# Patient Record
Sex: Male | Born: 1955 | Race: Black or African American | Hispanic: No | Marital: Single | Smoking: Current every day smoker
Health system: Southern US, Community
[De-identification: ages and names within clinical notes are randomized; demographics above are authoritative.]

## PROBLEM LIST (undated history)

## (undated) DIAGNOSIS — I739 Peripheral vascular disease, unspecified: Secondary | ICD-10-CM

## (undated) DIAGNOSIS — G40909 Epilepsy, unspecified, not intractable, without status epilepticus: Secondary | ICD-10-CM

## (undated) DIAGNOSIS — E119 Type 2 diabetes mellitus without complications: Secondary | ICD-10-CM

## (undated) DIAGNOSIS — I639 Cerebral infarction, unspecified: Secondary | ICD-10-CM

## (undated) DIAGNOSIS — I10 Essential (primary) hypertension: Secondary | ICD-10-CM

## (undated) DIAGNOSIS — F0390 Unspecified dementia without behavioral disturbance: Secondary | ICD-10-CM

## (undated) HISTORY — DX: Essential (primary) hypertension: I10

## (undated) HISTORY — PX: TONSILLECTOMY: SUR1361

---

## 2021-08-27 ENCOUNTER — Emergency Department (HOSPITAL_COMMUNITY): Payer: Medicare Other

## 2021-08-27 ENCOUNTER — Other Ambulatory Visit: Payer: Self-pay

## 2021-08-27 ENCOUNTER — Emergency Department (HOSPITAL_COMMUNITY)
Admission: EM | Admit: 2021-08-27 | Discharge: 2021-08-27 | Disposition: A | Discharge status: Home / Self Care | Payer: Medicare Other | Attending: Emergency Medicine | Admitting: Emergency Medicine

## 2021-08-27 ENCOUNTER — Emergency Department (HOSPITAL_COMMUNITY)
Admission: EM | Admit: 2021-08-27 | Discharge: 2021-08-28 | Disposition: A | Discharge status: Nursing Home (Includes ICF) | Payer: Medicare Other | Source: Home / Self Care | Attending: Emergency Medicine | Admitting: Emergency Medicine

## 2021-08-27 ENCOUNTER — Encounter (HOSPITAL_COMMUNITY): Payer: Self-pay | Admitting: Emergency Medicine

## 2021-08-27 DIAGNOSIS — M25552 Pain in left hip: Secondary | ICD-10-CM | POA: Insufficient documentation

## 2021-08-27 DIAGNOSIS — Z7982 Long term (current) use of aspirin: Secondary | ICD-10-CM | POA: Diagnosis not present

## 2021-08-27 DIAGNOSIS — I10 Essential (primary) hypertension: Secondary | ICD-10-CM | POA: Insufficient documentation

## 2021-08-27 DIAGNOSIS — Z79899 Other long term (current) drug therapy: Secondary | ICD-10-CM | POA: Insufficient documentation

## 2021-08-27 DIAGNOSIS — S42292A Other displaced fracture of upper end of left humerus, initial encounter for closed fracture: Secondary | ICD-10-CM | POA: Insufficient documentation

## 2021-08-27 DIAGNOSIS — W19XXXA Unspecified fall, initial encounter: Secondary | ICD-10-CM | POA: Diagnosis not present

## 2021-08-27 DIAGNOSIS — S7002XA Contusion of left hip, initial encounter: Secondary | ICD-10-CM

## 2021-08-27 DIAGNOSIS — S42032A Displaced fracture of lateral end of left clavicle, initial encounter for closed fracture: Secondary | ICD-10-CM | POA: Diagnosis not present

## 2021-08-27 DIAGNOSIS — M25512 Pain in left shoulder: Secondary | ICD-10-CM | POA: Insufficient documentation

## 2021-08-27 DIAGNOSIS — S4992XA Unspecified injury of left shoulder and upper arm, initial encounter: Secondary | ICD-10-CM | POA: Diagnosis present

## 2021-08-27 LAB — CBG MONITORING, ED: Glucose-Capillary: 130 mg/dL — ABNORMAL HIGH (ref 70–99)

## 2021-08-27 MED ORDER — TRAMADOL HCL 50 MG PO TABS
50.0000 mg | ORAL_TABLET | Freq: Once | ORAL | Status: AC
  Administered 2021-08-27: 50 mg via ORAL
  Filled 2021-08-27: qty 1

## 2021-08-27 MED ORDER — METOPROLOL SUCCINATE ER 25 MG PO TB24
25.0000 mg | ORAL_TABLET | Freq: Once | ORAL | Status: AC
  Administered 2021-08-27: 25 mg via ORAL
  Filled 2021-08-27: qty 1

## 2021-08-27 MED ORDER — HYDROCODONE-ACETAMINOPHEN 5-325 MG PO TABS
1.0000 | ORAL_TABLET | Freq: Four times a day (QID) | ORAL | 0 refills | Status: DC | PRN
Start: 2021-08-27 — End: 2021-10-16

## 2021-08-27 NOTE — ED Notes (Signed)
Tried calling [facility] [phone]) to get information about baseline cognitive fxn of pt. No answer or voicemail set up

## 2021-08-27 NOTE — ED Provider Notes (Signed)
[facility] EMERGENCY DEPARTMENT Provider Note   CSN: [id number] Arrival date & time: 08/27/21  2226     History  Chief Complaint  Patient presents with   Leg Pain    Left    Jacob Mcgrath. is a 66 y.o. male.  Patient is a 66 year old male sent from [facility] for evaluation of left groin pain.  Patient seen earlier today for a fall.  He was found to have both a humerus and clavicle fracture.  He was placed in an arm sling and returned.  He apparently began experiencing pain in his left hip and groin this evening and was sent for evaluation of this.  Patient is cognitively impaired and offers little additional history.  The history is provided by the patient.      Home Medications Prior to Admission medications   Medication Sig Start Date End Date Taking? Authorizing Provider  ASPIRIN ADULT LOW STRENGTH 81 MG EC tablet Take 81 mg by mouth daily. 08/14/21   [provider]  atorvastatin (LIPITOR) 80 MG tablet Take 80 mg by mouth daily. 08/14/21   [provider]  HYDROcodone-acetaminophen (NORCO/VICODIN) 5-325 MG tablet Take 1 tablet by mouth every 6 (six) hours as needed for moderate pain. 08/27/21   Vanetta Mulders, MD  metoprolol succinate (TOPROL-XL) 25 MG 24 hr tablet Take 25 mg by mouth daily. 08/14/21   [provider]  MUCINEX MAXIMUM STRENGTH 1200 MG TB12 Take 1 tablet by mouth 2 (two) times daily. 08/14/21   [provider]  Multiple Vitamins-Minerals (THERA-M) TABS Take 1 tablet by mouth daily. 08/14/21   [provider]  omeprazole (PRILOSEC) 20 MG capsule Take 20 mg by mouth daily. 08/14/21   [provider]  PARoxetine (PAXIL) 10 MG tablet Take 10 mg by mouth daily. 08/14/21   [provider]  polyethylene glycol powder (GLYCOLAX/MIRALAX) 17 GM/SCOOP powder Take 17 g by mouth daily. 08/14/21   [provider]  QUEtiapine (SEROQUEL) 25 MG tablet Take 25 mg by mouth at bedtime. 08/14/21    [provider]      Allergies    Patient has no known allergies.    Review of Systems   Review of Systems  All other systems reviewed and are negative.  Physical Exam Updated Vital Signs BP 102/66 (BP Location: Right Arm)    Pulse (!) 127    Temp 98.3 F (36.8 C) (Oral)    Resp 17    Ht 5\' 9"  (1.753 m)    Wt 54.4 kg    SpO2 97%    BMI 17.72 kg/m  Physical Exam Vitals and nursing note reviewed.  Constitutional:      General: He is not in acute distress.    Appearance: He is well-developed. He is not diaphoretic.  HENT:     Head: Normocephalic and atraumatic.  Cardiovascular:     Rate and Rhythm: Normal rate and regular rhythm.     Heart sounds: No murmur heard.   No friction rub.  Pulmonary:     Effort: Pulmonary effort is normal. No respiratory distress.     Breath sounds: Normal breath sounds. No wheezing or rales.  Abdominal:     General: Bowel sounds are normal. There is no distension.     Palpations: Abdomen is soft.     Tenderness: There is no abdominal tenderness.  Musculoskeletal:        General: Normal range of motion.     Cervical back: Normal range  of motion and neck supple.     Comments: The left hip appears grossly normal.  It is stable with good range of motion.  Distal PMS is intact.  Patient able to move all extremities.  Skin:    General: Skin is warm and dry.  Neurological:     Mental Status: He is alert and oriented to person, place, and time.     Coordination: Coordination normal.    ED Results / Procedures / Treatments   Labs (all labs ordered are listed, but only abnormal results are displayed) Labs Reviewed  CBG MONITORING, ED - Abnormal; Notable for the following components:      Result Value   Glucose-Capillary 130 (*)    All other components within normal limits    EKG None  Radiology DG Shoulder Left  Result Date: 08/27/2021 CLINICAL DATA:  66 year old male status post fall. EXAM: LEFT SHOULDER - 2+ VIEW COMPARISON:  None.  FINDINGS: Oblique distal left clavicle fracture, involving the coracoclavicular ligament attachment area but sparing the left AC joint which appears intact. Superimposed comminuted minimally displaced fracture of the left humeral neck. And furthermore, bone mineralization in the proximal left humerus shaft is heterogeneous. No glenohumeral joint dislocation. Left scapula appears intact. Visible left ribs and lung appear negative. Calcified aortic atherosclerosis. IMPRESSION: 1. Left humeral neck fracture, comminuted and minimally displaced, superimposed on heterogeneous bone mineralization of the visible left humerus shaft. A Pathologic Fracture is not excluded. 2. Superimposed oblique fracture distal left clavicle, minimally displaced and involving the coracoclavicular ligament area. Electronically Signed   By: Odessa Fleming M.D.   On: 08/27/2021 09:27    Procedures Procedures    Medications Ordered in ED Medications - No data to display  ED Course/ Medical Decision Making/ A&P  Patient sent from his group home for evaluation of a fall.  He was seen earlier today and was found to have a shoulder fracture.  He is now having pain in the left hip.  X-rays are negative and he has good range of motion.  Patient seems appropriate for discharge.  It sounds as though he has a contusion or sprain of the hip.  Final Clinical Impression(s) / ED Diagnoses Final diagnoses:  None    Rx / DC Orders ED Discharge Orders     None         Geoffery Lyons, MD 08/28/21 [phone]

## 2021-08-27 NOTE — Discharge Instructions (Addendum)
Prepack of hydrocodone given here today.  Take that as needed for pain.  Wear shoulder immobilizer.  Call orthopedics for follow-up of your proximal humerus fracture and your collarbone fracture.

## 2021-08-27 NOTE — ED Notes (Signed)
Pt.'s blood pressure 162/136. Pt states he does take blood pressure medicine, but has not taken it today. MD made aware

## 2021-08-27 NOTE — ED Provider Notes (Signed)
[facility] EMERGENCY DEPARTMENT Provider Note   CSN: [id number] Arrival date & time: 08/27/21  [id number]     History  Chief Complaint  Patient presents with   [name] is a 66 y.o. male.  Patient brought in by EMS from [facility] group home with reports of falling yesterday and today.  Patient with complaint of left shoulder pain.  Denies hitting his head.  No evidence of any trauma to the head no neck pain no back pain no lower extremity pain or concerns for injury.  Patient's past medical history not documented.  Not able to get a good history from him.  But based on his medication probably has hypertension gastroesophageal reflux disease and hyperlipidemia.  Since patient is on a statin he is on Toprol-XL and he is on Prilosec.      Home Medications Prior to Admission medications   Medication Sig Start Date End Date Taking? Authorizing Provider  HYDROcodone-acetaminophen (NORCO/VICODIN) 5-325 MG tablet Take 1 tablet by mouth every 6 (six) hours as needed for moderate pain. 08/27/21  Yes Fredia Sorrow, MD  ASPIRIN ADULT LOW STRENGTH 81 MG EC tablet Take 81 mg by mouth daily. 08/14/21   [provider]  atorvastatin (LIPITOR) 80 MG tablet Take 80 mg by mouth daily. 08/14/21   [provider]  metoprolol succinate (TOPROL-XL) 25 MG 24 hr tablet Take 25 mg by mouth daily. 08/14/21   [provider]  Spicer 1200 MG TB12 Take 1 tablet by mouth 2 (two) times daily. 08/14/21   [provider]  Multiple Vitamins-Minerals (THERA-M) TABS Take 1 tablet by mouth daily. 08/14/21   [provider]  omeprazole (PRILOSEC) 20 MG capsule Take 20 mg by mouth daily. 08/14/21   [provider]  PARoxetine (PAXIL) 10 MG tablet Take 10 mg by mouth daily. 08/14/21   [provider]  polyethylene glycol powder (GLYCOLAX/MIRALAX) 17 GM/SCOOP powder Take 17 g by mouth daily. 08/14/21   [provider]  QUEtiapine  (SEROQUEL) 25 MG tablet Take 25 mg by mouth at bedtime. 08/14/21   [provider]      Allergies    Patient has no known allergies.    Review of Systems   Review of Systems  Constitutional:  Negative for chills and fever.  HENT:  Negative for ear pain and sore throat.   Eyes:  Negative for pain and visual disturbance.  Respiratory:  Negative for cough and shortness of breath.   Cardiovascular:  Negative for chest pain and palpitations.  Gastrointestinal:  Negative for abdominal pain and vomiting.  Genitourinary:  Negative for dysuria and hematuria.  Musculoskeletal:  Negative for arthralgias, back pain and neck pain.  Skin:  Negative for color change and rash.  Neurological:  Negative for seizures, syncope and headaches.  All other systems reviewed and are negative.  Physical Exam Updated Vital Signs BP (!) 157/93    Pulse (!) 101    Temp 98.2 F (36.8 C) (Oral)    Resp 14    SpO2 94%  Physical Exam Vitals and nursing note reviewed.  Constitutional:      General: He is not in acute distress.    Appearance: Normal appearance. He is well-developed.  HENT:     Head: Normocephalic and atraumatic.  Eyes:     Extraocular Movements: Extraocular movements intact.     Conjunctiva/sclera: Conjunctivae normal.     Pupils: Pupils are equal, round, and reactive to light.  Cardiovascular:     Rate and Rhythm: Normal rate and regular rhythm.     Heart sounds: No murmur heard. Pulmonary:     Effort: Pulmonary effort is normal. No respiratory distress.     Breath sounds: Normal breath sounds.  Abdominal:     Palpations: Abdomen is soft.     Tenderness: There is no abdominal tenderness.  Musculoskeletal:        General: Tenderness and signs of injury present. No swelling.     Cervical back: Normal range of motion and neck supple. No rigidity or tenderness.     Comments: Tenderness to palpation to the left clavicle and proximal humerus.  No obvious shoulder deformity.  Distally  radial pulses 1+.  Good cap refill.  Sensation in the fingers intact and able to move his fingers.  Right upper extremity without any pain with range of motion.  And lower extremities without any pain with range of motion.  Low back without any tenderness.  Skin:    General: Skin is warm and dry.     Capillary Refill: Capillary refill takes less than 2 seconds.  Neurological:     Mental Status: He is alert.     Cranial Nerves: No cranial nerve deficit.     Sensory: No sensory deficit.     Motor: No weakness.     Comments: Oriented to where he is from.  Oriented to year.  Psychiatric:        Mood and Affect: Mood normal.    ED Results / Procedures / Treatments   Labs (all labs ordered are listed, but only abnormal results are displayed) Labs Reviewed - No data to display  EKG None  Radiology DG Shoulder Left  Result Date: 08/27/2021 CLINICAL DATA:  66 year old male status post fall. EXAM: LEFT SHOULDER - 2+ VIEW COMPARISON:  None. FINDINGS: Oblique distal left clavicle fracture, involving the coracoclavicular ligament attachment area but sparing the left AC joint which appears intact. Superimposed comminuted minimally displaced fracture of the left humeral neck. And furthermore, bone mineralization in the proximal left humerus shaft is heterogeneous. No glenohumeral joint dislocation. Left scapula appears intact. Visible left ribs and lung appear negative. Calcified aortic atherosclerosis. IMPRESSION: 1. Left humeral neck fracture, comminuted and minimally displaced, superimposed on heterogeneous bone mineralization of the visible left humerus shaft. A Pathologic Fracture is not excluded. 2. Superimposed oblique fracture distal left clavicle, minimally displaced and involving the coracoclavicular ligament area. Electronically Signed   By: Genevie Ann M.D.   On: 08/27/2021 09:27    Procedures Procedures    Medications Ordered in ED Medications  traMADol (ULTRAM) tablet 50 mg (has no  administration in time range)    ED Course/ Medical Decision Making/ A&P                           Medical Decision Making Amount and/or Complexity of Data Reviewed Radiology: ordered.  Risk Prescription drug management.   Suspect isolated injury to the proximal left humerus and left clavicle.  We will treat with shoulder immobilizer.  Hydrocodone for pain.  Tramadol given here so that he does not get too sleepy to be transported back to group home.  And follow-up with Dr. Aline Brochure, orthopedics on-call.  No evidence of any other significant injury.  No evidence of head injury.  Feel patient does not need CT head CT neck.  No evidence of any back tenderness or pain.  No other extremity injury.  Final Clinical Impression(s) / ED Diagnoses Final diagnoses:  Fall, initial encounter  Closed displaced fracture of acromial end of left clavicle, initial encounter  Other closed displaced fracture of proximal end of left humerus, initial encounter    Rx / DC Orders ED Discharge Orders          Ordered    HYDROcodone-acetaminophen (NORCO/VICODIN) 5-325 MG tablet  Every 6 hours PRN        08/27/21 1016              Fredia Sorrow, MD 08/27/21 1022

## 2021-08-27 NOTE — ED Triage Notes (Addendum)
Pt arrives [facility] from [facility] with c/o left leg pain.  Pt was seen in this ED on 08/27/2021 for a fall with fx of left clavicle. Pt denies any falls since seen in our ED.  Pt has AMS. Pt does not remember being in our ED earlier today. Pt is A&O to self but not situation, place or time

## 2021-08-27 NOTE — ED Notes (Signed)
Called [facility] and pt daughter without answer.

## 2021-08-27 NOTE — ED Triage Notes (Signed)
Pt bib ems from [facility] group home with reports of falling yesterday and today and injuring L shouder. CNS intact. VSS with EMS.

## 2021-08-27 NOTE — ED Provider Triage Note (Signed)
Emergency Medicine Provider Triage Evaluation Note  Jacob Mcgrath. , a 65 y.o. male  was evaluated in triage.  Pt complains of left groin pain. Patient was seen this morning for a fall and was diagnosed with a left clavicle fracture. No new falls reported. Patient is oriented to person and year which is similar in presentation to the ED visit this AM.  Review of Systems  Positive: Groin pain, altered mental status Negative: Skin injury, loss of consciousness  Physical Exam  BP 102/66 (BP Location: Right Arm)    Pulse (!) 127    Temp 98.3 F (36.8 C) (Oral)    Resp 17    Ht 5\' 9"  (1.753 m)    Wt 54.4 kg    SpO2 97%    BMI 17.72 kg/m  Gen:   Awake, no distress   Resp:  Normal effort  MSK:   Groin pain on left side with passive ROM Other:    Medical Decision Making  Medically screening exam initiated at 10:56 PM.  Appropriate orders placed.  . was informed that the remainder of the evaluation will be completed by another provider, this initial triage assessment does not replace that evaluation, and the importance of remaining in the ED until their evaluation is complete.     Yvette Rack, Darrick Grinder 08/27/21 2300

## 2021-08-28 NOTE — Discharge Instructions (Signed)
Take ibuprofen 600 mg every 6 hours as needed for pain.  Follow-up with primary doctor if not improving in the next week. 

## 2021-08-28 NOTE — ED Notes (Signed)
Report given to [facility] and pt placed on transport list to be taken back to facility

## 2021-08-31 MED FILL — Hydrocodone-Acetaminophen Tab 5-325 MG: ORAL | Qty: 6 | Status: AC

## 2021-09-03 ENCOUNTER — Other Ambulatory Visit (HOSPITAL_COMMUNITY): Payer: Self-pay | Admitting: Neurology

## 2021-09-03 ENCOUNTER — Other Ambulatory Visit: Payer: Self-pay | Admitting: Neurology

## 2021-09-03 DIAGNOSIS — M4712 Other spondylosis with myelopathy, cervical region: Secondary | ICD-10-CM

## 2021-09-03 DIAGNOSIS — F17210 Nicotine dependence, cigarettes, uncomplicated: Secondary | ICD-10-CM

## 2021-09-07 ENCOUNTER — Ambulatory Visit: Payer: Medicare Other

## 2021-09-07 ENCOUNTER — Encounter: Payer: Self-pay | Admitting: Orthopedic Surgery

## 2021-09-07 ENCOUNTER — Ambulatory Visit (INDEPENDENT_AMBULATORY_CARE_PROVIDER_SITE_OTHER): Payer: Medicare Other | Admitting: Orthopedic Surgery

## 2021-09-07 ENCOUNTER — Other Ambulatory Visit: Payer: Self-pay

## 2021-09-07 VITALS — BP 129/76 | HR 108 | Ht 69.0 in | Wt 134.2 lb

## 2021-09-07 DIAGNOSIS — S42202A Unspecified fracture of upper end of left humerus, initial encounter for closed fracture: Secondary | ICD-10-CM

## 2021-09-07 DIAGNOSIS — S42022A Displaced fracture of shaft of left clavicle, initial encounter for closed fracture: Secondary | ICD-10-CM

## 2021-09-07 DIAGNOSIS — W19XXXA Unspecified fall, initial encounter: Secondary | ICD-10-CM

## 2021-09-07 MED ORDER — TRAMADOL HCL 50 MG PO TABS: 50.0000 mg | ORAL_TABLET | Freq: Two times a day (BID) | ORAL | 0 refills | Status: DC | PRN

## 2021-09-07 NOTE — Patient Instructions (Signed)

## 2021-09-07 NOTE — Progress Notes (Signed)
New Patient Visit  Assessment: Jacob Mcgrath. is a 66 y.o. male with the following: 1. Minimally displaced L clavicle fracture 2. Traumatic closed fx of proximal humerus with minimal displacement, left, initial encounter  Plan: Repeat radiographs stable.  Both fractures in good alignment.  Limited use of left arm prior to fall secondary to a stroke.  Plan for nonoperative management.  Sling adjusted.  NWB.  Tramadol as needed.   Follow up in weeks.    Follow-up: Return in about 2 weeks (around 09/21/2021).  Subjective:  Chief Complaint  Patient presents with   Shoulder Pain    L/shoulder DOI 08/27/21 got up during the night at the facility he lives at and fell.    History of Present Illness: Jacob Mcgrath. is a 66 y.o. male who presents for evaluation of left shoulder pain.  He lives in a facility.  Approximately 10 days ago, he got up in the middle the night and fell.  He landed on his left side.  He noted immediate pain in his left shoulder.  He presented for x-rays, which demonstrated a left clavicle, as well as a proximal humerus fracture.  He has been in a sling ever since.  He has been taking some medications as needed.  Of note, he has previously sustained a stroke, and has limited use of his left side.  Prior to this, he had limited function of his left arm.   Review of Systems: No fevers or chills No chest pain No shortness of breath No bowel or bladder dysfunction No GI distress No headaches   Medical History:  History of a stroke, left sided deficits.  Hyperlipidemia Hypertension GERD  History reviewed. No pertinent surgical history.  History reviewed. No pertinent family history. Social History   Tobacco Use   Smoking status: Some Days    Types: Cigarettes    No Known Allergies  Current Meds  Medication Sig   traMADol (ULTRAM) 50 MG tablet Take 1 tablet (50 mg total) by mouth every 12 (twelve) hours as needed.    Objective: BP 129/76     Pulse (!) 108    Ht 5\' 9"  (1.753 m)    Wt 134 lb 4 oz (60.9 kg)    BMI 19.83 kg/m   Physical Exam:  General: Alert and oriented. and No acute distress. Gait: Walks with the assistance of a cane.  Slow gait  Left upper extremity is in a sling.  Wrist is partially flexed.  Fingers in a slightly flexed position.  Correctable deformity.  Tenderness to palpation over the clavicle.  Tenderness to palpation in the upper arm.  Fingers are warm and well-perfused.    IMAGING: I personally ordered and reviewed the following images  X-ray of the left shoulder was obtained in clinic today.  A distal clavicle shaft fracture is identified.  Minimal displacement.  There has been some interval consolidation.  Injury x-rays.  There is a proximal humerus fracture, with minimal displacement.  Glenohumeral joint is subluxated.  Impression: Minimally displaced left clavicle shaft fracture, with a proximal humerus fracture, both in acceptable alignment.   New Medications:  Meds ordered this encounter  Medications   traMADol (ULTRAM) 50 MG tablet    Sig: Take 1 tablet (50 mg total) by mouth every 12 (twelve) hours as needed.    Dispense:  20 tablet    Refill:  0      , MD  09/07/2021 9:38 PM

## 2021-09-08 ENCOUNTER — Other Ambulatory Visit: Payer: Self-pay | Admitting: Neurology

## 2021-09-08 ENCOUNTER — Other Ambulatory Visit (HOSPITAL_COMMUNITY): Payer: Self-pay | Admitting: Neurology

## 2021-09-08 DIAGNOSIS — M4712 Other spondylosis with myelopathy, cervical region: Secondary | ICD-10-CM

## 2021-09-11 ENCOUNTER — Other Ambulatory Visit (HOSPITAL_COMMUNITY): Payer: Self-pay | Admitting: Neurology

## 2021-09-11 DIAGNOSIS — F17210 Nicotine dependence, cigarettes, uncomplicated: Secondary | ICD-10-CM

## 2021-09-23 ENCOUNTER — Other Ambulatory Visit: Payer: Self-pay

## 2021-09-23 ENCOUNTER — Ambulatory Visit (INDEPENDENT_AMBULATORY_CARE_PROVIDER_SITE_OTHER): Payer: Medicare Other | Admitting: Orthopedic Surgery

## 2021-09-23 ENCOUNTER — Ambulatory Visit: Payer: Medicare Other

## 2021-09-23 ENCOUNTER — Encounter: Payer: Self-pay | Admitting: Orthopedic Surgery

## 2021-09-23 DIAGNOSIS — S42022A Displaced fracture of shaft of left clavicle, initial encounter for closed fracture: Secondary | ICD-10-CM

## 2021-09-23 DIAGNOSIS — S43102D Unspecified dislocation of left acromioclavicular joint, subsequent encounter: Secondary | ICD-10-CM

## 2021-09-23 DIAGNOSIS — S42202D Unspecified fracture of upper end of left humerus, subsequent encounter for fracture with routine healing: Secondary | ICD-10-CM

## 2021-09-23 DIAGNOSIS — S42202A Unspecified fracture of upper end of left humerus, initial encounter for closed fracture: Secondary | ICD-10-CM

## 2021-09-23 MED ORDER — TRAMADOL HCL 50 MG PO TABS: 50.0000 mg | ORAL_TABLET | Freq: Two times a day (BID) | ORAL | 0 refills | Status: DC | PRN

## 2021-09-24 ENCOUNTER — Ambulatory Visit (HOSPITAL_COMMUNITY)
Admission: RE | Admit: 2021-09-24 | Discharge: 2021-09-24 | Disposition: A | Discharge status: Home / Self Care | Payer: Medicare Other | Source: Ambulatory Visit | Attending: Neurology | Admitting: Neurology

## 2021-09-24 DIAGNOSIS — M4712 Other spondylosis with myelopathy, cervical region: Secondary | ICD-10-CM | POA: Insufficient documentation

## 2021-09-24 NOTE — Progress Notes (Signed)
New Patient Visit ? ?Assessment: ?Jacob Mcgrath. is a 66 y.o. male with the following: ?1. Minimally displaced L clavicle fracture ?2.,  Left proximal humerus fracture ? ? ?Plan: ?Repeat radiographs are stable, without further displacement.  His pain is improving.  I recommended he continue to wear the sling at all times, with the exception of hygiene.  I provided him with an updated prescription for tramadol.  We will see him back in approximately 2 weeks.  At that time, I anticipate that we will be able to remove the sling, and allow him to return to his previous level of activity. ? ? ?Follow-up: ?Return in about 2 weeks (around 10/07/2021). ? ?Subjective: ? ?Chief Complaint  ?Patient presents with  ? Shoulder Pain  ?  DOI 08/27/21 ?1. Minimally displaced L clavicle fracture ?2. Traumatic closed fx of proximal humerus with minimal displacement, left, initial encounter ?  ?Staying in the sling.  [facility].   ? ? ?History of Present Illness: ?Jacob Mcgrath. is a 66 y.o. male who returns for evaluation of left shoulder pain.  He sustained a fracture to the distal clavicle, as well as the left proximal humerus.  He has been wearing the sling.  His pain has been controlled with tramadol.  He notes some worsening pain when they remove his sling to bathe him.  Otherwise, he has done well.  No further injuries. ? ?Review of Systems: ?No fevers or chills ?No chest pain ?No shortness of breath ?No bowel or bladder dysfunction ?No GI distress ?No headaches ? ? ? ?Objective: ?There were no vitals taken for this visit. ? ?Physical Exam: ? ?General: Alert and oriented. and No acute distress. ?Gait: Walks with the assistance of a cane.  Slow gait ? ?Left arm in a sling.  Mild tenderness to palpation of the distal clavicle, as well as the proximal humerus.  Fingers are warm and well-perfused.  Motion in his hand is at his baseline. ? ? ? ?IMAGING: ?I personally ordered and reviewed the following  images ? ?X-ray of the left shoulder was obtained in clinic today.  Minimally displaced distal clavicle fracture remains unchanged.  No interval displacement.  No acute injuries are noted.  The proximal humerus fracture is stable.  Glenohumeral joint is reduced.  There has been no interval displacement of the fracture. ? ?Impression: Healing the left clavicle and proximal humerus fractures. ? ?New Medications:  ?Meds ordered this encounter  ?Medications  ? traMADol (ULTRAM) 50 MG tablet  ?  Sig: Take 1 tablet (50 mg total) by mouth every 12 (twelve) hours as needed.  ?  Dispense:  20 tablet  ?  Refill:  0  ? ? ? ? ?Oliver Barre, MD ? ?09/24/2021 ?10:07 AM ? ? ?

## 2021-10-07 ENCOUNTER — Encounter: Payer: Self-pay | Admitting: Orthopedic Surgery

## 2021-10-07 ENCOUNTER — Other Ambulatory Visit: Payer: Self-pay

## 2021-10-07 ENCOUNTER — Ambulatory Visit: Payer: Medicare Other

## 2021-10-07 ENCOUNTER — Ambulatory Visit (INDEPENDENT_AMBULATORY_CARE_PROVIDER_SITE_OTHER): Payer: Medicare Other | Admitting: Orthopedic Surgery

## 2021-10-07 DIAGNOSIS — S42202D Unspecified fracture of upper end of left humerus, subsequent encounter for fracture with routine healing: Secondary | ICD-10-CM

## 2021-10-07 DIAGNOSIS — S43102D Unspecified dislocation of left acromioclavicular joint, subsequent encounter: Secondary | ICD-10-CM

## 2021-10-07 NOTE — Patient Instructions (Signed)
Ok to remove the sling ? ?Start working on range of motion ? ?In 2 weeks, can start lifting with the left arm.  Start slow. ? ?Follow up in 4 weeks for one more evaluation ?

## 2021-10-08 ENCOUNTER — Encounter: Payer: Self-pay | Admitting: Orthopedic Surgery

## 2021-10-08 NOTE — Progress Notes (Signed)
New Patient Visit ? ?Assessment: ?Jacob Cohron. is a 66 y.o. male with the following: ?1. Minimally displaced L clavicle fracture ?2.,  Left proximal humerus fracture ? ? ?Plan: ?Radiographs remain stable.  Pain is improving.  Remove sling and start working on range of motion.  In 2 weeks can start increasing weight bearing and strengthening.  Follow up in 1 month.  ? ? ?Follow-up: ?Return in about 4 weeks (around 11/04/2021). ? ?Subjective: ? ?Chief Complaint  ?Patient presents with  ? Shoulder Pain  ?  Followup left shld fx's.  1. Minimally displaced L clavicle fracture ?2.,  Left proximal humerus fracture  DOI 08/27/21  ? ? ?History of Present Illness: ?Jacob Mcgrath. is a 66 y.o. male who returns for evaluation of left shoulder pain.  He sustained a fracture to the distal clavicle, as well as the left proximal humerus.  Injury was 5-6 weeks ago.  He has remained in the sling.  His pain is improving.  ? ?Review of Systems: ?No fevers or chills ?No chest pain ?No shortness of breath ?No bowel or bladder dysfunction ?No GI distress ?No headaches ? ? ? ?Objective: ?There were no vitals taken for this visit. ? ?Physical Exam: ? ?General: Alert and oriented. and No acute distress. ?Gait: Walks with the assistance of a cane.  Slow gait ? ?Left arm in a sling.  No tenderness to palpation of the distal clavicle, or the proximal humerus.  Fingers are warm and well-perfused.  Motion in his hand is at his baseline. ? ? ? ?IMAGING: ?I personally ordered and reviewed the following images ? ?XR of the left shoulder was obtained in clinic today.  This was compared to prior imaging. There has been no change in the overall alignment of the clavicle and proximal humerus fractures.  There has been interval consolidation.  The glenohumeral joint is reduced.  No acute injury. ? ?Impression: Healing left clavicle and proximal humerus fracture ? ?New Medications:  ?No orders of the defined types were placed in this  encounter. ? ? ? ? ?Oliver Barre, MD ? ?10/08/2021 ?7:21 AM ? ? ?

## 2021-10-16 ENCOUNTER — Other Ambulatory Visit: Payer: Self-pay

## 2021-10-16 MED ORDER — TRAMADOL HCL 50 MG PO TABS: 50.0000 mg | ORAL_TABLET | Freq: Two times a day (BID) | ORAL | 0 refills | Status: DC | PRN

## 2021-10-16 NOTE — Telephone Encounter (Signed)
Jake from [facility] left message stating that the facility this patient is in has requested ?his Tramadol 50 MG be prescribed as 1 tablet by mouth every 12 hours as needed for pain. ?The pharmacy is closed on the weekend and he stated he would like to get this expedited as soon as possible. ?

## 2021-10-21 ENCOUNTER — Ambulatory Visit (HOSPITAL_COMMUNITY)
Admission: RE | Admit: 2021-10-21 | Discharge: 2021-10-21 | Disposition: A | Discharge status: Home / Self Care | Payer: Medicare Other | Source: Ambulatory Visit | Attending: Neurology | Admitting: Neurology

## 2021-10-21 ENCOUNTER — Other Ambulatory Visit: Payer: Self-pay

## 2021-10-21 DIAGNOSIS — F17210 Nicotine dependence, cigarettes, uncomplicated: Secondary | ICD-10-CM | POA: Insufficient documentation

## 2021-11-06 ENCOUNTER — Other Ambulatory Visit: Payer: Self-pay

## 2021-11-06 NOTE — Telephone Encounter (Signed)
Jake w/ [facility] called stating that the facility the patient is at is requesting a refill ? ?Tramadol 50 MG  20 Tablets  ? ?FACILITY USES [facility] ?

## 2021-11-10 ENCOUNTER — Telehealth: Payer: Self-pay

## 2021-11-10 NOTE — Telephone Encounter (Signed)
Jake from [facility] left message that the facility that the patient is in has called for a refill. ?I spoke with Jake on Friday about this and Abigail Butts sent the message to Dr. Amedeo Kinsman and the doctor has ?declined the refill. Patient has an appointment on Wednesday and they can discuss this issue then. ?I called Jake at [facility] and relayed that Dr. Amedeo Kinsman has declined the refill request. ?

## 2021-11-11 ENCOUNTER — Ambulatory Visit (INDEPENDENT_AMBULATORY_CARE_PROVIDER_SITE_OTHER): Payer: Medicare Other | Admitting: Orthopedic Surgery

## 2021-11-11 ENCOUNTER — Ambulatory Visit: Payer: Medicare Other

## 2021-11-11 ENCOUNTER — Encounter: Payer: Self-pay | Admitting: Orthopedic Surgery

## 2021-11-11 VITALS — Ht 69.0 in | Wt 134.0 lb

## 2021-11-11 DIAGNOSIS — S42032D Displaced fracture of lateral end of left clavicle, subsequent encounter for fracture with routine healing: Secondary | ICD-10-CM | POA: Diagnosis not present

## 2021-11-11 DIAGNOSIS — S42202D Unspecified fracture of upper end of left humerus, subsequent encounter for fracture with routine healing: Secondary | ICD-10-CM

## 2021-11-11 DIAGNOSIS — S43102D Unspecified dislocation of left acromioclavicular joint, subsequent encounter: Secondary | ICD-10-CM

## 2021-11-11 NOTE — Progress Notes (Signed)
Return patient Visit ? ?Assessment: ?Jaziel Bennett. is a 66 y.o. male with the following: ?1. Minimally displaced L clavicle fracture ?2.,  Left proximal humerus fracture ? ? ?Plan: ?Radiographs are stable.  His pain has improved.  Sensation is at his baseline.  He no longer requires medications.  He can continue to increase the level of activity, although he has limited function related to other health issues.  No follow-up is needed at this time.  He has any issues, he will contact the clinic. ? ? ?Follow-up: ?Return if symptoms worsen or fail to improve. ? ?Subjective: ? ?Chief Complaint  ?Patient presents with  ? Fracture  ?  Lt clavicle DOI 08/27/21  ? ? ?History of Present Illness: ?Norwin Aleman. is a 66 y.o. male who returns for evaluation of left shoulder pain.  He sustained a fracture to the distal clavicle, as well as the left proximal humerus.  Injury was greater than 2 months ago.  He is no longer using a sling.  He is able to use his shoulder a little bit more, although this is limited secondary to previous stroke.  He notes some decrease sensation in the left upper extremity, which is also at his baseline. ? ?Review of Systems: ?No fevers or chills ?No chest pain ?No shortness of breath ?No bowel or bladder dysfunction ?No GI distress ?No headaches ? ? ? ?Objective: ?Ht 5\' 9"  (1.753 m)   Wt 134 lb (60.8 kg)   BMI 19.79 kg/m?  ? ?Physical Exam: ? ?General: Alert and oriented. and No acute distress. ?Gait: Walks with the assistance of a cane.  Slow gait ? ?Left shoulder without deformity.  No tenderness to palpation along the clavicle.  No tenderness palpation over the proximal humerus.  Limited passive and active range of motion of the left shoulder.  Fingers are warm and well-perfused. ? ? ?IMAGING: ?I personally ordered and reviewed the following images ? ?X-ray left shoulder was obtained in clinic today.  No acute injuries are noted.  Fracture of the distal clavicle remains in good position.   No interval displacement.  Proximal humerus fracture remains well aligned.  No interval displacement.  There has been some interval callus consolidation. ? ?Impression: Healing left clavicle and proximal humerus fractures. ? ?New Medications:  ?No orders of the defined types were placed in this encounter. ? ? ? ? ? , MD ? ?11/11/2021 ?3:18 PM ? ? ?

## 2021-11-27 ENCOUNTER — Telehealth: Payer: Self-pay

## 2021-11-27 NOTE — Telephone Encounter (Signed)
Jake from [facility] called stating that the facility is asking for a D/C on this patient's Tramadol.  ? ? ?Please advise   ?

## 2021-11-27 NOTE — Telephone Encounter (Signed)
D/c'd in chart and verbal order given to [place] at [facility].  ?

## 2022-06-02 ENCOUNTER — Encounter: Payer: Self-pay | Admitting: Neurology

## 2022-06-02 ENCOUNTER — Ambulatory Visit (INDEPENDENT_AMBULATORY_CARE_PROVIDER_SITE_OTHER): Payer: Medicare Other | Admitting: Neurology

## 2022-06-02 VITALS — BP 146/72 | HR 92 | Ht 69.0 in | Wt 132.0 lb

## 2022-06-02 DIAGNOSIS — Z8673 Personal history of transient ischemic attack (TIA), and cerebral infarction without residual deficits: Secondary | ICD-10-CM

## 2022-06-02 DIAGNOSIS — Z87898 Personal history of other specified conditions: Secondary | ICD-10-CM | POA: Diagnosis not present

## 2022-06-02 NOTE — Patient Instructions (Signed)
Good to meet you! Continue all your medications. Continue aspirin, control of blood pressure, cholesterol, sugar levels. Follow-up as needed. Continue PCP follow-up. Call for any changes

## 2022-06-02 NOTE — Progress Notes (Unsigned)
NEUROLOGY CONSULTATION NOTE  Jacob Mcgrath. MRN: [id number] DOB: 1955/08/28  Referring provider: Dr. Benetta Mcgrath Primary care provider: Dr. Benetta Mcgrath  Reason for consult:  Jacob Mcgrath office closing, transfer of care  Dear Jacob Mcgrath:  Thank you for your kind referral of Jacob Mcgrath. for consultation of the above symptoms. Although his history is well known to you, please allow me to reiterate it for the purpose of our medical record. The patient was accompanied to the clinic by his group home manager at [facility], [facility], who also provides collateral information. Records and images were personally reviewed where available.   HISTORY OF PRESENT ILLNESS: This is a pleasant 66 year old right-handed man with a history of hypertension, prior stroke, and report of seizures, presenting for evaluation. He was under neurologist Jacob Mcgrath care from February 2023 to September 2023. He has been living at his current group home for almost a year. Jacob Mcgrath does not know much about his prior history, he is a poor historian. 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 Prior to this, he was living alone and functional. There was also note of a history of seizures but there is no information about this, he and staff are not aware of seizures. 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 MRI cervical spine was ordered and scan in 09/2021 showed degenerative changes with mild spinal canal stenosis at C5-6, C6-7, multilevel neural foraminal narrowing, severe bilaterally at C5-6. Per notes from April 2023, he is on Keppra for seizures. It is listed on his medication list from last visit to Jacob Mcgrath in 03/2022. Today, Jacob Mcgrath brings his medication list and Duloxetine and Keppra are not on his list. He is on a daily aspirin. BP today  146/72.  Jacob Mcgrath has not witnessed any seizure or seizure-like activity since moving to the home. No staring/unresponsive episodes, myoclonic jerks. He reports the left arm and leg were affected by the stroke, he denies any jerking, focal numbness. He denies any headaches, dizziness, diplopia, dysarthria/dysphagia. He denies any pain. They state it has been a while since he had any falls. He is sleeping well. Jacob Mcgrath denies any behavioral concerns. Staff administers his medications. He is slow to respond to questions but answers them appropriately.   PAST MEDICAL HISTORY: Past Medical History:  Diagnosis Date   Hypertension     PAST SURGICAL HISTORY: Past Surgical History:  Procedure Laterality Date   TONSILLECTOMY      MEDICATIONS: Current Outpatient Medications on File Prior to Visit  Medication Sig Dispense Refill   ASPIRIN ADULT LOW STRENGTH 81 MG EC tablet Take 81 mg by mouth daily.     atorvastatin (LIPITOR) 80 MG tablet Take 80 mg by mouth daily.     metoprolol succinate (TOPROL-XL) 25 MG 24 hr tablet Take 25 mg by mouth daily.     MUCINEX MAXIMUM STRENGTH 1200 MG TB12 Take 1 tablet by mouth 2 (two) times daily.     Multiple Vitamins-Minerals (THERA-M) TABS Take 1 tablet by mouth daily.     omeprazole (PRILOSEC) 20 MG capsule Take 20 mg by mouth daily.     PARoxetine (PAXIL) 10 MG tablet Take 10 mg by mouth daily.     polyethylene glycol powder (GLYCOLAX/MIRALAX) 17 GM/SCOOP powder Take 17 g by mouth daily.     QUEtiapine (  SEROQUEL) 25 MG tablet Take 25 mg by mouth at bedtime.     traMADol (ULTRAM) 50 MG tablet Take 50 mg by mouth every 12 (twelve) hours as needed.     No current facility-administered medications on file prior to visit.    ALLERGIES: No Known Allergies  FAMILY HISTORY: History reviewed. No pertinent family history.  SOCIAL HISTORY: Social History   Socioeconomic History   Marital status: Single    Spouse name: Not on file   Number of children: Not on file    Years of education: Not on file   Highest education level: Not on file  Occupational History   Not on file  Tobacco Use   Smoking status: Some Days    Types: Cigarettes   Smokeless tobacco: Not on file  Vaping Use   Vaping Use: Never used  Substance and Sexual Activity   Alcohol use: Not Currently   Drug use: Not Currently   Sexual activity: Not on file  Other Topics Concern   Not on file  Social History Narrative   Are you right handed or left handed? Right handed    Do you live at home alone? Lives in family care home           Social Determinants of Health   Financial Resource Strain: Not on file  Food Insecurity: Not on file  Transportation Needs: Not on file  Physical Activity: Not on file  Stress: Not on file  Social Connections: Not on file  Intimate Partner Violence: Not on file     PHYSICAL EXAM: Vitals:   06/02/22 1006  BP: (!) 146/72  Pulse: 92  SpO2: 98%   General: No acute distress Head:  Normocephalic/atraumatic Skin/Extremities: No rash, no edema Neurological Exam: Mental status: alert and oriented to person, place, no dysarthria or aphasia, Fund of knowledge is reduced.  Recent and remote memory are impaired, 0/3 delayed recall.  Attention and concentration are reduced, difficulty spelling [facility]").  Cranial nerves: CN I: not tested CN II: pupils equal, round, visual fields intact CN III, IV, VI:  full range of motion, no nystagmus, no ptosis CN V: facial sensation intact CN VII: upper and lower face symmetric CN VIII: hearing intact to conversation Bulk & Tone: tone increased on left UE with contracture of fingers on left hand Motor: 5/5 on right UE and LE, 4-/5 proximal left UE, contracture on left hand. 5/5 left LE. Sensation: intact to light touch, cold.  No extinction to double simultaneous stimulation.  Romberg test negative Deep Tendon Reflexes: +2 throughout Cerebellar: no incoordination on finger to nose, heel to shin. No  dysdiadochokinesia Gait: slow and cautious, no ataxia Tremor: none   IMPRESSION: This is a pleasant 67 year old right-handed man with a history of hypertension, prior stroke, and report of seizures, presenting for evaluation. He was seen by neurologist Jacob Mcgrath for central pain syndrome and history of seizures, records reviewed, patient is no longer taking medications prescribed for these (Duloxetine and Levetiracetam). Group home has not witnessed any seizures or seizure-like symptoms in almost a year, he is not reporting any pain, no clear indication to restart medication at this time. For secondary stroke prevention, continue daily aspirin, control of vascular risk factors with PCP. Follow-up as needed, call for any changes.    Thank you for allowing me to participate in the care of this patient. Please do not hesitate to call for any questions or concerns.   Patrcia Dolly, M.D.  CC:  Jacob. Felecia Mcgrath

## 2022-07-27 ENCOUNTER — Encounter (HOSPITAL_COMMUNITY): Payer: Self-pay | Admitting: Emergency Medicine

## 2022-07-27 ENCOUNTER — Emergency Department (HOSPITAL_COMMUNITY)
Admission: EM | Admit: 2022-07-27 | Discharge: 2022-07-27 | Disposition: A | Discharge status: Home / Self Care | Payer: Medicare Other | Attending: Emergency Medicine | Admitting: Emergency Medicine

## 2022-07-27 ENCOUNTER — Other Ambulatory Visit: Payer: Self-pay

## 2022-07-27 DIAGNOSIS — N3091 Cystitis, unspecified with hematuria: Secondary | ICD-10-CM | POA: Insufficient documentation

## 2022-07-27 DIAGNOSIS — Z79899 Other long term (current) drug therapy: Secondary | ICD-10-CM | POA: Diagnosis not present

## 2022-07-27 DIAGNOSIS — E119 Type 2 diabetes mellitus without complications: Secondary | ICD-10-CM | POA: Insufficient documentation

## 2022-07-27 DIAGNOSIS — Z7982 Long term (current) use of aspirin: Secondary | ICD-10-CM | POA: Diagnosis not present

## 2022-07-27 DIAGNOSIS — I10 Essential (primary) hypertension: Secondary | ICD-10-CM | POA: Insufficient documentation

## 2022-07-27 DIAGNOSIS — Z8673 Personal history of transient ischemic attack (TIA), and cerebral infarction without residual deficits: Secondary | ICD-10-CM | POA: Diagnosis not present

## 2022-07-27 DIAGNOSIS — R319 Hematuria, unspecified: Secondary | ICD-10-CM | POA: Diagnosis present

## 2022-07-27 LAB — COMPREHENSIVE METABOLIC PANEL
ALT: 19 U/L (ref 0–44)
AST: 21 U/L (ref 15–41)
Albumin: 4.1 g/dL (ref 3.5–5.0)
Alkaline Phosphatase: 76 U/L (ref 38–126)
Anion gap: 11 (ref 5–15)
BUN: 25 mg/dL — ABNORMAL HIGH (ref 8–23)
CO2: 26 mmol/L (ref 22–32)
Calcium: 9.8 mg/dL (ref 8.9–10.3)
Chloride: 107 mmol/L (ref 98–111)
Creatinine, Ser: 0.87 mg/dL (ref 0.61–1.24)
GFR, Estimated: >60 mL/min (ref >60)
Glucose, Bld: 72 mg/dL (ref 70–99)
Potassium: 3.9 mmol/L (ref 3.5–5.1)
Sodium: 144 mmol/L (ref 135–145)
Total Bilirubin: 0.5 mg/dL (ref 0.3–1.2)
Total Protein: 8.1 g/dL (ref 6.5–8.1)

## 2022-07-27 LAB — CBC WITH DIFFERENTIAL/PLATELET
Abs Immature Granulocytes: 0.02 10*3/uL (ref 0.00–0.07)
Basophils Absolute: 0 10*3/uL (ref 0.0–0.1)
Basophils Relative: 0 %
Eosinophils Absolute: 0.4 10*3/uL (ref 0.0–0.5)
Eosinophils Relative: 4 %
HCT: 40.9 % (ref 39.0–52.0)
Hemoglobin: 12.6 g/dL — ABNORMAL LOW (ref 13.0–17.0)
Immature Granulocytes: 0 %
Lymphocytes Relative: 35 %
Lymphs Abs: 3.4 10*3/uL (ref 0.7–4.0)
MCH: 29.3 pg (ref 26.0–34.0)
MCHC: 30.8 g/dL (ref 30.0–36.0)
MCV: 95.1 fL (ref 80.0–100.0)
Monocytes Absolute: 1 10*3/uL (ref 0.1–1.0)
Monocytes Relative: 10 %
Neutro Abs: 4.8 10*3/uL (ref 1.7–7.7)
Neutrophils Relative %: 51 %
Platelets: 368 10*3/uL (ref 150–400)
RBC: 4.3 MIL/uL (ref 4.22–5.81)
RDW: 13.1 % (ref 11.5–15.5)
WBC: 9.6 10*3/uL (ref 4.0–10.5)
nRBC: 0 % (ref 0.0–0.2)

## 2022-07-27 LAB — URINALYSIS, ROUTINE W REFLEX MICROSCOPIC
Bilirubin Urine: NEGATIVE
Glucose, UA: NEGATIVE mg/dL
Ketones, ur: NEGATIVE mg/dL
Nitrite: NEGATIVE
Protein, ur: 100 mg/dL — AB
RBC / HPF: >50 RBC/hpf — ABNORMAL HIGH (ref 0–5)
Specific Gravity, Urine: 1.014 (ref 1.005–1.030)
WBC, UA: >50 WBC/hpf — ABNORMAL HIGH (ref 0–5)
pH: 6 (ref 5.0–8.0)

## 2022-07-27 MED ORDER — CIPROFLOXACIN HCL 500 MG PO TABS: 500.0000 mg | ORAL_TABLET | Freq: Two times a day (BID) | ORAL | 0 refills | Status: DC

## 2022-07-27 MED ORDER — SODIUM CHLORIDE 0.9 % IV SOLN
1.0000 g | Freq: Once | INTRAVENOUS | Status: AC
  Administered 2022-07-27: 1 g via INTRAVENOUS
  Filled 2022-07-27: qty 10

## 2022-07-27 MED ORDER — LIDOCAINE HCL (PF) 2 % IJ SOLN
INTRAMUSCULAR | Status: AC
  Filled 2022-07-27: qty 10

## 2022-07-27 MED ORDER — CEFTRIAXONE SODIUM 1 G IJ SOLR
1.0000 g | Freq: Once | INTRAMUSCULAR | Status: AC
  Administered 2022-07-27: 1 g via INTRAMUSCULAR
  Filled 2022-07-27: qty 10

## 2022-07-27 MED ORDER — LIDOCAINE HCL (PF) 1 % IJ SOLN
INTRAMUSCULAR | Status: AC
  Administered 2022-07-27: 2.1 mL
  Filled 2022-07-27: qty 5

## 2022-07-27 MED ORDER — CEPHALEXIN 500 MG PO CAPS: 500.0000 mg | ORAL_CAPSULE | Freq: Four times a day (QID) | ORAL | 0 refills | Status: DC

## 2022-07-27 NOTE — ED Notes (Signed)
Another RN to attempt IV access

## 2022-07-27 NOTE — ED Provider Notes (Signed)
[facility] EMERGENCY DEPARTMENT Provider Note   CSN: [id number] Arrival date & time: 07/27/22  5465     History {Add pertinent medical, surgical, social history, OB history to HPI:1} No chief complaint on file.   Jacob Prewitt. is a 67 y.o. male.  HPI     Jacob Mcgrath. is a 67 y.o. male with diabetes, hypertension, prior stroke with some residual left-sided weakness and report of seizures.  He is currently resides at [facility] family group home who presents to the Emergency Department for evaluation of bright red blood in his urine this morning.  Patient notes some urinary hesitancy, but denies any flank abdominal pain, or pain with urination.  He states his urinary hesitancy has been mild.  He is also requesting evaluation for a "sore" to the heel of his left foot.  Has been there for some time.  Denies known injury.  No fever, chills, nausea, or vomiting.    Home Medications Prior to Admission medications   Medication Sig Start Date End Date Taking? Authorizing Provider  ASPIRIN ADULT LOW STRENGTH 81 MG EC tablet Take 81 mg by mouth daily. 08/14/21   [provider]  atorvastatin (LIPITOR) 80 MG tablet Take 80 mg by mouth daily. 08/14/21   [provider]  metoprolol succinate (TOPROL-XL) 25 MG 24 hr tablet Take 25 mg by mouth daily. 08/14/21   [provider]  [place] 1200 MG TB12 Take 1 tablet by mouth 2 (two) times daily. 08/14/21   [provider]  Multiple Vitamins-Minerals (THERA-M) TABS Take 1 tablet by mouth daily. 08/14/21   [provider]  omeprazole (PRILOSEC) 20 MG capsule Take 20 mg by mouth daily. 08/14/21   [provider]  PARoxetine (PAXIL) 10 MG tablet Take 10 mg by mouth daily. 08/14/21   [provider]  polyethylene glycol powder (GLYCOLAX/MIRALAX) 17 GM/SCOOP powder Take 17 g by mouth daily. 08/14/21   [provider]  QUEtiapine (SEROQUEL) 25 MG tablet Take 25 mg by  mouth at bedtime. 08/14/21   [provider]  traMADol (ULTRAM) 50 MG tablet Take 50 mg by mouth every 12 (twelve) hours as needed.    [provider]      Allergies    Patient has no known allergies.    Review of Systems   Review of Systems  Constitutional:  Negative for appetite change, chills and fever.  Respiratory:  Negative for chest tightness and shortness of breath.   Cardiovascular:  Negative for chest pain.  Gastrointestinal:  Negative for abdominal pain, nausea and vomiting.  Genitourinary:  Positive for difficulty urinating and hematuria. Negative for decreased urine volume, dysuria, flank pain, penile pain, penile swelling, scrotal swelling and testicular pain.  Musculoskeletal:  Negative for back pain.  Skin:        "Sore" to heel of left foot.  Neurological:  Negative for dizziness.  Psychiatric/Behavioral:  Negative for confusion.     Physical Exam Updated Vital Signs BP (!) 143/67 (BP Location: Right Arm)   Pulse 88   Temp 97.7 F (36.5 C) (Oral)   Resp 16   Ht 5\' 9"  (1.753 m)   Wt 59.9 kg   SpO2 100%   BMI 19.50 kg/m  Physical Exam Vitals and nursing note reviewed.  Constitutional:      General: He is not in acute distress.    Appearance: Normal appearance. He is not ill-appearing or toxic-appearing.  Cardiovascular:     Rate and Rhythm: Normal  rate and regular rhythm.     Pulses: Normal pulses.  Pulmonary:     Effort: Pulmonary effort is normal.     Breath sounds: Normal breath sounds.  Abdominal:     Palpations: Abdomen is soft.     Tenderness: There is no abdominal tenderness. There is no right CVA tenderness or left CVA tenderness.  Musculoskeletal:        General: Tenderness present.     Right lower leg: No edema.     Left lower leg: No edema.     Comments: Xerosis of bilateral feet.  There is a small skin tear to the heel of the left foot.  There is no surrounding erythema, induration or drainage.  Skin:    General: Skin is  warm.     Capillary Refill: Capillary refill takes less than 2 seconds.     Findings: No erythema.  Neurological:     General: No focal deficit present.     Mental Status: He is alert.     Sensory: No sensory deficit.     Motor: No weakness.    ED Results / Procedures / Treatments   Labs (all labs ordered are listed, but only abnormal results are displayed) Labs Reviewed  CBC WITH DIFFERENTIAL/PLATELET  COMPREHENSIVE METABOLIC PANEL  URINALYSIS, ROUTINE W REFLEX MICROSCOPIC    EKG None  Radiology No results found.  Procedures Procedures  {Document cardiac monitor, telemetry assessment procedure when appropriate:1}  Medications Ordered in ED Medications - No data to display  ED Course/ Medical Decision Making/ A&P                           Medical Decision Making Patient here from local group home sent for evaluation of hematuria this morning.  Patient denies any flank or abdominal pain.  No reported history of kidney stones.  On my exam, patient well-appearing nontoxic.  Vital signs reviewed.  No CVA tenderness on my exam.  Pharyngeal would include but not limited to kidney stone, pyelonephritis, UTI, enlarged prostate, neoplasm.  He will need further evaluation with CT imaging and labs including urinalysis.  If CT equivocal, patient may benefit from renal ultrasound.  Amount and/or Complexity of Data Reviewed Labs: ordered. Discussion of management or test interpretation with external provider(s): Patient here with likely hemorrhagic cystitis, urine culture pending.  Given Rocephin here along with prescription for Keflex.  I feel he is appropriate for discharge back to group home he is agreeable to this plan.  After electronic prescription for the Keflex, I was contacted by the pharmacy listed stating that the patient had an ampicillin allergy.  I reviewed his medical records from today and as far back as February 2023 with out any known drug allergies listed.  Patient  had medication list brought him with him that did not have any drug allergies listed.  Patient's prescription for Keflex was discontinued verbally with the pharmacy on file and prescription written for Cipro.  Risk Prescription drug management.     {Document critical care time when appropriate:1} {Document review of labs and clinical decision tools ie heart score, Chads2Vasc2 etc:1}  {Document your independent review of radiology images, and any outside records:1} {Document your discussion with family members, caretakers, and with consultants:1} {Document social determinants of health affecting pt's care:1} {Document your decision making why or why not admission, treatments were needed:1} Final Clinical Impression(s) / ED Diagnoses Final diagnoses:  None    Rx / DC  Orders ED Discharge Orders     None

## 2022-07-27 NOTE — ED Triage Notes (Signed)
Pt arrived via [facility] from [place] with c/o bright red blood in urine and bladder pressure. Also c/o sore on L foot. Hx of T2D

## 2022-07-27 NOTE — ED Notes (Signed)
Bladder scanner revealed 349mL of urine in bladder--PA-C made aware

## 2022-07-27 NOTE — Discharge Instructions (Signed)
Your workup today shows that you have a urinary tract infection.  This may be causing you to see blood in your urine.  It is important that you take the antibiotic as directed until finished.  Drink plenty of water.  Also, I recommend that you follow-up with urology for recheck.  I have listed a local urologist on your paperwork.  Please return to the emergency department for any new or worsening symptoms.

## 2022-07-27 NOTE — ED Notes (Signed)
Changed and cleaned pt . Pt had wet his pants when pt used the urinal. Changed and cleaned sheets as well.

## 2022-07-29 LAB — URINE CULTURE: Culture: >=100000 — AB

## 2022-07-30 ENCOUNTER — Telehealth (HOSPITAL_BASED_OUTPATIENT_CLINIC_OR_DEPARTMENT_OTHER): Payer: Self-pay | Admitting: *Deleted

## 2022-07-30 NOTE — Telephone Encounter (Signed)
Post ED Visit - Positive Culture Follow-up  Culture report reviewed by antimicrobial stewardship pharmacist: [place] Team []  Elenor Quinones, Pharm.D. []  Heide Guile, Pharm.D., BCPS AQ-ID []  Parks Neptune, Pharm.D., BCPS []  Alycia Rossetti, Pharm.D., BCPS []  [place], Pharm.D., BCPS, AAHIVP []  Legrand Como, Pharm.D., BCPS, AAHIVP []  Salome Arnt, PharmD, BCPS []  Johnnette Gourd, PharmD, BCPS []  Hughes Better, PharmD, BCPS []  Leeroy Cha, PharmD []  Laqueta Linden, PharmD, BCPS []  Albertina Parr, PharmD  [place] Team []  Leodis Sias, PharmD []  Lindell Spar, PharmD []  Royetta Asal, PharmD []  Graylin Shiver, Rph []  Rema Fendt) Glennon Mac, PharmD []  Arlyn Dunning, PharmD []  Netta Cedars, PharmD []  Dia Sitter, PharmD []  Leone Haven, PharmD []  Gretta Arab, PharmD []  Theodis Shove, PharmD []  Peggyann Juba, PharmD []  Reuel Boom, PharmD   Positive urine culture Treated with Cephalexin, organism sensitive to the same and no further patient follow-up is required at this time. Reviewed by Pharmacy  Ardeen Fillers 07/30/2022, 4:55 PM

## 2022-08-10 ENCOUNTER — Other Ambulatory Visit (HOSPITAL_COMMUNITY): Payer: Self-pay | Admitting: Gerontology

## 2022-08-10 ENCOUNTER — Ambulatory Visit (HOSPITAL_COMMUNITY)
Admission: RE | Admit: 2022-08-10 | Discharge: 2022-08-10 | Disposition: A | Discharge status: Home / Self Care | Payer: Medicare Other | Source: Ambulatory Visit | Attending: Gerontology | Admitting: Gerontology

## 2022-08-10 DIAGNOSIS — M79672 Pain in left foot: Secondary | ICD-10-CM

## 2022-09-20 ENCOUNTER — Encounter (HOSPITAL_COMMUNITY): Payer: Self-pay | Admitting: Emergency Medicine

## 2022-09-20 ENCOUNTER — Other Ambulatory Visit: Payer: Self-pay

## 2022-09-20 ENCOUNTER — Emergency Department (HOSPITAL_COMMUNITY): Payer: Medicare Other

## 2022-09-20 ENCOUNTER — Emergency Department (HOSPITAL_COMMUNITY)
Admission: EM | Admit: 2022-09-20 | Discharge: 2022-09-20 | Disposition: A | Discharge status: Skilled Nursing Facility | Payer: Medicare Other | Attending: Emergency Medicine | Admitting: Emergency Medicine

## 2022-09-20 DIAGNOSIS — S99921A Unspecified injury of right foot, initial encounter: Secondary | ICD-10-CM | POA: Diagnosis present

## 2022-09-20 DIAGNOSIS — Y92410 Unspecified street and highway as the place of occurrence of the external cause: Secondary | ICD-10-CM | POA: Diagnosis not present

## 2022-09-20 DIAGNOSIS — Z7982 Long term (current) use of aspirin: Secondary | ICD-10-CM | POA: Diagnosis not present

## 2022-09-20 NOTE — ED Notes (Signed)
Patient transported to X-ray 
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
## 2022-09-20 NOTE — Discharge Instructions (Signed)
You are seen today in the emergency department for foot pain.  The x-ray did not show any breaks, take Tylenol as needed for pain.  Ice, elevate.  Change the dressing on the foot every other day, please call your primary to be seen in the next few days for reevaluation.  Return to the emergency department for fever, shortness of breath, new or concerning symptoms.
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
## 2022-09-20 NOTE — ED Notes (Signed)
Pt. returned from XR. 

## 2022-10-01 ENCOUNTER — Inpatient Hospital Stay (HOSPITAL_COMMUNITY)
Admission: EM | Admit: 2022-10-01 | Discharge: 2022-10-11 | DRG: 252 | Disposition: A | Discharge status: Skilled Nursing Facility | Payer: Medicare Other | Attending: Internal Medicine | Admitting: Internal Medicine

## 2022-10-01 ENCOUNTER — Emergency Department (HOSPITAL_COMMUNITY): Payer: Medicare Other

## 2022-10-01 ENCOUNTER — Encounter (HOSPITAL_COMMUNITY): Payer: Self-pay | Admitting: Family Medicine

## 2022-10-01 DIAGNOSIS — Z7984 Long term (current) use of oral hypoglycemic drugs: Secondary | ICD-10-CM

## 2022-10-01 DIAGNOSIS — I70222 Atherosclerosis of native arteries of extremities with rest pain, left leg: Secondary | ICD-10-CM | POA: Diagnosis not present

## 2022-10-01 DIAGNOSIS — L97929 Non-pressure chronic ulcer of unspecified part of left lower leg with unspecified severity: Secondary | ICD-10-CM | POA: Diagnosis not present

## 2022-10-01 DIAGNOSIS — E119 Type 2 diabetes mellitus without complications: Secondary | ICD-10-CM

## 2022-10-01 DIAGNOSIS — F39 Unspecified mood [affective] disorder: Secondary | ICD-10-CM | POA: Diagnosis present

## 2022-10-01 DIAGNOSIS — I998 Other disorder of circulatory system: Secondary | ICD-10-CM | POA: Diagnosis present

## 2022-10-01 DIAGNOSIS — E1151 Type 2 diabetes mellitus with diabetic peripheral angiopathy without gangrene: Principal | ICD-10-CM | POA: Diagnosis present

## 2022-10-01 DIAGNOSIS — E11622 Type 2 diabetes mellitus with other skin ulcer: Secondary | ICD-10-CM | POA: Insufficient documentation

## 2022-10-01 DIAGNOSIS — E1140 Type 2 diabetes mellitus with diabetic neuropathy, unspecified: Secondary | ICD-10-CM | POA: Diagnosis not present

## 2022-10-01 DIAGNOSIS — E785 Hyperlipidemia, unspecified: Secondary | ICD-10-CM | POA: Diagnosis present

## 2022-10-01 DIAGNOSIS — I959 Hypotension, unspecified: Secondary | ICD-10-CM | POA: Diagnosis not present

## 2022-10-01 DIAGNOSIS — R339 Retention of urine, unspecified: Secondary | ICD-10-CM | POA: Diagnosis not present

## 2022-10-01 DIAGNOSIS — I70245 Atherosclerosis of native arteries of left leg with ulceration of other part of foot: Secondary | ICD-10-CM | POA: Diagnosis not present

## 2022-10-01 DIAGNOSIS — L03116 Cellulitis of left lower limb: Secondary | ICD-10-CM | POA: Diagnosis not present

## 2022-10-01 DIAGNOSIS — G40909 Epilepsy, unspecified, not intractable, without status epilepticus: Secondary | ICD-10-CM | POA: Diagnosis present

## 2022-10-01 DIAGNOSIS — I10 Essential (primary) hypertension: Secondary | ICD-10-CM

## 2022-10-01 DIAGNOSIS — E11649 Type 2 diabetes mellitus with hypoglycemia without coma: Secondary | ICD-10-CM | POA: Diagnosis not present

## 2022-10-01 DIAGNOSIS — L97909 Non-pressure chronic ulcer of unspecified part of unspecified lower leg with unspecified severity: Secondary | ICD-10-CM | POA: Diagnosis not present

## 2022-10-01 DIAGNOSIS — E43 Unspecified severe protein-calorie malnutrition: Secondary | ICD-10-CM | POA: Insufficient documentation

## 2022-10-01 DIAGNOSIS — W230XXA Caught, crushed, jammed, or pinched between moving objects, initial encounter: Secondary | ICD-10-CM | POA: Diagnosis present

## 2022-10-01 DIAGNOSIS — F17210 Nicotine dependence, cigarettes, uncomplicated: Secondary | ICD-10-CM | POA: Diagnosis present

## 2022-10-01 DIAGNOSIS — Z681 Body mass index (BMI) 19 or less, adult: Secondary | ICD-10-CM | POA: Diagnosis not present

## 2022-10-01 DIAGNOSIS — Z7982 Long term (current) use of aspirin: Secondary | ICD-10-CM | POA: Diagnosis not present

## 2022-10-01 DIAGNOSIS — Z880 Allergy status to penicillin: Secondary | ICD-10-CM | POA: Diagnosis not present

## 2022-10-01 DIAGNOSIS — Z79899 Other long term (current) drug therapy: Secondary | ICD-10-CM | POA: Diagnosis not present

## 2022-10-01 HISTORY — DX: Type 2 diabetes mellitus without complications: E11.9

## 2022-10-01 LAB — CBC WITH DIFFERENTIAL/PLATELET
Abs Immature Granulocytes: 0.05 10*3/uL (ref 0.00–0.07)
Basophils Absolute: 0 10*3/uL (ref 0.0–0.1)
Basophils Relative: 0 %
Eosinophils Absolute: 0.3 10*3/uL (ref 0.0–0.5)
Eosinophils Relative: 2 %
HCT: 31.8 % — ABNORMAL LOW (ref 39.0–52.0)
Hemoglobin: 9.9 g/dL — ABNORMAL LOW (ref 13.0–17.0)
Immature Granulocytes: 0 %
Lymphocytes Relative: 24 %
Lymphs Abs: 3.2 10*3/uL (ref 0.7–4.0)
MCH: 29.4 pg (ref 26.0–34.0)
MCHC: 31.1 g/dL (ref 30.0–36.0)
MCV: 94.4 fL (ref 80.0–100.0)
Monocytes Absolute: 1.6 10*3/uL — ABNORMAL HIGH (ref 0.1–1.0)
Monocytes Relative: 12 %
Neutro Abs: 8.6 10*3/uL — ABNORMAL HIGH (ref 1.7–7.7)
Neutrophils Relative %: 62 %
Platelets: 430 10*3/uL — ABNORMAL HIGH (ref 150–400)
RBC: 3.37 MIL/uL — ABNORMAL LOW (ref 4.22–5.81)
RDW: 12.5 % (ref 11.5–15.5)
WBC: 13.8 10*3/uL — ABNORMAL HIGH (ref 4.0–10.5)
nRBC: 0 % (ref 0.0–0.2)

## 2022-10-01 LAB — BASIC METABOLIC PANEL
Anion gap: 6 (ref 5–15)
BUN: 20 mg/dL (ref 8–23)
CO2: 27 mmol/L (ref 22–32)
Calcium: 9.2 mg/dL (ref 8.9–10.3)
Chloride: 105 mmol/L (ref 98–111)
Creatinine, Ser: 0.78 mg/dL (ref 0.61–1.24)
GFR, Estimated: >60 mL/min (ref >60)
Glucose, Bld: 116 mg/dL — ABNORMAL HIGH (ref 70–99)
Potassium: 4.4 mmol/L (ref 3.5–5.1)
Sodium: 138 mmol/L (ref 135–145)

## 2022-10-01 LAB — LACTIC ACID, PLASMA
Lactic Acid, Venous: 2.6 mmol/L (ref 0.5–1.9)
Lactic Acid, Venous: 1 mmol/L (ref 0.5–1.9)

## 2022-10-01 MED ORDER — QUETIAPINE FUMARATE 25 MG PO TABS
25.0000 mg | ORAL_TABLET | Freq: Every day | ORAL | Status: DC
  Administered 2022-10-02 – 2022-10-03 (×3): 25 mg via ORAL
  Filled 2022-10-01 (×3): qty 1

## 2022-10-01 MED ORDER — ATORVASTATIN CALCIUM 80 MG PO TABS
80.0000 mg | ORAL_TABLET | Freq: Every day | ORAL | Status: DC
  Administered 2022-10-02 – 2022-10-11 (×9): 80 mg via ORAL
  Filled 2022-10-01 (×9): qty 1

## 2022-10-01 MED ORDER — ASPIRIN 81 MG PO TBEC
81.0000 mg | DELAYED_RELEASE_TABLET | Freq: Every day | ORAL | Status: DC
  Administered 2022-10-02 – 2022-10-11 (×10): 81 mg via ORAL
  Filled 2022-10-01 (×10): qty 1

## 2022-10-01 MED ORDER — ONDANSETRON HCL 4 MG PO TABS: 4.0000 mg | ORAL_TABLET | Freq: Four times a day (QID) | ORAL | Status: DC | PRN

## 2022-10-01 MED ORDER — PANTOPRAZOLE SODIUM 40 MG PO TBEC
40.0000 mg | DELAYED_RELEASE_TABLET | Freq: Every day | ORAL | Status: DC
  Administered 2022-10-02 – 2022-10-07 (×5): 40 mg via ORAL
  Filled 2022-10-01 (×5): qty 1

## 2022-10-01 MED ORDER — ONDANSETRON HCL 4 MG/2ML IJ SOLN: 4.0000 mg | Freq: Four times a day (QID) | INTRAMUSCULAR | Status: DC | PRN

## 2022-10-01 MED ORDER — METRONIDAZOLE 500 MG/100ML IV SOLN
500.0000 mg | Freq: Two times a day (BID) | INTRAVENOUS | Status: AC
  Administered 2022-10-01 – 2022-10-08 (×13): 500 mg via INTRAVENOUS
  Filled 2022-10-01 (×13): qty 100

## 2022-10-01 MED ORDER — SODIUM CHLORIDE 0.9 % IV BOLUS
1000.0000 mL | Freq: Once | INTRAVENOUS | Status: AC
  Administered 2022-10-01: 1000 mL via INTRAVENOUS

## 2022-10-01 MED ORDER — LEVETIRACETAM 500 MG PO TABS
500.0000 mg | ORAL_TABLET | Freq: Two times a day (BID) | ORAL | Status: DC
  Administered 2022-10-02 – 2022-10-11 (×19): 500 mg via ORAL
  Filled 2022-10-01 (×20): qty 1

## 2022-10-01 MED ORDER — DULOXETINE HCL 30 MG PO CPEP
30.0000 mg | ORAL_CAPSULE | Freq: Every day | ORAL | Status: DC
  Administered 2022-10-02 – 2022-10-11 (×9): 30 mg via ORAL
  Filled 2022-10-01 (×9): qty 1

## 2022-10-01 MED ORDER — PAROXETINE HCL 10 MG PO TABS
10.0000 mg | ORAL_TABLET | Freq: Every day | ORAL | Status: DC
  Administered 2022-10-02 – 2022-10-11 (×9): 10 mg via ORAL
  Filled 2022-10-01 (×11): qty 1

## 2022-10-01 MED ORDER — GABAPENTIN 300 MG PO CAPS
300.0000 mg | ORAL_CAPSULE | Freq: Three times a day (TID) | ORAL | Status: DC
  Administered 2022-10-02 – 2022-10-11 (×28): 300 mg via ORAL
  Filled 2022-10-01 (×29): qty 1

## 2022-10-01 MED ORDER — INSULIN ASPART 100 UNIT/ML IJ SOLN: 0.0000 [IU] | Freq: Three times a day (TID) | INTRAMUSCULAR | Status: DC

## 2022-10-01 MED ORDER — INSULIN GLARGINE-YFGN 100 UNIT/ML ~~LOC~~ SOLN
12.0000 [IU] | Freq: Every day | SUBCUTANEOUS | Status: DC
  Filled 2022-10-01 (×2): qty 0.12

## 2022-10-01 MED ORDER — METOPROLOL SUCCINATE ER 25 MG PO TB24
25.0000 mg | ORAL_TABLET | Freq: Every morning | ORAL | Status: DC
  Administered 2022-10-02 – 2022-10-04 (×3): 25 mg via ORAL
  Filled 2022-10-01 (×3): qty 1

## 2022-10-01 MED ORDER — INSULIN ASPART 100 UNIT/ML IJ SOLN: 0.0000 [IU] | Freq: Every day | INTRAMUSCULAR | Status: DC

## 2022-10-01 MED ORDER — ENOXAPARIN SODIUM 40 MG/0.4ML IJ SOSY
40.0000 mg | PREFILLED_SYRINGE | INTRAMUSCULAR | Status: DC
  Administered 2022-10-02 – 2022-10-03 (×2): 40 mg via SUBCUTANEOUS
  Filled 2022-10-01 (×2): qty 0.4

## 2022-10-01 MED ORDER — SODIUM CHLORIDE 0.9 % IV SOLN
2.0000 g | INTRAVENOUS | Status: AC
  Administered 2022-10-01 – 2022-10-07 (×7): 2 g via INTRAVENOUS
  Filled 2022-10-01 (×7): qty 20

## 2022-10-01 MED ORDER — MELOXICAM 7.5 MG PO TABS
7.5000 mg | ORAL_TABLET | Freq: Every day | ORAL | Status: DC | PRN
  Administered 2022-10-02 – 2022-10-04 (×4): 7.5 mg via ORAL
  Filled 2022-10-01 (×6): qty 1
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
## 2022-10-01 NOTE — ED Notes (Signed)
MD notified of LA level. Will prioritize and place pt in next available room.

## 2022-10-02 DIAGNOSIS — E119 Type 2 diabetes mellitus without complications: Secondary | ICD-10-CM

## 2022-10-02 DIAGNOSIS — I998 Other disorder of circulatory system: Secondary | ICD-10-CM

## 2022-10-02 DIAGNOSIS — I10 Essential (primary) hypertension: Secondary | ICD-10-CM

## 2022-10-02 DIAGNOSIS — E11622 Type 2 diabetes mellitus with other skin ulcer: Secondary | ICD-10-CM

## 2022-10-02 DIAGNOSIS — L97909 Non-pressure chronic ulcer of unspecified part of unspecified lower leg with unspecified severity: Secondary | ICD-10-CM

## 2022-10-02 LAB — GLUCOSE, CAPILLARY
Glucose-Capillary: 103 mg/dL — ABNORMAL HIGH (ref 70–99)
Glucose-Capillary: 48 mg/dL — ABNORMAL LOW (ref 70–99)
Glucose-Capillary: 77 mg/dL (ref 70–99)
Glucose-Capillary: 77 mg/dL (ref 70–99)
Glucose-Capillary: 78 mg/dL (ref 70–99)
Glucose-Capillary: 91 mg/dL (ref 70–99)

## 2022-10-02 LAB — BASIC METABOLIC PANEL
Anion gap: 5 (ref 5–15)
BUN: 11 mg/dL (ref 8–23)
CO2: 29 mmol/L (ref 22–32)
Calcium: 8.6 mg/dL — ABNORMAL LOW (ref 8.9–10.3)
Chloride: 105 mmol/L (ref 98–111)
Creatinine, Ser: 0.73 mg/dL (ref 0.61–1.24)
GFR, Estimated: >60 mL/min (ref >60)
Glucose, Bld: 93 mg/dL (ref 70–99)
Potassium: 4 mmol/L (ref 3.5–5.1)
Sodium: 139 mmol/L (ref 135–145)

## 2022-10-02 LAB — CBC
HCT: 29.1 % — ABNORMAL LOW (ref 39.0–52.0)
Hemoglobin: 9.5 g/dL — ABNORMAL LOW (ref 13.0–17.0)
MCH: 29.3 pg (ref 26.0–34.0)
MCHC: 32.6 g/dL (ref 30.0–36.0)
MCV: 89.8 fL (ref 80.0–100.0)
Platelets: 413 10*3/uL — ABNORMAL HIGH (ref 150–400)
RBC: 3.24 MIL/uL — ABNORMAL LOW (ref 4.22–5.81)
RDW: 12.6 % (ref 11.5–15.5)
WBC: 13.3 10*3/uL — ABNORMAL HIGH (ref 4.0–10.5)
nRBC: 0 % (ref 0.0–0.2)

## 2022-10-02 MED ORDER — DEXTROSE 50 % IV SOLN
25.0000 g | INTRAVENOUS | Status: AC
  Administered 2022-10-02: 25 g via INTRAVENOUS
  Filled 2022-10-02: qty 50
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
## 2022-10-02 NOTE — Care Plan (Addendum)
0816 Patient AxOx2, disoriented to time and situation. Glucose 48, pt asymptomatic, Dr Sloan Leiter notified, patient medicated with [id number] per hypoglicemia protocol. Will reassess.   Patient stood up with assistance near the bed. Was incontinent so external urinary catheter was started. In late afternoon he was agitated and was refusing the IV antibiotic. I called his daughter and she convinced the patient to accept treatment.
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
## 2022-10-03 DIAGNOSIS — I998 Other disorder of circulatory system: Secondary | ICD-10-CM | POA: Diagnosis not present

## 2022-10-03 LAB — BASIC METABOLIC PANEL
Anion gap: 13 (ref 5–15)
BUN: 12 mg/dL (ref 8–23)
CO2: 24 mmol/L (ref 22–32)
Calcium: 9 mg/dL (ref 8.9–10.3)
Chloride: 104 mmol/L (ref 98–111)
Creatinine, Ser: 0.82 mg/dL (ref 0.61–1.24)
GFR, Estimated: >60 mL/min (ref >60)
Glucose, Bld: 99 mg/dL (ref 70–99)
Potassium: 4 mmol/L (ref 3.5–5.1)
Sodium: 141 mmol/L (ref 135–145)

## 2022-10-03 LAB — CBC WITH DIFFERENTIAL/PLATELET
Abs Immature Granulocytes: 0.05 10*3/uL (ref 0.00–0.07)
Basophils Absolute: 0.1 10*3/uL (ref 0.0–0.1)
Basophils Relative: 0 %
Eosinophils Absolute: 0.4 10*3/uL (ref 0.0–0.5)
Eosinophils Relative: 3 %
HCT: 35.1 % — ABNORMAL LOW (ref 39.0–52.0)
Hemoglobin: 11.4 g/dL — ABNORMAL LOW (ref 13.0–17.0)
Immature Granulocytes: 0 %
Lymphocytes Relative: 28 %
Lymphs Abs: 4.3 10*3/uL — ABNORMAL HIGH (ref 0.7–4.0)
MCH: 29.2 pg (ref 26.0–34.0)
MCHC: 32.5 g/dL (ref 30.0–36.0)
MCV: 89.8 fL (ref 80.0–100.0)
Monocytes Absolute: 1.8 10*3/uL — ABNORMAL HIGH (ref 0.1–1.0)
Monocytes Relative: 12 %
Neutro Abs: 8.9 10*3/uL — ABNORMAL HIGH (ref 1.7–7.7)
Neutrophils Relative %: 57 %
Platelets: 408 10*3/uL — ABNORMAL HIGH (ref 150–400)
RBC: 3.91 MIL/uL — ABNORMAL LOW (ref 4.22–5.81)
RDW: 12.7 % (ref 11.5–15.5)
WBC: 15.6 10*3/uL — ABNORMAL HIGH (ref 4.0–10.5)
nRBC: 0 % (ref 0.0–0.2)

## 2022-10-03 LAB — MAGNESIUM: Magnesium: 1.5 mg/dL — ABNORMAL LOW (ref 1.7–2.4)

## 2022-10-03 LAB — GLUCOSE, CAPILLARY
Glucose-Capillary: 50 mg/dL — ABNORMAL LOW (ref 70–99)
Glucose-Capillary: 129 mg/dL — ABNORMAL HIGH (ref 70–99)
Glucose-Capillary: 73 mg/dL (ref 70–99)
Glucose-Capillary: 73 mg/dL (ref 70–99)
Glucose-Capillary: 71 mg/dL (ref 70–99)

## 2022-10-03 LAB — PROCALCITONIN: Procalcitonin: <0.1 ng/mL

## 2022-10-03 LAB — BRAIN NATRIURETIC PEPTIDE: B Natriuretic Peptide: 243.2 pg/mL — ABNORMAL HIGH (ref 0.0–100.0)

## 2022-10-03 MED ORDER — JUVEN PO PACK
1.0000 | PACK | Freq: Two times a day (BID) | ORAL | Status: DC
  Administered 2022-10-03 – 2022-10-05 (×3): 1 via ORAL
  Filled 2022-10-03 (×3): qty 1

## 2022-10-03 MED ORDER — NICOTINE 21 MG/24HR TD PT24
21.0000 mg | MEDICATED_PATCH | Freq: Every day | TRANSDERMAL | Status: DC
  Administered 2022-10-03 – 2022-10-11 (×8): 21 mg via TRANSDERMAL
  Filled 2022-10-03 (×8): qty 1

## 2022-10-03 MED ORDER — HALOPERIDOL LACTATE 5 MG/ML IJ SOLN
5.0000 mg | Freq: Once | INTRAMUSCULAR | Status: AC | PRN
  Administered 2022-10-03: 5 mg via INTRAVENOUS
  Filled 2022-10-03: qty 1

## 2022-10-03 MED ORDER — ADULT MULTIVITAMIN W/MINERALS CH
1.0000 | ORAL_TABLET | Freq: Every day | ORAL | Status: DC
  Administered 2022-10-03 – 2022-10-11 (×8): 1 via ORAL
  Filled 2022-10-03 (×8): qty 1

## 2022-10-03 MED ORDER — ENSURE MAX PROTEIN PO LIQD
11.0000 [oz_av] | Freq: Every day | ORAL | Status: DC
  Administered 2022-10-03: 11 [oz_av] via ORAL
  Filled 2022-10-03 (×3): qty 330

## 2022-10-03 MED ORDER — INSULIN ASPART 100 UNIT/ML IJ SOLN
0.0000 [IU] | Freq: Three times a day (TID) | INTRAMUSCULAR | Status: DC
  Administered 2022-10-05: 2 [IU] via SUBCUTANEOUS

## 2022-10-03 MED ORDER — NICOTINE POLACRILEX 2 MG MT GUM
2.0000 mg | CHEWING_GUM | OROMUCOSAL | Status: DC | PRN
  Administered 2022-10-10: 2 mg via ORAL
  Filled 2022-10-03 (×2): qty 1

## 2022-10-03 MED ORDER — MAGNESIUM SULFATE 4 GM/100ML IV SOLN
4.0000 g | Freq: Once | INTRAVENOUS | Status: AC
  Administered 2022-10-03: 4 g via INTRAVENOUS
  Filled 2022-10-03: qty 100

## 2022-10-03 MED ORDER — ACETAMINOPHEN 325 MG PO TABS
650.0000 mg | ORAL_TABLET | Freq: Three times a day (TID) | ORAL | Status: DC | PRN
  Administered 2022-10-03 – 2022-10-06 (×3): 650 mg via ORAL
  Filled 2022-10-03 (×3): qty 2
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
## 2022-10-03 NOTE — Consult Note (Signed)
WOC Nurse Consult Note: Reason for Consult:Ischemic wounds to left LE and heel. Photodocumentation of wounds provided to EMR by Provider. Vascular Surgery is simultaneously consulted and a revascularization procedure is planned for tomorrow with Dr. Stanford Breed. Wound type: PAD Pressure Injury POA: DTPI vs ischemic wound to left heel Measurement:To be obtained by Bedside RN and documented on Nursing Flow Sheet today with next dressing application. Wound bed:See photodocumentation  Drainage (amount, consistency, odor) Small serous Periwound: intact Dressing procedure/placement/frequency:  I have provided conservative care orders for the two left LE wounds and the heel using a NS cleanse and dry followed by painting the wounds with a povidone-iodine swabstick and allowing to air dry. When dry, the lesions are to be covered with dry gauze 4x4s and secures with a few turns of Kerlix roll guaze/paper tape. Feet are to be placed into Prevalon pressure redistribution heel boots. A sacral silicone foam is placed for pI prevention.   I will defer any other wound guidance to Vascular Surgery (Dr. Stanford Breed).  [facility] nursing team will not follow, but will remain available to this patient, the nursing and medical teams.  Please re-consult if needed.  Thank you for inviting [place] to participate in this patient's Plan of Care.  Maudie Flakes, MSN, RN, CNS, [place], Serita Grammes, [employer], [employer] phone:  [phone]

## 2022-10-03 NOTE — Evaluation (Signed)
Physical Therapy Evaluation  Patient Details Name: Jacob Mcgrath. MRN: [id number] DOB: 03/01/1956 Today's Date: 10/03/2022  History of Present Illness  Pt is a 67 y/o male who presents 3/8 with nonhealing wounds on the LLE. ED note from 08/2022 states pt reports he got foot/leg caught up in a bike chain, however the wounds appeared chronic at that time. PMH significant for DM, HTN.   Clinical Impression  Pt admitted with above diagnosis. Pt currently with functional limitations due to the deficits listed below (see PT Problem List). At the time of PT eval pt was able to perform transfers with up to mod assist and max assist to maintain balance/prevent fall with attempts at taking steps at EOB. Recommend the [facility] with nursing staff for OOB mobility. Anticipate pt will require SNF level rehab at d/c. Acutely, pt will benefit from skilled PT to increase their independence and safety with mobility to allow discharge to the venue listed below.          Recommendations for follow up therapy are one component of a multi-disciplinary discharge planning process, led by the attending physician.  Recommendations may be updated based on patient status, additional functional criteria and insurance authorization.  Follow Up Recommendations Skilled nursing-short term rehab (<3 hours/day) Can patient physically be transported by private vehicle: No    Assistance Recommended at Discharge Frequent or constant Supervision/Assistance  Patient can return home with the following  Two people to help with walking and/or transfers;A lot of help with bathing/dressing/bathroom;Assistance with cooking/housework;Assist for transportation;Help with stairs or ramp for entrance    Equipment Recommendations Other (comment) (TBD by next venue of care)  Recommendations for Other Services       Functional Status Assessment Patient has had a recent decline in their functional status and demonstrates the ability to make  significant improvements in function in a reasonable and predictable amount of time.     Precautions / Restrictions Precautions Precautions: Fall Restrictions Weight Bearing Restrictions: No      Mobility  Bed Mobility Overal bed mobility: Needs Assistance Bed Mobility: Supine to Sit, Sit to Supine     Supine to sit: Supervision Sit to supine: Supervision   General bed mobility comments: Increased time and effort but pt able to transition to/from EOB without assistance. Several cues to scoot out to get feet on the floor.    Transfers Overall transfer level: Needs assistance Equipment used: Straight cane Transfers: Sit to/from Stand Sit to Stand: Mod assist           General transfer comment: Assist to power up to full stand and to gain/maintain standing balance.    Ambulation/Gait             Pre-gait activities: Pt with significant LOB with attempts at stepping away from bed, requiring max assist to recover and prevent fall. Pt blamed therapist for "pulling" with the gait belt, however gait belt utilized to keep pt nearer to midline in attempts to prevent a fall. On second attempt, pt attempted to laterally step and immediately had to sit down due to LOB. Poor awareness of deficits overall during this time.    Stairs            Wheelchair Mobility    Modified Rankin (Stroke Patients Only)       Balance Overall balance assessment: Needs assistance Sitting-balance support: No upper extremity supported, Feet supported Sitting balance-Leahy Scale: Fair     Standing balance support: Single extremity supported, During functional activity,  Reliant on assistive device for balance Standing balance-Leahy Scale: Zero Standing balance comment: Up to max assist                             Pertinent Vitals/Pain Pain Assessment Pain Assessment: 0-10 Pain Score: 7  Pain Location: L leg, headache Pain Descriptors / Indicators: Headache,  Tightness, Throbbing Pain Intervention(s): Monitored during session, Limited activity within patient's tolerance, Repositioned    Home Living Family/patient expects to be discharged to:: Private residence Living Arrangements: Non-relatives/Friends Jacob Mcgrath, Doctor, general practice) Available Help at Discharge: Family;Friend(s);Available PRN/intermittently Type of Home: House Home Access: Stairs to enter Entrance Stairs-Rails: Right;Left;Can reach both Entrance Stairs-Number of Steps: 6   Home Layout: One level Home Equipment: Conservation officer, nature (2 wheels);Cane - single point      Prior Function Prior Level of Function : Independent/Modified Independent             Mobility Comments: Uses the cane all the time. Pt endorses falls at home - 1 in the last couple weeks. ADLs Comments: Pt does not drive, family/friends pick him up for grocery shopping.     Hand Dominance   Dominant Hand: Right    Extremity/Trunk Assessment   Upper Extremity Assessment Upper Extremity Assessment: LUE deficits/detail LUE Deficits / Details: Atrophied, claw-hand. Elbow flexion contracture and weakness up to shoulder. OT eval requested.    Lower Extremity Assessment Lower Extremity Assessment: LLE deficits/detail LLE Deficits / Details: Gross weakness. Wounds on foot and lower leg from prior injury. Decreased ankle AROM.    Cervical / Trunk Assessment Cervical / Trunk Assessment: Other exceptions Cervical / Trunk Exceptions: Forward head posture with rounded shoulders  Communication   Communication: No difficulties  Cognition Arousal/Alertness: Awake/alert Behavior During Therapy: WFL for tasks assessed/performed Overall Cognitive Status: No family/caregiver present to determine baseline cognitive functioning                                          General Comments      Exercises     Assessment/Plan    PT Assessment Patient needs continued PT services  PT Problem List Decreased  strength;Decreased range of motion;Decreased activity tolerance;Decreased balance;Decreased mobility;Decreased knowledge of use of DME;Decreased safety awareness;Decreased knowledge of precautions;Pain       PT Treatment Interventions DME instruction;Gait training;Stair training;Functional mobility training;Therapeutic activities;Therapeutic exercise;Balance training;Cognitive remediation;Patient/family education    PT Goals (Current goals can be found in the Care Plan section)  Acute Rehab PT Goals Patient Stated Goal: None stated PT Goal Formulation: With patient Time For Goal Achievement: 10/17/22 Potential to Achieve Goals: Fair    Frequency Min 2X/week     Co-evaluation               AM-PAC PT "6 Clicks" Mobility  Outcome Measure Help needed turning from your back to your side while in a flat bed without using bedrails?: A Little Help needed moving from lying on your back to sitting on the side of a flat bed without using bedrails?: A Little Help needed moving to and from a bed to a chair (including a wheelchair)?: Total Help needed standing up from a chair using your arms (e.g., wheelchair or bedside chair)?: A Lot Help needed to walk in hospital room?: Total Help needed climbing 3-5 steps with a railing? : Total 6 Click Score: 11  End of Session Equipment Utilized During Treatment: Gait belt Activity Tolerance: Patient tolerated treatment well Patient left: in bed;with call bell/phone within reach;with bed alarm set Nurse Communication: Mobility status PT Visit Diagnosis: Unsteadiness on feet (R26.81);Pain Pain - Right/Left: Left Pain - part of body: Leg    Time: 1136-1210 PT Time Calculation (min) (ACUTE ONLY): 34 min   Charges:   PT Evaluation $PT Eval Moderate Complexity: 1 Mod PT Treatments $Gait Training: 8-22 mins        Rolinda Roan, PT, DPT Acute Rehabilitation Services Secure Chat Preferred Office: [phone]   Thelma Comp 10/03/2022, 2:53 PM

## 2022-10-03 NOTE — Inpatient Diabetes Management (Signed)
Inpatient Diabetes Program Recommendations  AACE/ADA: New Consensus Statement on Inpatient Glycemic Control (2015)  Target Ranges:  Prepandial:   less than 140 mg/dL      Peak postprandial:   less than 180 mg/dL (1-2 hours)      Critically ill patients:  140 - 180 mg/dL   Lab Results  Component Value Date   GLUCAP 73 10/03/2022    Review of Glycemic Control  Diabetes history: DM2 Outpatient Diabetes medications: metformin 500 BID Current orders for Inpatient glycemic control: Novolog 0-9 units TID  Hypos for past 2 days in am.  Inpatient Diabetes Program Recommendations:    Need updated HgbA1C. Please order.  Will continue to follow.  Thank you. Lorenda Peck, RD, LDN, [place] Inpatient Diabetes Coordinator [phone]

## 2022-10-03 NOTE — Progress Notes (Signed)
[facility] [place] PROGRESS NOTE  ASSESSMENT / PLAN: Jacob Mcgrath. is a 67 y.o. 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  Plan angiogram tomorrow.  N.p.o. after midnight.  Orders for consent written.  SUBJECTIVE: Pain continues in the left leg.  OBJECTIVE: BP (!) 142/73 (BP Location: Right Arm)   Pulse 91   Temp 98.3 F (36.8 C) (Oral)   Resp 18   Ht '5\' 9"'$  (1.753 m)   Wt 58.8 kg   SpO2 96%   BMI 19.14 kg/m   Intake/Output Summary (Last 24 hours) at 10/03/2022 1516 Last data filed at 10/03/2022 1500 Gross per 24 hour  Intake 1605.78 ml  Output 1800 ml  Net -194.22 ml    No distress Unchanged appearance of left foot     Latest Ref Rng & Units 10/03/2022    3:10 AM 10/02/2022    2:14 AM 10/01/2022    2:25 PM  CBC  WBC 4.0 - 10.5 K/uL 15.6  13.3  13.8   Hemoglobin 13.0 - 17.0 g/dL 11.4  9.5  9.9   Hematocrit 39.0 - 52.0 % 35.1  29.1  31.8   Platelets 150 - 400 K/uL 408  413  430         Latest Ref Rng & Units 10/03/2022    3:10 AM 10/02/2022    2:14 AM 10/01/2022    2:25 PM  CMP  Glucose 70 - 99 mg/dL 99  93  116   BUN 8 - 23 mg/dL '12  11  20   '$ Creatinine 0.61 - 1.24 mg/dL 0.82  0.73  0.78   Sodium 135 - 145 mmol/L 141  139  138   Potassium 3.5 - 5.1 mmol/L 4.0  4.0  4.4   Chloride 98 - 111 mmol/L 104  105  105   CO2 22 - 32 mmol/L '24  29  27   '$ Calcium 8.9 - 10.3 mg/dL 9.0  8.6  9.2     Estimated Creatinine Clearance: 73.7 mL/min (by C-G formula based on SCr of 0.82 mg/dL).  Jacob Aline. Stanford Breed, MD [facility] Phone Number: [phone] 10/03/2022 3:16 PM

## 2022-10-03 NOTE — Discharge Instructions (Addendum)
Follow with Primary MD Carrolyn Meiers, MD in 7 days   Get CBC, CMP, 2 view Chest X ray -  checked next visit with your primary MD or SNF MD   Activity: As tolerated with Full fall precautions use walker/cane & assistance as needed  Disposition SNF  Diet: Heart Healthy   Special Instructions: If you have smoked or chewed Tobacco  in the last 2 yrs please stop smoking, stop any regular Alcohol  and or any Recreational drug use.  On your next visit with your primary care physician please Get Medicines reviewed and adjusted.  Please request your Prim.MD to go over all Hospital Tests and Procedure/Radiological results at the follow up, please get all Hospital records sent to your Prim MD by signing hospital release before you go home.  If you experience worsening of your admission symptoms, develop shortness of breath, life threatening emergency, suicidal or homicidal thoughts you must seek medical attention immediately by calling 911 or calling your MD immediately  if symptoms less severe.  You Must read complete instructions/literature along with all the possible adverse reactions/side effects for all the Medicines you take and that have been prescribed to you. Take any new Medicines after you have completely understood and accpet all the possible adverse reactions/side effects.       Carbohydrate Counting For People With Diabetes  Foods with carbohydrates make your blood glucose level go up. Learning how to count carbohydrates can help you control your blood glucose levels. First, identify the foods you eat that contain carbohydrates. Then, using the Foods with Carbohydrates chart, determine about how much carbohydrates are in your meals and snacks. Make sure you are eating foods with fiber, protein, and healthy fat along with your carbohydrate foods. Foods with Carbohydrates The following table shows carbohydrate foods that have about 15 grams of carbohydrate each. Using measuring  cups, spoons, or a food scale when you first begin learning about carbohydrate counting can help you learn about the portion sizes you typically eat. The following foods have 15 grams carbohydrate each:  Grains 1 slice bread (1 ounce)  1 small tortilla (6-inch size)   large bagel (1 ounce)  1/3 cup pasta or rice (cooked)   hamburger or hot dog bun ( ounce)   cup cooked cereal   to  cup ready-to-eat cereal  2 taco shells (5-inch size) Fruit 1 small fresh fruit ( to 1 cup)   medium banana  17 small grapes (3 ounces)  1 cup melon or berries   cup canned or frozen fruit  2 tablespoons dried fruit (blueberries, cherries, cranberries, raisins)   cup unsweetened fruit juice  Starchy Vegetables  cup cooked beans, peas, corn, potatoes/sweet potatoes   large baked potato (3 ounces)  1 cup acorn or butternut squash  Snack Foods 3 to 6 crackers  8 potato chips or 13 tortilla chips ( ounce to 1 ounce)  3 cups popped popcorn  Dairy 3/4 cup (6 ounces) nonfat plain yogurt, or yogurt with sugar-free sweetener  1 cup milk  1 cup plain rice, soy, coconut or flavored almond milk Sweets and Desserts  cup ice cream or frozen yogurt  1 tablespoon jam, jelly, pancake syrup, table sugar, or honey  2 tablespoons light pancake syrup  1 inch square of frosted cake or 2 inch square of unfrosted cake  2 small cookies (2/3 ounce each) or  large cookie  Sometimes you'll have to estimate carbohydrate amounts if you don't know the exact recipe.  One cup of mixed foods like soups can have 1 to 2 carbohydrate servings, while some casseroles might have 2 or more servings of carbohydrate. Foods that have less than 20 calories in each serving can be counted as "free" foods. Count 1 cup raw vegetables, or  cup cooked non-starchy vegetables as "free" foods. If you eat 3 or more servings at one meal, then count them as 1 carbohydrate serving.  Foods without Carbohydrates  Not all foods contain  carbohydrates. Meat, some dairy, fats, non-starchy vegetables, and many beverages don't contain carbohydrate. So when you count carbohydrates, you can generally exclude chicken, pork, beef, fish, seafood, eggs, tofu, cheese, butter, sour cream, avocado, nuts, seeds, olives, mayonnaise, water, black coffee, unsweetened tea, and zero-calorie drinks. Vegetables with no or low carbohydrate include green beans, cauliflower, tomatoes, and onions. How much carbohydrate should I eat at each meal?  Carbohydrate counting can help you plan your meals and manage your weight. Following are some starting points for carbohydrate intake at each meal. Work with your registered dietitian nutritionist to find the best range that works for your blood glucose and weight.   To Lose Weight To Maintain Weight  Women 2 - 3 carb servings 3 - 4 carb servings  Men 3 - 4 carb servings 4 - 5 carb servings  Checking your blood glucose after meals will help you know if you need to adjust the timing, type, or number of carbohydrate servings in your meal plan. Achieve and keep a healthy body weight by balancing your food intake and physical activity.  Tips How should I plan my meals?  Plan for half the food on your plate to include non-starchy vegetables, like salad greens, broccoli, or carrots. Try to eat 3 to 5 servings of non-starchy vegetables every day. Have a protein food at each meal. Protein foods include chicken, fish, meat, eggs, or beans (note that beans contain carbohydrate). These two food groups (non-starchy vegetables and proteins) are low in carbohydrate. If you fill up your plate with these foods, you will eat less carbohydrate but still fill up your stomach. Try to limit your carbohydrate portion to  of the plate.  What fats are healthiest to eat?  Diabetes increases risk for heart disease. To help protect your heart, eat more healthy fats, such as olive oil, nuts, and avocado. Eat less saturated fats like butter,  cream, and high-fat meats, like bacon and sausage. Avoid trans fats, which are in all foods that list "partially hydrogenated oil" as an ingredient. What should I drink?  Choose drinks that are not sweetened with sugar. The healthiest choices are water, carbonated or seltzer waters, and tea and coffee without added sugars.  Sweet drinks will make your blood glucose go up very quickly. One serving of soda or energy drink is  cup. It is best to drink these beverages only if your blood glucose is low.  Artificially sweetened, or diet drinks, typically do not increase your blood glucose if they have zero calories in them. Read labels of beverages, as some diet drinks do have carbohydrate and will raise your blood glucose. Label Reading Tips Read Nutrition Facts labels to find out how many grams of carbohydrate are in a food you want to eat. Don't forget: sometimes serving sizes on the label aren't the same as how much food you are going to eat, so you may need to calculate how much carbohydrate is in the food you are serving yourself.   Carbohydrate Counting for People  with Diabetes Sample 1-Day Menu  Breakfast  cup yogurt, low fat, low sugar (1 carbohydrate serving)   cup cereal, ready-to-eat, unsweetened (1 carbohydrate serving)  1 cup strawberries (1 carbohydrate serving)   cup almonds ( carbohydrate serving)  Lunch 1, 5 ounce can chunk light tuna  2 ounces cheese, low fat cheddar  6 whole wheat crackers (1 carbohydrate serving)  1 small apple (1 carbohydrate servings)   cup carrots ( carbohydrate serving)   cup snap peas  1 cup 1% milk (1 carbohydrate serving)   Evening Meal Stir fry made with: 3 ounces chicken  1 cup brown rice (3 carbohydrate servings)   cup broccoli ( carbohydrate serving)   cup green beans   cup onions  1 tablespoon olive oil  2 tablespoons teriyaki sauce ( carbohydrate serving)  Evening Snack 1 extra small banana (1 carbohydrate serving)  1 tablespoon  peanut butter   Carbohydrate Counting for People with Diabetes Vegan Sample 1-Day Menu  Breakfast 1 cup cooked oatmeal (2 carbohydrate servings)   cup blueberries (1 carbohydrate serving)  2 tablespoons flaxseeds  1 cup soymilk fortified with calcium and vitamin D  1 cup coffee  Lunch 2 slices whole wheat bread (2 carbohydrate servings)   cup baked tofu   cup lettuce  2 slices tomato  2 slices avocado   cup baby carrots ( carbohydrate serving)  1 orange (1 carbohydrate serving)  1 cup soymilk fortified with calcium and vitamin D   Evening Meal Burrito made with: 1 6-inch corn tortilla (1 carbohydrate serving)  1 cup refried vegetarian beans (2 carbohydrate servings)   cup chopped tomatoes   cup lettuce   cup salsa  1/3 cup brown rice (1 carbohydrate serving)  1 tablespoon olive oil for rice   cup zucchini   Evening Snack 6 small whole grain crackers (1 carbohydrate serving)  2 apricots ( carbohydrate serving)   cup unsalted peanuts ( carbohydrate serving)    Carbohydrate Counting for People with Diabetes Vegetarian (Lacto-Ovo) Sample 1-Day Menu  Breakfast 1 cup cooked oatmeal (2 carbohydrate servings)   cup blueberries (1 carbohydrate serving)  2 tablespoons flaxseeds  1 egg  1 cup 1% milk (1 carbohydrate serving)  1 cup coffee  Lunch 2 slices whole wheat bread (2 carbohydrate servings)  2 ounces low-fat cheese   cup lettuce  2 slices tomato  2 slices avocado   cup baby carrots ( carbohydrate serving)  1 orange (1 carbohydrate serving)  1 cup unsweetened tea  Evening Meal Burrito made with: 1 6-inch corn tortilla (1 carbohydrate serving)   cup refried vegetarian beans (1 carbohydrate serving)   cup tomatoes   cup lettuce   cup salsa  1/3 cup brown rice (1 carbohydrate serving)  1 tablespoon olive oil for rice   cup zucchini  1 cup 1% milk (1 carbohydrate serving)  Evening Snack 6 small whole grain crackers (1 carbohydrate serving)  2 apricots  ( carbohydrate serving)   cup unsalted peanuts ( carbohydrate serving)    Copyright 2020  Academy of [employer] and Dietetics. All rights reserved.  Using Nutrition Labels: Carbohydrate  Serving Size  Look at the serving size. All the information on the label is based on this portion. Servings Per Container  The number of servings contained in the package. Guidelines for Carbohydrate  Look at the total grams of carbohydrate in the serving size.  1 carbohydrate choice = 15 grams of carbohydrate. Range of Carbohydrate Grams Per Choice  Carbohydrate Grams/Choice Carbohydrate  Choices  6-10   11-20 1  21-25 1  26-35 2  36-40 2  41-50 3  51-55 3  56-65 4  66-70 4  71-80 5    Copyright 2020  Academy of Nutrition and Dietetics. All rights reserved.

## 2022-10-03 NOTE — Plan of Care (Addendum)
Patient was hypoglicemic in AM, asymptomatic, glucose level corrected with PO orange juice. Dr Candiss Norse aware and adjusted insulin dose.  In afternoon patient reported intermitent shooting pain in L ankle and muscle spasms in LLE. Dr Ronnie Derby notified and vascular team informed during bedside visit.   Foam dressing for prevention was changed on coccyx.   1900 I called and talked with Ms Heloise Beecham, the legal guardian for the patient. I updated her about how Mr Sullo was today and I informed her that the patient might have tomorrow an angiogram done and we need consent for the procedure. She said that the Dr didn't call her yet to explain the procedure. We'll request Dr to call her.   [id number] Patient become very agitated at change of shift, trying to go to the bathroom to pee. Tried with no success to reorient him and explained that he has an external catheter. Patient very weak, unable to walk. Agreed to sit on [facility]
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
## 2022-10-03 NOTE — Progress Notes (Signed)
Petru, RN called and spoke with legal guardian Heloise Beecham re: consent for angiogram, who requested she speak with MD about procedure before consent. Paged Dr. Stanford Breed who states he will call legal guardian on Monday 10/04/22. Her number is [phone].

## 2022-10-04 ENCOUNTER — Other Ambulatory Visit: Payer: Self-pay

## 2022-10-04 ENCOUNTER — Encounter (HOSPITAL_COMMUNITY): Payer: Self-pay | Admitting: Family Medicine

## 2022-10-04 ENCOUNTER — Inpatient Hospital Stay (HOSPITAL_COMMUNITY)
Admission: EM | Disposition: A | Discharge status: Skilled Nursing Facility | Payer: Self-pay | Source: Home / Self Care | Attending: Internal Medicine

## 2022-10-04 DIAGNOSIS — I998 Other disorder of circulatory system: Secondary | ICD-10-CM | POA: Diagnosis not present

## 2022-10-04 DIAGNOSIS — I70245 Atherosclerosis of native arteries of left leg with ulceration of other part of foot: Secondary | ICD-10-CM

## 2022-10-04 HISTORY — PX: ABDOMINAL AORTOGRAM W/LOWER EXTREMITY: CATH118223

## 2022-10-04 HISTORY — PX: PERIPHERAL VASCULAR INTERVENTION: CATH118257

## 2022-10-04 LAB — COMPREHENSIVE METABOLIC PANEL
ALT: 11 U/L (ref 0–44)
AST: 15 U/L (ref 15–41)
Albumin: <1.5 g/dL — ABNORMAL LOW (ref 3.5–5.0)
Alkaline Phosphatase: 31 U/L — ABNORMAL LOW (ref 38–126)
Anion gap: 10 (ref 5–15)
BUN: 17 mg/dL (ref 8–23)
CO2: 21 mmol/L — ABNORMAL LOW (ref 22–32)
Calcium: 7.2 mg/dL — ABNORMAL LOW (ref 8.9–10.3)
Chloride: 108 mmol/L (ref 98–111)
Creatinine, Ser: 0.81 mg/dL (ref 0.61–1.24)
GFR, Estimated: >60 mL/min (ref >60)
Glucose, Bld: 115 mg/dL — ABNORMAL HIGH (ref 70–99)
Potassium: 3.8 mmol/L (ref 3.5–5.1)
Sodium: 139 mmol/L (ref 135–145)
Total Bilirubin: 0.5 mg/dL (ref 0.3–1.2)
Total Protein: 3.1 g/dL — ABNORMAL LOW (ref 6.5–8.1)

## 2022-10-04 LAB — CBC WITH DIFFERENTIAL/PLATELET
Abs Immature Granulocytes: 0.05 10*3/uL (ref 0.00–0.07)
Basophils Absolute: 0.1 10*3/uL (ref 0.0–0.1)
Basophils Relative: 0 %
Eosinophils Absolute: 0.6 10*3/uL — ABNORMAL HIGH (ref 0.0–0.5)
Eosinophils Relative: 4 %
HCT: 34.8 % — ABNORMAL LOW (ref 39.0–52.0)
Hemoglobin: 11.3 g/dL — ABNORMAL LOW (ref 13.0–17.0)
Immature Granulocytes: 0 %
Lymphocytes Relative: 26 %
Lymphs Abs: 4.1 10*3/uL — ABNORMAL HIGH (ref 0.7–4.0)
MCH: 29.4 pg (ref 26.0–34.0)
MCHC: 32.5 g/dL (ref 30.0–36.0)
MCV: 90.6 fL (ref 80.0–100.0)
Monocytes Absolute: 2.2 10*3/uL — ABNORMAL HIGH (ref 0.1–1.0)
Monocytes Relative: 14 %
Neutro Abs: 8.7 10*3/uL — ABNORMAL HIGH (ref 1.7–7.7)
Neutrophils Relative %: 56 %
Platelets: 404 10*3/uL — ABNORMAL HIGH (ref 150–400)
RBC: 3.84 MIL/uL — ABNORMAL LOW (ref 4.22–5.81)
RDW: 12.7 % (ref 11.5–15.5)
WBC: 15.8 10*3/uL — ABNORMAL HIGH (ref 4.0–10.5)
nRBC: 0 % (ref 0.0–0.2)

## 2022-10-04 LAB — HEMOGLOBIN A1C
Hgb A1c MFr Bld: 5.6 % (ref 4.8–5.6)
Mean Plasma Glucose: 114 mg/dL

## 2022-10-04 LAB — MAGNESIUM: Magnesium: 2 mg/dL (ref 1.7–2.4)

## 2022-10-04 LAB — CBC
HCT: 36 % — ABNORMAL LOW (ref 39.0–52.0)
Hemoglobin: 11.4 g/dL — ABNORMAL LOW (ref 13.0–17.0)
MCH: 29 pg (ref 26.0–34.0)
MCHC: 31.7 g/dL (ref 30.0–36.0)
MCV: 91.6 fL (ref 80.0–100.0)
Platelets: 475 10*3/uL — ABNORMAL HIGH (ref 150–400)
RBC: 3.93 MIL/uL — ABNORMAL LOW (ref 4.22–5.81)
RDW: 12.7 % (ref 11.5–15.5)
WBC: 17 10*3/uL — ABNORMAL HIGH (ref 4.0–10.5)
nRBC: 0 % (ref 0.0–0.2)

## 2022-10-04 LAB — BASIC METABOLIC PANEL
Anion gap: 11 (ref 5–15)
BUN: 26 mg/dL — ABNORMAL HIGH (ref 8–23)
CO2: 23 mmol/L (ref 22–32)
Calcium: 8.9 mg/dL (ref 8.9–10.3)
Chloride: 105 mmol/L (ref 98–111)
Creatinine, Ser: 0.96 mg/dL (ref 0.61–1.24)
GFR, Estimated: >60 mL/min (ref >60)
Glucose, Bld: 119 mg/dL — ABNORMAL HIGH (ref 70–99)
Potassium: 5.1 mmol/L (ref 3.5–5.1)
Sodium: 139 mmol/L (ref 135–145)

## 2022-10-04 LAB — POCT I-STAT, CHEM 8
BUN: 22 mg/dL (ref 8–23)
Calcium, Ion: 1.17 mmol/L (ref 1.15–1.40)
Chloride: 103 mmol/L (ref 98–111)
Creatinine, Ser: 0.7 mg/dL (ref 0.61–1.24)
Glucose, Bld: 120 mg/dL — ABNORMAL HIGH (ref 70–99)
HCT: 31 % — ABNORMAL LOW (ref 39.0–52.0)
Hemoglobin: 10.5 g/dL — ABNORMAL LOW (ref 13.0–17.0)
Potassium: 4.3 mmol/L (ref 3.5–5.1)
Sodium: 140 mmol/L (ref 135–145)
TCO2: 26 mmol/L (ref 22–32)

## 2022-10-04 LAB — POCT ACTIVATED CLOTTING TIME
Activated Clotting Time: 228 seconds
Activated Clotting Time: 239 seconds
Activated Clotting Time: 196 seconds
Activated Clotting Time: 179 seconds
Activated Clotting Time: 163 seconds

## 2022-10-04 LAB — GLUCOSE, CAPILLARY
Glucose-Capillary: 78 mg/dL (ref 70–99)
Glucose-Capillary: 56 mg/dL — ABNORMAL LOW (ref 70–99)
Glucose-Capillary: 141 mg/dL — ABNORMAL HIGH (ref 70–99)
Glucose-Capillary: 121 mg/dL — ABNORMAL HIGH (ref 70–99)
Glucose-Capillary: 51 mg/dL — ABNORMAL LOW (ref 70–99)
Glucose-Capillary: 225 mg/dL — ABNORMAL HIGH (ref 70–99)
Glucose-Capillary: 112 mg/dL — ABNORMAL HIGH (ref 70–99)

## 2022-10-04 LAB — LACTIC ACID, PLASMA: Lactic Acid, Venous: 7.2 mmol/L (ref 0.5–1.9)

## 2022-10-04 LAB — PROCALCITONIN: Procalcitonin: <0.1 ng/mL

## 2022-10-04 LAB — CREATININE, SERUM
Creatinine, Ser: 0.76 mg/dL (ref 0.61–1.24)
GFR, Estimated: >60 mL/min (ref >60)

## 2022-10-04 LAB — BRAIN NATRIURETIC PEPTIDE: B Natriuretic Peptide: 141.5 pg/mL — ABNORMAL HIGH (ref 0.0–100.0)

## 2022-10-04 SURGERY — ABDOMINAL AORTOGRAM W/LOWER EXTREMITY
Anesthesia: LOCAL

## 2022-10-04 MED ORDER — SODIUM CHLORIDE 0.9% FLUSH
3.0000 mL | Freq: Two times a day (BID) | INTRAVENOUS | Status: DC
  Administered 2022-10-05: 3 mL via INTRAVENOUS

## 2022-10-04 MED ORDER — LABETALOL HCL 5 MG/ML IV SOLN
INTRAVENOUS | Status: AC
  Filled 2022-10-04: qty 4

## 2022-10-04 MED ORDER — SODIUM CHLORIDE 0.9 % WEIGHT BASED INFUSION: 1.0000 mL/kg/h | INTRAVENOUS | Status: AC

## 2022-10-04 MED ORDER — LIDOCAINE HCL (PF) 1 % IJ SOLN
INTRAMUSCULAR | Status: AC
  Filled 2022-10-04: qty 30

## 2022-10-04 MED ORDER — SODIUM CHLORIDE 0.9 % IV SOLN: INTRAVENOUS | Status: DC

## 2022-10-04 MED ORDER — HEPARIN (PORCINE) IN NACL 1000-0.9 UT/500ML-% IV SOLN
INTRAVENOUS | Status: DC | PRN
  Administered 2022-10-04 (×2): 500 mL

## 2022-10-04 MED ORDER — DEXTROSE 50 % IV SOLN
1.0000 | Freq: Once | INTRAVENOUS | Status: AC
  Administered 2022-10-04: 50 mL via INTRAVENOUS
  Filled 2022-10-04: qty 50

## 2022-10-04 MED ORDER — SODIUM CHLORIDE 0.9 % IV SOLN: 250.0000 mL | INTRAVENOUS | Status: DC | PRN

## 2022-10-04 MED ORDER — HYDROMORPHONE HCL 1 MG/ML IJ SOLN
0.5000 mg | Freq: Once | INTRAMUSCULAR | Status: AC | PRN
  Administered 2022-10-04: 0.5 mg via INTRAVENOUS
  Filled 2022-10-04: qty 0.5

## 2022-10-04 MED ORDER — HEPARIN SODIUM (PORCINE) 1000 UNIT/ML IJ SOLN
INTRAMUSCULAR | Status: DC | PRN
  Administered 2022-10-04: 6000 [IU] via INTRAVENOUS

## 2022-10-04 MED ORDER — LABETALOL HCL 5 MG/ML IV SOLN
10.0000 mg | INTRAVENOUS | Status: DC | PRN
  Administered 2022-10-04: 10 mg via INTRAVENOUS

## 2022-10-04 MED ORDER — HYDRALAZINE HCL 20 MG/ML IJ SOLN: 5.0000 mg | INTRAMUSCULAR | Status: DC | PRN

## 2022-10-04 MED ORDER — CLOPIDOGREL BISULFATE 75 MG PO TABS
75.0000 mg | ORAL_TABLET | Freq: Every day | ORAL | Status: DC
  Administered 2022-10-05 – 2022-10-11 (×7): 75 mg via ORAL
  Filled 2022-10-04 (×7): qty 1

## 2022-10-04 MED ORDER — DEXTROSE 50 % IV SOLN
INTRAVENOUS | Status: AC
  Filled 2022-10-04: qty 50

## 2022-10-04 MED ORDER — HEPARIN SODIUM (PORCINE) 5000 UNIT/ML IJ SOLN
5000.0000 [IU] | Freq: Three times a day (TID) | INTRAMUSCULAR | Status: DC
  Administered 2022-10-05 – 2022-10-08 (×11): 5000 [IU] via SUBCUTANEOUS
  Filled 2022-10-04 (×12): qty 1

## 2022-10-04 MED ORDER — ONDANSETRON HCL 4 MG/2ML IJ SOLN: 4.0000 mg | Freq: Four times a day (QID) | INTRAMUSCULAR | Status: DC | PRN

## 2022-10-04 MED ORDER — FENTANYL CITRATE (PF) 100 MCG/2ML IJ SOLN
INTRAMUSCULAR | Status: AC
  Filled 2022-10-04: qty 2

## 2022-10-04 MED ORDER — CLOPIDOGREL BISULFATE 300 MG PO TABS
ORAL_TABLET | ORAL | Status: DC | PRN
  Administered 2022-10-04: 300 mg via ORAL

## 2022-10-04 MED ORDER — HYDRALAZINE HCL 20 MG/ML IJ SOLN
INTRAMUSCULAR | Status: DC | PRN
  Administered 2022-10-04: 10 mg via INTRAVENOUS

## 2022-10-04 MED ORDER — CLOPIDOGREL BISULFATE 300 MG PO TABS
ORAL_TABLET | ORAL | Status: AC
  Filled 2022-10-04: qty 1

## 2022-10-04 MED ORDER — HEPARIN SODIUM (PORCINE) 1000 UNIT/ML IJ SOLN
INTRAMUSCULAR | Status: AC
  Filled 2022-10-04: qty 10

## 2022-10-04 MED ORDER — ACETAMINOPHEN 325 MG PO TABS: 650.0000 mg | ORAL_TABLET | ORAL | Status: DC | PRN

## 2022-10-04 MED ORDER — LACTATED RINGERS IV BOLUS
1000.0000 mL | Freq: Once | INTRAVENOUS | Status: AC
  Administered 2022-10-04: 1000 mL via INTRAVENOUS

## 2022-10-04 MED ORDER — HYDRALAZINE HCL 20 MG/ML IJ SOLN
INTRAMUSCULAR | Status: AC
  Filled 2022-10-04: qty 1

## 2022-10-04 MED ORDER — HALOPERIDOL LACTATE 5 MG/ML IJ SOLN: 2.0000 mg | Freq: Four times a day (QID) | INTRAMUSCULAR | Status: DC | PRN

## 2022-10-04 MED ORDER — LABETALOL HCL 5 MG/ML IV SOLN
INTRAVENOUS | Status: DC | PRN
  Administered 2022-10-04 (×2): 10 mg via INTRAVENOUS

## 2022-10-04 MED ORDER — QUETIAPINE FUMARATE 50 MG PO TABS
50.0000 mg | ORAL_TABLET | Freq: Every day | ORAL | Status: DC
  Administered 2022-10-04 – 2022-10-10 (×7): 50 mg via ORAL
  Filled 2022-10-04 (×8): qty 1

## 2022-10-04 MED ORDER — SODIUM CHLORIDE 0.9% FLUSH: 3.0000 mL | INTRAVENOUS | Status: DC | PRN

## 2022-10-04 MED ORDER — FENTANYL CITRATE (PF) 100 MCG/2ML IJ SOLN
INTRAMUSCULAR | Status: DC | PRN
  Administered 2022-10-04: 25 ug via INTRAVENOUS

## 2022-10-04 MED ORDER — LIDOCAINE HCL (PF) 1 % IJ SOLN
INTRAMUSCULAR | Status: DC | PRN
  Administered 2022-10-04: 5 mL

## 2022-10-04 MED ORDER — DEXTROSE 50 % IV SOLN
INTRAVENOUS | Status: DC | PRN
  Administered 2022-10-04: 25 mL via INTRAVENOUS

## 2022-10-04 MED ORDER — NALOXONE HCL 0.4 MG/ML IJ SOLN
INTRAMUSCULAR | Status: AC
  Administered 2022-10-04: 0.4 mg
  Filled 2022-10-04: qty 2

## 2022-10-04 MED ORDER — NALOXONE HCL 0.4 MG/ML IJ SOLN
0.4000 mg | INTRAMUSCULAR | Status: DC | PRN
  Administered 2022-10-04: 0.4 mg via INTRAVENOUS

## 2022-10-04 SURGICAL SUPPLY — 20 items
BALLN MUSTANG 6X80X135 (BALLOONS) ×2
BALLOON MUSTANG 6X80X135 (BALLOONS) IMPLANT
CATH ANGIO 5F PIGTAIL 65CM (CATHETERS) IMPLANT
CATH CROSS OVER TEMPO 5F (CATHETERS) IMPLANT
CATH STRAIGHT 5FR 65CM (CATHETERS) IMPLANT
DEVICE TORQUE .025-.038 (MISCELLANEOUS) IMPLANT
GUIDEWIRE ANGLED .035X260CM (WIRE) IMPLANT
KIT ENCORE 26 ADVANTAGE (KITS) IMPLANT
KIT MICROPUNCTURE NIT STIFF (SHEATH) IMPLANT
KIT PV (KITS) ×2 IMPLANT
SHEATH CATAPULT 6FR 45 (SHEATH) IMPLANT
SHEATH DESTIN LIMA 6FR 45 (SHEATH) IMPLANT
SHEATH PINNACLE 5F 10CM (SHEATH) IMPLANT
SHEATH PINNACLE 6F 10CM (SHEATH) IMPLANT
STENT ELUVIA 7X80X130 (Permanent Stent) IMPLANT
SYR MEDRAD MARK 7 150ML (SYRINGE) ×2 IMPLANT
TRANSDUCER W/STOPCOCK (MISCELLANEOUS) ×2 IMPLANT
TRAY PV CATH (CUSTOM PROCEDURE TRAY) ×2 IMPLANT
WIRE BENTSON .035X145CM (WIRE) IMPLANT
WIRE ROSEN-J .035X260CM (WIRE) IMPLANT

## 2022-10-04 NOTE — Progress Notes (Signed)
Patient CBG recheck at approx 1225. Difficulty obtaining blood from finger stick. Multiple test strip errors. When result was obtained, reading was "LO". Patient was alert with warm dry skin, and oriented per his baseline hx of dementia. Retest code selected on glucometer and Immediately retested and CBG on repeat was 141.

## 2022-10-04 NOTE — H&P (View-Only) (Signed)
  Progress Note    10/04/2022 8:09 AM * No surgery date entered *  Subjective:  no complaints   Vitals:   10/04/22 0449 10/04/22 0455  BP: 104/77   Pulse: (!) 133 97  Resp: 20   Temp: 97.8 F (36.6 C)   SpO2: (!) 84% 100%    Physical Exam: General: resting comfortably Extremities:  L foot wrapped   CBC    Component Value Date/Time   WBC 15.8 (H) 10/04/2022 0417   RBC 3.84 (L) 10/04/2022 0417   HGB 11.3 (L) 10/04/2022 0417   HCT 34.8 (L) 10/04/2022 0417   PLT 404 (H) 10/04/2022 0417   MCV 90.6 10/04/2022 0417   MCH 29.4 10/04/2022 0417   MCHC 32.5 10/04/2022 0417   RDW 12.7 10/04/2022 0417   LYMPHSABS 4.1 (H) 10/04/2022 0417   MONOABS 2.2 (H) 10/04/2022 0417   EOSABS 0.6 (H) 10/04/2022 0417   BASOSABS 0.1 10/04/2022 0417    BMET    Component Value Date/Time   NA 139 10/04/2022 0417   K 5.1 10/04/2022 0417   CL 105 10/04/2022 0417   CO2 23 10/04/2022 0417   GLUCOSE 119 (H) 10/04/2022 0417   BUN 26 (H) 10/04/2022 0417   CREATININE 0.96 10/04/2022 0417   CALCIUM 8.9 10/04/2022 0417   GFRNONAA >60 10/04/2022 0417    INR No results found for: "INR"   Intake/Output Summary (Last 24 hours) at 10/04/2022 0809 Last data filed at 10/04/2022 0549 Gross per 24 hour  Intake 1623.37 ml  Output 1550 ml  Net 73.37 ml      Assessment/Plan:  66 y.o. male with LLE critical limb ischemia with non healing wounds  -Plan is for LLE angiogram today, pending patient guardian consent -Denies any pain in L foot currently -Has been NPO since midnight  Jakell Trusty, PA-C [facility] [phone] 10/04/2022 8:09 AM    
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
## 2022-10-04 NOTE — Progress Notes (Signed)
OT Cancellation Note  Patient Details Name: Jacob Mcgrath. MRN: [id number] DOB: 1956/06/15   Cancelled Treatment:    Reason Eval/Treat Not Completed: Patient at procedure or test/ unavailable. Will continue to follow.   Malka So 10/04/2022, 9:32 AM Cleta Alberts, OTR/L Acute Rehabilitation Services Office: [phone]

## 2022-10-04 NOTE — Progress Notes (Signed)
Site area: rt groin Site Prior to Removal:  Level 0 Pressure Applied For: 20 minutes Manual:   yes Patient Status During Pull:  stable Post Pull Site:  Level 0 Post Pull Instructions Given:  yes Post Pull Pulses Present: rt dp dopplered Dressing Applied:  gauze and tegaderm Bedrest begins @ 1540 Comments:

## 2022-10-04 NOTE — Care Plan (Signed)
Patient personal belongings and cane given to Ocie Bob, RN cath lab.

## 2022-10-04 NOTE — Significant Event (Signed)
Rapid Response Event Note   Reason for Call : Hypotension and decreased LOC Initial Focused Assessment:  Nursing staff notified me of pt with BPs in the 60s with decreased LOC. Upon arrival, pt was only responsive to deep noxious stimuli. CBG 112. Some incomprehensible speech. Dr. Myna Hidalgo notified and orders received for labs and LR bolus 1L. While bolus infusing, pt continued to be very somulent. No evidence of bleeding and right groin is level 0. Pt had angioplasty to his left leg earlier in the day and received fentanyl and Hydromorphone within last 24 hours. Also pt was increased to '50mg'$  Seroquel started this evening at 2000.  Discussed Narcan with Dr. Myna Hidalgo and administered 2 doses (0.'4mg'$ , 0.'4mg'$ ) to Mr. Scaff. He began to answer some questions to verbal stimuli after first dose, then opens eyes and was conversant after 2nd dose. BP slowly improving with bolus continuing.  Following bolus, BP improved.   2338-98.0, HR 101, 136/60 (82), RR 18 with sats 100% on RA   Interventions:  -Narcan 0.'4mg'$  x2 -1L LR bolus      MD Notified: Dr. Myna Hidalgo Call Time: 2221 Arrival Time: 2225 End Time: 2345  Madelynn Done, RN

## 2022-10-04 NOTE — Op Note (Signed)
PATIENT: Jacob Mcgrath.      MRN: [id number] DOB: 1956/06/01    DATE OF PROCEDURE: 10/04/2022  INDICATIONS:    Jacob Mcgrath. is a 67 y.o. male who presented with extensive wounds of the left foot and multilevel arterial occlusive disease.  It was felt that his only chance for limb salvage was arteriography and possible revascularization.  PROCEDURE:    Ultrasound-guided access to the right common femoral artery Aortogram with bilateral iliac arteriogram Selective catheterization of the left external iliac artery (second-order catheterization) with left lower extremity runoff Angioplasty and stenting of the left external iliac artery  SURGEON: Judeth Cornfield. Scot Dock, MD, FACS  ANESTHESIA: Local  EBL: Minimal  TECHNIQUE:  The patient's heart rate, blood pressure, and oxygen saturation were monitored by the nurse continuously during the procedure.  Both groins were prepped and draped in the usual sterile fashion.  Under ultrasound guidance, after the skin was anesthetized, I cannulated the right common femoral artery with a micropuncture needle and a micropuncture sheath was introduced over a wire.  This was exchanged for a 5 [place] sheath over a Bentson wire.  By ultrasound the femoral artery was patent. A real-time image was obtained and sent to the server.  A pigtail catheter was positioned at the L1 vertebral body and flush aortogram obtained.  The catheter was in position above the aortic bifurcation and an oblique iliac projection was obtained.  Next I exchanged the pigtail catheter for a crossover catheter which was positioned into the left common iliac artery.  Left external iliac arteriogram was obtained with left lower extremity runoff.  The patient had tandem stenoses in the external iliac artery.  Each was about 80%.  The common iliac artery had minimal plaque.  The hypogastric artery was patent.  I elected to address the external iliac artery disease with angioplasty  and stenting.  I exchanged the 5 French sheath on the right for a 6 [place] catapulted sheath over a Rosen wire.  This was positioned in the left common iliac artery.  Arteriogram was obtained of the iliac system and I selected a Eluvia 7 mm x 80 mm drug-eluting stent.  This was positioned across the stenoses preserving the hypogastric artery.  This was deployed without difficulty.  Postdilatation was done with a Mustang 6 mm x 80 mm balloon.  Follow-up films showed no residual stenosis.  The sheath was retracted.  The long 6 French sheath was exchanged for a short 6 [place] sheath.  The patient was transferred to the holding area for removal of the sheath.  No immediate complications were noted.  FINDINGS:   Single renal arteries bilaterally with no significant renal artery stenosis identified. The infrarenal aorta and bilateral common iliac arteries are patent.  There is mild stenosis in the left common iliac artery.  The sheath was occlusive on the right and the external iliac artery and the right was not identified.  The hypogastric artery was patent. On the left side, which is the symptomatic side, the external iliac artery had diffuse disease with 2 tight focal stenoses up to 80%.  These were addressed with angioplasty and stenting as described above. Below that, the common femoral artery is patent.  There is a focal stenosis of the origin of the deep femoral artery.  There is mild disease in the proximal superficial femoral artery.  The superficial femoral artery then occludes in the mid thigh with reconstitution of the above-knee popliteal artery.  The popliteal artery is patent.  Dominant  runoff is via the anterior tibial artery which is fairly large.  There is a diseased peroneal artery.  The posterior tibial artery is occluded.      [name], MD, FACS [facility]  DATE OF DICTATION:   10/04/2022

## 2022-10-04 NOTE — Progress Notes (Signed)
Pt CBG 51. Given juice and waiting to recheck at the 15 minute mark. MD made aware and said to hold insulin the rest of the night and go ahead and give amp D50.

## 2022-10-04 NOTE — Progress Notes (Signed)
Pt has become hypotensive. He had been hypertensive earlier and received Toprol at 13:11 and IV labetalol at 16:44. He had angioplasty of left external iliac artery this morning with minimal blood loss.   Plan to give fluid fluid bolus and check labs.

## 2022-10-04 NOTE — Progress Notes (Signed)
PT Cancellation Note  Patient Details Name: Jacob Mcgrath. MRN: [id number] DOB: 26-May-1956   Cancelled Treatment:    Reason Eval/Treat Not Completed: Patient at procedure or test/unavailable. Pt leaving for angiogram on therapist's arrival. PT to re-attempt as time allows.   Lorriane Shire 10/04/2022, 8:49 AM

## 2022-10-04 NOTE — Progress Notes (Signed)
  Progress Note    10/04/2022 8:09 AM * No surgery date entered *  Subjective:  no complaints   Vitals:   10/04/22 0449 10/04/22 0455  BP: 104/77   Pulse: (!) 133 97  Resp: 20   Temp: 97.8 F (36.6 C)   SpO2: (!) 84% 100%    Physical Exam: General: resting comfortably Extremities:  L foot wrapped   CBC    Component Value Date/Time   WBC 15.8 (H) 10/04/2022 0417   RBC 3.84 (L) 10/04/2022 0417   HGB 11.3 (L) 10/04/2022 0417   HCT 34.8 (L) 10/04/2022 0417   PLT 404 (H) 10/04/2022 0417   MCV 90.6 10/04/2022 0417   MCH 29.4 10/04/2022 0417   MCHC 32.5 10/04/2022 0417   RDW 12.7 10/04/2022 0417   LYMPHSABS 4.1 (H) 10/04/2022 0417   MONOABS 2.2 (H) 10/04/2022 0417   EOSABS 0.6 (H) 10/04/2022 0417   BASOSABS 0.1 10/04/2022 0417    BMET    Component Value Date/Time   NA 139 10/04/2022 0417   K 5.1 10/04/2022 0417   CL 105 10/04/2022 0417   CO2 23 10/04/2022 0417   GLUCOSE 119 (H) 10/04/2022 0417   BUN 26 (H) 10/04/2022 0417   CREATININE 0.96 10/04/2022 0417   CALCIUM 8.9 10/04/2022 0417   GFRNONAA >60 10/04/2022 0417    INR No results found for: "INR"   Intake/Output Summary (Last 24 hours) at 10/04/2022 0809 Last data filed at 10/04/2022 0549 Gross per 24 hour  Intake 1623.37 ml  Output 1550 ml  Net 73.37 ml      Assessment/Plan:  67 y.o. male with LLE critical limb ischemia with non healing wounds  -Plan is for LLE angiogram today, pending patient guardian consent -Denies any pain in L foot currently -Has been NPO since midnight  Vicente Serene, PA-C [facility] [phone] 10/04/2022 8:09 AM

## 2022-10-04 NOTE — Progress Notes (Signed)
   10/04/22 1943  Assess: MEWS Score  Temp 98 F (36.7 C)  BP 121/75  MAP (mmHg) 90  Pulse Rate 99  ECG Heart Rate (!) 112  Resp 20  Level of Consciousness Alert  SpO2 96 %  O2 Device Room Air  Assess: MEWS Score  MEWS Temp 0  MEWS Systolic 0  MEWS Pulse 2  MEWS RR 0  MEWS LOC 0  MEWS Score 2  MEWS Score Color Yellow  Assess: if the MEWS score is Yellow or Red  Were vital signs taken at a resting state? Yes  Focused Assessment Change from prior assessment (see assessment flowsheet)  Does the patient meet 2 or more of the SIRS criteria? No  MEWS guidelines implemented  Yes, yellow  Treat  MEWS Interventions Considered administering scheduled or prn medications/treatments as ordered  Take Vital Signs  Increase Vital Sign Frequency  Yellow: Q2hr x1, continue Q4hrs until patient remains green for 12hrs  Escalate  MEWS: Escalate Yellow: Discuss with charge nurse and consider notifying provider and/or RRT  Notify: Charge Nurse/RN  Name of Charge Nurse/RN Notified Christina, RN  Assess: SIRS CRITERIA  SIRS Temperature  0  SIRS Pulse 1  SIRS Respirations  0  SIRS WBC 1  SIRS Score Sum  2

## 2022-10-04 NOTE — Interval H&P Note (Signed)
History and Physical Interval Note:  10/04/2022 9:12 AM  Jacob Mcgrath.  has presented today for surgery, with the diagnosis of claudication.  The various methods of treatment have been discussed with the patient and family. After consideration of risks, benefits and other options for treatment, the patient has consented to  Procedure(s): ABDOMINAL AORTOGRAM W/LOWER EXTREMITY (N/A) as a surgical intervention.  The patient's history has been reviewed, patient examined, no change in status, stable for surgery.  I have reviewed the patient's chart and labs.  Questions were answered to the patient's satisfaction.     [name]

## 2022-10-05 ENCOUNTER — Encounter (HOSPITAL_COMMUNITY): Payer: Self-pay | Admitting: Vascular Surgery

## 2022-10-05 DIAGNOSIS — I998 Other disorder of circulatory system: Secondary | ICD-10-CM | POA: Diagnosis not present

## 2022-10-05 LAB — LIPID PANEL
Cholesterol: 77 mg/dL (ref 0–200)
HDL: 29 mg/dL — ABNORMAL LOW (ref >40)
LDL Cholesterol: 40 mg/dL (ref 0–99)
Total CHOL/HDL Ratio: 2.7 RATIO
Triglycerides: 41 mg/dL (ref <150)
VLDL: 8 mg/dL (ref 0–40)

## 2022-10-05 LAB — DIFFERENTIAL
Abs Immature Granulocytes: 0.05 10*3/uL (ref 0.00–0.07)
Basophils Absolute: 0 10*3/uL (ref 0.0–0.1)
Basophils Relative: 0 %
Eosinophils Absolute: 0.1 10*3/uL (ref 0.0–0.5)
Eosinophils Relative: 1 %
Immature Granulocytes: 0 %
Lymphocytes Relative: 13 %
Lymphs Abs: 1.7 10*3/uL (ref 0.7–4.0)
Monocytes Absolute: 1.7 10*3/uL — ABNORMAL HIGH (ref 0.1–1.0)
Monocytes Relative: 13 %
Neutro Abs: 9.4 10*3/uL — ABNORMAL HIGH (ref 1.7–7.7)
Neutrophils Relative %: 73 %

## 2022-10-05 LAB — LACTIC ACID, PLASMA: Lactic Acid, Venous: 2.3 mmol/L (ref 0.5–1.9)

## 2022-10-05 LAB — CBC
HCT: 14.7 % — ABNORMAL LOW (ref 39.0–52.0)
HCT: 28.8 % — ABNORMAL LOW (ref 39.0–52.0)
Hemoglobin: 4.8 g/dL — CL (ref 13.0–17.0)
Hemoglobin: 9.2 g/dL — ABNORMAL LOW (ref 13.0–17.0)
MCH: 29.8 pg (ref 26.0–34.0)
MCH: 28.7 pg (ref 26.0–34.0)
MCHC: 32.7 g/dL (ref 30.0–36.0)
MCHC: 31.9 g/dL (ref 30.0–36.0)
MCV: 91.3 fL (ref 80.0–100.0)
MCV: 89.7 fL (ref 80.0–100.0)
Platelets: 248 10*3/uL (ref 150–400)
Platelets: 393 10*3/uL (ref 150–400)
RBC: 1.61 MIL/uL — ABNORMAL LOW (ref 4.22–5.81)
RBC: 3.21 MIL/uL — ABNORMAL LOW (ref 4.22–5.81)
RDW: 12.8 % (ref 11.5–15.5)
RDW: 12.6 % (ref 11.5–15.5)
WBC: 8.5 10*3/uL (ref 4.0–10.5)
WBC: 12.9 10*3/uL — ABNORMAL HIGH (ref 4.0–10.5)
nRBC: 0 % (ref 0.0–0.2)
nRBC: 0 % (ref 0.0–0.2)

## 2022-10-05 LAB — BASIC METABOLIC PANEL
Anion gap: 11 (ref 5–15)
BUN: 25 mg/dL — ABNORMAL HIGH (ref 8–23)
CO2: 22 mmol/L (ref 22–32)
Calcium: 8.6 mg/dL — ABNORMAL LOW (ref 8.9–10.3)
Chloride: 105 mmol/L (ref 98–111)
Creatinine, Ser: 1.06 mg/dL (ref 0.61–1.24)
GFR, Estimated: >60 mL/min (ref >60)
Glucose, Bld: 155 mg/dL — ABNORMAL HIGH (ref 70–99)
Potassium: 4.3 mmol/L (ref 3.5–5.1)
Sodium: 138 mmol/L (ref 135–145)

## 2022-10-05 LAB — PROCALCITONIN: Procalcitonin: <0.1 ng/mL

## 2022-10-05 LAB — GLUCOSE, CAPILLARY
Glucose-Capillary: 79 mg/dL (ref 70–99)
Glucose-Capillary: 156 mg/dL — ABNORMAL HIGH (ref 70–99)
Glucose-Capillary: 46 mg/dL — ABNORMAL LOW (ref 70–99)
Glucose-Capillary: 50 mg/dL — ABNORMAL LOW (ref 70–99)
Glucose-Capillary: 185 mg/dL — ABNORMAL HIGH (ref 70–99)
Glucose-Capillary: 99 mg/dL (ref 70–99)

## 2022-10-05 LAB — MAGNESIUM: Magnesium: 1.6 mg/dL — ABNORMAL LOW (ref 1.7–2.4)

## 2022-10-05 LAB — BRAIN NATRIURETIC PEPTIDE: B Natriuretic Peptide: 85.3 pg/mL (ref 0.0–100.0)

## 2022-10-05 LAB — C-REACTIVE PROTEIN: CRP: 7.5 mg/dL — ABNORMAL HIGH (ref <1.0)

## 2022-10-05 LAB — ABO/RH: ABO/RH(D): O POS

## 2022-10-05 MED ORDER — DEXTROSE 50 % IV SOLN: 1.0000 | Freq: Once | INTRAVENOUS | Status: AC

## 2022-10-05 MED ORDER — METOPROLOL SUCCINATE ER 25 MG PO TB24
12.5000 mg | ORAL_TABLET | Freq: Every morning | ORAL | Status: DC
  Administered 2022-10-05 – 2022-10-06 (×2): 12.5 mg via ORAL
  Filled 2022-10-05 (×2): qty 1

## 2022-10-05 MED ORDER — TRAMADOL HCL 50 MG PO TABS
50.0000 mg | ORAL_TABLET | Freq: Two times a day (BID) | ORAL | Status: DC | PRN
  Administered 2022-10-06 – 2022-10-11 (×4): 50 mg via ORAL
  Filled 2022-10-05 (×4): qty 1

## 2022-10-05 MED ORDER — ENSURE ENLIVE PO LIQD
237.0000 mL | Freq: Two times a day (BID) | ORAL | Status: DC
  Administered 2022-10-05 – 2022-10-11 (×11): 237 mL via ORAL

## 2022-10-05 MED ORDER — DEXTROSE 50 % IV SOLN
INTRAVENOUS | Status: AC
  Administered 2022-10-05: 50 mL via INTRAVENOUS
  Filled 2022-10-05: qty 50

## 2022-10-05 MED ORDER — MAGNESIUM SULFATE 4 GM/100ML IV SOLN
4.0000 g | Freq: Once | INTRAVENOUS | Status: AC
  Administered 2022-10-05: 4 g via INTRAVENOUS
  Filled 2022-10-05: qty 100

## 2022-10-05 MED FILL — Labetalol HCl IV Soln Prefilled Syringe 20 MG/4ML (5 MG/ML): INTRAVENOUS | Qty: 4 | Status: AC

## 2022-10-05 NOTE — Evaluation (Signed)
Occupational Therapy Evaluation Patient Details Name: Jacob Mcgrath. MRN: [id number] DOB: August 21, 1955 Today's Date: 10/05/2022   History of Present Illness Pt is a 67 y/o male who presents 3/8 with nonhealing wounds on the LLE. PMH significant for DM, HTN.  Patient s/p Angioplasty and stenting of the left external iliac artery 3/11.   Clinical Impression   Patient admitted for the diagnosis above.  PTA he resides at a local ALF.  Per the daughter, patient was walking with a cane, and could participate with his ADL until he started having issues with his L foot.  Patient is unreliable historian given dementia, but OT will assume wheelchair level at the facility, and assist as needed for ADL completion and toileting.  Currently he is needing Max A for stand pivot transfer, and Max A for lower body ADL at bedlevel.  Patient is very lethargic this date, and discharge disposition as of today would be SNF for post acute rehab.  OT is indicated in the acute setting to address deficits listed, and assist with transition to next level of care.        Recommendations for follow up therapy are one component of a multi-disciplinary discharge planning process, led by the attending physician.  Recommendations may be updated based on patient status, additional functional criteria and insurance authorization.   Follow Up Recommendations  Skilled nursing-short term rehab (<3 hours/day)     Assistance Recommended at Discharge Frequent or constant Supervision/Assistance  Patient can return home with the following Assist for transportation;Two people to help with walking and/or transfers;A lot of help with bathing/dressing/bathroom    Functional Status Assessment  Patient has had a recent decline in their functional status and demonstrates the ability to make significant improvements in function in a reasonable and predictable amount of time.  Equipment Recommendations  None recommended by OT     Recommendations for Other Services       Precautions / Restrictions Precautions Precautions: Fall Restrictions Weight Bearing Restrictions: Yes RLE Weight Bearing: Weight bearing as tolerated LLE Weight Bearing: Weight bearing as tolerated Other Position/Activity Restrictions: L leg and arm contractures      Mobility Bed Mobility Overal bed mobility: Needs Assistance Bed Mobility: Supine to Sit     Supine to sit: Mod assist          Transfers Overall transfer level: Needs assistance   Transfers: Sit to/from Stand, Bed to chair/wheelchair/BSC Sit to Stand: Max assist   Squat pivot transfers: Max assist              Balance Overall balance assessment: Needs assistance Sitting-balance support: No upper extremity supported, Feet supported Sitting balance-Leahy Scale: Poor   Postural control: Posterior lean, Right lateral lean Standing balance support: Bilateral upper extremity supported Standing balance-Leahy Scale: Zero                             ADL either performed or assessed with clinical judgement   ADL Overall ADL's : Needs assistance/impaired     Grooming: Wash/dry hands;Wash/dry face;Minimal assistance;Sitting       Lower Body Bathing: Maximal assistance;Bed level   Upper Body Dressing : Moderate assistance;Sitting   Lower Body Dressing: Maximal assistance;Bed level   Toilet Transfer: Maximal assistance;Squat-pivot                   Vision   Vision Assessment?: No apparent visual deficits     Perception  Praxis      Pertinent Vitals/Pain Pain Assessment Pain Assessment: Faces Faces Pain Scale: Hurts little more Pain Location: L leg Pain Descriptors / Indicators: Tender Pain Intervention(s): Monitored during session     Hand Dominance Right   Extremity/Trunk Assessment Upper Extremity Assessment Upper Extremity Assessment: LUE deficits/detail LUE Deficits / Details: Atrophied, hand, elbow flexion  contracture and weakness up to shoulder. LUE Coordination: decreased fine motor;decreased gross motor   Lower Extremity Assessment Lower Extremity Assessment: Defer to PT evaluation   Cervical / Trunk Assessment Cervical / Trunk Assessment: Kyphotic   Communication Communication Communication: No difficulties   Cognition Arousal/Alertness: Awake/alert Behavior During Therapy: WFL for tasks assessed/performed Overall Cognitive Status: History of cognitive impairments - at baseline                                 General Comments: Dementia at baseline.  Following commands with increased time and cues.     General Comments   HR to 103 with mobility.  Startles very easily.      Exercises     Shoulder Instructions      Home Living Family/patient expects to be discharged to:: Assisted living   Available Help at Discharge: Available 24 hours/day Type of Home: Assisted living Home Access: Level entry           Bathroom Shower/Tub: Walk-in shower   Bathroom Toilet: Handicapped height     Home Equipment: Conservation officer, nature (2 wheels);Cane - single point;Shower seat;Wheelchair - manual;Grab bars - tub/shower;Grab bars - toilet          Prior Functioning/Environment               Mobility Comments: Daughter stating patint was walking with a SPC last time she saw him.  Patient unreliable historian. ADLs Comments: Assume assist as needed for toileting and ADL.  Unalb to get accurate status.        OT Problem List: Decreased strength;Decreased range of motion;Decreased activity tolerance;Impaired balance (sitting and/or standing);Decreased knowledge of use of DME or AE;Decreased safety awareness;Decreased cognition;Pain;Impaired UE functional use      OT Treatment/Interventions: Self-care/ADL training;Therapeutic activities;Patient/family education;DME and/or AE instruction;Balance training    OT Goals(Current goals can be found in the care plan section)  Acute Rehab OT Goals OT Goal Formulation: Patient unable to participate in goal setting Time For Goal Achievement: 10/19/22 Potential to Achieve Goals: Good ADL Goals Pt Will Perform Grooming: with set-up;sitting Pt Will Perform Upper Body Dressing: with set-up;sitting Pt Will Perform Lower Body Dressing: with min assist;sitting/lateral leans Pt Will Transfer to Toilet: with min assist;stand pivot transfer;bedside commode  OT Frequency: Min 2X/week    Co-evaluation              AM-PAC OT "6 Clicks" Daily Activity     Outcome Measure Help from another person eating meals?: A Little Help from another person taking care of personal grooming?: A Little Help from another person toileting, which includes using toliet, bedpan, or urinal?: A Lot Help from another person bathing (including washing, rinsing, drying)?: A Lot Help from another person to put on and taking off regular upper body clothing?: A Lot Help from another person to put on and taking off regular lower body clothing?: A Lot 6 Click Score: 14   End of Session Equipment Utilized During Treatment: Gait belt Nurse Communication: Mobility status  Activity Tolerance: Patient limited by lethargy Patient left: in chair;with  call bell/phone within reach;with chair alarm set;with nursing/sitter in room  OT Visit Diagnosis: Unsteadiness on feet (R26.81);Muscle weakness (generalized) (M62.81);History of falling (Z91.81);Pain Pain - Right/Left: Left Pain - part of body: Leg                Time: [id number] OT Time Calculation (min): 22 min Charges:  OT General Charges $OT Visit: 1 Visit OT Evaluation $OT Eval Moderate Complexity: 1 Mod  10/05/2022  RP, OTR/L  Acute Rehabilitation Services  Office:  [phone]   Metta Clines 10/05/2022, 10:00 AM

## 2022-10-05 NOTE — Progress Notes (Signed)
   10/04/22 2212  Assess: MEWS Score  Temp 98.3 F (36.8 C)  BP (!) 72/51  MAP (mmHg) (!) 58  ECG Heart Rate (!) 112  Resp 14  Level of Consciousness Responds to Pain  O2 Device Room Air  Assess: MEWS Score  MEWS Temp 0  MEWS Systolic 2  MEWS Pulse 2  MEWS RR 0  MEWS LOC 2  MEWS Score 6  MEWS Score Color Red  Assess: if the MEWS score is Yellow or Red  Were vital signs taken at a resting state? Yes  Focused Assessment Change from prior assessment (see assessment flowsheet)  Does the patient meet 2 or more of the SIRS criteria? No  MEWS guidelines implemented  Yes, red  Treat  MEWS Interventions Considered administering scheduled or prn medications/treatments as ordered  Take Vital Signs  Increase Vital Sign Frequency  Red: Q1hr x2, continue Q4hrs until patient remains green for 12hrs  Escalate  MEWS: Escalate Red: Discuss with charge nurse and notify provider. Consider notifying RRT. If remains red for 2 hours consider need for higher level of care  Notify: Charge Nurse/RN  Name of Charge Nurse/RN Notified [place], RN  Provider Notification  Provider Name/Title Opyd, MD  Date Provider Notified 10/04/22  Time Provider Notified 2220  Method of Notification Page  Notification Reason Change in status  Provider response See new orders  Date of Provider Response 10/04/22  Time of Provider Response 2226  Notify: Rapid Response  Name of Rapid Response RN Notified David, RN  Date Rapid Response Notified 10/04/22  Time Rapid Response Notified 2220  Assess: SIRS CRITERIA  SIRS Temperature  0  SIRS Pulse 1  SIRS Respirations  0  SIRS WBC 1  SIRS Score Sum  2
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
## 2022-10-05 NOTE — Progress Notes (Addendum)
  Progress Note    10/05/2022 8:10 AM 1 Day Post-Op  Subjective:  still having some pain in the left foot   Vitals:   10/05/22 0238 10/05/22 0351  BP: 118/60 119/68  Pulse: (!) 109 100  Resp:  16  Temp:  98.7 F (37.1 C)  SpO2: 98% 100%    Physical Exam: General:  no acute distress Lungs:  nonlabored Incisions:  R groin cath site soft without hematoma Extremities:  L foot warm and well perfused. Wounds are bandaged   CBC    Component Value Date/Time   WBC 12.9 (H) 10/05/2022 0103   RBC 3.21 (L) 10/05/2022 0103   HGB 9.2 (L) 10/05/2022 0103   HCT 28.8 (L) 10/05/2022 0103   PLT 393 10/05/2022 0103   MCV 89.7 10/05/2022 0103   MCH 28.7 10/05/2022 0103   MCHC 31.9 10/05/2022 0103   RDW 12.6 10/05/2022 0103   LYMPHSABS 1.7 10/05/2022 0103   MONOABS 1.7 (H) 10/05/2022 0103   EOSABS 0.1 10/05/2022 0103   BASOSABS 0.0 10/05/2022 0103    BMET    Component Value Date/Time   NA 138 10/05/2022 0103   K 4.3 10/05/2022 0103   CL 105 10/05/2022 0103   CO2 22 10/05/2022 0103   GLUCOSE 155 (H) 10/05/2022 0103   BUN 25 (H) 10/05/2022 0103   CREATININE 1.06 10/05/2022 0103   CALCIUM 8.6 (L) 10/05/2022 0103   GFRNONAA >60 10/05/2022 0103    INR No results found for: "INR"   Intake/Output Summary (Last 24 hours) at 10/05/2022 0810 Last data filed at 10/05/2022 0404 Gross per 24 hour  Intake 560 ml  Output 750 ml  Net -190 ml      Assessment/Plan:  67 y.o. male is 1 day post op, s/p: left external iliac artery angioplasty and stenting   -Successfully underwent left external iliac artery stenting yesterday, hopefully to improve blood flow to heal LLE wounds -L foot warm and well perfused. Improved left femoral pulse -Continue ASA, plavix, and statin for stent patency   Vicente Serene, PA-C [facility] [phone] 10/05/2022 8:10 AM  VASCULAR STAFF ADDENDUM: I have independently interviewed and examined the patient. I agree with the  above.  Angiogram findings reviewed in detail with Dr. Scot Dock. Good technical result from iliac intervention. He has a SFA occlusion that appears treatable endovascularly. He is not a great open surgical candidate. Will plan for repeat angiography later this week with me Friday.  [name]. Stanford Breed, MD [facility] Phone Number: [phone] 10/05/2022 11:32 AM

## 2022-10-05 NOTE — Care Management Important Message (Signed)
Important Message  Patient Details  Name: Jacob Mcgrath. MRN: [id number] Date of Birth: 1956/03/31   Medicare Important Message Given:  Yes     [name] 10/05/2022, 8:23 AM

## 2022-10-05 NOTE — Progress Notes (Signed)
CBG 46 at 1559, 8oz OJ given. Recheck CBG 50 at 1628, another 8oz OJ given. MD Candiss Norse notified, verbal orders to given 1amp D50 - given at 1645.

## 2022-10-05 NOTE — Progress Notes (Signed)
Physical Therapy Treatment Patient Details Name: Jacob Mcgrath. MRN: [id number] DOB: 1956/04/20 Today's Date: 10/05/2022   History of Present Illness Pt is a 67 y/o male who presents 3/8 with nonhealing wounds on the LLE. PMH significant for DM, HTN.  Patient s/p Angioplasty and stenting of the left external iliac artery 3/11.    PT Comments    Pt with very poor mobility due to lt sided weakness and painful lt foot that his is unable to bear significant weight on. Takes to people to get him into standing and to chair. Recommend SNF at DC.    Recommendations for follow up therapy are one component of a multi-disciplinary discharge planning process, led by the attending physician.  Recommendations may be updated based on patient status, additional functional criteria and insurance authorization.  Follow Up Recommendations  Skilled nursing-short term rehab (<3 hours/day) Can patient physically be transported by private vehicle: No   Assistance Recommended at Discharge Frequent or constant Supervision/Assistance  Patient can return home with the following Two people to help with walking and/or transfers;Assist for transportation;A lot of help with bathing/dressing/bathroom   Equipment Recommendations  Wheelchair (measurements PT);Wheelchair cushion (measurements PT) (if he doesn't have one)    Recommendations for Other Services       Precautions / Restrictions Precautions Precautions: Fall Restrictions Weight Bearing Restrictions: Yes RLE Weight Bearing: Weight bearing as tolerated LLE Weight Bearing: Weight bearing as tolerated Other Position/Activity Restrictions: L leg and arm contractures     Mobility  Bed Mobility               General bed mobility comments: Pt up in chair    Transfers Overall transfer level: Needs assistance Equipment used: Ambulation equipment used (bed rail)   Sit to Stand: +2 physical assistance, Mod assist           General  transfer comment: Initially attempted standind with Stedy but pt unable to stand up fully due to painful lt foot and unable to place weight on his heel due to pain. Then stood with pt holding back of chair with assist to bring hips up and for balance. Pt placing only lt toes/forefoot on ground. Stood 2 more times using bed rail in front of him to pull up with RUE into stand with assist to bring hips up and for balance. Stands with weight primarily on rt foot and only lt toes/forefoot on ground.    Ambulation/Gait               General Gait Details: Unable   Marine scientist Rankin (Stroke Patients Only)       Balance Overall balance assessment: Needs assistance Sitting-balance support: Feet supported, Single extremity supported Sitting balance-Leahy Scale: Poor Sitting balance - Comments: RUE support   Standing balance support: Single extremity supported Standing balance-Leahy Scale: Poor Standing balance comment: +2 mod assist for standing holding on to bed rail with rt hand.                            Cognition Arousal/Alertness: Awake/alert Behavior During Therapy: WFL for tasks assessed/performed Overall Cognitive Status: History of cognitive impairments - at baseline                                 General Comments:  Dementia at baseline.  Following commands with increased time and cues.        Exercises      General Comments        Pertinent Vitals/Pain Pain Assessment Pain Assessment: Faces Faces Pain Scale: Hurts even more Pain Location: lt foot Pain Descriptors / Indicators: Grimacing, Guarding Pain Intervention(s): Limited activity within patient's tolerance, Repositioned, Monitored during session    [place] expects to be discharged to:: Assisted living   Available Help at Discharge: Available 24 hours/day Type of Home: Assisted living Home Access: Level entry          Home Equipment: Conservation officer, nature (2 wheels);Cane - single point;Shower seat;Wheelchair - manual;Grab bars - tub/shower;Grab bars - toilet      Prior Function            PT Goals (current goals can now be found in the care plan section) Acute Rehab PT Goals Patient Stated Goal: None stated Progress towards PT goals: Progressing toward goals    Frequency    Min 2X/week      PT Plan Current plan remains appropriate    Co-evaluation              AM-PAC PT "6 Clicks" Mobility   Outcome Measure  Help needed turning from your back to your side while in a flat bed without using bedrails?: A Little Help needed moving from lying on your back to sitting on the side of a flat bed without using bedrails?: A Lot Help needed moving to and from a bed to a chair (including a wheelchair)?: Total Help needed standing up from a chair using your arms (e.g., wheelchair or bedside chair)?: Total Help needed to walk in hospital room?: Total Help needed climbing 3-5 steps with a railing? : Total 6 Click Score: 9    End of Session Equipment Utilized During Treatment: Gait belt Activity Tolerance: Patient tolerated treatment well Patient left: in chair;with call bell/phone within reach;with chair alarm set   PT Visit Diagnosis: Unsteadiness on feet (R26.81);Pain Pain - Right/Left: Left Pain - part of body: Ankle and joints of foot     Time: [id number] PT Time Calculation (min) (ACUTE ONLY): 21 min  Charges:  $Therapeutic Activity: 8-22 mins                     [place] 10/05/2022, 1:00 PM
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
## 2022-10-05 NOTE — TOC Initial Note (Addendum)
Transition of Care (TOC) - Initial/Assessment Note    Patient Details  Name: Jacob Mcgrath. MRN: [id number] Date of Birth: Aug 20, 1955  Transition of Care [facility]) CM/SW Contact:    Milas Gain, [place] Phone Number: 10/05/2022, 2:38 PM  Clinical Narrative:                  CSW received consult for possible SNF placement at time of discharge. Due to patients current orientation CSW spoke with patients legal guardian Nevin Bloodgood regarding PT recommendation of SNF placement at time of discharge. Nevin Bloodgood reports patient lives at [place].Patients legal guardian expressed understanding of PT recommendation and is agreeable to SNF placement for patient at time of discharge. Patients legal guardian gave CSW permission to fax out initial referral near the [facility] area for possible SNF placement. CSW discussed insurance authorization process No further questions reported at this time. CSW to continue to follow and assist with discharge planning needs.   Expected Discharge Plan: [place]     Patient Goals and CMS Choice            Expected Discharge Plan and Services                                              Prior Living Arrangements/Services                       Activities of Daily Living Home Assistive Devices/Equipment: Cane (specify quad or straight), Wheelchair (straight) ADL Screening (condition at time of admission) Patient's cognitive ability adequate to safely complete daily activities?: No Is the patient deaf or have difficulty hearing?: No Does the patient have difficulty seeing, even when wearing glasses/contacts?: No Does the patient have difficulty concentrating, remembering, or making decisions?: Yes Patient able to express need for assistance with ADLs?: Yes Does the patient have difficulty dressing or bathing?: Yes Independently performs ADLs?: No Does the patient have difficulty walking or climbing stairs?:  Yes Weakness of Legs: Left Weakness of Arms/Hands: Left  Permission Sought/Granted                  Emotional Assessment              Admission diagnosis:  Ischemic leg [I99.8] Patient Active Problem List   Diagnosis Date Noted   Ischemic leg 10/01/2022   Hypertension 10/01/2022   Diabetes mellitus without complication ([place]) [id number]   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

## 2022-10-06 ENCOUNTER — Inpatient Hospital Stay: Payer: Self-pay

## 2022-10-06 DIAGNOSIS — I998 Other disorder of circulatory system: Secondary | ICD-10-CM | POA: Diagnosis not present

## 2022-10-06 LAB — CBC WITH DIFFERENTIAL/PLATELET
Abs Immature Granulocytes: 0.06 10*3/uL (ref 0.00–0.07)
Basophils Absolute: 0.1 10*3/uL (ref 0.0–0.1)
Basophils Relative: 0 %
Eosinophils Absolute: 0.5 10*3/uL (ref 0.0–0.5)
Eosinophils Relative: 3 %
HCT: 30.5 % — ABNORMAL LOW (ref 39.0–52.0)
Hemoglobin: 9.7 g/dL — ABNORMAL LOW (ref 13.0–17.0)
Immature Granulocytes: 0 %
Lymphocytes Relative: 24 %
Lymphs Abs: 4 10*3/uL (ref 0.7–4.0)
MCH: 28.8 pg (ref 26.0–34.0)
MCHC: 31.8 g/dL (ref 30.0–36.0)
MCV: 90.5 fL (ref 80.0–100.0)
Monocytes Absolute: 2.6 10*3/uL — ABNORMAL HIGH (ref 0.1–1.0)
Monocytes Relative: 15 %
Neutro Abs: 9.8 10*3/uL — ABNORMAL HIGH (ref 1.7–7.7)
Neutrophils Relative %: 58 %
Platelets: 384 10*3/uL (ref 150–400)
RBC: 3.37 MIL/uL — ABNORMAL LOW (ref 4.22–5.81)
RDW: 13.1 % (ref 11.5–15.5)
WBC: 17.1 10*3/uL — ABNORMAL HIGH (ref 4.0–10.5)
nRBC: 0 % (ref 0.0–0.2)

## 2022-10-06 LAB — BASIC METABOLIC PANEL
Anion gap: 14 (ref 5–15)
BUN: 31 mg/dL — ABNORMAL HIGH (ref 8–23)
CO2: 21 mmol/L — ABNORMAL LOW (ref 22–32)
Calcium: 9.2 mg/dL (ref 8.9–10.3)
Chloride: 105 mmol/L (ref 98–111)
Creatinine, Ser: 1.02 mg/dL (ref 0.61–1.24)
GFR, Estimated: >60 mL/min (ref >60)
Glucose, Bld: 138 mg/dL — ABNORMAL HIGH (ref 70–99)
Potassium: 4.5 mmol/L (ref 3.5–5.1)
Sodium: 140 mmol/L (ref 135–145)

## 2022-10-06 LAB — CULTURE, BLOOD (ROUTINE X 2)
Culture: NO GROWTH
Culture: NO GROWTH
Special Requests: ADEQUATE
Special Requests: ADEQUATE

## 2022-10-06 LAB — C-REACTIVE PROTEIN: CRP: 6.1 mg/dL — ABNORMAL HIGH (ref <1.0)

## 2022-10-06 LAB — BRAIN NATRIURETIC PEPTIDE: B Natriuretic Peptide: 53.8 pg/mL (ref 0.0–100.0)

## 2022-10-06 LAB — PROCALCITONIN: Procalcitonin: <0.1 ng/mL

## 2022-10-06 LAB — GLUCOSE, CAPILLARY
Glucose-Capillary: 67 mg/dL — ABNORMAL LOW (ref 70–99)
Glucose-Capillary: 100 mg/dL — ABNORMAL HIGH (ref 70–99)
Glucose-Capillary: 156 mg/dL — ABNORMAL HIGH (ref 70–99)
Glucose-Capillary: 113 mg/dL — ABNORMAL HIGH (ref 70–99)
Glucose-Capillary: 189 mg/dL — ABNORMAL HIGH (ref 70–99)

## 2022-10-06 LAB — TSH: TSH: 2.543 u[IU]/mL (ref 0.350–4.500)

## 2022-10-06 LAB — MAGNESIUM: Magnesium: 2.1 mg/dL (ref 1.7–2.4)

## 2022-10-06 LAB — CORTISOL: Cortisol, Plasma: 11.6 ug/dL

## 2022-10-06 MED ORDER — MIDODRINE HCL 5 MG PO TABS
10.0000 mg | ORAL_TABLET | ORAL | Status: AC
  Administered 2022-10-06: 10 mg via ORAL
  Filled 2022-10-06: qty 2

## 2022-10-06 MED ORDER — LACTATED RINGERS IV BOLUS
1000.0000 mL | Freq: Once | INTRAVENOUS | Status: AC
  Administered 2022-10-06: 1000 mL via INTRAVENOUS

## 2022-10-06 MED ORDER — CHLORHEXIDINE GLUCONATE CLOTH 2 % EX PADS
6.0000 | MEDICATED_PAD | Freq: Every day | CUTANEOUS | Status: DC
  Administered 2022-10-06 – 2022-10-10 (×5): 6 via TOPICAL

## 2022-10-06 MED ORDER — METOPROLOL TARTRATE 50 MG PO TABS
50.0000 mg | ORAL_TABLET | Freq: Two times a day (BID) | ORAL | Status: DC
  Administered 2022-10-06: 50 mg via ORAL
  Filled 2022-10-06 (×2): qty 1

## 2022-10-06 MED ORDER — SODIUM CHLORIDE 0.9% FLUSH: 10.0000 mL | INTRAVENOUS | Status: DC | PRN

## 2022-10-06 MED ORDER — SODIUM CHLORIDE 0.9 % IV BOLUS
500.0000 mL | INTRAVENOUS | Status: AC
  Administered 2022-10-06: 500 mL via INTRAVENOUS

## 2022-10-06 MED ORDER — SODIUM CHLORIDE 0.9% FLUSH
10.0000 mL | Freq: Two times a day (BID) | INTRAVENOUS | Status: DC
  Administered 2022-10-06: 10 mL

## 2022-10-06 MED ORDER — SODIUM CHLORIDE 0.9 % IV BOLUS: 500.0000 mL | Freq: Once | INTRAVENOUS | Status: DC

## 2022-10-06 NOTE — Progress Notes (Signed)
Consent for PICC obtained via telephone from legal guardian Ova Freshwater with [facility] [employer] [place].

## 2022-10-06 NOTE — TOC Progression Note (Addendum)
Transition of Care (TOC) - Progression Note    Patient Details  Name: Jacob Mcgrath. MRN: [id number] Date of Birth: 06-29-1956  Transition of Care [facility]) CM/SW [place], [place] Phone Number: 10/06/2022, 1:47 PM  Clinical Narrative:     CSW LVM with patients legal guardian Nevin Bloodgood. CSW awaiting call back to discuss SNF bed offers for patient. CSW will continue to follow and assist with patients dc planning needs.  Update- CSW received call from patients legal guardian Nevin Bloodgood. Nevin Bloodgood confirmed she is patients legal guardian. CSW requested if she can bring in guardianship paperwork so that we can have on file.Nevin Bloodgood confirmed she will send in paperwork. CSW provided [place] with patients short term rehab bed offers. Nevin Bloodgood accepted SNF bed offer with [facility] for patient. All questions answered. No further questions reported at this time.   CSW received guardianship paperwork. Copy placed in patients hard chart.  Expected Discharge Plan: [place]    Expected Discharge Plan and Services                                               Social Determinants of Health (SDOH) Interventions SDOH Screenings   Tobacco Use: High Risk (10/05/2022)    Readmission Risk Interventions     No data to display

## 2022-10-06 NOTE — NC FL2 (Addendum)
[place] LEVEL OF CARE FORM     IDENTIFICATION  Patient Name: Jacob Mcgrath. Birthdate: [birth year] Sex: male Admission Date (Current Location): 10/01/2022  [facility] and [place] Number:  Herbalist and Address:  [facility] [place] [address] [place], [place] [address]      Provider Number: [id number]  Attending Physician Name and Address:  Thurnell Lose, MD  Relative Name and Phone Number:  Nevin Bloodgood [phone]    Current Level of Care: Hospital Recommended Level of Care: Nursing Facility Prior Approval Number:    Date Approved/Denied:   PASRR Number:   [id number]  Discharge Plan: SNF    Current Diagnoses: Patient Active Problem List   Diagnosis Date Noted   Ischemic leg 10/01/2022   Hypertension 10/01/2022   Diabetes mellitus without complication ([place]) Q000111Q   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 ([place]) 10/01/2022    Orientation RESPIRATION BLADDER Height & Weight     Self, Time, Place  Normal Incontinent, External catheter Weight: 127 lb 12.8 oz (58 kg) Height:  5\' 9"  (175.3 cm)  BEHAVIORAL SYMPTOMS/MOOD NEUROLOGICAL BOWEL NUTRITION STATUS      Incontinent Diet (see dc summary)  AMBULATORY STATUS COMMUNICATION OF NEEDS Skin   Extensive Assist Verbally Other (Comment) (PI 10/04/22 Sacrum medial stage 1   Open wound 10/01/22 anterior foot, left open wounds LLE  Open wound 10/03/22 diabetic ulcer heel left  Open wound 10/03/22 non pressure wound pretiabial distal left)                       Personal Care Assistance Level of Assistance  Bathing, Feeding, Dressing Bathing Assistance: Maximum assistance Feeding assistance: Limited assistance Dressing Assistance: Limited assistance     Functional Limitations Info  Sight, Hearing, Speech Sight Info: Adequate Hearing Info: Adequate Speech Info: Adequate    SPECIAL CARE FACTORS FREQUENCY  PT (By licensed PT), OT (By licensed OT)     PT  Frequency: 5x week OT Frequency: 5x week            Contractures Contractures Info: Not present    Additional Factors Info  Code Status, Allergies, Psychotropic, Insulin Sliding Scale Code Status Info: Full Allergies Info: Amoxicillin  Ampicillin  Tetracyclines & Related Psychotropic Info: DULoxetine (CYMBALTA) DR capsule 30 mg  QUEtiapine (SEROQUEL) tablet 50 mg Insulin Sliding Scale Info: see dc summary       Current Medications (10/06/2022):  This is the current hospital active medication list Current Facility-Administered Medications  Medication Dose Route Frequency Provider Last Rate Last Admin   acetaminophen (TYLENOL) tablet 650 mg  650 mg Oral Q8H PRN Thurnell Lose, MD   650 mg at 10/06/22 0250   aspirin EC tablet 81 mg  81 mg Oral Daily Truett Mainland, DO   81 mg at 10/06/22 0825   atorvastatin (LIPITOR) tablet 80 mg  80 mg Oral Daily Truett Mainland, DO   80 mg at 10/06/22 0825   cefTRIAXone (ROCEPHIN) 2 g in sodium chloride 0.9 % 100 mL IVPB  2 g Intravenous Q24H Truett Mainland, DO   Stopped at 10/05/22 2022   And   metroNIDAZOLE (FLAGYL) IVPB 500 mg  500 mg Intravenous Q12H Truett Mainland, DO   Stopped at 10/05/22 2310   Chlorhexidine Gluconate Cloth 2 % PADS 6 each  6 each Topical Daily Thurnell Lose, MD       clopidogrel (PLAVIX) tablet 75 mg  75 mg Oral Q breakfast Angelia Mould, MD   75 mg at 10/06/22 [id number]   DULoxetine (CYMBALTA) DR capsule 30 mg  30 mg Oral Daily Truett Mainland, DO   30 mg at 10/06/22 [id number]   feeding supplement (ENSURE ENLIVE / ENSURE PLUS) liquid 237 mL  237 mL Oral BID BM Thurnell Lose, MD   237 mL at 10/06/22 1146   gabapentin (NEURONTIN) capsule 300 mg  300 mg Oral TID Truett Mainland, DO   300 mg at 10/06/22 [id number]   haloperidol lactate (HALDOL) injection 2 mg  2 mg Intramuscular Q6H PRN Thurnell Lose, MD       heparin injection 5,000 Units  5,000 Units Subcutaneous Q8H Angelia Mould, MD   5,000 Units at  10/06/22 0602   hydrALAZINE (APRESOLINE) injection 5 mg  5 mg Intravenous Q20 Min PRN Angelia Mould, MD       labetalol (NORMODYNE) injection 10 mg  10 mg Intravenous Q10 min PRN Angelia Mould, MD   10 mg at 10/04/22 1644   levETIRAcetam (KEPPRA) tablet 500 mg  500 mg Oral BID Truett Mainland, DO   500 mg at 10/06/22 [id number]   metoprolol tartrate (LOPRESSOR) tablet 50 mg  50 mg Oral BID Thurnell Lose, MD   50 mg at 10/06/22 1142   multivitamin with minerals tablet 1 tablet  1 tablet Oral Daily Thurnell Lose, MD   1 tablet at 10/06/22 0825   naloxone [facility]) injection 0.4 mg  0.4 mg Intravenous PRN Thurnell Lose, MD   0.4 mg at 10/04/22 2303   nicotine (NICODERM CQ - dosed in mg/24 hours) patch 21 mg  21 mg Transdermal Daily Opyd, Ilene Qua, MD   21 mg at 10/06/22 0827   nicotine polacrilex (NICORETTE) gum 2 mg  2 mg Oral PRN Opyd, Ilene Qua, MD       ondansetron (ZOFRAN) injection 4 mg  4 mg Intravenous Q6H PRN Stinson, Jacob J, DO       pantoprazole (PROTONIX) EC tablet 40 mg  40 mg Oral Daily Truett Mainland, DO   40 mg at 10/06/22 [id number]   PARoxetine (PAXIL) tablet 10 mg  10 mg Oral Daily Truett Mainland, DO   10 mg at 10/06/22 0827   QUEtiapine (SEROQUEL) tablet 50 mg  50 mg Oral QHS Thurnell Lose, MD   50 mg at 10/05/22 2155   sodium chloride 0.9 % bolus 500 mL  500 mL Intravenous Once Opyd, Ilene Qua, MD       sodium chloride flush (NS) 0.9 % injection 10-40 mL  10-40 mL Intracatheter Q12H Thurnell Lose, MD       sodium chloride flush (NS) 0.9 % injection 10-40 mL  10-40 mL Intracatheter PRN Thurnell Lose, MD       traMADol Veatrice Bourbon) tablet 50 mg  50 mg Oral Q12H PRN Thurnell Lose, MD   50 mg at 10/06/22 0023     Discharge Medications: Please see discharge summary for a list of discharge medications.  Relevant Imaging Results:  Relevant Lab Results:   Additional Information SSN [id number]  Jacob Mcgrath, LCSWA
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
## 2022-10-06 NOTE — Progress Notes (Signed)
Peripherally Inserted Central Catheter Placement  The IV Nurse has discussed with the patient and/or persons authorized to consent for the patient, the purpose of this procedure and the potential benefits and risks involved with this procedure.  The benefits include less needle sticks, lab draws from the catheter, and the patient may be discharged home with the catheter. Risks include, but not limited to, infection, bleeding, blood clot (thrombus formation), and puncture of an artery; nerve damage and irregular heartbeat and possibility to perform a PICC exchange if needed/ordered by physician.  Alternatives to this procedure were also discussed.  Bard Power PICC patient education guide, fact sheet on infection prevention and patient information card has been provided to patient /or left at bedside. Consent obtained with Heloise Beecham legal guardian     PICC Placement Documentation  PICC Double Lumen 10/06/22 Right Brachial 38 cm 0 cm (Active)  Indication for Insertion or Continuance of Line Poor Vasculature-patient has had multiple peripheral attempts or PIVs lasting less than 24 hours 10/06/22 1600  Exposed Catheter (cm) 0 cm 10/06/22 1600  Site Assessment Clean, Dry, Intact 10/06/22 1600  Lumen #1 Status Flushed;Saline locked;Blood return noted 10/06/22 1600  Lumen #2 Status Flushed;Saline locked;Blood return noted 10/06/22 1600  Dressing Type Transparent;Securing device 10/06/22 1600  Dressing Status Antimicrobial disc in place;Clean, Dry, Intact 10/06/22 1600  Safety Lock Not Applicable [id number]  Line Care Connections checked and tightened 10/06/22 1600  Line Adjustment (NICU/IV Team Only) No 10/06/22 1600  Dressing Intervention New dressing 10/06/22 1600  Dressing Change Due 10/13/22 10/06/22 1600       Holley Bouche Renee 10/06/2022, 4:01 PM

## 2022-10-06 NOTE — Progress Notes (Signed)
BP found to be 70/56 with MAP 62. Confirmed with manual BP cuff. Patient awake and alert with no complaints. Notified Dr. Candiss Norse; orders to give LR 528m/hr for 2 hours, total of 1 liter of fluids.

## 2022-10-06 NOTE — Progress Notes (Signed)
  Progress Note    10/06/2022 10:22 AM 2 Days Post-Op  Subjective:  pain in left foot still present   Vitals:   10/06/22 0600 10/06/22 0759  BP:  (!) 154/79  Pulse:  (!) 102  Resp:  19  Temp: 97.8 F (36.6 C) 97.7 F (36.5 C)  SpO2: 100% 100%   Physical Exam: Cardiac:  regular Lungs:  non labored Extremities:  right groin without swelling or hematoma. 2+ left femoral pulse. Left foot well perfused and warm. Wounds Dressed Neurologic: alert and oriented  CBC    Component Value Date/Time   WBC 17.1 (H) 10/06/2022 0214   RBC 3.37 (L) 10/06/2022 0214   HGB 9.7 (L) 10/06/2022 0214   HCT 30.5 (L) 10/06/2022 0214   PLT 384 10/06/2022 0214   MCV 90.5 10/06/2022 0214   MCH 28.8 10/06/2022 0214   MCHC 31.8 10/06/2022 0214   RDW 13.1 10/06/2022 0214   LYMPHSABS 4.0 10/06/2022 0214   MONOABS 2.6 (H) 10/06/2022 0214   EOSABS 0.5 10/06/2022 0214   BASOSABS 0.1 10/06/2022 0214    BMET    Component Value Date/Time   NA 140 10/06/2022 0214   K 4.5 10/06/2022 0214   CL 105 10/06/2022 0214   CO2 21 (L) 10/06/2022 0214   GLUCOSE 138 (H) 10/06/2022 0214   BUN 31 (H) 10/06/2022 0214   CREATININE 1.02 10/06/2022 0214   CALCIUM 9.2 10/06/2022 0214   GFRNONAA >60 10/06/2022 0214    INR No results found for: "INR"   Intake/Output Summary (Last 24 hours) at 10/06/2022 1022 Last data filed at 10/06/2022 0600 Gross per 24 hour  Intake 200.35 ml  Output 1100 ml  Net -899.65 ml     Assessment/Plan:  67 y.o. male is s/p Angiogram with left external iliac artery angioplasty and stenting  Successful left external iliac artery revascularization. Left leg is warm and well perfused Continue wound care to left foot Has left SFA occlusion with single vessel runoff. Per note this may be treatable endovascularly as he is not a candidate for open bypass procedure. Plan is to take him back for repeat Angiogram on Friday 3/15 with Dr. Susanne Borders, PA-C [facility] [phone] 10/06/2022 10:22 AM

## 2022-10-06 NOTE — Progress Notes (Signed)
Patient's heart rate remain ST in 120s. Patient stated he was in pain two hours prior. Patient was treated. HR continue to sustain in 120s.

## 2022-10-06 NOTE — Progress Notes (Signed)
Patient's heart rate is sustaining in 120s. EKG given Opyd MD notified  Patient no IV access. Per IV team, PIV can not be established. Ultrasound used. Not successful. Patient need PICC or orders should be changed to PO

## 2022-10-06 NOTE — Progress Notes (Addendum)
RE: Jacob Mcgrath.  Date of Birth: 1956-06-02  Date: 10/06/2022  To Whom It May Concern:  Please be advised that the above-named patient will require a short-term nursing home stay - anticipated 30 days or less for rehabilitation and strengthening. The plan is for return home.

## 2022-10-07 DIAGNOSIS — I998 Other disorder of circulatory system: Secondary | ICD-10-CM | POA: Diagnosis not present

## 2022-10-07 DIAGNOSIS — E43 Unspecified severe protein-calorie malnutrition: Secondary | ICD-10-CM | POA: Insufficient documentation

## 2022-10-07 LAB — CBC WITH DIFFERENTIAL/PLATELET
Abs Immature Granulocytes: 0.08 10*3/uL — ABNORMAL HIGH (ref 0.00–0.07)
Basophils Absolute: 0 10*3/uL (ref 0.0–0.1)
Basophils Relative: 0 %
Eosinophils Absolute: 0.8 10*3/uL — ABNORMAL HIGH (ref 0.0–0.5)
Eosinophils Relative: 5 %
HCT: 24.4 % — ABNORMAL LOW (ref 39.0–52.0)
Hemoglobin: 7.8 g/dL — ABNORMAL LOW (ref 13.0–17.0)
Immature Granulocytes: 1 %
Lymphocytes Relative: 27 %
Lymphs Abs: 4.5 10*3/uL — ABNORMAL HIGH (ref 0.7–4.0)
MCH: 29.5 pg (ref 26.0–34.0)
MCHC: 32 g/dL (ref 30.0–36.0)
MCV: 92.4 fL (ref 80.0–100.0)
Monocytes Absolute: 2.3 10*3/uL — ABNORMAL HIGH (ref 0.1–1.0)
Monocytes Relative: 14 %
Neutro Abs: 8.8 10*3/uL — ABNORMAL HIGH (ref 1.7–7.7)
Neutrophils Relative %: 53 %
Platelets: 388 10*3/uL (ref 150–400)
RBC: 2.64 MIL/uL — ABNORMAL LOW (ref 4.22–5.81)
RDW: 13.1 % (ref 11.5–15.5)
WBC: 16.5 10*3/uL — ABNORMAL HIGH (ref 4.0–10.5)
nRBC: 0 % (ref 0.0–0.2)

## 2022-10-07 LAB — BASIC METABOLIC PANEL
Anion gap: 6 (ref 5–15)
BUN: 24 mg/dL — ABNORMAL HIGH (ref 8–23)
CO2: 23 mmol/L (ref 22–32)
Calcium: 8.3 mg/dL — ABNORMAL LOW (ref 8.9–10.3)
Chloride: 109 mmol/L (ref 98–111)
Creatinine, Ser: 0.88 mg/dL (ref 0.61–1.24)
GFR, Estimated: >60 mL/min (ref >60)
Glucose, Bld: 110 mg/dL — ABNORMAL HIGH (ref 70–99)
Potassium: 4.3 mmol/L (ref 3.5–5.1)
Sodium: 138 mmol/L (ref 135–145)

## 2022-10-07 LAB — IRON AND TIBC
Iron: 58 ug/dL (ref 45–182)
Saturation Ratios: 28 % (ref 17.9–39.5)
TIBC: 209 ug/dL — ABNORMAL LOW (ref 250–450)
UIBC: 151 ug/dL

## 2022-10-07 LAB — RETICULOCYTES
Immature Retic Fract: 15.1 % (ref 2.3–15.9)
RBC.: 2.62 MIL/uL — ABNORMAL LOW (ref 4.22–5.81)
Retic Count, Absolute: 39.3 10*3/uL (ref 19.0–186.0)
Retic Ct Pct: 1.5 % (ref 0.4–3.1)

## 2022-10-07 LAB — CBC
HCT: 29.5 % — ABNORMAL LOW (ref 39.0–52.0)
Hemoglobin: 9.9 g/dL — ABNORMAL LOW (ref 13.0–17.0)
MCH: 29.1 pg (ref 26.0–34.0)
MCHC: 33.6 g/dL (ref 30.0–36.0)
MCV: 86.8 fL (ref 80.0–100.0)
Platelets: 409 10*3/uL — ABNORMAL HIGH (ref 150–400)
RBC: 3.4 MIL/uL — ABNORMAL LOW (ref 4.22–5.81)
RDW: 13.9 % (ref 11.5–15.5)
WBC: 16.2 10*3/uL — ABNORMAL HIGH (ref 4.0–10.5)
nRBC: 0 % (ref 0.0–0.2)

## 2022-10-07 LAB — VITAMIN B12: Vitamin B-12: 412 pg/mL (ref 180–914)

## 2022-10-07 LAB — FERRITIN: Ferritin: 293 ng/mL (ref 24–336)

## 2022-10-07 LAB — GLUCOSE, CAPILLARY
Glucose-Capillary: 100 mg/dL — ABNORMAL HIGH (ref 70–99)
Glucose-Capillary: 118 mg/dL — ABNORMAL HIGH (ref 70–99)
Glucose-Capillary: 126 mg/dL — ABNORMAL HIGH (ref 70–99)
Glucose-Capillary: 111 mg/dL — ABNORMAL HIGH (ref 70–99)

## 2022-10-07 LAB — PREPARE RBC (CROSSMATCH)

## 2022-10-07 LAB — PROCALCITONIN: Procalcitonin: <0.1 ng/mL

## 2022-10-07 LAB — FOLATE: Folate: 9.8 ng/mL (ref >5.9)

## 2022-10-07 LAB — C-REACTIVE PROTEIN: CRP: 5.6 mg/dL — ABNORMAL HIGH (ref <1.0)

## 2022-10-07 MED ORDER — PANTOPRAZOLE SODIUM 40 MG PO TBEC
40.0000 mg | DELAYED_RELEASE_TABLET | Freq: Two times a day (BID) | ORAL | Status: DC
  Administered 2022-10-07 – 2022-10-11 (×9): 40 mg via ORAL
  Filled 2022-10-07 (×9): qty 1

## 2022-10-07 MED ORDER — SODIUM CHLORIDE 0.9 % IV BOLUS
500.0000 mL | INTRAVENOUS | Status: AC
  Administered 2022-10-07: 500 mL via INTRAVENOUS

## 2022-10-07 MED ORDER — SODIUM CHLORIDE 0.9% IV SOLUTION: Freq: Once | INTRAVENOUS | Status: DC

## 2022-10-07 MED ORDER — MIDODRINE HCL 5 MG PO TABS
10.0000 mg | ORAL_TABLET | ORAL | Status: AC
  Administered 2022-10-07: 10 mg via ORAL
  Filled 2022-10-07: qty 2

## 2022-10-07 MED ORDER — METOPROLOL TARTRATE 12.5 MG HALF TABLET: 12.5000 mg | ORAL_TABLET | Freq: Two times a day (BID) | ORAL | Status: DC

## 2022-10-07 MED ORDER — LACTATED RINGERS IV BOLUS
1000.0000 mL | Freq: Once | INTRAVENOUS | Status: AC
  Administered 2022-10-07: 1000 mL via INTRAVENOUS

## 2022-10-07 MED ORDER — MELATONIN 5 MG PO TABS
5.0000 mg | ORAL_TABLET | Freq: Once | ORAL | Status: AC
  Administered 2022-10-07: 5 mg via ORAL
  Filled 2022-10-07: qty 1

## 2022-10-07 MED ORDER — MIDODRINE HCL 5 MG PO TABS
10.0000 mg | ORAL_TABLET | Freq: Three times a day (TID) | ORAL | Status: DC
  Administered 2022-10-07 – 2022-10-08 (×6): 10 mg via ORAL
  Filled 2022-10-07 (×6): qty 2
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
## 2022-10-07 NOTE — Progress Notes (Signed)
Cross coverage  Hypotensive overnight with systolic blood pressure less than 80.  2 IV fluid boluses NS 500 cc each and 2 doses of p.o. vasopressor, midodrine, 10 mg each ordered to be administered, to maintain MAP greater than 65 and to increase SBP to equal or greater than 90.  Physical exam is notable for mild confusion.  Lungs are clear to auscultation.  Regular rate and rhythm.  No peripheral edema.  Will continue to closely monitor and treat as indicated.  Time: 15 minutes.

## 2022-10-07 NOTE — Progress Notes (Signed)
Received a call from bedside RN regarding the patient's hemoglobin dropping to 7.8K from 9.7K.  Hx of severe PAD.  Hypotensive with SBP < 90.  1 U PRBC ordered to be transfused to maintain Hg >8.0K.    CBC will be repeated post blood transfusion.  Time: 15 minutes.

## 2022-10-07 NOTE — TOC Progression Note (Addendum)
Transition of Care (TOC) - Progression Note    Patient Details  Name: Jacob Mcgrath. MRN: [id number] Date of Birth: 02/28/1956  Transition of Care [facility]) CM/SW [place], [place] Phone Number: 10/07/2022, 1:45 PM  Clinical Narrative:     Patient has SNF bed at [facility]. Patients passr is pending. CSW submitted clinicals to [place] must for review. CSW will continue to follow and assist with patients dc planning needs.  CSW received call from Carlton with [place] who informed CSW they are no longer able to offer SNF bed for patient. CSW LVM for patients legal guardian. CSW awaiting call back to discuss SNF bed offers/SNF choice for patient.  Expected Discharge Plan: [place]    Expected Discharge Plan and Services                                               Social Determinants of Health (SDOH) Interventions SDOH Screenings   Tobacco Use: High Risk (10/05/2022)    Readmission Risk Interventions     No data to display

## 2022-10-07 NOTE — Progress Notes (Signed)
Patient is more confused tonight and hallucinating.

## 2022-10-07 NOTE — Progress Notes (Signed)
Patient is c/o abdominal pressure near the umbilical area. Bladder scan resulted 468 mL. Aileen Fass MD notified

## 2022-10-07 NOTE — Progress Notes (Addendum)
  Progress Note    10/07/2022 8:26 AM 3 Days Post-Op  Scheduled for Aortogram, Arteriogram LLE with Dr. Stanford Breed tomorrow 3/15 Discussed risks/ benefits of procedure with patient and answered his questions Please keep NPO after midnight Consent order placed  [name] [phone] 10/07/2022 8:26 AM  VASCULAR STAFF ADDENDUM: I have independently interviewed and examined the patient. I agree with the above.  Discussed with patient's power of attorney, Heloise Beecham. Plan to proceed with angiogram tomorrow to cross SFA occlusion. NPO after midnight.  [name]. Stanford Breed, MD [facility] Phone Number: [phone] 10/07/2022 3:20 PM

## 2022-10-08 ENCOUNTER — Encounter (HOSPITAL_COMMUNITY)
Admission: EM | Disposition: A | Discharge status: Skilled Nursing Facility | Payer: Self-pay | Source: Home / Self Care | Attending: Internal Medicine

## 2022-10-08 ENCOUNTER — Inpatient Hospital Stay (HOSPITAL_COMMUNITY): Payer: Medicare Other

## 2022-10-08 DIAGNOSIS — I998 Other disorder of circulatory system: Secondary | ICD-10-CM | POA: Diagnosis not present

## 2022-10-08 HISTORY — PX: ABDOMINAL AORTOGRAM W/LOWER EXTREMITY: CATH118223

## 2022-10-08 HISTORY — PX: PERIPHERAL VASCULAR INTERVENTION: CATH118257

## 2022-10-08 LAB — MRSA NEXT GEN BY PCR, NASAL: MRSA by PCR Next Gen: NOT DETECTED

## 2022-10-08 LAB — CBC WITH DIFFERENTIAL/PLATELET
Abs Immature Granulocytes: 0.06 10*3/uL (ref 0.00–0.07)
Basophils Absolute: 0 10*3/uL (ref 0.0–0.1)
Basophils Relative: 0 %
Eosinophils Absolute: 0.5 10*3/uL (ref 0.0–0.5)
Eosinophils Relative: 3 %
HCT: 29.8 % — ABNORMAL LOW (ref 39.0–52.0)
Hemoglobin: 9.8 g/dL — ABNORMAL LOW (ref 13.0–17.0)
Immature Granulocytes: 0 %
Lymphocytes Relative: 25 %
Lymphs Abs: 4 10*3/uL (ref 0.7–4.0)
MCH: 28.8 pg (ref 26.0–34.0)
MCHC: 32.9 g/dL (ref 30.0–36.0)
MCV: 87.6 fL (ref 80.0–100.0)
Monocytes Absolute: 2.3 10*3/uL — ABNORMAL HIGH (ref 0.1–1.0)
Monocytes Relative: 15 %
Neutro Abs: 9 10*3/uL — ABNORMAL HIGH (ref 1.7–7.7)
Neutrophils Relative %: 57 %
Platelets: 411 10*3/uL — ABNORMAL HIGH (ref 150–400)
RBC: 3.4 MIL/uL — ABNORMAL LOW (ref 4.22–5.81)
RDW: 13.9 % (ref 11.5–15.5)
WBC: 16 10*3/uL — ABNORMAL HIGH (ref 4.0–10.5)
nRBC: 0 % (ref 0.0–0.2)

## 2022-10-08 LAB — GLUCOSE, CAPILLARY
Glucose-Capillary: 95 mg/dL (ref 70–99)
Glucose-Capillary: 120 mg/dL — ABNORMAL HIGH (ref 70–99)
Glucose-Capillary: 121 mg/dL — ABNORMAL HIGH (ref 70–99)
Glucose-Capillary: 127 mg/dL — ABNORMAL HIGH (ref 70–99)

## 2022-10-08 LAB — COMPREHENSIVE METABOLIC PANEL
ALT: 20 U/L (ref 0–44)
AST: 25 U/L (ref 15–41)
Albumin: 2.6 g/dL — ABNORMAL LOW (ref 3.5–5.0)
Alkaline Phosphatase: 56 U/L (ref 38–126)
Anion gap: 10 (ref 5–15)
BUN: 19 mg/dL (ref 8–23)
CO2: 24 mmol/L (ref 22–32)
Calcium: 8.9 mg/dL (ref 8.9–10.3)
Chloride: 108 mmol/L (ref 98–111)
Creatinine, Ser: 1.01 mg/dL (ref 0.61–1.24)
GFR, Estimated: >60 mL/min (ref >60)
Glucose, Bld: 132 mg/dL — ABNORMAL HIGH (ref 70–99)
Potassium: 3.9 mmol/L (ref 3.5–5.1)
Sodium: 142 mmol/L (ref 135–145)
Total Bilirubin: 1 mg/dL (ref 0.3–1.2)
Total Protein: 6.4 g/dL — ABNORMAL LOW (ref 6.5–8.1)

## 2022-10-08 LAB — MAGNESIUM: Magnesium: 1.7 mg/dL (ref 1.7–2.4)

## 2022-10-08 LAB — TYPE AND SCREEN
ABO/RH(D): O POS
Antibody Screen: NEGATIVE
Unit division: 0

## 2022-10-08 LAB — BPAM RBC
Blood Product Expiration Date: 202404072359
ISSUE DATE / TIME: 202403140754
Unit Type and Rh: 5100

## 2022-10-08 LAB — BRAIN NATRIURETIC PEPTIDE: B Natriuretic Peptide: 446 pg/mL — ABNORMAL HIGH (ref 0.0–100.0)

## 2022-10-08 LAB — PROCALCITONIN: Procalcitonin: <0.1 ng/mL

## 2022-10-08 LAB — C-REACTIVE PROTEIN: CRP: 6.9 mg/dL — ABNORMAL HIGH (ref <1.0)

## 2022-10-08 SURGERY — ABDOMINAL AORTOGRAM W/LOWER EXTREMITY
Anesthesia: LOCAL

## 2022-10-08 MED ORDER — HYDRALAZINE HCL 20 MG/ML IJ SOLN: 5.0000 mg | INTRAMUSCULAR | Status: DC | PRN

## 2022-10-08 MED ORDER — ACETAMINOPHEN 325 MG PO TABS
650.0000 mg | ORAL_TABLET | ORAL | Status: DC | PRN
  Administered 2022-10-09 – 2022-10-11 (×3): 650 mg via ORAL
  Filled 2022-10-08 (×3): qty 2

## 2022-10-08 MED ORDER — LIDOCAINE HCL (PF) 1 % IJ SOLN
INTRAMUSCULAR | Status: DC | PRN
  Administered 2022-10-08: 10 mL

## 2022-10-08 MED ORDER — SODIUM CHLORIDE 0.9 % WEIGHT BASED INFUSION: 1.0000 mL/kg/h | INTRAVENOUS | Status: DC

## 2022-10-08 MED ORDER — SODIUM CHLORIDE 0.9% FLUSH
3.0000 mL | Freq: Two times a day (BID) | INTRAVENOUS | Status: DC
  Administered 2022-10-08 – 2022-10-10 (×4): 3 mL via INTRAVENOUS

## 2022-10-08 MED ORDER — HEPARIN (PORCINE) IN NACL 1000-0.9 UT/500ML-% IV SOLN
INTRAVENOUS | Status: DC | PRN
  Administered 2022-10-08 (×2): 500 mL

## 2022-10-08 MED ORDER — SODIUM CHLORIDE 0.9 % IV SOLN: INTRAVENOUS | Status: DC

## 2022-10-08 MED ORDER — METOPROLOL TARTRATE 25 MG PO TABS
25.0000 mg | ORAL_TABLET | Freq: Two times a day (BID) | ORAL | Status: DC
  Administered 2022-10-08 – 2022-10-11 (×7): 25 mg via ORAL
  Filled 2022-10-08 (×8): qty 1

## 2022-10-08 MED ORDER — LIDOCAINE HCL (PF) 1 % IJ SOLN
INTRAMUSCULAR | Status: AC
  Filled 2022-10-08: qty 30

## 2022-10-08 MED ORDER — HEPARIN SODIUM (PORCINE) 5000 UNIT/ML IJ SOLN
5000.0000 [IU] | Freq: Three times a day (TID) | INTRAMUSCULAR | Status: DC
  Administered 2022-10-09 – 2022-10-11 (×8): 5000 [IU] via SUBCUTANEOUS
  Filled 2022-10-08 (×8): qty 1

## 2022-10-08 MED ORDER — ONDANSETRON HCL 4 MG/2ML IJ SOLN: 4.0000 mg | Freq: Four times a day (QID) | INTRAMUSCULAR | Status: DC | PRN

## 2022-10-08 MED ORDER — SODIUM CHLORIDE 0.9 % IV SOLN: 250.0000 mL | INTRAVENOUS | Status: DC | PRN

## 2022-10-08 MED ORDER — HEPARIN SODIUM (PORCINE) 1000 UNIT/ML IJ SOLN
INTRAMUSCULAR | Status: DC | PRN
  Administered 2022-10-08: 6000 [IU] via INTRAVENOUS

## 2022-10-08 MED ORDER — IODIXANOL 320 MG/ML IV SOLN
INTRAVENOUS | Status: DC | PRN
  Administered 2022-10-08: 20 mL

## 2022-10-08 MED ORDER — LABETALOL HCL 5 MG/ML IV SOLN: 10.0000 mg | INTRAVENOUS | Status: DC | PRN

## 2022-10-08 MED ORDER — HEPARIN SODIUM (PORCINE) 1000 UNIT/ML IJ SOLN
INTRAMUSCULAR | Status: AC
  Filled 2022-10-08: qty 10

## 2022-10-08 MED ORDER — SODIUM CHLORIDE 0.9% FLUSH: 3.0000 mL | INTRAVENOUS | Status: DC | PRN

## 2022-10-08 SURGICAL SUPPLY — 18 items
BALLN MUSTANG 5.0X40 135 (BALLOONS) ×2
BALLN MUSTANG 5X120X135 (BALLOONS) ×2
BALLOON MUSTANG 5.0X40 135 (BALLOONS) IMPLANT
BALLOON MUSTANG 5X120X135 (BALLOONS) IMPLANT
CATH OMNI FLUSH 5F 65CM (CATHETERS) IMPLANT
CATH QUICKCROSS SUPP .035X90CM (MICROCATHETER) IMPLANT
CLOSURE PERCLOSE PROSTYLE (VASCULAR PRODUCTS) IMPLANT
GLIDEWIRE ADV .035X260CM (WIRE) IMPLANT
KIT ENCORE 26 ADVANTAGE (KITS) IMPLANT
KIT MICROPUNCTURE NIT STIFF (SHEATH) IMPLANT
KIT PV (KITS) ×2 IMPLANT
SHEATH CATAPULT 6FR 45 (SHEATH) IMPLANT
SHEATH PINNACLE 5F 10CM (SHEATH) IMPLANT
SHEATH PROBE COVER 6X72 (BAG) IMPLANT
STENT ELUVIA 6X120X130 (Permanent Stent) IMPLANT
STENT ELUVIA 6X40X130 (Permanent Stent) IMPLANT
TRANSDUCER W/STOPCOCK (MISCELLANEOUS) ×2 IMPLANT
TRAY PV CATH (CUSTOM PROCEDURE TRAY) ×2 IMPLANT

## 2022-10-08 NOTE — Op Note (Signed)
DATE OF SERVICE: 10/08/2022  PATIENT:  Jacob Mcgrath.  67 y.o. male  PRE-OPERATIVE DIAGNOSIS:  Atherosclerosis of native arteries of left lower extremity causing ulceration  POST-OPERATIVE DIAGNOSIS:  Same  PROCEDURE:   1) Ultrasound guided right common femoral artery access 2) Left lower extremity angiogram with third order cannulation (67mL total contrast) 3) Left femoropopliteal angioplasty and stenting (6x55mm Eluvia x 2; 6x173mm Eluvia)  SURGEON:  [name]. Stanford Breed, MD  ASSISTANT: none  ANESTHESIA:   local  ESTIMATED BLOOD LOSS: minimal  LOCAL MEDICATIONS USED:  LIDOCAINE   COUNTS: confirmed correct.  PATIENT DISPOSITION:  PACU - hemodynamically stable.   Delay start of Pharmacological VTE agent (>24hrs) due to surgical blood loss or risk of bleeding: no  INDICATION FOR PROCEDURE: Jacob Mcgrath. is a 67 y.o. male with left leg ulceration and known SFA occlusion. After careful discussion of risks, benefits, and alternatives the patient was offered angiography.  The patient's decision maker understood and wished to proceed.  OPERATIVE FINDINGS:   Left lower extremity: Common femoral artery: patent  Profunda femoris artery: stenosis at origin  Superficial femoral artery: stenosis at origin. Occlusion in distal thigh Popliteal artery: reconstitution above the knee Anterior tibial artery: seen filling normally from below knee popliteal Tibioperoneal trunk: not studied Peroneal artery: not studied Posterior tibial artery: not studied Pedal circulation: not studied  DESCRIPTION OF PROCEDURE: After identification of the patient in the pre-operative holding area, the patient was transferred to the operating room. The patient was positioned supine on the operating room table.  Anesthesia was induced. The groins was prepped and draped in standard fashion. A surgical pause was performed confirming correct patient, procedure, and operative location.  The right groin was  anesthetized with subcutaneous injection of 1% lidocaine. Using ultrasound guidance, the right common femoral artery was accessed with micropuncture technique. Fluoroscopy was used to confirm cannulation over the femoral head. The 37F sheath was upsized to 30F.   The left common iliac artery was selected with an omniflush catheter and glidewire advantage guidewire. The wire was advanced into the common femoral artery. Over the wire the omni flush catheter was advanced into the external iliac artery. Selective angiography was performed - see above for details.   The decision was made to intervene. The patient was heparinized with 7000 units of heparin. The 30F sheath was exchanged for a 25F x 45cm sheath. Selective angiography of the left lower extremity was performed prior to intervention.   The lesions were treated with: Left femoropopliteal angioplasty and stenting (6x58mm Eluvia x 2; 6x137mm Eluvia)  Completion angiography revealed:  Resolution of SFA occlusion and proximal stenosis  A perclose device was used to close the arteriotomy. Hemostasis was excellent upon completion.  Upon completion of the case instrument and sharps counts were confirmed correct. The patient was transferred to the PACU in good condition. I was present for all portions of the procedure.  PLAN: ASA 81mg  PO QD. Plavix 75mg  PO QD. High intensity statin therapy. Optimized from a vascular standpoint.  [name]. Stanford Breed, MD [facility] Phone Number: [phone] 10/08/2022 8:41 AM

## 2022-10-08 NOTE — TOC Progression Note (Addendum)
Transition of Care (TOC) - Progression Note    Patient Details  Name: Saheim Geary. MRN: [id number] Date of Birth: 04-18-1956  Transition of Care [facility]) CM/SW [place], [place] Phone Number: 10/08/2022, 10:45 AM  Clinical Narrative:     CSW spoke with Nevin Bloodgood patients legal guardian and provided SNF bed offers. Nevin Bloodgood chose SNF bed for patient at [facility]. CSW spoke with Whitney with central intake who confirmed SNF bed for patient. CSW will continue to follow and assist with patients dc planning needs.  CSW received passr for patient. CSW added to patients FL2.   Expected Discharge Plan: [place]    Expected Discharge Plan and Services                                               Social Determinants of Health (SDOH) Interventions SDOH Screenings   Tobacco Use: High Risk (10/05/2022)    Readmission Risk Interventions     No data to display

## 2022-10-08 NOTE — Progress Notes (Signed)
Notified Dr. Candiss Norse of continual hypotension and tachycardia, SBP ranging from 80-100s and heart rates from 110-130s. Automatic and manual blood pressures checked. Patient asymptomatic. Orders to give the metoprolol PO now and change hold parameters.
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
## 2022-10-08 NOTE — Progress Notes (Signed)
Notified Dr. Candiss Norse of bladder scan 484cc, orders for indwelling foley d/t multiple retention in and out caths.

## 2022-10-08 NOTE — Progress Notes (Signed)
[facility] [place] PROGRESS NOTE  ASSESSMENT / PLAN: Jacob Mcgrath. is a 67 y.o. male with left lower extremity chronic limb threatening ischemia with ulceration. He has known L SFA occlusion. Plan endovascular intervention today in cath lab.  I discussed this with his legal guardian who is amenable to proceeding.   SUBJECTIVE: No interval change. Confused, but cooperative.  OBJECTIVE: BP 96/73 (BP Location: Left Arm)   Pulse (!) 120   Temp 98.4 F (36.9 C) (Axillary)   Resp 18   Ht 5\' 9"  (1.753 m)   Wt 58 kg   SpO2 96%   BMI 18.87 kg/m   Intake/Output Summary (Last 24 hours) at 10/08/2022 0754 Last data filed at 10/08/2022 0700 Gross per 24 hour  Intake 684 ml  Output 4052 ml  Net -3368 ml    No acute distress Regular rate and rhythm Unlabored breathing No pulses in L foot Ischemic ulceration unchanged     Latest Ref Rng & Units 10/08/2022    4:33 AM 10/07/2022   12:40 PM 10/07/2022    3:25 AM  CBC  WBC 4.0 - 10.5 K/uL 16.0  16.2  16.5   Hemoglobin 13.0 - 17.0 g/dL 9.8  9.9  7.8   Hematocrit 39.0 - 52.0 % 29.8  29.5  24.4   Platelets 150 - 400 K/uL 411  409  388         Latest Ref Rng & Units 10/08/2022    4:33 AM 10/07/2022    3:25 AM 10/06/2022    2:14 AM  CMP  Glucose 70 - 99 mg/dL 132  110  138   BUN 8 - 23 mg/dL 19  24  31    Creatinine 0.61 - 1.24 mg/dL 1.01  0.88  1.02   Sodium 135 - 145 mmol/L 142  138  140   Potassium 3.5 - 5.1 mmol/L 3.9  4.3  4.5   Chloride 98 - 111 mmol/L 108  109  105   CO2 22 - 32 mmol/L 24  23  21    Calcium 8.9 - 10.3 mg/dL 8.9  8.3  9.2   Total Protein 6.5 - 8.1 g/dL 6.4     Total Bilirubin 0.3 - 1.2 mg/dL 1.0     Alkaline Phos 38 - 126 U/L 56     AST 15 - 41 U/L 25     ALT 0 - 44 U/L 20       Estimated Creatinine Clearance: 59 mL/min (by C-G formula based on SCr of 1.01 mg/dL).  [name]. Stanford Breed, MD [facility] Phone Number: [phone] 10/08/2022 7:54 AM

## 2022-10-08 NOTE — Progress Notes (Signed)
PT Cancellation Note  Patient Details Name: Jacob Mcgrath. MRN: [id number] DOB: 11-14-1955   Cancelled Treatment:    Reason Eval/Treat Not Completed: Patient at procedure or test/unavailable   Shary Decamp [facility] 10/08/2022, 8:38 AM [place] Office [phone]

## 2022-10-09 LAB — CBC WITH DIFFERENTIAL/PLATELET
Abs Immature Granulocytes: 0.07 10*3/uL (ref 0.00–0.07)
Basophils Absolute: 0.1 10*3/uL (ref 0.0–0.1)
Basophils Relative: 0 %
Eosinophils Absolute: 0.7 10*3/uL — ABNORMAL HIGH (ref 0.0–0.5)
Eosinophils Relative: 4 %
HCT: 27.4 % — ABNORMAL LOW (ref 39.0–52.0)
Hemoglobin: 9.1 g/dL — ABNORMAL LOW (ref 13.0–17.0)
Immature Granulocytes: 0 %
Lymphocytes Relative: 31 %
Lymphs Abs: 5 10*3/uL — ABNORMAL HIGH (ref 0.7–4.0)
MCH: 29.3 pg (ref 26.0–34.0)
MCHC: 33.2 g/dL (ref 30.0–36.0)
MCV: 88.1 fL (ref 80.0–100.0)
Monocytes Absolute: 2.6 10*3/uL — ABNORMAL HIGH (ref 0.1–1.0)
Monocytes Relative: 16 %
Neutro Abs: 7.9 10*3/uL — ABNORMAL HIGH (ref 1.7–7.7)
Neutrophils Relative %: 49 %
Platelets: 379 10*3/uL (ref 150–400)
RBC: 3.11 MIL/uL — ABNORMAL LOW (ref 4.22–5.81)
RDW: 14.1 % (ref 11.5–15.5)
WBC: 16.3 10*3/uL — ABNORMAL HIGH (ref 4.0–10.5)
nRBC: 0 % (ref 0.0–0.2)

## 2022-10-09 LAB — COMPREHENSIVE METABOLIC PANEL
ALT: 18 U/L (ref 0–44)
AST: 21 U/L (ref 15–41)
Albumin: 2.5 g/dL — ABNORMAL LOW (ref 3.5–5.0)
Alkaline Phosphatase: 53 U/L (ref 38–126)
Anion gap: 9 (ref 5–15)
BUN: 14 mg/dL (ref 8–23)
CO2: 26 mmol/L (ref 22–32)
Calcium: 8.7 mg/dL — ABNORMAL LOW (ref 8.9–10.3)
Chloride: 104 mmol/L (ref 98–111)
Creatinine, Ser: 0.88 mg/dL (ref 0.61–1.24)
GFR, Estimated: >60 mL/min (ref >60)
Glucose, Bld: 99 mg/dL (ref 70–99)
Potassium: 3.8 mmol/L (ref 3.5–5.1)
Sodium: 139 mmol/L (ref 135–145)
Total Bilirubin: 0.8 mg/dL (ref 0.3–1.2)
Total Protein: 6.2 g/dL — ABNORMAL LOW (ref 6.5–8.1)

## 2022-10-09 LAB — GLUCOSE, CAPILLARY
Glucose-Capillary: 83 mg/dL (ref 70–99)
Glucose-Capillary: 131 mg/dL — ABNORMAL HIGH (ref 70–99)
Glucose-Capillary: 141 mg/dL — ABNORMAL HIGH (ref 70–99)
Glucose-Capillary: 141 mg/dL — ABNORMAL HIGH (ref 70–99)

## 2022-10-09 LAB — PROCALCITONIN: Procalcitonin: <0.1 ng/mL

## 2022-10-09 LAB — BRAIN NATRIURETIC PEPTIDE: B Natriuretic Peptide: 214.5 pg/mL — ABNORMAL HIGH (ref 0.0–100.0)

## 2022-10-09 LAB — MAGNESIUM: Magnesium: 1.5 mg/dL — ABNORMAL LOW (ref 1.7–2.4)

## 2022-10-09 LAB — C-REACTIVE PROTEIN: CRP: 8.2 mg/dL — ABNORMAL HIGH (ref <1.0)

## 2022-10-09 MED ORDER — MIDODRINE HCL 5 MG PO TABS
5.0000 mg | ORAL_TABLET | Freq: Two times a day (BID) | ORAL | Status: DC
  Administered 2022-10-09 – 2022-10-11 (×6): 5 mg via ORAL
  Filled 2022-10-09 (×6): qty 1

## 2022-10-09 MED ORDER — METOPROLOL TARTRATE 5 MG/5ML IV SOLN: 5.0000 mg | Freq: Three times a day (TID) | INTRAVENOUS | Status: DC | PRN

## 2022-10-09 MED ORDER — CEPHALEXIN 500 MG PO CAPS
500.0000 mg | ORAL_CAPSULE | Freq: Three times a day (TID) | ORAL | Status: DC
  Administered 2022-10-09 – 2022-10-11 (×6): 500 mg via ORAL
  Filled 2022-10-09 (×6): qty 1

## 2022-10-09 MED ORDER — MAGNESIUM SULFATE 2 GM/50ML IV SOLN
2.0000 g | Freq: Once | INTRAVENOUS | Status: AC
  Administered 2022-10-09: 2 g via INTRAVENOUS
  Filled 2022-10-09: qty 50

## 2022-10-09 MED ORDER — MAGNESIUM SULFATE 4 GM/100ML IV SOLN: 4.0000 g | Freq: Once | INTRAVENOUS | Status: DC

## 2022-10-09 MED ORDER — MIDODRINE HCL 5 MG PO TABS: 5.0000 mg | ORAL_TABLET | Freq: Three times a day (TID) | ORAL | Status: DC

## 2022-10-09 MED ORDER — METRONIDAZOLE 500 MG/100ML IV SOLN
500.0000 mg | Freq: Two times a day (BID) | INTRAVENOUS | Status: DC
  Administered 2022-10-09 – 2022-10-10 (×2): 500 mg via INTRAVENOUS
  Filled 2022-10-09 (×2): qty 100

## 2022-10-09 MED ORDER — HYDRALAZINE HCL 20 MG/ML IJ SOLN: 10.0000 mg | Freq: Four times a day (QID) | INTRAMUSCULAR | Status: DC | PRN

## 2022-10-09 MED ORDER — HYDROCODONE-ACETAMINOPHEN 5-325 MG PO TABS
1.0000 | ORAL_TABLET | Freq: Three times a day (TID) | ORAL | Status: DC | PRN
  Administered 2022-10-09 – 2022-10-11 (×4): 1 via ORAL
  Filled 2022-10-09 (×4): qty 1

## 2022-10-09 MED ORDER — TAMSULOSIN HCL 0.4 MG PO CAPS
0.4000 mg | ORAL_CAPSULE | Freq: Every day | ORAL | Status: DC
  Administered 2022-10-09 – 2022-10-11 (×3): 0.4 mg via ORAL
  Filled 2022-10-09 (×3): qty 1
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
## 2022-10-09 NOTE — Progress Notes (Signed)
  Progress Note    10/09/2022 8:01 AM 1 Day Post-Op  Subjective: No overnight issues  Vitals:   10/08/22 2334 10/09/22 0307  BP: 108/61 (!) 143/67  Pulse: 84 69  Resp: 20 13  Temp: 98.2 F (36.8 C) 98 F (36.7 C)  SpO2: 97% 99%    Physical Exam: Awake alert and oriented Right groin cannulation site is soft without hematoma Dressing in place on left foot left lower extremity is warm and appears well-perfused  CBC    Component Value Date/Time   WBC 16.3 (H) 10/09/2022 0444   RBC 3.11 (L) 10/09/2022 0444   HGB 9.1 (L) 10/09/2022 0444   HCT 27.4 (L) 10/09/2022 0444   PLT 379 10/09/2022 0444   MCV 88.1 10/09/2022 0444   MCH 29.3 10/09/2022 0444   MCHC 33.2 10/09/2022 0444   RDW 14.1 10/09/2022 0444   LYMPHSABS 5.0 (H) 10/09/2022 0444   MONOABS 2.6 (H) 10/09/2022 0444   EOSABS 0.7 (H) 10/09/2022 0444   BASOSABS 0.1 10/09/2022 0444    BMET    Component Value Date/Time   NA 139 10/09/2022 0444   K 3.8 10/09/2022 0444   CL 104 10/09/2022 0444   CO2 26 10/09/2022 0444   GLUCOSE 99 10/09/2022 0444   BUN 14 10/09/2022 0444   CREATININE 0.88 10/09/2022 0444   CALCIUM 8.7 (L) 10/09/2022 0444   GFRNONAA >60 10/09/2022 0444    INR No results found for: "INR"   Intake/Output Summary (Last 24 hours) at 10/09/2022 0801 Last data filed at 10/09/2022 0531 Gross per 24 hour  Intake 1177 ml  Output 2050 ml  Net -873 ml     Assessment:  67 y.o. male is s/p left lower extremity endovascular revascularization with initial stenting of the left external iliac artery last Monday and then yesterday stenting of the left SFA with anterior tibial artery runoff 1 Day Post-Op  Plan: Optimized from vascular standpoint and will need to be on aspirin, Plavix and high intensity statin.  Plan is for outpatient follow-up with Dr. Stanford Breed with left lower extremity arterial duplex and ABIs for wound check in approximately 2 to 4 weeks   Kindred Reidinger C. Donzetta Matters, MD [facility] [place] Office: [phone] Pager: [phone]  10/09/2022 8:01 AM

## 2022-10-10 LAB — CBC WITH DIFFERENTIAL/PLATELET
Abs Immature Granulocytes: 0.04 10*3/uL (ref 0.00–0.07)
Basophils Absolute: 0 10*3/uL (ref 0.0–0.1)
Basophils Relative: 0 %
Eosinophils Absolute: 0.8 10*3/uL — ABNORMAL HIGH (ref 0.0–0.5)
Eosinophils Relative: 6 %
HCT: 25.8 % — ABNORMAL LOW (ref 39.0–52.0)
Hemoglobin: 8.6 g/dL — ABNORMAL LOW (ref 13.0–17.0)
Immature Granulocytes: 0 %
Lymphocytes Relative: 27 %
Lymphs Abs: 3.4 10*3/uL (ref 0.7–4.0)
MCH: 29.7 pg (ref 26.0–34.0)
MCHC: 33.3 g/dL (ref 30.0–36.0)
MCV: 89 fL (ref 80.0–100.0)
Monocytes Absolute: 2 10*3/uL — ABNORMAL HIGH (ref 0.1–1.0)
Monocytes Relative: 16 %
Neutro Abs: 6.5 10*3/uL (ref 1.7–7.7)
Neutrophils Relative %: 51 %
Platelets: 344 10*3/uL (ref 150–400)
RBC: 2.9 MIL/uL — ABNORMAL LOW (ref 4.22–5.81)
RDW: 14.1 % (ref 11.5–15.5)
WBC: 12.7 10*3/uL — ABNORMAL HIGH (ref 4.0–10.5)
nRBC: 0 % (ref 0.0–0.2)

## 2022-10-10 LAB — COMPREHENSIVE METABOLIC PANEL
ALT: 18 U/L (ref 0–44)
AST: 20 U/L (ref 15–41)
Albumin: 2.3 g/dL — ABNORMAL LOW (ref 3.5–5.0)
Alkaline Phosphatase: 48 U/L (ref 38–126)
Anion gap: 7 (ref 5–15)
BUN: 12 mg/dL (ref 8–23)
CO2: 26 mmol/L (ref 22–32)
Calcium: 8.7 mg/dL — ABNORMAL LOW (ref 8.9–10.3)
Chloride: 105 mmol/L (ref 98–111)
Creatinine, Ser: 0.91 mg/dL (ref 0.61–1.24)
GFR, Estimated: >60 mL/min (ref >60)
Glucose, Bld: 137 mg/dL — ABNORMAL HIGH (ref 70–99)
Potassium: 3.8 mmol/L (ref 3.5–5.1)
Sodium: 138 mmol/L (ref 135–145)
Total Bilirubin: 0.4 mg/dL (ref 0.3–1.2)
Total Protein: 5.9 g/dL — ABNORMAL LOW (ref 6.5–8.1)

## 2022-10-10 LAB — C-REACTIVE PROTEIN: CRP: 8.1 mg/dL — ABNORMAL HIGH (ref <1.0)

## 2022-10-10 LAB — GLUCOSE, CAPILLARY
Glucose-Capillary: 115 mg/dL — ABNORMAL HIGH (ref 70–99)
Glucose-Capillary: 135 mg/dL — ABNORMAL HIGH (ref 70–99)
Glucose-Capillary: 134 mg/dL — ABNORMAL HIGH (ref 70–99)
Glucose-Capillary: 166 mg/dL — ABNORMAL HIGH (ref 70–99)

## 2022-10-10 LAB — BRAIN NATRIURETIC PEPTIDE: B Natriuretic Peptide: 183.6 pg/mL — ABNORMAL HIGH (ref 0.0–100.0)

## 2022-10-10 LAB — MAGNESIUM: Magnesium: 1.8 mg/dL (ref 1.7–2.4)

## 2022-10-10 LAB — PROCALCITONIN: Procalcitonin: <0.1 ng/mL

## 2022-10-10 MED ORDER — METRONIDAZOLE 500 MG PO TABS: 500.0000 mg | ORAL_TABLET | Freq: Three times a day (TID) | ORAL | Status: DC

## 2022-10-10 MED ORDER — METRONIDAZOLE 500 MG PO TABS
500.0000 mg | ORAL_TABLET | Freq: Two times a day (BID) | ORAL | Status: DC
  Administered 2022-10-10 – 2022-10-11 (×2): 500 mg via ORAL
  Filled 2022-10-10 (×2): qty 1
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
## 2022-10-11 ENCOUNTER — Encounter (HOSPITAL_COMMUNITY): Payer: Self-pay | Admitting: Vascular Surgery

## 2022-10-11 LAB — GLUCOSE, CAPILLARY
Glucose-Capillary: 110 mg/dL — ABNORMAL HIGH (ref 70–99)
Glucose-Capillary: 104 mg/dL — ABNORMAL HIGH (ref 70–99)
Glucose-Capillary: 151 mg/dL — ABNORMAL HIGH (ref 70–99)
Glucose-Capillary: 138 mg/dL — ABNORMAL HIGH (ref 70–99)

## 2022-10-11 MED ORDER — CLOPIDOGREL BISULFATE 75 MG PO TABS: 75.0000 mg | ORAL_TABLET | Freq: Every day | ORAL | Status: DC

## 2022-10-11 MED ORDER — CEPHALEXIN 500 MG PO CAPS: 500.0000 mg | ORAL_CAPSULE | Freq: Three times a day (TID) | ORAL | Status: DC

## 2022-10-11 MED ORDER — PANTOPRAZOLE SODIUM 40 MG PO TBEC: 40.0000 mg | DELAYED_RELEASE_TABLET | Freq: Every day | ORAL | Status: DC

## 2022-10-11 MED ORDER — METRONIDAZOLE 500 MG PO TABS: 500.0000 mg | ORAL_TABLET | Freq: Two times a day (BID) | ORAL | Status: DC

## 2022-10-11 MED ORDER — METOPROLOL TARTRATE 25 MG PO TABS: 25.0000 mg | ORAL_TABLET | Freq: Two times a day (BID) | ORAL | Status: DC

## 2022-10-11 MED ORDER — TAMSULOSIN HCL 0.4 MG PO CAPS: 0.4000 mg | ORAL_CAPSULE | Freq: Every day | ORAL | Status: DC

## 2022-10-11 MED ORDER — MIDODRINE HCL 5 MG PO TABS: 5.0000 mg | ORAL_TABLET | Freq: Two times a day (BID) | ORAL | Status: DC

## 2022-10-11 MED ORDER — HYDROCODONE-ACETAMINOPHEN 5-325 MG PO TABS: 1.0000 | ORAL_TABLET | Freq: Three times a day (TID) | ORAL | 0 refills | Status: DC | PRN

## 2022-10-11 NOTE — Progress Notes (Signed)
Physical Therapy Treatment Patient Details Name: Jacob Mcgrath. MRN: [id number] DOB: May 25, 1956 Today's Date: 10/11/2022   History of Present Illness Pt is a 67 y/o male who presents 3/8 with nonhealing wounds on the LLE. PMH significant for DM, HTN.  Patient s/p Angioplasty and stenting of the left external iliac artery 3/11.    PT Comments    Pt was seen for mobility to attempt to stand and to move LE's with stretches and initial strengthening.  Pt is initially motivated to move but once attempting to stand is fearful and limits exertion to power up legs.  Pt is anticipating a discharge to SNF today, and his family is in attendance to make this progression.  Follow along with him to get the greatest level of mobilization including standing and making steps as tolerated toward progressing home.  May be appropriate for ALF setting after SNF is done.     Recommendations for follow up therapy are one component of a multi-disciplinary discharge planning process, led by the attending physician.  Recommendations may be updated based on patient status, additional functional criteria and insurance authorization.  Follow Up Recommendations  Skilled nursing-short term rehab (<3 hours/day) Can patient physically be transported by private vehicle: No   Assistance Recommended at Discharge Frequent or constant Supervision/Assistance  Patient can return home with the following Two people to help with walking and/or transfers;Assist for transportation;A lot of help with bathing/dressing/bathroom;Assistance with cooking/housework;Help with stairs or ramp for entrance   Equipment Recommendations  Wheelchair (measurements PT);Wheelchair cushion (measurements PT)    Recommendations for Other Services       Precautions / Restrictions Precautions Precautions: Fall Precaution Comments: Pain LLE prohibiting standing Restrictions Weight Bearing Restrictions: No RLE Weight Bearing: Weight bearing as  tolerated LLE Weight Bearing: Weight bearing as tolerated Other Position/Activity Restrictions: L leg and arm contractures     Mobility  Bed Mobility Overal bed mobility: Needs Assistance             General bed mobility comments: up in chair when PT arrives    Transfers Overall transfer level: Needs assistance Equipment used: Rolling walker (2 wheels), 1 person hand held assist Transfers: Sit to/from Stand Sit to Stand: Max assist           General transfer comment: tried both direct assist of PT and walker, which pt actually did not assist with and instead refused to stand, refused to power up with legs    Ambulation/Gait               General Gait Details: declines to stand up with PT   Stairs             Wheelchair Mobility    Modified Rankin (Stroke Patients Only)       Balance Overall balance assessment: Needs assistance Sitting-balance support: Feet supported Sitting balance-Leahy Scale: Fair       Standing balance-Leahy Scale: Zero                              Cognition Arousal/Alertness: Awake/alert Behavior During Therapy: WFL for tasks assessed/performed Overall Cognitive Status: History of cognitive impairments - at baseline                                 General Comments: dementia baseline        Exercises General Exercises - Lower Extremity  Ankle Circles/Pumps: AAROM, 5 reps Long Arc Quad: AAROM, 10 reps Heel Slides: AAROM, 10 reps Hip ABduction/ADduction: AAROM, 10 reps    General Comments General comments (skin integrity, edema, etc.): Pt is up in chair but refusing to let PT support him on L side or RUE and try to stand nor will he let PT support around trunk to stand      Pertinent Vitals/Pain Pain Assessment Pain Assessment: Faces Faces Pain Scale: Hurts even more Pain Location: lt foot Pain Descriptors / Indicators: Grimacing, Guarding Pain Intervention(s): Limited activity  within patient's tolerance, Monitored during session, Premedicated before session, Repositioned    Home Living                          Prior Function            PT Goals (current goals can now be found in the care plan section) Acute Rehab PT Goals Patient Stated Goal: None stated PT Goal Formulation: Patient unable to participate in goal setting Progress towards PT goals: Not progressing toward goals - comment    Frequency    Min 1X/week      PT Plan Frequency needs to be updated    Co-evaluation              AM-PAC PT "6 Clicks" Mobility   Outcome Measure  Help needed turning from your back to your side while in a flat bed without using bedrails?: A Little Help needed moving from lying on your back to sitting on the side of a flat bed without using bedrails?: A Lot Help needed moving to and from a bed to a chair (including a wheelchair)?: Total Help needed standing up from a chair using your arms (e.g., wheelchair or bedside chair)?: Total Help needed to walk in hospital room?: Total Help needed climbing 3-5 steps with a railing? : Total 6 Click Score: 9    End of Session Equipment Utilized During Treatment: Gait belt Activity Tolerance: Patient limited by fatigue;Patient limited by pain Patient left: in chair;with call bell/phone within reach;with chair alarm set Nurse Communication: Mobility status PT Visit Diagnosis: Unsteadiness on feet (R26.81);Pain Pain - Right/Left: Left Pain - part of body: Ankle and joints of foot     Time: 1321-1344 PT Time Calculation (min) (ACUTE ONLY): 23 min  Charges:  $Therapeutic Exercise: 8-22 mins $Therapeutic Activity: 8-22 mins               Ramond Dial 10/11/2022, 3:42 PM  Mee Hives, PT PhD Acute Rehab Dept. Number: [place] [facility] [place]

## 2022-10-11 NOTE — Discharge Summary (Signed)
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 10/08/22 0434 10/09/22 0444 10/09/22 0445 10/10/22 0423  NA 139  --  138  --   --  140  --   --  138 142  --  139  --  138  K 3.8  --  4.3  --   --  4.5  --   --  4.3 3.9  --  3.8  --  3.8  CL 108  --  105  --   --  105  --   --  109 108  --  104  --  105  CO2 21*  --  22  --   --  21*  --   --  23 24  --  26  --  26  ANIONGAP 10  --  11  --   --  14  --   --  6 10  --  9  --  7  GLUCOSE 115*  --  155*  --   --  138*  --   --  110* 132*  --  99  --  137*  BUN 17  --  25*  --   --  31*  --   --  24* 19  --  14  --  12  CREATININE 0.81  --   1.06  --   --  1.02  --   --  0.88 1.01  --  0.88  --  0.91  AST 15  --   --   --   --   --   --   --   --  25  --  21  --  20  ALT 11  --   --   --   --   --   --   --   --  20  --  18  --  18  ALKPHOS 31*  --   --   --   --   --   --   --   --  56  --  53  --  48  BILITOT 0.5  --   --   --   --   --   --   --   --  1.0  --  0.8  --  0.4  ALBUMIN <1.5*  --   --   --   --   --   --   --   --  2.6*  --  2.5*  --  2.3*  CRP  --   --   --   --    < >  --   --  6.1* 5.6* 6.9*  --  8.2*  --  8.1*  PROCALCITON  --    < > <0.10  --   --   --  <0.10  --  <0.10 <0.10  --  <0.10  --  <0.10  LATICACIDVEN 7.2*  --   --  2.3*  --   --   --   --   --   --   --   --   --   --   TSH  --   --   --   --   --   --   --  2.543  --   --   --   --   --   --   BNP  --   --  85.3  --   --  53.8  --   --   --   --  446.0*  --  214.5* 183.6*  MG  --   --  1.6*  --   --  2.1  --   --   --  1.7  --  1.5*  --  1.8  CALCIUM 7.2*  --  8.6*  --   --  9.2  --   --  8.3* 8.9  --  8.7*  --  8.7*   < > = values in this interval not displayed.     Total Time in preparing paper work, data evaluation and todays exam - 35 minutes  Signature  -    Lala Lund M.D on 10/11/2022 at 7:53 AM   -  To page go to [email or web address]

## 2022-10-11 NOTE — TOC Transition Note (Addendum)
Transition of Care [facility]) - CM/SW Discharge Note   Patient Details  Name: Jacob Mcgrath. MRN: [id number] Date of Birth: 1955/08/25  [facility] CM/SW Contact:  Milas Gain, [place] Phone Number: 10/11/2022, 1:38 PM   Clinical Narrative:     Whitney with [facility] confirmed patient can dc over today if medically ready.  Patient will DC to: [facility]  Anticipated DC date: 10/11/2022  Family notified: Nevin Bloodgood (Legal Guardian)  Transport by: Corey Harold  ?  Per MD patient ready for DC to [facility] . RN, patient, patient's family, and facility notified of DC. Discharge Summary sent to facility. RN given number for report tele# [phone] RM# [id number]. DC packet on chart. Ambulance transport requested for patient.  CSW signing off.    Final next level of care: Skilled Nursing Facility Barriers to Discharge: No Barriers Identified   Patient Goals and CMS Choice CMS Medicare.gov Compare Post Acute Care list provided to:: Legal Guardian    Discharge Placement                Patient chooses bed at:  [facility]) Patient to be transferred to facility by: [facility] Name of family member notified: Nevin Bloodgood legal guardian Patient and family notified of of transfer: 10/11/22  Discharge Plan and Services Additional resources added to the After Visit Summary for                                       Social Determinants of Health (SDOH) Interventions SDOH Screenings   Tobacco Use: High Risk (10/11/2022)     Readmission Risk Interventions     No data to display

## 2022-10-11 NOTE — Plan of Care (Signed)
  Problem: Education: Goal: Knowledge of General Education information will improve Description: Including pain rating scale, medication(s)/side effects and non-pharmacologic comfort measures Outcome: Adequate for Discharge   Problem: Health Behavior/Discharge Planning: Goal: Ability to manage health-related needs will improve Outcome: Adequate for Discharge   Problem: Clinical Measurements: Goal: Ability to maintain clinical measurements within normal limits will improve Outcome: Adequate for Discharge Goal: Will remain free from infection Outcome: Adequate for Discharge Goal: Diagnostic test results will improve Outcome: Adequate for Discharge Goal: Respiratory complications will improve Outcome: Adequate for Discharge Goal: Cardiovascular complication will be avoided Outcome: Adequate for Discharge   Problem: Activity: Goal: Risk for activity intolerance will decrease Outcome: Adequate for Discharge   Problem: Coping: Goal: Level of anxiety will decrease Outcome: Adequate for Discharge   Problem: Elimination: Goal: Will not experience complications related to bowel motility Outcome: Adequate for Discharge Goal: Will not experience complications related to urinary retention Outcome: Adequate for Discharge   Problem: Pain Managment: Goal: General experience of comfort will improve Outcome: Adequate for Discharge   Problem: Safety: Goal: Ability to remain free from injury will improve Outcome: Adequate for Discharge   Problem: Skin Integrity: Goal: Risk for impaired skin integrity will decrease Outcome: Adequate for Discharge   Problem: Education: Goal: Ability to describe self-care measures that may prevent or decrease complications (Diabetes Survival Skills Education) will improve Outcome: Adequate for Discharge Goal: Individualized Educational Video(s) Outcome: Adequate for Discharge   Problem: Coping: Goal: Ability to adjust to condition or change in health  will improve Outcome: Adequate for Discharge   Problem: Metabolic: Goal: Ability to maintain appropriate glucose levels will improve Outcome: Adequate for Discharge   Problem: Nutritional: Goal: Maintenance of adequate nutrition will improve Outcome: Adequate for Discharge Goal: Progress toward achieving an optimal weight will improve Outcome: Adequate for Discharge   Problem: Skin Integrity: Goal: Risk for impaired skin integrity will decrease Outcome: Adequate for Discharge   Problem: Tissue Perfusion: Goal: Adequacy of tissue perfusion will improve Outcome: Adequate for Discharge   Problem: Activity: Goal: Ability to return to baseline activity level will improve Outcome: Adequate for Discharge   Problem: Cardiovascular: Goal: Ability to achieve and maintain adequate cardiovascular perfusion will improve Outcome: Adequate for Discharge Goal: Vascular access site(s) Level 0-1 will be maintained Outcome: Adequate for Discharge   Problem: Health Behavior/Discharge Planning: Goal: Ability to safely manage health-related needs after discharge will improve Outcome: Adequate for Discharge

## 2022-10-11 NOTE — Progress Notes (Signed)
PICC removed pre protocol. Vaseline and 4x4 Gauze applied with manual pressure held for 5 minutes. No bleeding post removal. Patient instructed to leave dressing on for 24-48hours, do not get wet, keep clean dry and intact. If bleeding occurs including return to ED in unable to control with pressure. Patient instructed to remain in bed for 30 minutes. Bed sided nurse aware and out of bed time is 8:58 am.

## 2022-10-11 NOTE — Care Management Important Message (Signed)
Important Message  Patient Details  Name: Jacob Mcgrath. MRN: [id number] Date of Birth: 03/12/1956   Medicare Important Message Given:  Yes     [name] 10/11/2022, 9:26 AM

## 2022-10-13 ENCOUNTER — Telehealth: Payer: Self-pay | Admitting: Physician Assistant

## 2022-10-13 NOTE — Telephone Encounter (Signed)
-----   Message from Dagoberto Ligas, PA-C sent at 10/11/2022  7:08 AM EDT -----  LLE arterial duplex and ABI in 3-4 weeks to see Dr. Stanford Breed.  PO L iliac and SFA stenting Thanks

## 2022-11-02 ENCOUNTER — Other Ambulatory Visit: Payer: Self-pay | Admitting: *Deleted

## 2022-11-02 DIAGNOSIS — I998 Other disorder of circulatory system: Secondary | ICD-10-CM

## 2022-11-09 ENCOUNTER — Ambulatory Visit (HOSPITAL_COMMUNITY)
Admission: RE | Admit: 2022-11-09 | Discharge: 2022-11-09 | Disposition: A | Discharge status: Home / Self Care | Payer: No Typology Code available for payment source | Source: Ambulatory Visit | Attending: Vascular Surgery | Admitting: Vascular Surgery

## 2022-11-09 ENCOUNTER — Encounter: Payer: Self-pay | Admitting: Vascular Surgery

## 2022-11-09 ENCOUNTER — Ambulatory Visit (INDEPENDENT_AMBULATORY_CARE_PROVIDER_SITE_OTHER): Payer: Medicare Other | Admitting: Vascular Surgery

## 2022-11-09 ENCOUNTER — Ambulatory Visit (INDEPENDENT_AMBULATORY_CARE_PROVIDER_SITE_OTHER)
Admission: RE | Admit: 2022-11-09 | Discharge: 2022-11-09 | Disposition: A | Discharge status: Home / Self Care | Payer: No Typology Code available for payment source | Source: Ambulatory Visit | Attending: Vascular Surgery | Admitting: Vascular Surgery

## 2022-11-09 VITALS — BP 118/70 | HR 60 | Temp 97.8°F | Resp 20 | Ht 69.0 in | Wt 127.0 lb

## 2022-11-09 DIAGNOSIS — I739 Peripheral vascular disease, unspecified: Secondary | ICD-10-CM

## 2022-11-09 DIAGNOSIS — I70201 Unspecified atherosclerosis of native arteries of extremities, right leg: Secondary | ICD-10-CM

## 2022-11-09 DIAGNOSIS — I998 Other disorder of circulatory system: Secondary | ICD-10-CM

## 2022-11-09 NOTE — Progress Notes (Signed)
[facility] [place] PROGRESS NOTE  ASSESSMENT / PLAN: Jacob Mcgrath. is a 67 y.o. male has post left iliac and femoral-popliteal angioplasty and stenting for limb threatening ischemia.  His left foot appears to be healing.  I did partially debride his heel ulcer today and a healing ridge was noted at the medial bed.  He could not tolerate bedside debridement with me. will refer him to podiatry to help manage his foot wounds.  He should follow-up with me in 6 months with a repeat ankle-brachial index.  SUBJECTIVE: Well overall.  He is in a nursing facility.  He is getting local wound care to his feet.  His foot wounds are much better.  OBJECTIVE: BP 118/70 (BP Location: Right Arm, Patient Position: Sitting, Cuff Size: Normal)   Pulse 60   Temp 97.8 F (36.6 C)   Resp 20   Ht  (1.753 m)   Wt 127 lb (57.6 kg)   SpO2 95%   BMI 18.75 kg/m   Chronically ill gentleman in no acute distress Regular rate and rhythm Unlabored breathing Warm well-perfused feet Healing ulcers of the dorsal left foot.  Unstageable ulcer of the left heel.     Latest Ref Rng & Units 10/10/2022    4:23 AM 10/09/2022    4:44 AM 10/08/2022    4:33 AM  CBC  WBC 4.0 - 10.5 K/uL 12.7  16.3  16.0   Hemoglobin 13.0 - 17.0 g/dL 8.6  9.1  9.8   Hematocrit 39.0 - 52.0 % 25.8  27.4  29.8   Platelets 150 - 400 K/uL 344  379  411         Latest Ref Rng & Units 10/10/2022    4:23 AM 10/09/2022    4:44 AM 10/08/2022    4:33 AM  CMP  Glucose 70 - 99 mg/dL 161  99  096   BUN 8 - 23 mg/dL Creatinine 0.61 - 1.24 mg/dL 0.45  4.09  8.11   Sodium 135 - 145 mmol/L 138  139  142   Potassium 3.5 - 5.1 mmol/L 3.8  3.8  3.9   Chloride 98 - 111 mmol/L 105  104  108   CO2 22 - 32 mmol/L Calcium 8.9 - 10.3 mg/dL 8.7  8.7  8.9   Total Protein 6.5 - 8.1 g/dL 5.9  6.2  6.4   Total Bilirubin 0.3 - 1.2 mg/dL 0.4  0.8  1.0   Alkaline Phos 38 - 126 U/L 48  53  56   AST 15 - 41  U/L ALT 0 - 44 U/L +-------+-----------+-----------+------------+------------+  ABI/TBIToday's ABIToday's TBIPrevious ABIPrevious TBI  +-------+-----------+-----------+------------+------------+  Right 0.74       0.53                                 +-------+-----------+-----------+------------+------------+  Left  1.11       0.81                                 +-------+-----------+-----------+------------+------------+  Arterial duplex shows patent left SFA stents without stenosis  Rande Brunt. Lenell Antu, MD FACS [facility] Phone Number: [phone])  [phone] 11/09/2022 5:31 PM

## 2022-11-10 LAB — VAS US ABI WITH/WO TBI
Left ABI: 1.11
Right ABI: 0.74

## 2022-11-15 ENCOUNTER — Telehealth: Payer: Self-pay | Admitting: *Deleted

## 2022-11-15 ENCOUNTER — Telehealth: Payer: Self-pay

## 2022-11-15 NOTE — Telephone Encounter (Signed)
Marisue Ivan with [facility] is calling for updated wound  care orders for patient, understand that he has PVD,explained to Marisue Ivan  that the patient has not been seen as of yet(scheduled 04/29)to call back if those orders are still needed , verbalized understanding , she will reach out to his referring physician in the meantime.

## 2022-11-15 NOTE — Telephone Encounter (Signed)
Marisue Ivan with [facility] called requesting clarification on wound care orders.  Reviewed pt's chart, returned call for clarification, no answer, lf vm.

## 2022-11-19 ENCOUNTER — Other Ambulatory Visit: Payer: Self-pay

## 2022-11-19 DIAGNOSIS — I998 Other disorder of circulatory system: Secondary | ICD-10-CM

## 2022-11-19 DIAGNOSIS — I739 Peripheral vascular disease, unspecified: Secondary | ICD-10-CM

## 2022-11-22 ENCOUNTER — Ambulatory Visit: Payer: Medicare Other | Admitting: Podiatry

## 2022-11-30 ENCOUNTER — Encounter: Payer: Self-pay | Admitting: Podiatry

## 2022-11-30 ENCOUNTER — Ambulatory Visit (INDEPENDENT_AMBULATORY_CARE_PROVIDER_SITE_OTHER): Payer: Medicare Other | Admitting: Podiatry

## 2022-11-30 DIAGNOSIS — I739 Peripheral vascular disease, unspecified: Secondary | ICD-10-CM

## 2022-11-30 DIAGNOSIS — L97522 Non-pressure chronic ulcer of other part of left foot with fat layer exposed: Secondary | ICD-10-CM | POA: Diagnosis not present

## 2022-11-30 MED ORDER — COLLAGENASE 250 UNIT/GM EX OINT: 1.0000 | TOPICAL_OINTMENT | Freq: Every day | CUTANEOUS | 0 refills | Status: AC

## 2022-12-05 NOTE — Progress Notes (Signed)
  Subjective:  Patient ID: Jacob Rack., male    DOB: 07-25-1956,  MRN: [id number]  Chief Complaint  Patient presents with   Foot Ulcer    new pt-heel ulcer, unspecified-non req-Dr. Heath Lark refer   Diabetes    67 y.o. male presents with the above complaint. History confirmed with patient.  Recently underwent revascularization 10/04/2022 and 10/08/2022.  He and the family state is healing well.  Objective:  Physical Exam: Thin shiny atrophic skin, multiple ulcerations left dorsal first MPJ and ankle plantar lateral left heel, left medial heel no signs of infection granular wound bed, eschar on the medial heel        Radiographs taken 10/01/2022 show no signs of infection   Assessment:   1. Ulcer of left foot with fat layer exposed (HCC)   2. PAD (peripheral artery disease) (HCC)      Plan:  Patient was evaluated and treated and all questions answered.  Discussed etiology and treatment options of pressure ulceration, currently no signs of infection.  No sharp debridement performed today.  I recommended enzymatic debridement with Santyl collagenase ointment.  Rx sent to pharmacy.  Use and administration reviewed.  Currently wound care is coming to the home to change dressings.  Wound care orders will be faxed to [facility]  Return in about 4 weeks (around 12/28/2022) for wound care.

## 2022-12-28 ENCOUNTER — Ambulatory Visit (INDEPENDENT_AMBULATORY_CARE_PROVIDER_SITE_OTHER): Payer: Medicare Other | Admitting: Podiatry

## 2022-12-28 DIAGNOSIS — I739 Peripheral vascular disease, unspecified: Secondary | ICD-10-CM

## 2022-12-28 DIAGNOSIS — L97522 Non-pressure chronic ulcer of other part of left foot with fat layer exposed: Secondary | ICD-10-CM

## 2022-12-28 NOTE — Progress Notes (Signed)
  Subjective:  Patient ID: Jacob Mcgrath., male    DOB: 1956-04-17,  MRN: [id number]  Chief Complaint  Patient presents with   Foot Ulcer    4 week follow up left foot    67 y.o. male presents with the above complaint. History confirmed with patient.  Doing well, he is now going to the [facility] wound care center  Objective:  Physical Exam: Thin shiny atrophic skin, multiple ulcerations left dorsal first MPJ and ankle plantar lateral left heel has resolved, plantar heel ulceration much smaller dimensions with granular wound bed     Radiographs taken 10/01/2022 show no signs of infection   Assessment:   1. Ulcer of left foot with fat layer exposed (HCC)   2. PAD (peripheral artery disease) (HCC)       Plan:  Patient was evaluated and treated and all questions answered.  Doing much better and appears to be healing uneventfully at this point.  He will continue to follow-up with the wound care center for ongoing soft tissue healing.  I discussed with him that if there is any worsening signs and symptoms of nonhealing or development of infection that he can return to see me for surgical consideration if operative debridement is necessary.  Return if symptoms worsen or fail to improve.

## 2023-02-27 ENCOUNTER — Emergency Department (HOSPITAL_COMMUNITY): Payer: Medicare Other

## 2023-02-27 ENCOUNTER — Other Ambulatory Visit: Payer: Self-pay

## 2023-02-27 ENCOUNTER — Inpatient Hospital Stay (HOSPITAL_COMMUNITY)
Admission: EM | Admit: 2023-02-27 | Discharge: 2023-03-03 | DRG: 871 | Disposition: A | Discharge status: Skilled Nursing Facility | Payer: Medicare Other | Attending: Family Medicine | Admitting: Family Medicine

## 2023-02-27 ENCOUNTER — Encounter (HOSPITAL_COMMUNITY): Payer: Self-pay | Admitting: Emergency Medicine

## 2023-02-27 DIAGNOSIS — Z7984 Long term (current) use of oral hypoglycemic drugs: Secondary | ICD-10-CM

## 2023-02-27 DIAGNOSIS — R6510 Systemic inflammatory response syndrome (SIRS) of non-infectious origin without acute organ dysfunction: Secondary | ICD-10-CM | POA: Insufficient documentation

## 2023-02-27 DIAGNOSIS — W19XXXA Unspecified fall, initial encounter: Principal | ICD-10-CM | POA: Diagnosis present

## 2023-02-27 DIAGNOSIS — Z7982 Long term (current) use of aspirin: Secondary | ICD-10-CM

## 2023-02-27 DIAGNOSIS — Z7902 Long term (current) use of antithrombotics/antiplatelets: Secondary | ICD-10-CM

## 2023-02-27 DIAGNOSIS — N390 Urinary tract infection, site not specified: Secondary | ICD-10-CM | POA: Insufficient documentation

## 2023-02-27 DIAGNOSIS — E872 Acidosis, unspecified: Secondary | ICD-10-CM | POA: Diagnosis present

## 2023-02-27 DIAGNOSIS — R7989 Other specified abnormal findings of blood chemistry: Secondary | ICD-10-CM

## 2023-02-27 DIAGNOSIS — Z681 Body mass index (BMI) 19 or less, adult: Secondary | ICD-10-CM

## 2023-02-27 DIAGNOSIS — A4151 Sepsis due to Escherichia coli [E. coli]: Principal | ICD-10-CM | POA: Diagnosis present

## 2023-02-27 DIAGNOSIS — R251 Tremor, unspecified: Secondary | ICD-10-CM | POA: Insufficient documentation

## 2023-02-27 DIAGNOSIS — Z79899 Other long term (current) drug therapy: Secondary | ICD-10-CM

## 2023-02-27 DIAGNOSIS — F32A Depression, unspecified: Secondary | ICD-10-CM | POA: Insufficient documentation

## 2023-02-27 DIAGNOSIS — R531 Weakness: Secondary | ICD-10-CM | POA: Diagnosis not present

## 2023-02-27 DIAGNOSIS — Z880 Allergy status to penicillin: Secondary | ICD-10-CM

## 2023-02-27 DIAGNOSIS — Z8673 Personal history of transient ischemic attack (TIA), and cerebral infarction without residual deficits: Secondary | ICD-10-CM

## 2023-02-27 DIAGNOSIS — K219 Gastro-esophageal reflux disease without esophagitis: Secondary | ICD-10-CM | POA: Insufficient documentation

## 2023-02-27 DIAGNOSIS — I951 Orthostatic hypotension: Secondary | ICD-10-CM | POA: Diagnosis present

## 2023-02-27 DIAGNOSIS — E43 Unspecified severe protein-calorie malnutrition: Secondary | ICD-10-CM | POA: Diagnosis present

## 2023-02-27 DIAGNOSIS — E1140 Type 2 diabetes mellitus with diabetic neuropathy, unspecified: Secondary | ICD-10-CM | POA: Diagnosis present

## 2023-02-27 DIAGNOSIS — F17210 Nicotine dependence, cigarettes, uncomplicated: Secondary | ICD-10-CM | POA: Diagnosis present

## 2023-02-27 DIAGNOSIS — E11649 Type 2 diabetes mellitus with hypoglycemia without coma: Secondary | ICD-10-CM | POA: Diagnosis present

## 2023-02-27 DIAGNOSIS — Z881 Allergy status to other antibiotic agents status: Secondary | ICD-10-CM

## 2023-02-27 DIAGNOSIS — E119 Type 2 diabetes mellitus without complications: Secondary | ICD-10-CM

## 2023-02-27 DIAGNOSIS — I10 Essential (primary) hypertension: Secondary | ICD-10-CM | POA: Diagnosis present

## 2023-02-27 DIAGNOSIS — F419 Anxiety disorder, unspecified: Secondary | ICD-10-CM | POA: Diagnosis present

## 2023-02-27 LAB — CULTURE, BLOOD (ROUTINE X 2)
Special Requests: ADEQUATE
Special Requests: ADEQUATE

## 2023-02-27 LAB — CBC WITH DIFFERENTIAL/PLATELET
Abs Immature Granulocytes: 0.05 10*3/uL (ref 0.00–0.07)
Basophils Absolute: 0 10*3/uL (ref 0.0–0.1)
Basophils Relative: 0 %
Eosinophils Absolute: 0.2 10*3/uL (ref 0.0–0.5)
Eosinophils Relative: 1 %
HCT: 36.2 % — ABNORMAL LOW (ref 39.0–52.0)
Hemoglobin: 11.5 g/dL — ABNORMAL LOW (ref 13.0–17.0)
Immature Granulocytes: 0 %
Lymphocytes Relative: 27 %
Lymphs Abs: 4.5 10*3/uL — ABNORMAL HIGH (ref 0.7–4.0)
MCH: 29.4 pg (ref 26.0–34.0)
MCHC: 31.8 g/dL (ref 30.0–36.0)
MCV: 92.6 fL (ref 80.0–100.0)
Monocytes Absolute: 1.9 10*3/uL — ABNORMAL HIGH (ref 0.1–1.0)
Monocytes Relative: 12 %
Neutro Abs: 9.7 10*3/uL — ABNORMAL HIGH (ref 1.7–7.7)
Neutrophils Relative %: 60 %
Platelets: 365 10*3/uL (ref 150–400)
RBC: 3.91 MIL/uL — ABNORMAL LOW (ref 4.22–5.81)
RDW: 13.2 % (ref 11.5–15.5)
WBC: 16.3 10*3/uL — ABNORMAL HIGH (ref 4.0–10.5)
nRBC: 0 % (ref 0.0–0.2)

## 2023-02-27 LAB — LACTIC ACID, PLASMA
Lactic Acid, Venous: 3.1 mmol/L (ref 0.5–1.9)
Lactic Acid, Venous: 2.6 mmol/L (ref 0.5–1.9)

## 2023-02-27 LAB — BASIC METABOLIC PANEL
Anion gap: 14 (ref 5–15)
BUN: 14 mg/dL (ref 8–23)
CO2: 19 mmol/L — ABNORMAL LOW (ref 22–32)
Calcium: 9 mg/dL (ref 8.9–10.3)
Chloride: 106 mmol/L (ref 98–111)
Creatinine, Ser: 0.83 mg/dL (ref 0.61–1.24)
GFR, Estimated: >60 mL/min (ref >60)
Glucose, Bld: 111 mg/dL — ABNORMAL HIGH (ref 70–99)
Potassium: 4.6 mmol/L (ref 3.5–5.1)
Sodium: 139 mmol/L (ref 135–145)

## 2023-02-27 LAB — URINALYSIS, ROUTINE W REFLEX MICROSCOPIC
Bilirubin Urine: NEGATIVE
Glucose, UA: NEGATIVE mg/dL
Hgb urine dipstick: NEGATIVE
Ketones, ur: NEGATIVE mg/dL
Nitrite: POSITIVE — AB
Protein, ur: NEGATIVE mg/dL
Specific Gravity, Urine: 1.005 (ref 1.005–1.030)
pH: 7 (ref 5.0–8.0)

## 2023-02-27 LAB — PROTIME-INR
INR: 1 (ref 0.8–1.2)
Prothrombin Time: 13.9 seconds (ref 11.4–15.2)

## 2023-02-27 LAB — GLUCOSE, CAPILLARY
Glucose-Capillary: 65 mg/dL — ABNORMAL LOW (ref 70–99)
Glucose-Capillary: 79 mg/dL (ref 70–99)
Glucose-Capillary: 95 mg/dL (ref 70–99)

## 2023-02-27 LAB — APTT: aPTT: 29 seconds (ref 24–36)

## 2023-02-27 MED ORDER — QUETIAPINE FUMARATE 25 MG PO TABS
25.0000 mg | ORAL_TABLET | Freq: Every day | ORAL | Status: DC
  Administered 2023-02-27: 25 mg via ORAL
  Filled 2023-02-27: qty 1

## 2023-02-27 MED ORDER — CLOPIDOGREL BISULFATE 75 MG PO TABS
75.0000 mg | ORAL_TABLET | Freq: Every day | ORAL | Status: DC
  Administered 2023-02-28 – 2023-03-03 (×4): 75 mg via ORAL
  Filled 2023-02-27 (×4): qty 1

## 2023-02-27 MED ORDER — LACTATED RINGERS IV BOLUS (SEPSIS)
1000.0000 mL | Freq: Once | INTRAVENOUS | Status: AC
  Administered 2023-02-27: 1000 mL via INTRAVENOUS

## 2023-02-27 MED ORDER — ATORVASTATIN CALCIUM 40 MG PO TABS
80.0000 mg | ORAL_TABLET | Freq: Every day | ORAL | Status: DC
  Administered 2023-02-28 – 2023-03-03 (×4): 80 mg via ORAL
  Filled 2023-02-27 (×4): qty 2

## 2023-02-27 MED ORDER — SODIUM CHLORIDE 0.9 % IV SOLN: INTRAVENOUS | Status: DC

## 2023-02-27 MED ORDER — ONDANSETRON HCL 4 MG PO TABS: 4.0000 mg | ORAL_TABLET | Freq: Four times a day (QID) | ORAL | Status: DC | PRN

## 2023-02-27 MED ORDER — LORAZEPAM 2 MG/ML IJ SOLN
1.0000 mg | Freq: Once | INTRAMUSCULAR | Status: AC
  Administered 2023-02-27: 1 mg via INTRAVENOUS
  Filled 2023-02-27: qty 1

## 2023-02-27 MED ORDER — METOPROLOL TARTRATE 5 MG/5ML IV SOLN
5.0000 mg | Freq: Once | INTRAVENOUS | Status: AC
  Administered 2023-02-27: 5 mg via INTRAVENOUS
  Filled 2023-02-27: qty 5

## 2023-02-27 MED ORDER — ROPINIROLE HCL 1 MG PO TABS
1.0000 mg | ORAL_TABLET | Freq: Once | ORAL | Status: AC
  Administered 2023-02-27: 1 mg via ORAL
  Filled 2023-02-27: qty 1

## 2023-02-27 MED ORDER — ACETAMINOPHEN 650 MG RE SUPP: 650.0000 mg | Freq: Four times a day (QID) | RECTAL | Status: DC | PRN

## 2023-02-27 MED ORDER — MORPHINE SULFATE (PF) 2 MG/ML IV SOLN: 2.0000 mg | INTRAVENOUS | Status: DC | PRN

## 2023-02-27 MED ORDER — ACETAMINOPHEN 325 MG PO TABS: 650.0000 mg | ORAL_TABLET | Freq: Four times a day (QID) | ORAL | Status: DC | PRN

## 2023-02-27 MED ORDER — PAROXETINE HCL 10 MG PO TABS
10.0000 mg | ORAL_TABLET | Freq: Every day | ORAL | Status: DC
  Administered 2023-02-28 – 2023-03-03 (×4): 10 mg via ORAL
  Filled 2023-02-27 (×4): qty 1

## 2023-02-27 MED ORDER — MELATONIN 3 MG PO TABS
6.0000 mg | ORAL_TABLET | Freq: Every day | ORAL | Status: DC
  Administered 2023-02-28 – 2023-03-02 (×3): 6 mg via ORAL
  Filled 2023-02-27 (×4): qty 2

## 2023-02-27 MED ORDER — PANTOPRAZOLE SODIUM 40 MG PO TBEC
40.0000 mg | DELAYED_RELEASE_TABLET | Freq: Every day | ORAL | Status: DC
  Administered 2023-02-28 – 2023-03-03 (×4): 40 mg via ORAL
  Filled 2023-02-27 (×5): qty 1

## 2023-02-27 MED ORDER — METOPROLOL TARTRATE 25 MG PO TABS
25.0000 mg | ORAL_TABLET | Freq: Two times a day (BID) | ORAL | Status: DC
  Administered 2023-02-28 – 2023-03-03 (×7): 25 mg via ORAL
  Filled 2023-02-27 (×8): qty 1

## 2023-02-27 MED ORDER — TAMSULOSIN HCL 0.4 MG PO CAPS
0.4000 mg | ORAL_CAPSULE | Freq: Every day | ORAL | Status: DC
  Administered 2023-02-28 – 2023-03-03 (×4): 0.4 mg via ORAL
  Filled 2023-02-27 (×4): qty 1

## 2023-02-27 MED ORDER — INSULIN ASPART 100 UNIT/ML IJ SOLN: 0.0000 [IU] | Freq: Every day | INTRAMUSCULAR | Status: DC

## 2023-02-27 MED ORDER — MIDODRINE HCL 5 MG PO TABS
5.0000 mg | ORAL_TABLET | Freq: Two times a day (BID) | ORAL | Status: DC
  Administered 2023-02-28 – 2023-03-01 (×4): 5 mg via ORAL
  Filled 2023-02-27 (×4): qty 1

## 2023-02-27 MED ORDER — ONDANSETRON HCL 4 MG/2ML IJ SOLN: 4.0000 mg | Freq: Four times a day (QID) | INTRAMUSCULAR | Status: DC | PRN

## 2023-02-27 MED ORDER — SODIUM CHLORIDE 0.9 % IV SOLN
2.0000 g | INTRAVENOUS | Status: DC
  Administered 2023-02-27 – 2023-03-02 (×4): 2 g via INTRAVENOUS
  Filled 2023-02-27 (×4): qty 20

## 2023-02-27 MED ORDER — DULOXETINE HCL 30 MG PO CPEP
30.0000 mg | ORAL_CAPSULE | Freq: Every day | ORAL | Status: DC
  Administered 2023-02-28 – 2023-03-03 (×4): 30 mg via ORAL
  Filled 2023-02-27 (×4): qty 1

## 2023-02-27 MED ORDER — GABAPENTIN 300 MG PO CAPS
300.0000 mg | ORAL_CAPSULE | Freq: Three times a day (TID) | ORAL | Status: DC
  Administered 2023-02-27 – 2023-03-03 (×11): 300 mg via ORAL
  Filled 2023-02-27 (×11): qty 1

## 2023-02-27 MED ORDER — INSULIN ASPART 100 UNIT/ML IJ SOLN
0.0000 [IU] | Freq: Three times a day (TID) | INTRAMUSCULAR | Status: DC
  Administered 2023-03-02: 2 [IU] via SUBCUTANEOUS
  Administered 2023-03-03: 3 [IU] via SUBCUTANEOUS

## 2023-02-27 MED ORDER — LEVETIRACETAM 500 MG PO TABS
500.0000 mg | ORAL_TABLET | Freq: Two times a day (BID) | ORAL | Status: DC
  Administered 2023-02-27 – 2023-03-03 (×8): 500 mg via ORAL
  Filled 2023-02-27 (×8): qty 1

## 2023-02-27 MED ORDER — OXYCODONE HCL 5 MG PO TABS
5.0000 mg | ORAL_TABLET | ORAL | Status: DC | PRN
  Administered 2023-02-27: 5 mg via ORAL
  Filled 2023-02-27 (×2): qty 1

## 2023-02-27 MED ORDER — LACTATED RINGERS IV SOLN: INTRAVENOUS | Status: AC

## 2023-02-27 MED ORDER — LORAZEPAM 1 MG PO TABS
1.0000 mg | ORAL_TABLET | Freq: Once | ORAL | Status: AC
  Administered 2023-02-27: 1 mg via ORAL
  Filled 2023-02-27: qty 1

## 2023-02-27 NOTE — Assessment & Plan Note (Addendum)
-  Chronically on Plavix. -CT head without acute intracranial abnormality -CT C-spine without acute findings -Physical therapy evaluation and recommendations appreciated; patient will benefit up discharge to skilled nursing facility for rehab.

## 2023-02-27 NOTE — ED Notes (Signed)
Pt has a L sided deficit, both arm and leg, states he has had a prior stroke in the past

## 2023-02-27 NOTE — Assessment & Plan Note (Addendum)
-  Left leg tremors - chronic -Continue as needed benzos -Continue to follow clinical response.

## 2023-02-27 NOTE — Assessment & Plan Note (Signed)
-  presumed UTI -malodorous urine -UA pending -Leukocytosis - 16.3 -Lactic acidosis - 3.1 -rocephin started

## 2023-02-27 NOTE — ED Notes (Signed)
Pt L leg shaking appears to have stopped post IV ativan administration

## 2023-02-27 NOTE — ED Notes (Signed)
Pt given a urinal.

## 2023-02-27 NOTE — Assessment & Plan Note (Signed)
Continue protonix  

## 2023-02-27 NOTE — ED Notes (Signed)
Pts L leg shaking uncontrollably, ativan given

## 2023-02-27 NOTE — ED Notes (Signed)
Attempted to call radiology for scan as pt received a dose of IV ativan, was unable to reach anyone at this time

## 2023-02-27 NOTE — ED Notes (Signed)
Pts HR pulse and HR 157, PA-C made aware

## 2023-02-27 NOTE — ED Notes (Signed)
Was able to reach radiology, states they will get pt next as they are currently scanning someone

## 2023-02-27 NOTE — Assessment & Plan Note (Signed)
-  Continue paxil, cymbalta  -No suicidal ideation or hallucination.

## 2023-02-27 NOTE — ED Notes (Addendum)
Pt has a strong, foul smell of urine--PA-C made aware. Pts brief changed, peri care performed

## 2023-02-27 NOTE — Assessment & Plan Note (Addendum)
-  Continue lopressor -Continue to follow vital signs and adjust antihypertensive agents as needed.

## 2023-02-27 NOTE — Assessment & Plan Note (Signed)
-  With intermittent episode of hypoglycemia. -Continue holding oral hypoglycemic agents -Continue following CBGs fluctuation -Sensitive sliding scale insulin in place; instructions provided to not cover CBGs unless the blood sugar above 200.

## 2023-02-27 NOTE — ED Triage Notes (Signed)
Pt fell (lost balance) with cane striking the back of his head. Pt is on plavix. Pt denies any loc.

## 2023-02-27 NOTE — Assessment & Plan Note (Addendum)
-  SIRS/early sepsis in the setting of UTI (abnormal UA, tachycardia and elevated WBCs). -Overall improving  -Continue to maintain adequate hydration -Follow culture results -Continue IV antibiotics.

## 2023-02-27 NOTE — ED Notes (Signed)
Both sets of blood cultures obtained before any antibiotic administration  

## 2023-02-27 NOTE — ED Provider Notes (Signed)
[place] EMERGENCY DEPARTMENT AT [facility] Provider Note   CSN: [id number] Arrival date & time: 02/27/23  1522     History  Chief Complaint  Patient presents with   [facility] Eugean Arnott. is a 67 y.o. male.   Fall Associated symptoms include headaches. Pertinent negatives include no chest pain, no abdominal pain and no shortness of breath.       Elijan Googe. is a 67 y.o. male with past medical history of hypertension, type 2 diabetes, prior stroke (takes aspirin and Plavix) who presents to the Emergency Department resides at local group home sent in for evaluation of fall.  Patient states that he fell in the bathroom.  Is unsure what caused him to fall.  Denies any loss of consciousness.  He complains of pain of his right hand, left hip, left knee and headache.  Patient cannot provide any additional significant history. I spoke with LaThasha at the group home who provided additional history.  She states the fall was unwitnessed.  When she heard him fall, she states that he complaining of pain to his right hand and left leg.  She states that he was briefly slurring his words slightly and appeared tremulous.  She states that he ambulates with a cane at baseline and often loses balance and falls. 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    Home Medications Prior to Admission medications   Medication Sig Start Date End Date Taking? Authorizing Provider  acetaminophen (TYLENOL) 650 MG CR tablet Take 650 mg by mouth every 8 (eight) hours as needed for pain or fever. 08/10/22   [provider]  ASPIRIN ADULT LOW STRENGTH 81 MG EC tablet Take 81 mg by mouth daily. 08/14/21   [provider]  atorvastatin (LIPITOR) 80 MG tablet Take 80 mg by mouth daily. 08/14/21   [provider]  CALCIUM-MAGNESIUM PO Take 1 capsule by mouth daily.    [provider]  cephALEXin (KEFLEX) 500 MG capsule Take 1 capsule (500 mg total) by  mouth every 8 (eight) hours. 10/11/22   Leroy Sea, MD  clopidogrel (PLAVIX) 75 MG tablet Take 1 tablet (75 mg total) by mouth daily with breakfast. 10/11/22   Leroy Sea, MD  diclofenac Sodium (VOLTAREN) 1 % GEL Apply 1 Application topically 2 (two) times daily. 09/21/22   [provider]  DULoxetine (CYMBALTA) 30 MG capsule Take 30 mg by mouth daily. 07/23/22   [provider]  gabapentin (NEURONTIN) 300 MG capsule Take 300 mg by mouth 3 (three) times daily.    [provider]  HYDROcodone-acetaminophen (NORCO/VICODIN) 5-325 MG tablet Take 1 tablet by mouth every 8 (eight) hours as needed for moderate pain. 10/11/22   Leroy Sea, MD  levETIRAcetam (KEPPRA) 500 MG tablet Take 500 mg by mouth 2 (two) times daily. 07/23/22   [provider]  Melatonin 3 MG SUBL Take 2 tablets by mouth at bedtime. 07/23/22   [provider]  metFORMIN (GLUCOPHAGE-XR) 500 MG 24 hr tablet Take 500 mg by mouth 2 (two) times daily. 07/23/22   [provider]  metoprolol tartrate (LOPRESSOR) 25 MG tablet Take 1 tablet (25 mg total) by mouth 2 (two) times daily. 10/11/22   Leroy Sea, MD  metroNIDAZOLE (FLAGYL) 500 MG tablet Take 1 tablet (500 mg total) by mouth 2 (two) times daily. 10/11/22   Leroy Sea, MD  midodrine (PROAMATINE) 5 MG tablet Take 1 tablet (  5 mg total) by mouth 2 (two) times daily with a meal. 10/11/22   Leroy Sea, MD  MUCINEX MAXIMUM STRENGTH 1200 MG TB12 Take 1 tablet by mouth 2 (two) times daily as needed (cold symptoms). 08/10/22   [provider]  pantoprazole (PROTONIX) 40 MG tablet Take 1 tablet (40 mg total) by mouth daily. 10/11/22   Leroy Sea, MD  PARoxetine (PAXIL) 10 MG tablet Take 10 mg by mouth daily. 08/14/21   [provider]  QUEtiapine (SEROQUEL) 25 MG tablet Take 25 mg by mouth at bedtime. 08/14/21   [provider]  tamsulosin (FLOMAX) 0.4 MG CAPS capsule Take 1  capsule (0.4 mg total) by mouth daily. 10/11/22   Leroy Sea, MD      Allergies    Amoxicillin, Ampicillin, and Tetracyclines & related    Review of Systems   Review of Systems  Constitutional:  Negative for appetite change, chills and fever.  Eyes:  Negative for visual disturbance.  Respiratory:  Negative for shortness of breath and wheezing.   Cardiovascular:  Negative for chest pain.  Gastrointestinal:  Negative for abdominal pain.  Musculoskeletal:  Positive for arthralgias (Pain of right hand, left hip, left knee).  Skin:  Positive for wound.       Abrasion scalp  Neurological:  Positive for tremors and headaches. Negative for dizziness, seizures, facial asymmetry and numbness.  Psychiatric/Behavioral:  Negative for confusion.     Physical Exam Updated Vital Signs BP (!) 182/65   Pulse (!) 123   Temp 98.4 F (36.9 C)   Resp 19   Ht 5\' 9"  (1.753 m)   Wt 58 kg   SpO2 94%   BMI 18.88 kg/m  Physical Exam Vitals and nursing note reviewed.  Constitutional:      General: He is not in acute distress.    Appearance: Normal appearance. He is not toxic-appearing.  HENT:     Head:     Comments: Hematoma of the occipital scalp.  Small abrasion noted.  No obvious laceration or active bleeding noted.    Mouth/Throat:     Mouth: Mucous membranes are moist.     Pharynx: Oropharynx is clear.  Eyes:     Extraocular Movements: Extraocular movements intact.     Conjunctiva/sclera: Conjunctivae normal.     Pupils: Pupils are equal, round, and reactive to light.  Cardiovascular:     Rate and Rhythm: Regular rhythm. Tachycardia present.     Pulses: Normal pulses.  Pulmonary:     Effort: Pulmonary effort is normal.     Breath sounds: Normal breath sounds.  Abdominal:     Palpations: Abdomen is soft.     Tenderness: There is no abdominal tenderness.  Musculoskeletal:        General: Tenderness and signs of injury present.     Cervical back: Normal range of motion. No  tenderness.     Comments: Tenderness to palpation dorsal aspect of the right hand.  No bony deformities or edema.  No open wounds.  Patient able to perform full range of motion of the fingers.  He also has some tenderness of the dorsal left hand as well.  He has contracture of the left hand secondary to old stroke.  Unable to perform range of motion of the hand or fingers.  Tenderness to palpation with anterior left knee.  I do not appreciate any bony deformities or effusion.  There is a mild abrasion along the patella.  Patient has  full range of motion of the left hip with some pain throughout range of motion  Skin:    General: Skin is warm.     Capillary Refill: Capillary refill takes less than 2 seconds.  Neurological:     General: No focal deficit present.     Mental Status: He is alert.     Sensory: Sensation is intact. No sensory deficit.     Motor: No weakness.     Comments: Shaking, jerking localized to the left leg.  Alert, oriented to person and place.  Mentating well, no facial droop, speech clear     ED Results / Procedures / Treatments   Labs (all labs ordered are listed, but only abnormal results are displayed) Labs Reviewed  CBC WITH DIFFERENTIAL/PLATELET - Abnormal; Notable for the following components:      Result Value   WBC 16.3 (*)    RBC 3.91 (*)    Hemoglobin 11.5 (*)    HCT 36.2 (*)    Neutro Abs 9.7 (*)    Lymphs Abs 4.5 (*)    Monocytes Absolute 1.9 (*)    All other components within normal limits  BASIC METABOLIC PANEL - Abnormal; Notable for the following components:   CO2 19 (*)    Glucose, Bld 111 (*)    All other components within normal limits  LACTIC ACID, PLASMA - Abnormal; Notable for the following components:   Lactic Acid, Venous 3.1 (*)    All other components within normal limits  CULTURE, BLOOD (ROUTINE X 2)  CULTURE, BLOOD (ROUTINE X 2) W REFLEX TO ID PANEL  PROTIME-INR  APTT  URINALYSIS, ROUTINE W REFLEX MICROSCOPIC  LACTIC ACID, PLASMA     EKG EKG Interpretation Date/Time:  Sunday February 27 2023 18:27:31 EDT Ventricular Rate:  113 PR Interval:  164 QRS Duration:  69 QT Interval:  331 QTC Calculation: 454 R Axis:   74  Text Interpretation: Sinus tachycardia Confirmed by Bethann Berkshire [phone]) on 02/27/2023 7:28:44 PM  Radiology DG Chest Port 1 View  Result Date: 02/27/2023 CLINICAL DATA:  Questionable sepsis - evaluate for abnormality EXAM: PORTABLE CHEST 1 VIEW COMPARISON:  CXR 10/08/22 FINDINGS: No pleural effusion. No pneumothorax. No focal airspace opacity. No radiographically apparent displaced rib fractures. Visualized upper abdomen is unremarkable. There is a hazy opacity in the right base could represent atelectasis infection. Visualized upper abdomen unremarkable. IMPRESSION: Hazy opacity in the right base could represent atelectasis or infection. Electronically Signed   By: Lorenza Cambridge M.D.   On: 02/27/2023 18:21   CT Head Wo Contrast  Result Date: 02/27/2023 CLINICAL DATA:  Fall, on blood thinners EXAM: CT HEAD WITHOUT CONTRAST CT CERVICAL SPINE WITHOUT CONTRAST TECHNIQUE: Multidetector CT imaging of the head and cervical spine was performed following the standard protocol without intravenous contrast. Multiplanar CT image reconstructions of the cervical spine were also generated. RADIATION DOSE REDUCTION: This exam was performed according to the departmental dose-optimization program which includes automated exposure control, adjustment of the mA and/or kV according to patient size and/or use of iterative reconstruction technique. COMPARISON:  MR brain, 01/03/2023 FINDINGS: CT HEAD FINDINGS Brain: No evidence of acute infarction, hemorrhage, hydrocephalus, extra-axial collection or mass lesion/mass effect. Unchanged, chronic right MCA territory encephalomalacia. Vascular: No hyperdense vessel or unexpected calcification. Skull: Normal. Negative for fracture or focal lesion. Sinuses/Orbits: No acute finding. Other: Soft  tissue contusion of the left occiput (series 8, image 22). CT CERVICAL SPINE FINDINGS Alignment: Degenerative straightening of the normal cervical lordosis. Skull  base and vertebrae: No acute fracture. No primary bone lesion or focal pathologic process. Soft tissues and spinal canal: No prevertebral fluid or swelling. No visible canal hematoma. Disc levels: Moderate disc space height loss and osteophytosis of the lower cervical levels. Upper chest: Negative. Other: None. IMPRESSION: 1. No acute intracranial pathology. Unchanged, chronic right MCA territory encephalomalacia. 2. Soft tissue contusion of the left occiput. 3. No fracture or static subluxation of the cervical spine. 4. 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 Electronically Signed   By: Jearld Lesch M.D.   On: 02/27/2023 18:12   CT Cervical Spine Wo Contrast  Result Date: 02/27/2023 CLINICAL DATA:  Fall, on blood thinners EXAM: CT HEAD WITHOUT CONTRAST CT CERVICAL SPINE WITHOUT CONTRAST TECHNIQUE: Multidetector CT imaging of the head and cervical spine was performed following the standard protocol without intravenous contrast. Multiplanar CT image reconstructions of the cervical spine were also generated. RADIATION DOSE REDUCTION: This exam was performed according to the departmental dose-optimization program which includes automated exposure control, adjustment of the mA and/or kV according to patient size and/or use of iterative reconstruction technique. COMPARISON:  MR brain, 01/03/2023 FINDINGS: CT HEAD FINDINGS Brain: No evidence of acute infarction, hemorrhage, hydrocephalus, extra-axial collection or mass lesion/mass effect. Unchanged, chronic right MCA territory encephalomalacia. Vascular: No hyperdense vessel or unexpected calcification. Skull: Normal. Negative for fracture or focal lesion. Sinuses/Orbits: No acute finding. Other: Soft tissue contusion of the left occiput (series 8, image 22). CT CERVICAL SPINE  FINDINGS Alignment: Degenerative straightening of the normal cervical lordosis. Skull base and vertebrae: No acute fracture. No primary bone lesion or focal pathologic process. Soft tissues and spinal canal: No prevertebral fluid or swelling. No visible canal hematoma. Disc levels: Moderate disc space height loss and osteophytosis of the lower cervical levels. Upper chest: Negative. Other: None. IMPRESSION: 1. No acute intracranial pathology. Unchanged, chronic right MCA territory encephalomalacia. 2. Soft tissue contusion of the left occiput. 3. No fracture or static subluxation of the cervical spine. 4. 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 Electronically Signed   By: Jearld Lesch M.D.   On: 02/27/2023 18:12   DG Hand Complete Left  Result Date: 02/27/2023 CLINICAL DATA:  Fall. EXAM: LEFT HAND - COMPLETE 3+ VIEW COMPARISON:  None Available. FINDINGS: There is diffuse decreased bone mineralization. The second and fifth DIP joints are markedly flexed on all views, causing bone overlap and limiting evaluation. The technologist reports the patient's hand was contracted. Oblique view is limited by patient motion artifact. 4 mm ulnar positive variance. Mild joint space narrowing of the intercarpal joints and interphalangeal joints. No definite acute fracture is seen. No dislocation. IMPRESSION: 1. Limited study due to patient contractures and motion artifact. 2. No definite acute fracture is seen. 3. Mild osteoarthritis. Electronically Signed   By: Neita Garnet M.D.   On: 02/27/2023 17:15   DG Hand Complete Right  Result Date: 02/27/2023 CLINICAL DATA:  Fall. EXAM: RIGHT HAND - COMPLETE 3+ VIEW COMPARISON:  None Available. FINDINGS: There is diffuse decreased bone mineralization. Minimal second through fifth DIP joint space narrowing. Moderate thumb carpometacarpal joint space narrowing, subchondral sclerosis, and peripheral osteophytosis. 8 mm ulnar positive variance. No acute  fracture is seen.  No dislocation. IMPRESSION: 1. No acute fracture. 2. Moderate thumb carpometacarpal osteoarthritis. Electronically Signed   By: Neita Garnet M.D.   On: 02/27/2023 17:14   DG Knee Complete 4 Views Left  Result Date: 02/27/2023 CLINICAL DATA:  Fall. EXAM: LEFT  KNEE - COMPLETE 4+ VIEW COMPARISON:  None Available. FINDINGS: There is diffuse decreased bone mineralization. Mild medial compartment joint space narrowing. Lateral view is obliqued and limited by patient motion artifact. No definite acute fracture is seen. No dislocation. Partial visualization of left femoral artery stent graft. Mild-to-moderate atherosclerotic calcifications. IMPRESSION: 1. No definite acute fracture is seen. 2. Mild medial compartment joint space narrowing. Electronically Signed   By: Neita Garnet M.D.   On: 02/27/2023 17:11   DG Hip Unilat W or Wo Pelvis 2-3 Views Left  Result Date: 02/27/2023 CLINICAL DATA:  Fall. EXAM: DG HIP (WITH OR WITHOUT PELVIS) 2-3V LEFT COMPARISON:  Pelvis and left hip radiographs 08/27/2021 FINDINGS: There is diffuse decreased bone mineralization. Status post total left hip arthroplasty. No perihardware lucency is seen to indicate hardware failure or loosening. Severe right femoroacetabular joint space narrowing. Redemonstration of superior right femoral head subchondral lucency suspicious for a cortical defect with surrounding sclerosis. Mild-to-moderate right femoral head-neck junction degenerative osteophytosis. The pubic symphysis joint space is maintained. No acute fracture is seen. There are new likely left common iliac, external iliac, and superficial femoral stent grafts compared to prior. IMPRESSION: 1. Status post total left hip arthroplasty. No evidence of hardware failure or loosening. 2. Severe right femoroacetabular osteoarthritis. Redemonstration of superior right femoral head subchondral lucency suspicious for a cortical defect with surrounding sclerosis. Findings are  again suggestive of a remote subchondral insufficiency fracture and possible avascular necrosis. Electronically Signed   By: Neita Garnet M.D.   On: 02/27/2023 17:10    Procedures Procedures    CRITICAL CARE Performed by: Loucile Posner Total critical care time: 35 minutes Critical care time was exclusive of separately billable procedures and treating other patients. Critical care was necessary to treat or prevent imminent or life-threatening deterioration. Critical care was time spent personally by me on the following activities: development of treatment plan with patient and/or surrogate as well as nursing, discussions with consultants, evaluation of patient's response to treatment, examination of patient, obtaining history from patient or surrogate, ordering and performing treatments and interventions, ordering and review of laboratory studies, ordering and review of radiographic studies, pulse oximetry and re-evaluation of patient's condition.  Patient here status post fall with tachycardia head injury while on blood thinner.  likely sepsis, IV antibiotics, fluids, he has required IV Ativan and Lopressor as well for his tachycardia and tremulous extremity   Medications Ordered in ED Medications  lactated ringers infusion (has no administration in time range)  lactated ringers bolus 1,000 mL (has no administration in time range)    And  lactated ringers bolus 1,000 mL (has no administration in time range)  cefTRIAXone (ROCEPHIN) 2 g in sodium chloride 0.9 % 100 mL IVPB (has no administration in time range)  LORazepam (ATIVAN) tablet 1 mg (1 mg Oral Given 02/27/23 1642)  LORazepam (ATIVAN) injection 1 mg (1 mg Intravenous Given 02/27/23 1719)  metoprolol tartrate (LOPRESSOR) injection 5 mg (5 mg Intravenous Given 02/27/23 1718)  lactated ringers bolus 1,000 mL (1,000 mLs Intravenous Bolus 02/27/23 1834)    ED Course/ Medical Decision Making/ A&P                                 Medical  Decision Making Patient here from local group home for evaluation of fall.  Fall was unwitnessed.  Patient unable to provide significant history.  Complains of pain to both hands left knee and  left hip.  Also has headache.  I spoke with caregiver at the facility who confirmed that fall was unwitnessed she states that he often loses his balance and falls related to a cane at baseline.  She states that he appeared shaky right after the fall and that he appeared to briefly slur his words  Differential includes but not limited to intracranial bleed, skull fracture, sepsis, stroke, TIA, cardiac process preceding the fall  Amount and/or Complexity of Data Reviewed Labs: ordered.    Details: Labs interpreted by me, leukocytosis with white count of 16,000, chemistries without significant derangement, initial lactic of 3.1. Radiology: ordered.    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    Details: EKG shows sinus tachycardia Discussion of management or test interpretation with external provider(s): Patient also seen by Dr. Estell Harpin and care plan discussed.  Patient has tachycardia and shaking jerking localized to the left leg only.  Low clinical suspicion for seizure.  Is on Lopressor, will trial Lopressor and Ativan  Remains tachycardic, afebrile tachycardia has improved after IV Ativan and Lopressor.  Left extremity no longer tremulous  Lactic acid elevated, IV fluids given presumptive urosepsis, Rocephin ordered.  Urine has been obtained foul-smelling on exam.  Discussed findings with [facility] hospitalist, Dr. Carren Rang agrees to  admit    Risk Prescription drug management.           Final Clinical Impression(s) / ED Diagnoses Final diagnoses:  Fall, initial encounter  Elevated lactic acid level    Rx / DC Orders ED Discharge Orders     None         Rosey Bath 02/27/23 Margarette Asal, MD 02/28/23 1159

## 2023-02-27 NOTE — ED Notes (Signed)
Patient transported to CT 

## 2023-02-27 NOTE — ED Notes (Signed)
Per radiology, pt unable to be scanned at this time due to shaking uncontrollable, po ativan ordered and  given.

## 2023-02-28 ENCOUNTER — Observation Stay (HOSPITAL_COMMUNITY): Payer: Medicare Other

## 2023-02-28 DIAGNOSIS — W19XXXA Unspecified fall, initial encounter: Secondary | ICD-10-CM

## 2023-02-28 DIAGNOSIS — Z7982 Long term (current) use of aspirin: Secondary | ICD-10-CM | POA: Diagnosis not present

## 2023-02-28 DIAGNOSIS — Z880 Allergy status to penicillin: Secondary | ICD-10-CM | POA: Diagnosis not present

## 2023-02-28 DIAGNOSIS — R251 Tremor, unspecified: Secondary | ICD-10-CM | POA: Diagnosis present

## 2023-02-28 DIAGNOSIS — E872 Acidosis, unspecified: Secondary | ICD-10-CM | POA: Diagnosis present

## 2023-02-28 DIAGNOSIS — W19XXXD Unspecified fall, subsequent encounter: Secondary | ICD-10-CM | POA: Diagnosis not present

## 2023-02-28 DIAGNOSIS — E43 Unspecified severe protein-calorie malnutrition: Secondary | ICD-10-CM | POA: Diagnosis present

## 2023-02-28 DIAGNOSIS — R6510 Systemic inflammatory response syndrome (SIRS) of non-infectious origin without acute organ dysfunction: Secondary | ICD-10-CM | POA: Diagnosis not present

## 2023-02-28 DIAGNOSIS — F32A Depression, unspecified: Secondary | ICD-10-CM

## 2023-02-28 DIAGNOSIS — Z8673 Personal history of transient ischemic attack (TIA), and cerebral infarction without residual deficits: Secondary | ICD-10-CM | POA: Diagnosis not present

## 2023-02-28 DIAGNOSIS — E119 Type 2 diabetes mellitus without complications: Secondary | ICD-10-CM

## 2023-02-28 DIAGNOSIS — F419 Anxiety disorder, unspecified: Secondary | ICD-10-CM | POA: Diagnosis present

## 2023-02-28 DIAGNOSIS — Z681 Body mass index (BMI) 19 or less, adult: Secondary | ICD-10-CM | POA: Diagnosis not present

## 2023-02-28 DIAGNOSIS — I10 Essential (primary) hypertension: Secondary | ICD-10-CM

## 2023-02-28 DIAGNOSIS — E11649 Type 2 diabetes mellitus with hypoglycemia without coma: Secondary | ICD-10-CM | POA: Diagnosis present

## 2023-02-28 DIAGNOSIS — N3000 Acute cystitis without hematuria: Secondary | ICD-10-CM

## 2023-02-28 DIAGNOSIS — E1140 Type 2 diabetes mellitus with diabetic neuropathy, unspecified: Secondary | ICD-10-CM | POA: Diagnosis present

## 2023-02-28 DIAGNOSIS — I951 Orthostatic hypotension: Secondary | ICD-10-CM | POA: Diagnosis present

## 2023-02-28 DIAGNOSIS — N390 Urinary tract infection, site not specified: Secondary | ICD-10-CM | POA: Diagnosis present

## 2023-02-28 DIAGNOSIS — Z79899 Other long term (current) drug therapy: Secondary | ICD-10-CM | POA: Diagnosis not present

## 2023-02-28 DIAGNOSIS — F17210 Nicotine dependence, cigarettes, uncomplicated: Secondary | ICD-10-CM | POA: Diagnosis present

## 2023-02-28 DIAGNOSIS — K219 Gastro-esophageal reflux disease without esophagitis: Secondary | ICD-10-CM

## 2023-02-28 DIAGNOSIS — Z7902 Long term (current) use of antithrombotics/antiplatelets: Secondary | ICD-10-CM | POA: Diagnosis not present

## 2023-02-28 DIAGNOSIS — R531 Weakness: Secondary | ICD-10-CM | POA: Diagnosis present

## 2023-02-28 DIAGNOSIS — R7989 Other specified abnormal findings of blood chemistry: Secondary | ICD-10-CM

## 2023-02-28 DIAGNOSIS — A4151 Sepsis due to Escherichia coli [E. coli]: Secondary | ICD-10-CM | POA: Diagnosis present

## 2023-02-28 DIAGNOSIS — Z881 Allergy status to other antibiotic agents status: Secondary | ICD-10-CM | POA: Diagnosis not present

## 2023-02-28 DIAGNOSIS — Z7984 Long term (current) use of oral hypoglycemic drugs: Secondary | ICD-10-CM | POA: Diagnosis not present

## 2023-02-28 LAB — GLUCOSE, CAPILLARY
Glucose-Capillary: 43 mg/dL — CL (ref 70–99)
Glucose-Capillary: 84 mg/dL (ref 70–99)
Glucose-Capillary: 56 mg/dL — ABNORMAL LOW (ref 70–99)
Glucose-Capillary: 199 mg/dL — ABNORMAL HIGH (ref 70–99)
Glucose-Capillary: 98 mg/dL (ref 70–99)
Glucose-Capillary: 87 mg/dL (ref 70–99)
Glucose-Capillary: 115 mg/dL — ABNORMAL HIGH (ref 70–99)

## 2023-02-28 LAB — LACTIC ACID, PLASMA: Lactic Acid, Venous: 1.9 mmol/L (ref 0.5–1.9)

## 2023-02-28 LAB — HIV ANTIBODY (ROUTINE TESTING W REFLEX): HIV Screen 4th Generation wRfx: NONREACTIVE

## 2023-02-28 MED ORDER — DEXTROSE 50 % IV SOLN
1.0000 | Freq: Once | INTRAVENOUS | Status: AC
  Administered 2023-02-28: 50 mL via INTRAVENOUS
  Filled 2023-02-28: qty 50

## 2023-02-28 MED ORDER — GLUCOSE 40 % PO GEL
ORAL | Status: AC
  Filled 2023-02-28: qty 1.21

## 2023-02-28 NOTE — H&P (Signed)
History and Physical    Patient: Jacob Mcgrath. ZOX:[id number] DOB: [birth year] DOA: 02/27/2023 DOS: the patient was seen and examined on 02/28/2023 PCP: Benetta Spar, MD  Patient coming from:  group home  Chief Complaint:  Chief Complaint  Patient presents with   Fall   HPI: Jacob Mcgrath. is a 67 y.o. 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  At first, when asked patient why he is here he says he does not remember.  When I reminded him that he fell faces he does remember that he fell backwards.  He is not able to provide details regarding the fall.  I asked him if he tripped, lost his balance, or became too weak to stand, but he does not remember.  Patient does ambulate with a cane at baseline.  He did strike the back of his head.  He is on aspirin and Plavix.  He is not on any anticoagulation.  ED provider spoke with an aide at the group home who stated that the fall was unwitnessed.  She heard him fall, and then he was complaining of pain in his right hand and left leg.  He also had isolated tremors of his left lower extremity, which apparently is not new for him.  He has intermittent tremors of that extremity.  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  Further history could be obtained at this time.  Patient has no ACP documents and there was no DNR bedside   Review of Systems: unable to review all systems due to the inability of the patient to answer questions. Past Medical History:  Diagnosis Date   Diabetes mellitus without complication (HCC)    Hypertension    Past Surgical History:  Procedure Laterality Date   ABDOMINAL AORTOGRAM W/LOWER EXTREMITY N/A 10/04/2022   Procedure: ABDOMINAL AORTOGRAM W/LOWER  EXTREMITY;  Surgeon: Chuck Hint, MD;  Location: [facility] INVASIVE CV LAB;  Service: Cardiovascular;  Laterality: N/A;   ABDOMINAL AORTOGRAM W/LOWER EXTREMITY N/A 10/08/2022   Procedure: ABDOMINAL AORTOGRAM W/LOWER EXTREMITY;  Surgeon: Leonie Douglas, MD;  Location: [facility] INVASIVE CV LAB;  Service: Cardiovascular;  Laterality: N/A;   PERIPHERAL VASCULAR INTERVENTION  10/04/2022   Procedure: PERIPHERAL VASCULAR INTERVENTION;  Surgeon: Chuck Hint, MD;  Location: [facility] INVASIVE CV LAB;  Service: Cardiovascular;;   PERIPHERAL VASCULAR INTERVENTION Left 10/08/2022   Procedure: PERIPHERAL VASCULAR INTERVENTION;  Surgeon: Leonie Douglas, MD;  Location: [facility] INVASIVE CV LAB;  Service: Cardiovascular;  Laterality: Left;   TONSILLECTOMY     Social History:  reports that he has been smoking cigarettes. He does not have any smokeless tobacco history on file. He reports that he does not currently use alcohol. He reports that he does not currently use drugs.  Allergies  Allergen Reactions   Amoxicillin Other (See Comments)    Unknown reaction per facility   Ampicillin Other (See Comments)    Unknown reaction per facility   Tetracyclines & Related Rash    History reviewed. No pertinent family history.  Prior to Admission medications   Medication Sig Start Date End Date Taking? Authorizing Provider  acetaminophen (TYLENOL) 650 MG CR tablet Take 650 mg by mouth every 8 (eight) hours as needed for pain or fever. 08/10/22   [provider]  ASPIRIN ADULT LOW STRENGTH 81 MG EC tablet Take 81 mg by mouth daily. 08/14/21   [provider]  atorvastatin (LIPITOR) 80 MG tablet Take 80 mg by mouth daily. 08/14/21   [provider]  CALCIUM-MAGNESIUM PO Take 1 capsule by mouth daily.    [provider]  cephALEXin (KEFLEX) 500 MG capsule Take 1 capsule (500 mg total) by mouth every 8 (eight) hours. 10/11/22   Leroy Sea, MD  clopidogrel (PLAVIX) 75 MG tablet Take 1  tablet (75 mg total) by mouth daily with breakfast. 10/11/22   Leroy Sea, MD  diclofenac Sodium (VOLTAREN) 1 % GEL Apply 1 Application topically 2 (two) times daily. 09/21/22   [provider]  DULoxetine (CYMBALTA) 30 MG capsule Take 30 mg by mouth daily. 07/23/22   [provider]  gabapentin (NEURONTIN) 300 MG capsule Take 300 mg by mouth 3 (three) times daily.    [provider]  HYDROcodone-acetaminophen (NORCO/VICODIN) 5-325 MG tablet Take 1 tablet by mouth every 8 (eight) hours as needed for moderate pain. 10/11/22   Leroy Sea, MD  levETIRAcetam (KEPPRA) 500 MG tablet Take 500 mg by mouth 2 (two) times daily. 07/23/22   [provider]  Melatonin 3 MG SUBL Take 2 tablets by mouth at bedtime. 07/23/22   [provider]  metFORMIN (GLUCOPHAGE-XR) 500 MG 24 hr tablet Take 500 mg by mouth 2 (two) times daily. 07/23/22   [provider]  metoprolol tartrate (LOPRESSOR) 25 MG tablet Take 1 tablet (25 mg total) by mouth 2 (two) times daily. 10/11/22   Leroy Sea, MD  metroNIDAZOLE (FLAGYL) 500 MG tablet Take 1 tablet (500 mg total) by mouth 2 (two) times daily. 10/11/22   Leroy Sea, MD  midodrine (PROAMATINE) 5 MG tablet Take 1 tablet (5 mg total) by mouth 2 (two) times daily with a meal. 10/11/22   Leroy Sea, MD  MUCINEX MAXIMUM STRENGTH 1200 MG TB12 Take 1 tablet by mouth 2 (two) times daily as needed (cold symptoms). 08/10/22   [provider]  pantoprazole (PROTONIX) 40 MG tablet Take 1 tablet (40 mg total) by mouth daily. 10/11/22   Leroy Sea, MD  PARoxetine (PAXIL) 10 MG tablet Take 10 mg by mouth daily. 08/14/21   [provider]  QUEtiapine (SEROQUEL) 25 MG tablet Take 25 mg by mouth at bedtime. 08/14/21   [provider]  tamsulosin (FLOMAX) 0.4 MG CAPS capsule Take 1 capsule (0.4 mg total) by mouth daily. 10/11/22   Leroy Sea, MD    Physical Exam: Vitals:    02/27/23 1918 02/27/23 2000 02/27/23 2106 02/27/23 2114  BP: (!) 181/84 (!) 179/81 (!) 112/95   Pulse: (!) 114 (!) 111 80   Resp: 18 20 20    Temp:  98.4 F (36.9 C) 98.2 F (36.8 C)   TempSrc:  Oral Oral   SpO2: 100% 100% 100% 100%  Weight:      Height:       1.  General: Patient lying supine in bed,  no acute distress   2. Psychiatric: Alert and oriented x 3, mood and behavior normal for situation, pleasant and cooperative with exam   3. Neurologic: Speech and language are normal, face is symmetric, moves all 4 extremities voluntarily, at baseline without acute deficits on limited exam   4. HEENMT:  Head is atraumatic, normocephalic, pupils reactive to light, neck is supple, trachea is midline, mucous membranes are moist  5. Respiratory : Lungs are clear to auscultation bilaterally without wheezing, rhonchi, rales, no cyanosis, no increase in work of breathing or accessory muscle use   6. Cardiovascular : Heart rate normal, rhythm is regular, murmur present but no rubs or gallops, no peripheral edema, peripheral pulses palpated   7. Gastrointestinal:  Abdomen is soft, nondistended, epigastric tenderness to palpation, bowel sounds active, no masses or organomegaly palpated   8. Skin:  Skin is warm, dry and intact without rashes, acute lesions, or ulcers on limited exam   9.Musculoskeletal:  No acute deformities or trauma, no asymmetry in tone, no peripheral edema, peripheral pulses palpated, tenderness to palpation of left lower extremity Data Reviewed: In the ED Patient was afebrile but he was tachycardic from 114-159, tachypneic from 18-34, and hypertensive Patient had a leukocytosis of 16.3, hemoglobin stable at 11.5 Chemistry is unremarkable aside from a decreased bicarb at 19 Lactic acid 3.1, then 2.6 CT head showed no intracranial pathology CT C-spine showed no fracture or subluxation of the cervical spine Chest x-ray showed a right base atelectasis that could be  infection Left hand x-ray was limited due to patient contractures and motion artifact but no fracture seen Right hand x-ray showed no acute fracture pelvis x-ray showed left hip arthroplasty.  No evidence of hardware failure or loosening.  Severe arthritis and chronic findings in the hip  left knee shows no definite acute fracture Presumed UTI with malodorous urine and then UA did come back suspicious for UTI so urine culture is pending Admission was requested due to SIRS criteria and possible urinary tract infection Assessment and Plan: * Fall -Fell backwards, striking head -On plavix  -CT head without acute intracranial abnormality -CT C-spine without acute findings -PT eval and treat -Continue to monitor  Depression -Continue paxil, cymbalta  -Continue ativan PRN -Continue to monitor  GERD (gastroesophageal reflux disease) -Continue protonix  Tremor -Left leg tremors - chronic -continue benzo  -one dose trial of requip -Continue to monitor  UTI (urinary tract infection) -presumed UTI -malodorous urine -UA pending -Leukocytosis - 16.3 -Lactic acidosis - 3.1 -rocephin started  SIRS (systemic inflammatory response syndrome) (HCC) -HR 114 - 159 -Lopressor - 5mg  IV in ED -1L bolus in ED -Likely UTI - UA pending -Leukocytosis 16.3 -Possible pneumonia on CXR - no respiratory systems -Continue rocephin -Continue to monitor   Diabetes mellitus without complication (HCC) -Hold metformin  -Sliding scale coverage -monitor CBGs  Hypertension -Continue lopressor      Advance Care Planning:   Code Status: Full Code  Consults: None at this time  Family Communication: No family at bedside  Severity of Illness: The appropriate patient status for this patient is OBSERVATION. Observation status is judged to be reasonable and necessary in order to provide the required intensity of service to ensure the patient's safety. The patient's presenting symptoms, physical exam  findings, and initial radiographic and laboratory data in the context of their medical condition is felt to place them at decreased risk for further clinical deterioration. Furthermore, it is anticipated that the patient will be medically stable for discharge from the hospital within 2 midnights of admission.   Author: Lilyan Gilford, DO 02/28/2023 12:48 AM  For on call review [email or web address].

## 2023-02-28 NOTE — Progress Notes (Signed)
Hypoglycemic Event  CBG: 65 at 2123  Treatment: 4 oz juice/soda  Symptoms: None  Follow-up CBG: Time:2140 CBG Result:95  Possible Reasons for Event: Inadequate meal intake  Comments/MD notified:A. Zierle-Ghosh no new orders     Moishe Spice

## 2023-02-28 NOTE — Significant Event (Signed)
   02/27/23 2130  Provider Notification  Provider Name/Title Mitchell Heir  Date Provider Notified 02/27/23  Time Provider Notified 2130  Method of Notification Page  Notification Reason Critical Result;Other (Comment) (lactic 2.6 improved from 3.1 Blood sugar 65 placed orders per hypoglycemic standing orders)  Provider response No new orders  Date of Provider Response 02/27/23  Time of Provider Response 2131

## 2023-02-28 NOTE — Plan of Care (Signed)
  Problem: Acute Rehab PT Goals(only PT should resolve) Goal: Pt will Roll Supine to Side Outcome: Progressing Flowsheets (Taken 02/28/2023 1220) Pt will Roll Supine to Side: with min assist Goal: Pt Will Go Supine/Side To Sit Outcome: Progressing Flowsheets (Taken 02/28/2023 1220) Pt will go Supine/Side to Sit:  with minimal assist  with HOB elevated Goal: Pt Will Go Sit To Supine/Side Outcome: Progressing Flowsheets (Taken 02/28/2023 1220) Pt will go Sit to Supine/Side: with moderate assist Goal: Patient Will Perform Sitting Balance Outcome: Progressing Flowsheets (Taken 02/28/2023 1220) Patient will perform sitting balance:  1-2 min  with minimal assist  with unilateral UE support Goal: Patient Will Transfer Sit To/From Stand Outcome: Progressing Flowsheets (Taken 02/28/2023 1220) Patient will transfer sit to/from stand:  with moderate assist  from elevated surface Goal: Pt Will Transfer Bed To Chair/Chair To Bed Outcome: Progressing Flowsheets (Taken 02/28/2023 1220) Pt will Transfer Bed to Chair/Chair to Bed: with max assist   Britta Mccreedy D. Hartnett-Rands, MS, PT Per Diem PT [facility] [phone] 02/28/2023

## 2023-02-28 NOTE — Evaluation (Signed)
Physical Therapy Evaluation Patient Details Name: Jacob Mcgrath. MRN: [id number] DOB: 1956/07/24 Today's Date: 02/28/2023  History of Present Illness  Jacob Lamart Annis. is a 67 y.o. 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  At first, when asked patient why he is here he says he does not remember.  When I reminded him that he fell faces he does remember that he fell backwards.  He is not able to provide details regarding the fall.  I asked him if he tripped, lost his balance, or became too weak to stand, but he does not remember.  Patient does ambulate with a cane at baseline.  He did strike the back of his head.  He is on aspirin and Plavix.  He is not on any anticoagulation.  ED provider spoke with an aide at the group home who stated that the fall was unwitnessed.  She heard him fall, and then he was complaining of pain in his right hand and left leg.  He also had isolated tremors of his left lower extremity, which apparently is not new for him.  He has intermittent tremors of that extremity.  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  Further history could be obtained at this time.   Clinical Impression  Pt admitted with above diagnosis. Patient supine in bed upon therapist arrival and agreeable to participating in evaluation. Patient requires min to max assistance for bed mobility. Patient initially leaning backwards and toward his right upon sitting requiring assistance. When attempting sit to stand transfers, patient pushing back against therapist's assistance for forward lean to attain sit to stand transfer. Multi-modal cuing given with and without use of patient's personal SPC in the room. Multiple attempts for sit to stand transfer with  continued pushing and leaning backwards resisting forward movement. Cues given to transition sit to supine. Patient resisted left lateral lean necessary to complete supine to sit. Max assist given to complete sit to supine. Max assist of 2 to boost up in bed. Dr Gwenlyn Perking present for much of mobility assessment. Pt currently with functional limitations due to the deficits listed below (see PT Problem List). Pt will benefit from acute skilled PT to increase their independence and safety with mobility to allow discharge.           If plan is discharge home, recommend the following: A lot of help with walking and/or transfers;A lot of help with bathing/dressing/bathroom;Help with stairs or ramp for entrance   Can travel by private vehicle   No    Equipment Recommendations None recommended by PT (to be determined at next venue of care)  Recommendations for Other Services       Functional Status Assessment Patient has had a recent decline in their functional status and demonstrates the ability to make significant improvements in function in a reasonable and predictable amount of time.     Precautions / Restrictions Precautions Precautions: Fall Precaution Comments: fall prior to admission Restrictions Weight Bearing Restrictions: No      Mobility  Bed Mobility Overal bed mobility: Needs Assistance Bed Mobility: Supine to Sit, Sit to Supine     Supine to sit: Min assist Sit to supine: Max assist   General bed mobility comments: supine to sit- assist from trunk; sit  to supine - patient resisting transitioning to left trunk lean for transition to supine; max assist of 2 to boost up in bed; mulitmodal cuing; initially leaning posteriorly    Transfers Overall transfer level: Needs assistance Equipment used: Straight cane Transfers: Sit to/from Stand Sit to Stand: Max assist           General transfer comment: multimodal cuing; leaning posteriorly, resisting forward lean stating he  was going to fall forward    Ambulation/Gait     General Gait Details: not attempted due to safety concerns  Stairs            Wheelchair Mobility     Tilt Bed    Modified Rankin (Stroke Patients Only)       Balance Overall balance assessment: Needs assistance Sitting-balance support: Single extremity supported, Feet supported Sitting balance-Leahy Scale: Poor Sitting balance - Comments: seated at EOB Postural control: Posterior lean Standing balance support: Single extremity supported, During functional activity, Reliant on assistive device for balance Standing balance-Leahy Scale: Zero Standing balance comment: standing at EOB         Pertinent Vitals/Pain Pain Assessment Pain Assessment: PAINAD Breathing: normal Negative Vocalization: none Body Language: relaxed Consolability: no need to console Pain Intervention(s): Limited activity within patient's tolerance, Monitored during session, Repositioned    Home Living Family/patient expects to be discharged to:: Group home       Home Equipment: Cane - single point      Prior Function Prior Level of Function : Needs assist;History of Falls (last six months)  Cognitive Assist : ADLs (cognitive)     Physical Assist : ADLs (physical);Mobility (physical)             Hand Dominance        Extremity/Trunk Assessment   Upper Extremity Assessment Upper Extremity Assessment: LUE deficits/detail;Generalized weakness LUE Deficits / Details: flaccid/contractured LUE Coordination: decreased fine motor;decreased gross motor    Lower Extremity Assessment Lower Extremity Assessment: Generalized weakness;LLE deficits/detail LLE Deficits / Details: knee to toes exhibit history of many wounds/injuries in multiple stages of healing/scarring LLE Coordination: decreased gross motor    Cervical / Trunk Assessment Cervical / Trunk Assessment: Normal  Communication   Communication: Receptive  difficulties;Expressive difficulties  Cognition Arousal/Alertness: Awake/alert Behavior During Therapy: WFL for tasks assessed/performed Overall Cognitive Status: Within Functional Limits for tasks assessed          General Comments      Exercises     Assessment/Plan    PT Assessment Patient needs continued PT services  PT Problem List Decreased strength;Decreased knowledge of use of DME;Decreased activity tolerance;Decreased safety awareness;Decreased balance;Decreased mobility;Decreased coordination       PT Treatment Interventions DME instruction;Balance training;Gait training;Patient/family education;Functional mobility training;Therapeutic activities;Therapeutic exercise    PT Goals (Current goals can be found in the Care Plan section)  Acute Rehab PT Goals Patient Stated Goal: Go back to group home. PT Goal Formulation: With patient Time For Goal Achievement: 03/14/23 Potential to Achieve Goals: Fair    Frequency Min 3X/week        AM-PAC PT "6 Clicks" Mobility  Outcome Measure Help needed turning from your back to your side while in a flat bed without using bedrails?: A Little Help needed moving from lying on your back to sitting on the side of a flat bed without using bedrails?: A Lot Help needed moving to and from a bed to a chair (including a wheelchair)?: A Lot Help needed standing up from a chair using  your arms (e.g., wheelchair or bedside chair)?: A Lot Help needed to walk in hospital room?: Total Help needed climbing 3-5 steps with a railing? : Total 6 Click Score: 11    End of Session   Activity Tolerance: Treatment limited secondary to agitation;Patient limited by pain Patient left: in bed;with call bell/phone within reach;with bed alarm set Nurse Communication: Mobility status PT Visit Diagnosis: Unsteadiness on feet (R26.81);Difficulty in walking, not elsewhere classified (R26.2);Other abnormalities of gait and mobility (R26.89);History of falling  (Z91.81);Muscle weakness (generalized) (M62.81);Other symptoms and signs involving the nervous system (R29.898)    Time: 6644-0347 PT Time Calculation (min) (ACUTE ONLY): 28 min   Charges:   PT Evaluation $PT Eval Low Complexity: 1 Low PT Treatments $Therapeutic Activity: 8-22 mins PT General Charges $$ ACUTE PT VISIT: 1 Visit         Katina Dung. Hartnett-Rands, MS, PT Per Diem PT [facility] [place] [phone]  Britta Mccreedy  Hartnett-Rands 02/28/2023, 12:13 PM

## 2023-02-28 NOTE — TOC Initial Note (Signed)
Transition of Care (TOC) - Initial/Assessment Note    Patient Details  Name: Jacob Mcgrath. MRN: [id number] Date of Birth: August 05, 1955  Transition of Care [facility]) CM/SW Contact:    Leitha Bleak, RN Phone Number: 02/28/2023, 1:00 PM  Clinical Narrative:     Patient admitted after a fall. [facility] [address] [place], Chart updated. Patient has a new legal guardian Jacob Mcgrath, added her number to the chart. PT is recommending SNF. Patient is in OBS, [facility] ACO. Jacob Mcgrath agrees with SNF, first choice [facility], second choice [facility]. TOC sending FL2 and emailing to [employer].               Expected Discharge Plan: Skilled Nursing Facility Barriers to Discharge: Continued Medical Work up   Patient Goals and CMS Choice Patient states their goals for this hospitalization and ongoing recovery are:: to go to SNF CMS Medicare.gov Compare Post Acute Care list provided to:: Legal Guardian       Expected Discharge Plan and Services     Post Acute Care Choice: Skilled Nursing Facility Living arrangements for the past 2 months: Group Home           Prior Living Arrangements/Services Living arrangements for the past 2 months: Group Home Lives with:: Facility Resident Patient language and need for interpreter reviewed:: Yes        Need for Family Participation in Patient Care: Yes (Comment) Care giver support system in place?: Yes (comment) Current home services: DME Criminal Activity/Legal Involvement Pertinent to Current Situation/Hospitalization: No - Comment as needed  Activities of Daily Living Home Assistive Devices/Equipment: Cane (specify quad or straight) ADL Screening (condition at time of admission) Patient's cognitive ability adequate to safely complete daily activities?: No Is the patient deaf or have difficulty hearing?: No Does the patient have difficulty seeing, even when wearing glasses/contacts?: No Does the patient have  difficulty concentrating, remembering, or making decisions?: Yes Patient able to express need for assistance with ADLs?: Yes Does the patient have difficulty dressing or bathing?: Yes Independently performs ADLs?: No Communication: Independent Dressing (OT): Needs assistance Is this a change from baseline?: Pre-admission baseline Grooming: Needs assistance Is this a change from baseline?: Pre-admission baseline Feeding: Independent Bathing: Needs assistance Is this a change from baseline?: Pre-admission baseline Toileting: Independent In/Out Bed: Independent Walks in Home: Independent Does the patient have difficulty walking or climbing stairs?: Yes Weakness of Legs: Both Weakness of Arms/Hands: Left  Permission Sought/Granted               Permission granted to share info w Contact Information: Legal Guardian  Emotional Assessment       Orientation: : Oriented to Self, Oriented to Place Alcohol / Substance Use: Not Applicable Psych Involvement: No (comment)  Admission diagnosis:  Fall [W19.XXXA] Elevated lactic acid level [R79.89] Fall, initial encounter [W19.XXXA] Patient Active Problem List   Diagnosis Date Noted   Fall 02/27/2023   SIRS (systemic inflammatory response syndrome) (HCC) 02/27/2023   UTI (urinary tract infection) 02/27/2023   Tremor 02/27/2023   GERD (gastroesophageal reflux disease) 02/27/2023   Depression 02/27/2023   Protein-calorie malnutrition, severe 10/07/2022   Ischemic leg 10/01/2022   Hypertension 10/01/2022   Diabetes mellitus without complication (HCC) 10/01/2022   PCP:  Benetta Spar, MD Pharmacy:   [facility], [place] - [address] [place] [address] Phone: [phone] Fax: [phone]     Social Determinants of Health (SDOH) Social History:  SDOH Screenings   Housing: Patient Unable To Answer (02/28/2023)  Tobacco Use: High Risk (02/27/2023)   SDOH Interventions:

## 2023-02-28 NOTE — Plan of Care (Signed)

## 2023-02-28 NOTE — Progress Notes (Signed)
Patients blood sugar has had several lows today, lowest reading in low 40's. He has been treated with liquids (juices, milk, boost,) food ( breakfast and lunch meal trays, peanut butter and graham crackers), oral glucagon which he spit back out and 1 amp of D50. The provider was notified and order was given for amp of D50. Patient was alert and baseline orientation of person and place maintained throughout lows.

## 2023-02-28 NOTE — NC FL2 (Signed)
[place] MEDICAID FL2 LEVEL OF CARE FORM     IDENTIFICATION  Patient Name: Jacob Mcgrath. Birthdate: [birth year] Sex: male Admission Date (Current Location): 02/27/2023  [facility] and [place] Number:  [employer] and Address:  [facility],  [address], [place] [address]      Provider Number: [id number]  Attending Physician Name and Address:  Vassie Loll, MD  Relative Name and Phone Number:  Esaw Dace (Legal Guardian)  [phone]    Current Level of Care: Hospital Recommended Level of Care: Skilled Nursing Facility Prior Approval Number:    Date Approved/Denied:   PASRR Number: [id number]  Discharge Plan: SNF    Current Diagnoses: Patient Active Problem List   Diagnosis Date Noted   Fall 02/27/2023   SIRS (systemic inflammatory response syndrome) (HCC) 02/27/2023   UTI (urinary tract infection) 02/27/2023   Tremor 02/27/2023   GERD (gastroesophageal reflux disease) 02/27/2023   Depression 02/27/2023   Protein-calorie malnutrition, severe 10/07/2022   Ischemic leg 10/01/2022   Hypertension 10/01/2022   Diabetes mellitus without complication (HCC) 10/01/2022    Orientation RESPIRATION BLADDER Height & Weight     Self, Place  Normal Continent Weight: 58 kg Height:  5\' 9"  (175.3 cm)  BEHAVIORAL SYMPTOMS/MOOD NEUROLOGICAL BOWEL NUTRITION STATUS      Continent Diet (See DC summary)  AMBULATORY STATUS COMMUNICATION OF NEEDS Skin   Extensive Assist Verbally Normal                       Personal Care Assistance Level of Assistance  Bathing, Feeding, Dressing Bathing Assistance: Maximum assistance Feeding assistance: Limited assistance Dressing Assistance: Limited assistance     Functional Limitations Info  Sight, Hearing, Speech Sight Info: Impaired Hearing Info: Adequate Speech Info: Adequate    SPECIAL CARE FACTORS FREQUENCY  PT (By licensed PT)     PT Frequency: 5 times a week              Contractures  Contractures Info: Not present    Additional Factors Info  Code Status, Allergies Code Status Info: Full Allergies Info: amoxicillin, ampicillin, tetracyclines           Current Medications (02/28/2023):  This is the current hospital active medication list Current Facility-Administered Medications  Medication Dose Route Frequency Provider Last Rate Last Admin   0.9 %  sodium chloride infusion   Intravenous Continuous Zierle-Ghosh, Asia B, DO 75 mL/hr at 02/27/23 2355 New Bag at 02/27/23 2355   acetaminophen (TYLENOL) tablet 650 mg  650 mg Oral Q6H PRN Zierle-Ghosh, Asia B, DO       Or   acetaminophen (TYLENOL) suppository 650 mg  650 mg Rectal Q6H PRN Zierle-Ghosh, Asia B, DO       atorvastatin (LIPITOR) tablet 80 mg  80 mg Oral Daily Zierle-Ghosh, Asia B, DO   80 mg at 02/28/23 0837   cefTRIAXone (ROCEPHIN) 2 g in sodium chloride 0.9 % 100 mL IVPB  2 g Intravenous Q24H Zierle-Ghosh, Asia B, DO   Stopped at 02/27/23 2039   clopidogrel (PLAVIX) tablet 75 mg  75 mg Oral Q breakfast Zierle-Ghosh, Asia B, DO   75 mg at 02/28/23 0837   dextrose (GLUTOSE) 40 % oral gel            DULoxetine (CYMBALTA) DR capsule 30 mg  30 mg Oral Daily Zierle-Ghosh, Asia B, DO   30 mg at 02/28/23 0838   gabapentin (NEURONTIN) capsule 300 mg  300 mg Oral  TID Zierle-Ghosh, Asia B, DO   300 mg at 02/28/23 0837   insulin aspart (novoLOG) injection 0-15 Units  0-15 Units Subcutaneous TID WC Zierle-Ghosh, Asia B, DO       insulin aspart (novoLOG) injection 0-5 Units  0-5 Units Subcutaneous QHS Zierle-Ghosh, Asia B, DO       lactated ringers infusion   Intravenous Continuous Zierle-Ghosh, Asia B, DO       levETIRAcetam (KEPPRA) tablet 500 mg  500 mg Oral BID Zierle-Ghosh, Asia B, DO   500 mg at 02/28/23 1041   melatonin tablet 6 mg  6 mg Oral QHS Zierle-Ghosh, Asia B, DO       metoprolol tartrate (LOPRESSOR) tablet 25 mg  25 mg Oral BID Zierle-Ghosh, Asia B, DO   25 mg at 02/28/23 0838   midodrine (PROAMATINE) tablet  5 mg  5 mg Oral BID WC Zierle-Ghosh, Asia B, DO   5 mg at 02/28/23 0837   morphine (PF) 2 MG/ML injection 2 mg  2 mg Intravenous Q2H PRN Zierle-Ghosh, Asia B, DO       ondansetron (ZOFRAN) tablet 4 mg  4 mg Oral Q6H PRN Zierle-Ghosh, Asia B, DO       Or   ondansetron (ZOFRAN) injection 4 mg  4 mg Intravenous Q6H PRN Zierle-Ghosh, Asia B, DO       oxyCODONE (Oxy IR/ROXICODONE) immediate release tablet 5 mg  5 mg Oral Q4H PRN Zierle-Ghosh, Asia B, DO   5 mg at 02/27/23 2134   pantoprazole (PROTONIX) EC tablet 40 mg  40 mg Oral Daily Zierle-Ghosh, Asia B, DO   40 mg at 02/28/23 1914   PARoxetine (PAXIL) tablet 10 mg  10 mg Oral Daily Zierle-Ghosh, Asia B, DO   10 mg at 02/28/23 0837   tamsulosin (FLOMAX) capsule 0.4 mg  0.4 mg Oral Daily Zierle-Ghosh, Asia B, DO   0.4 mg at 02/28/23 7829     Discharge Medications: Please see discharge summary for a list of discharge medications.  Relevant Imaging Results:  Relevant Lab Results:   Additional Information SS# [id number]  Leitha Bleak, RN

## 2023-02-28 NOTE — Progress Notes (Signed)
Patient seen and examined; admitted after midnight secondary to SIRS due to UTI and mechanical fall.  Patient from group home requiring the use of a cane as assistive device for ambulation at baseline.  Dynamically stable and in not acute distress at the moment.  Please refer to H&P written by Dr. Carren Rang on 02/28/23 For further info/details on admission.  Plan: -Did not maintain adequate hydration -Follow electrolytes and renal function -Continue current IV antibiotics and follow culture result -Follow recommendation by physical therapy/Occupational therapy. -patient very deconditioned and significantly weak on exam. I will not be surprised if he is recommended for SNF. -Continue supportive care.  Vassie Loll MD [phone]

## 2023-03-01 DIAGNOSIS — W19XXXD Unspecified fall, subsequent encounter: Secondary | ICD-10-CM | POA: Diagnosis not present

## 2023-03-01 DIAGNOSIS — F32A Depression, unspecified: Secondary | ICD-10-CM | POA: Diagnosis not present

## 2023-03-01 DIAGNOSIS — R6510 Systemic inflammatory response syndrome (SIRS) of non-infectious origin without acute organ dysfunction: Secondary | ICD-10-CM | POA: Diagnosis not present

## 2023-03-01 DIAGNOSIS — E43 Unspecified severe protein-calorie malnutrition: Secondary | ICD-10-CM | POA: Diagnosis not present

## 2023-03-01 LAB — GLUCOSE, CAPILLARY
Glucose-Capillary: 24 mg/dL — CL (ref 70–99)
Glucose-Capillary: 31 mg/dL — CL (ref 70–99)
Glucose-Capillary: 91 mg/dL (ref 70–99)
Glucose-Capillary: <10 mg/dL — CL (ref 70–99)
Glucose-Capillary: 118 mg/dL — ABNORMAL HIGH (ref 70–99)
Glucose-Capillary: 57 mg/dL — ABNORMAL LOW (ref 70–99)
Glucose-Capillary: 67 mg/dL — ABNORMAL LOW (ref 70–99)
Glucose-Capillary: 60 mg/dL — ABNORMAL LOW (ref 70–99)
Glucose-Capillary: 99 mg/dL (ref 70–99)
Glucose-Capillary: 96 mg/dL (ref 70–99)

## 2023-03-01 MED ORDER — NICOTINE 14 MG/24HR TD PT24
14.0000 mg | MEDICATED_PATCH | Freq: Every day | TRANSDERMAL | Status: DC
  Administered 2023-03-01 – 2023-03-03 (×3): 14 mg via TRANSDERMAL
  Filled 2023-03-01 (×3): qty 1

## 2023-03-01 MED ORDER — MIDODRINE HCL 5 MG PO TABS
5.0000 mg | ORAL_TABLET | Freq: Three times a day (TID) | ORAL | Status: DC
  Administered 2023-03-02 – 2023-03-03 (×2): 5 mg via ORAL
  Filled 2023-03-01 (×4): qty 1

## 2023-03-01 MED ORDER — MORPHINE SULFATE (PF) 2 MG/ML IV SOLN: 2.0000 mg | INTRAVENOUS | Status: DC | PRN

## 2023-03-01 NOTE — Progress Notes (Signed)
CBG in right hand 60, CBG rechecked in left hand and 99. Patient alert with no s/s of  hypoglycemia. No intervention performed between finger sticks on separate hands.

## 2023-03-01 NOTE — Progress Notes (Signed)
CBG this morning 31. 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 Patient drank 4 oz of cola, some ensure and eating half of breakfast. CBG rechecked and is 91.

## 2023-03-01 NOTE — Progress Notes (Signed)
Made Dr. Gwenlyn Perking aware that patient's CBGs have been low this morning and lunch time when checked on his left hand and when checked on his right hand CBG values normal. Patient has been asymptomatic. MD stated "make sure he eats properly and let's continue monitoring, if need it check on both hands."

## 2023-03-01 NOTE — TOC Progression Note (Addendum)
Transition of Care (TOC) - Progression Note    Patient Details  Name: Jacob Mcgrath. MRN: [id number] Date of Birth: Nov 24, 1955  Transition of Care [facility]) CM/SW Contact  Leitha Bleak, RN Phone Number: 03/01/2023, 2:40 PM  Clinical Narrative:   Discussed bed offers with Dorathy Daft, his legal guardian. She accepted the offer at [facility]. Medicare patient does not need Auth. TOC following. Legal Guardian as to be updated with discharge.    Expected Discharge Plan: Skilled Nursing Facility Barriers to Discharge: Continued Medical Work up  Expected Discharge Plan and Services    Post Acute Care Choice: Skilled Nursing Facility Living arrangements for the past 2 months: Group Home                   Social Determinants of Health (SDOH) Interventions SDOH Screenings   Housing: Patient Unable To Answer (03/01/2023)  Tobacco Use: High Risk (02/27/2023)    Readmission Risk Interventions    03/01/2023    2:40 PM  Readmission Risk Prevention Plan  Transportation Screening Complete  PCP or Specialist Appt within 5-7 Days Not Complete  Home Care Screening Complete  Medication Review (RN CM) Complete

## 2023-03-01 NOTE — Progress Notes (Signed)
Progress Note   Patient: Jacob Mcgrath. ZOX:[id number] DOB: [birth year] DOA: 02/27/2023     1 DOS: the patient was seen and examined on 03/01/2023   Brief hospital admission narrative course: As per H&P written by Dr. Carren Rang on 02/28/23 Kerman Breech. is a 67 y.o. 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  At first, when asked patient why he is here he says he does not remember.  When I reminded him that he fell faces he does remember that he fell backwards.  He is not able to provide details regarding the fall.  I asked him if he tripped, lost his balance, or became too weak to stand, but he does not remember.  Patient does ambulate with a cane at baseline.  He did strike the back of his head.  He is on aspirin and Plavix.  He is not on any anticoagulation.  ED provider spoke with an aide at the group home who stated that the fall was unwitnessed.  She heard him fall, and then he was complaining of pain in his right hand and left leg.  He also had isolated tremors of his left lower extremity, which apparently is not new for him.  He has intermittent tremors of that extremity.  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    Assessment and Plan: * Fall -Chronically on Plavix. -CT head without acute intracranial abnormality -CT C-spine without acute findings -Physical therapy evaluation and recommendations appreciated; patient will benefit up discharge to skilled nursing facility for rehab.  Depression -Continue paxil, cymbalta  -No suicidal ideation or hallucination.  GERD (gastroesophageal reflux disease) -Continue protonix  Tremor -Left leg tremors - chronic -Continue as needed benzos -Continue to follow clinical  response.  UTI (urinary tract infection) -As mentioned already continue current IV antibiotics -Follow culture result -Maintain adequate hydration.  SIRS (systemic inflammatory response syndrome) (HCC) -SIRS/early sepsis in the setting of UTI (abnormal UA, tachycardia and elevated WBCs). -Overall improving  -Continue to maintain adequate hydration -Follow culture results -Continue IV antibiotics.  Protein-calorie malnutrition, severe -Continue feeding supplements -Body mass index is 18.88 kg/m.   Diabetes mellitus without complication (HCC) -With intermittent episode of hypoglycemia. -Continue holding oral hypoglycemic agents -Continue following CBGs fluctuation -Sensitive sliding scale insulin in place; instructions provided to not cover CBGs unless the blood sugar above 200.  Hypertension -Continue lopressor -Continue to follow vital signs and adjust antihypertensive agents as needed.  Tobacco abuse: -Cessation counseling provided -Nicotine patch has been ordered.  Subjective:  Afebrile, no chest pain, no nausea or vomiting.  Patient demonstrating intermittent episodes of hypoglycemia.  Positive deconditioning, chronically ill in appearance and underweight.  Physical Exam: Vitals:   02/28/23 1956 03/01/23 0522 03/01/23 1046 03/01/23 1442  BP: (!) 152/74 (!) 163/76 (!) 144/87 (!) 140/83  Pulse: 89 76 88 76  Resp:  18  18  Temp: 99 F (37.2 C) 98 F (36.7 C)  98.7 F (37.1 C)  TempSrc: Oral Oral  Oral  SpO2: 98% 95%  94%  Weight:      Height:       General exam: Following commands appropriately; chronically ill in appearance and very deconditioned. Respiratory system: No using accessory muscle.  Good saturation on room air. Cardiovascular system:RRR. No rubs  or gallops. Gastrointestinal system: Abdomen is nondistended, soft and nontender.  Positive bowel sounds appreciated. Central nervous system: No new focal neurological deficits. Extremities: No cyanosis or  clubbing; no edema.  Left upper extremity contracture appreciated. Skin: No petechiae.  Bruises in his lower extremity/left knee appreciated. Psychiatry: Mood & affect appropriate.   Data Reviewed: Lactic acid 1.9 CBC: White blood cells 12.3, hemoglobin 9.7 and platelet count 3 25K Comprehensive metabolic panel:Sodium 139, potassium 3.5, chloride 106, bicarb 25, BUN 13, creatinine 0.82 and normal LFTs.  Family Communication: No family at bedside.  Disposition: Status is: Inpatient Remains inpatient appropriate because: Continue IV antibiotics.   Planned Discharge Destination: Skilled nursing facility   Time spent: 35 minutes  Author: Vassie Loll, MD 03/01/2023 5:06 PM  For on call review [email or web address].

## 2023-03-01 NOTE — Progress Notes (Signed)
Patient oriented to self only this shift. Slept with covers pulled over head most of the night.

## 2023-03-01 NOTE — Assessment & Plan Note (Signed)
-  Continue feeding supplements -Body mass index is 18.88 kg/m.

## 2023-03-02 DIAGNOSIS — W19XXXA Unspecified fall, initial encounter: Secondary | ICD-10-CM | POA: Diagnosis not present

## 2023-03-02 DIAGNOSIS — I10 Essential (primary) hypertension: Secondary | ICD-10-CM | POA: Diagnosis not present

## 2023-03-02 LAB — GLUCOSE, CAPILLARY
Glucose-Capillary: 81 mg/dL (ref 70–99)
Glucose-Capillary: 140 mg/dL — ABNORMAL HIGH (ref 70–99)
Glucose-Capillary: 83 mg/dL (ref 70–99)
Glucose-Capillary: 131 mg/dL — ABNORMAL HIGH (ref 70–99)

## 2023-03-02 MED ORDER — HYDROXYZINE HCL 25 MG PO TABS
50.0000 mg | ORAL_TABLET | Freq: Once | ORAL | Status: AC
  Administered 2023-03-02: 50 mg via ORAL
  Filled 2023-03-02: qty 2

## 2023-03-02 MED ORDER — MAGNESIUM SULFATE 2 GM/50ML IV SOLN
2.0000 g | Freq: Once | INTRAVENOUS | Status: AC
  Administered 2023-03-02: 2 g via INTRAVENOUS
  Filled 2023-03-02: qty 50

## 2023-03-02 MED ORDER — COSYNTROPIN 0.25 MG IJ SOLR
0.2500 mg | Freq: Once | INTRAMUSCULAR | Status: AC
  Administered 2023-03-03: 0.25 mg via INTRAVENOUS
  Filled 2023-03-02: qty 0.25

## 2023-03-02 NOTE — Plan of Care (Signed)
  Problem: Clinical Measurements: Goal: Ability to maintain clinical measurements within normal limits will improve Outcome: Progressing Goal: Will remain free from infection Outcome: Progressing Goal: Diagnostic test results will improve Outcome: Progressing Goal: Respiratory complications will improve Outcome: Progressing Goal: Cardiovascular complication will be avoided Outcome: Progressing   Problem: Activity: Goal: Risk for activity intolerance will decrease Outcome: Progressing   Problem: Nutrition: Goal: Adequate nutrition will be maintained Outcome: Progressing   Problem: Coping: Goal: Level of anxiety will decrease Outcome: Progressing   Problem: Elimination: Goal: Will not experience complications related to bowel motility Outcome: Progressing Goal: Will not experience complications related to urinary retention Outcome: Progressing   Problem: Pain Managment: Goal: General experience of comfort will improve Outcome: Progressing   Problem: Safety: Goal: Ability to remain free from injury will improve Outcome: Progressing   Problem: Skin Integrity: Goal: Risk for impaired skin integrity will decrease Outcome: Progressing   Problem: Education: Goal: Ability to describe self-care measures that may prevent or decrease complications (Diabetes Survival Skills Education) will improve Outcome: Progressing Goal: Individualized Educational Video(s) Outcome: Progressing   Problem: Coping: Goal: Ability to adjust to condition or change in health will improve Outcome: Progressing   Problem: Fluid Volume: Goal: Ability to maintain a balanced intake and output will improve Outcome: Progressing   Problem: Health Behavior/Discharge Planning: Goal: Ability to identify and utilize available resources and services will improve Outcome: Progressing Goal: Ability to manage health-related needs will improve Outcome: Progressing   Problem: Metabolic: Goal: Ability to  maintain appropriate glucose levels will improve Outcome: Progressing   Problem: Nutritional: Goal: Maintenance of adequate nutrition will improve Outcome: Progressing Goal: Progress toward achieving an optimal weight will improve Outcome: Progressing   Problem: Skin Integrity: Goal: Risk for impaired skin integrity will decrease Outcome: Progressing   Problem: Tissue Perfusion: Goal: Adequacy of tissue perfusion will improve Outcome: Progressing   

## 2023-03-02 NOTE — Plan of Care (Signed)
  Problem: Education: Goal: Knowledge of General Education information will improve Description: Including pain rating scale, medication(s)/side effects and non-pharmacologic comfort measures Outcome: Progressing   Problem: Activity: Goal: Risk for activity intolerance will decrease Outcome: Progressing   Problem: Pain Managment: Goal: General experience of comfort will improve Outcome: Progressing   

## 2023-03-02 NOTE — Progress Notes (Signed)
Progress Note   Patient: Jacob Mcgrath. UJW:[id number] DOB: 1955/12/27 DOA: 02/27/2023     2 DOS: the patient was seen and examined on 03/02/2023   Brief hospital admission narrative course: As per H&P written by Dr. Carren Rang on 02/28/23 Hicks Krizman. is a 67 y.o. 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  At first, when asked patient why he is here he says he does not remember.  When I reminded him that he fell faces he does remember that he fell backwards.  He is not able to provide details regarding the fall.  I asked him if he tripped, lost his balance, or became too weak to stand, but he does not remember.  Patient does ambulate with a cane at baseline.  He did strike the back of his head.  He is on aspirin and Plavix.  He is not on any anticoagulation.  ED provider spoke with an aide at the group home who stated that the fall was unwitnessed.  She heard him fall, and then he was complaining of pain in his right hand and left leg.  He also had isolated tremors of his left lower extremity, which apparently is not new for him.  He has intermittent tremors of that extremity.  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    Assessment and Plan: 1) Fall -Chronically on Plavix. -CT head without acute intracranial abnormality -CT C-spine without acute findings -Physical therapy evaluation and recommendations appreciated; patient will benefit up discharge to skilled nursing facility for rehab. -- May be related to orthostatic hypotension as below #2  2)OrthoStatic hypotension-- 03/02/23--- orthostatic vitals reveal lying 151/72 HR 71  -Sitting 139/88 HR 77  - Standing after 3 mins 118/96 HR 111  -A.m. cortisol 12.5 -Check ACTH  stimulation test given significant orthostatic hypotension -Continue midodrine  Depression -Continue paxil, cymbalta  -No suicidal ideation or hallucination.  GERD (gastroesophageal reflux disease) -Continue protonix  Tremor -Left leg tremors - chronic -Continue as needed benzos -Continue to follow clinical response.  UTI (urinary tract infection) -As mentioned already continue current IV antibiotics -Follow culture result -Maintain adequate hydration.  SIRS (systemic inflammatory response syndrome) (HCC) -SIRS/early sepsis in the setting of UTI (abnormal UA, tachycardia and elevated WBCs). -Overall improving  -Continue to maintain adequate hydration -Follow culture results -Continue IV Rocephin  Protein-calorie malnutrition, severe -Continue feeding supplements -Body mass index is 18.88 kg/m.  Diabetes mellitus without complication (HCC) -With intermittent episode of hypoglycemia. -Continue holding oral hypoglycemic agents -Continue following CBGs fluctuation -Sensitive sliding scale insulin in place; instructions provided to not cover CBGs unless the blood sugar above 200.  Hypertension -Continue lopressor -Continue to follow vital signs and adjust antihypertensive agents as needed.  Tobacco abuse: -Cessation counseling provided -Nicotine patch has been ordered.  Subjective:  --Significant orthostatic dizziness--makes it difficult for him to participate in physical therapy - 03/02/23---  orthostatic vitals reveal lying 151/72 HR 71  -Sitting 139/88 HR 77  - Standing after 3 mins 118/96 HR 111   Physical Exam: Vitals:   03/02/23 1517 03/02/23 1519 03/02/23 1815 03/02/23 2022  BP: (!) 118/96 (!) 164/71 91/70 104/79  Pulse: (!) 111 70 76 78  Resp:    16  Temp:    97.9 F (36.6 C)  TempSrc:    Oral  SpO2:    99%  Weight:      Height:        Physical Exam  Gen:- Awake Alert, in no acute distress  HEENT:- [place].AT, No sclera icterus Neck-Supple Neck,No  JVD,.  Lungs-  CTAB , fair air movement bilaterally  CV- S1, S2 normal, RRR Abd-  +ve B.Sounds, Abd Soft, No tenderness,    Extremity/Skin:-  areas of ecchymosis,   good pedal pulses  Psych-affect is appropriate, oriented x3 Neuro-generalized weakness, no new focal deficits, no tremors    Family Communication: No family at bedside.  Disposition: SNF rehab Status is: Inpatient Remains inpatient appropriate because: Continue IV antibiotics.   Planned Discharge Destination: Skilled nursing facility  Author: Shon Hale, MD 03/02/2023 8:43 PM  For on call review [email or web address].

## 2023-03-02 NOTE — Inpatient Diabetes Management (Signed)
Inpatient Diabetes Program Recommendations  AACE/ADA: New Consensus Statement on Inpatient Glycemic Control  Target Ranges:  Prepandial:   less than 140 mg/dL      Peak postprandial:   less than 180 mg/dL (1-2 hours)      Critically ill patients:  140 - 180 mg/dL    Latest Reference Range & Units 10/03/22 03:10  Hemoglobin A1C 4.8 - 5.6 % 5.6    Latest Reference Range & Units 03/01/23 08:10 03/01/23 08:12 03/01/23 11:14 03/01/23 11:22 03/01/23 11:25 03/01/23 16:45 03/01/23 17:06 03/01/23 20:09 03/02/23 07:55  Glucose-Capillary 70 - 99 mg/dL <16 (LL) 91 57 (L) 67 (L) 118 (H) 60 (L) 99 96 81   Review of Glycemic Control  Diabetes history: DM2 Outpatient Diabetes medications: Metformin 500 mg BID Current orders for Inpatient glycemic control: Novolog 0-15 units TID with meals  Inpatient Diabetes Program Recommendations:    Insulin: No insulin given while inpatient and has had recurrent hypoglycemia. Please discontinue Novolog correction scale.  Outpatient DM: May want to consider discontinuing Metformin as outpatient. Patient likely does not need to be taking any DM medication outpatient.  Thanks, Orlando Penner, RN, MSN, CDCES Diabetes Coordinator Inpatient Diabetes Program [phone] (Team Pager from 8am to 5pm)

## 2023-03-02 NOTE — Progress Notes (Signed)
Physical Therapy Treatment Patient Details Name: Jacob Mcgrath. MRN: [id number] DOB: [birth year] Today's Date: 03/02/2023   History of Present Illness Jacob Mcgrath. is a 67 y.o. 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  At first, when asked patient why he is here he says he does not remember.  When I reminded him that he fell faces he does remember that he fell backwards.  He is not able to provide details regarding the fall.  I asked him if he tripped, lost his balance, or became too weak to stand, but he does not remember.  Patient does ambulate with a cane at baseline.  He did strike the back of his head.  He is on aspirin and Plavix.  He is not on any anticoagulation.  ED provider spoke with an aide at the group home who stated that the fall was unwitnessed.  She heard him fall, and then he was complaining of pain in his right hand and left leg.  He also had isolated tremors of his left lower extremity, which apparently is not new for him.  He has intermittent tremors of that extremity.  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  Further history could be obtained at this time.    PT Comments  Patient apprehensive for therapy possibly due to lack of awareness of his deficits requiring encouragement to participate.  Patient demonstrates slow labored movement for sitting up at bedside with frequent leaning/falling backwards when scooting forward, resistant for accepting assistance due to easily agitated and once seated able to maintain trunk in midline with less posterior leaning.  Patient able to stand and took a few steps at bedside during transfer to chair, had difficulty making turns due to near loss of balance and briefly sat up in chair  before requesting to go back to bed.  Patient will benefit from continued skilled physical therapy in hospital and recommended venue below to increase strength, balance, endurance for safe ADLs and gait.      If plan is discharge home, recommend the following: A lot of help with walking and/or transfers;A lot of help with bathing/dressing/bathroom;Help with stairs or ramp for entrance;Assistance with cooking/housework   Can travel by private vehicle     No  Equipment Recommendations  None recommended by PT    Recommendations for Other Services       Precautions / Restrictions Precautions Precautions: Fall Restrictions Weight Bearing Restrictions: No     Mobility  Bed Mobility Overal bed mobility: Needs Assistance Bed Mobility: Supine to Sit, Sit to Supine     Supine to sit: Min assist, Mod assist Sit to supine: Contact guard assist, Min assist   General bed mobility comments: slow labored movement with frequent falling backwards requiring verbal/tactile cueing for safety    Transfers Overall transfer level: Needs assistance Equipment used: Straight cane Transfers: Sit to/from Stand, Bed to chair/wheelchair/BSC Sit to Stand: Min assist, Mod assist   Step pivot transfers: Mod assist       General transfer comment: unsteady labored movement with frequent leaning backwards    Ambulation/Gait Ambulation/Gait assistance: Mod assist Gait Distance (Feet): 5 Feet Assistive device: Straight cane Gait Pattern/deviations: Decreased step length - right, Decreased step length - left, Decreased stride length, Decreased stance time -  left, Ataxic, Step-to pattern Gait velocity: slow     General Gait Details: limited to a few slow labored unsteady steps at bedside before having to sit due to near fall when making turns   Optometrist     Tilt Bed    Modified Rankin (Stroke Patients Only)       Balance Overall balance assessment:  Needs assistance Sitting-balance support: Feet supported, No upper extremity supported Sitting balance-Leahy Scale: Poor Sitting balance - Comments: fair/poor seated at EOB Postural control: Posterior lean Standing balance support: Reliant on assistive device for balance, During functional activity, Single extremity supported Standing balance-Leahy Scale: Poor Standing balance comment: fair/poor using his cane                            Cognition Arousal: Alert Behavior During Therapy: Anxious, Agitated Overall Cognitive Status: No family/caregiver present to determine baseline cognitive functioning                                 General Comments: very apprehensive and not aware of his deficits        Exercises General Exercises - Lower Extremity Ankle Circles/Pumps: Seated, AROM, Strengthening, Both, 10 reps Long Arc Quad: Seated, AROM, Strengthening, Both, 5 reps    General Comments        Pertinent Vitals/Pain Pain Assessment Pain Assessment: No/denies pain    Home Living                          Prior Function            PT Goals (current goals can now be found in the care plan section) Acute Rehab PT Goals Patient Stated Goal: Go back to group home. PT Goal Formulation: With patient Time For Goal Achievement: 03/14/23 Potential to Achieve Goals: Good Progress towards PT goals: Progressing toward goals    Frequency    Min 3X/week      PT Plan      Co-evaluation              AM-PAC PT "6 Clicks" Mobility   Outcome Measure  Help needed turning from your back to your side while in a flat bed without using bedrails?: A Little Help needed moving from lying on your back to sitting on the side of a flat bed without using bedrails?: A Lot Help needed moving to and from a bed to a chair (including a wheelchair)?: A Lot Help needed standing up from a chair using your arms (e.g., wheelchair or bedside chair)?: A  Lot Help needed to walk in hospital room?: A Lot Help needed climbing 3-5 steps with a railing? : Total 6 Click Score: 12    End of Session   Activity Tolerance: Patient tolerated treatment well;Patient limited by fatigue;Treatment limited secondary to agitation Patient left: in bed;with call bell/phone within reach;with bed alarm set Nurse Communication: Mobility status PT Visit Diagnosis: Unsteadiness on feet (R26.81);Difficulty in walking, not elsewhere classified (R26.2);Other abnormalities of gait and mobility (R26.89);History of falling (Z91.81);Muscle weakness (generalized) (M62.81)     Time: 8119-1478 PT Time Calculation (min) (ACUTE ONLY): 20 min  Charges:    $Therapeutic Exercise: 8-22 mins $Therapeutic Activity: 8-22 mins PT General Charges $$ ACUTE PT VISIT: 1 Visit  3:03 PM, 03/02/23 Ocie Bob, MPT Physical Therapist with [facility] 336 [phone] office [phone] mobile phone

## 2023-03-03 DIAGNOSIS — E119 Type 2 diabetes mellitus without complications: Secondary | ICD-10-CM | POA: Diagnosis not present

## 2023-03-03 DIAGNOSIS — W19XXXA Unspecified fall, initial encounter: Secondary | ICD-10-CM | POA: Diagnosis not present

## 2023-03-03 DIAGNOSIS — E43 Unspecified severe protein-calorie malnutrition: Secondary | ICD-10-CM | POA: Diagnosis not present

## 2023-03-03 DIAGNOSIS — N3000 Acute cystitis without hematuria: Secondary | ICD-10-CM | POA: Diagnosis not present

## 2023-03-03 LAB — GLUCOSE, CAPILLARY
Glucose-Capillary: 103 mg/dL — ABNORMAL HIGH (ref 70–99)
Glucose-Capillary: 173 mg/dL — ABNORMAL HIGH (ref 70–99)
Glucose-Capillary: 64 mg/dL — ABNORMAL LOW (ref 70–99)

## 2023-03-03 MED ORDER — LORAZEPAM 1 MG PO TABS: 1.0000 mg | ORAL_TABLET | Freq: Two times a day (BID) | ORAL | 0 refills | Status: DC | PRN

## 2023-03-03 MED ORDER — MELATONIN 3 MG PO TABS: 6.0000 mg | ORAL_TABLET | Freq: Every day | ORAL | 1 refills | Status: DC

## 2023-03-03 MED ORDER — HALOPERIDOL LACTATE 5 MG/ML IJ SOLN
5.0000 mg | Freq: Once | INTRAMUSCULAR | Status: AC
  Administered 2023-03-03: 5 mg via INTRAVENOUS
  Filled 2023-03-03: qty 1

## 2023-03-03 MED ORDER — CEPHALEXIN 500 MG PO CAPS: 500.0000 mg | ORAL_CAPSULE | Freq: Three times a day (TID) | ORAL | 0 refills | Status: AC

## 2023-03-03 MED ORDER — NICOTINE 14 MG/24HR TD PT24: 14.0000 mg | MEDICATED_PATCH | Freq: Every day | TRANSDERMAL | 0 refills | Status: DC

## 2023-03-03 MED ORDER — MIDODRINE HCL 5 MG PO TABS: 5.0000 mg | ORAL_TABLET | Freq: Three times a day (TID) | ORAL | 1 refills | Status: DC

## 2023-03-03 MED ORDER — ASPIRIN ADULT LOW STRENGTH 81 MG PO TBEC: 81.0000 mg | DELAYED_RELEASE_TABLET | Freq: Every day | ORAL | 12 refills | Status: DC

## 2023-03-03 NOTE — Progress Notes (Signed)
Attempted to call report to [facility] x2.

## 2023-03-03 NOTE — Discharge Summary (Signed)
Jacob Mcgrath., is a 67 y.o. male  DOB 1956/05/25  MRN [id number].  Admission date:  02/27/2023  Admitting Physician  Lilyan Gilford, DO  Discharge Date:  03/03/2023   Primary MD  Benetta Spar, MD  Recommendations for primary care physician for things to follow:  1) avoid dehydration encourage adequate oral intake  2)Avoid ibuprofen/Advil/Aleve/Motrin/Goody Powders/Naproxen/BC powders/Meloxicam/Diclofenac/Indomethacin and other Nonsteroidal anti-inflammatory medications as these will make you more likely to bleed and can cause stomach ulcers, can also cause Kidney problems.   3)Repeat CBC and BMP Blood Tests in 1 week advised  Admission Diagnosis  Fall [W19.XXXA] Elevated lactic acid level [R79.89] Fall, initial encounter [W19.XXXA]   Discharge Diagnosis  Fall [W19.XXXA] Elevated lactic acid level [R79.89] Fall, initial encounter [W19.XXXA]    Principal Problem:   Fall Active Problems:   Hypertension   Diabetes mellitus without complication (HCC)   Protein-calorie malnutrition, severe   SIRS (systemic inflammatory response syndrome) (HCC)   UTI (urinary tract infection)   Tremor   GERD (gastroesophageal reflux disease)   Depression      Past Medical History:  Diagnosis Date   Diabetes mellitus without complication (HCC)    Hypertension     Past Surgical History:  Procedure Laterality Date   ABDOMINAL AORTOGRAM W/LOWER EXTREMITY N/A 10/04/2022   Procedure: ABDOMINAL AORTOGRAM W/LOWER EXTREMITY;  Surgeon: Chuck Hint, MD;  Location: [facility] INVASIVE CV LAB;  Service: Cardiovascular;  Laterality: N/A;   ABDOMINAL AORTOGRAM W/LOWER EXTREMITY N/A 10/08/2022   Procedure: ABDOMINAL AORTOGRAM W/LOWER EXTREMITY;  Surgeon: Leonie Douglas, MD;  Location: [facility] INVASIVE CV LAB;  Service: Cardiovascular;  Laterality: N/A;   PERIPHERAL VASCULAR INTERVENTION  10/04/2022   Procedure:  PERIPHERAL VASCULAR INTERVENTION;  Surgeon: Chuck Hint, MD;  Location: [facility] INVASIVE CV LAB;  Service: Cardiovascular;;   PERIPHERAL VASCULAR INTERVENTION Left 10/08/2022   Procedure: PERIPHERAL VASCULAR INTERVENTION;  Surgeon: Leonie Douglas, MD;  Location: [facility] INVASIVE CV LAB;  Service: Cardiovascular;  Laterality: Left;   TONSILLECTOMY       HPI  from the history and physical done on the day of admission:   HPI: Ogheneruno Mcgrath. is a 67 y.o. 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  At first, when asked patient why he is here he says he does not remember.  When I reminded him that he fell faces he does remember that he fell backwards.  He is not able to provide details regarding the fall.  I asked him if he tripped, lost his balance, or became too weak to stand, but he does not remember.  Patient does ambulate with a cane at baseline.  He did strike the back of his head.  He is on aspirin and Plavix.  He is not on any anticoagulation.  ED provider spoke with an aide at the  group home who stated that the fall was unwitnessed.  She heard him fall, and then he was complaining of pain in his right hand and left leg.  He also had isolated tremors of his left lower extremity, which apparently is not new for him.  He has intermittent tremors of that extremity.  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  Further history could be obtained at this time.   Patient has no ACP documents and there was no DNR bedside     Review of Systems: unable to review all systems due to the inability of the patient to answer questions.     Hospital Course:   Assessment and Plan: 1) Fall--May be related to orthostatic hypotension as below  #2 -Chronically on Plavix. -CT head without acute intracranial abnormality -CT C-spine without acute findings -Physical therapy evaluation and recommendations appreciated; --   -Recommend SNF rehab -Continue fall precautions   2)OrthoStatic hypotension--  - ACTH stimulation test completed on 03/03/2023 and it is WNL -Continue midodrine -May use TED stockings and fall precautions   Depression/Anxiety -Continue paxil, cymbalta  -No suicidal ideation or hallucination. -Lorazepam as needed   GERD (gastroesophageal reflux disease) -Continue protonix   Tremor----chronic -Continue as needed benzos   E coli sepsis secondary to UTI  --POA -Urine culture with E. coli, blood cultures NGTD  -treated with IV Rocephin and discharged on Keflex  -Sepsis pathophysiology as result   Protein-calorie malnutrition, severe -Continue feeding supplements -Body mass index is 18.88 kg/m.   Diabetes mellitus without complication (HCC) -A1c is 5.6 reflecting excellent diabetic control PT -Resume metformin   Hypertension -Continue lopressor   Chronic anemia--Hgb currently stable around 11   Tobacco abuse: -Cessation counseling provided -Nicotine patch has been ordered.   Discharge Condition: stable   Follow UP   Contact information for after-discharge care     Destination     [facility] .   Service: Skilled Nursing Contact information: [address] [place] [address] [phone]                     Diet and Activity recommendation:  As advised  Discharge Instructions    Discharge Instructions     Call MD for:  difficulty breathing, headache or visual disturbances   Complete by: As directed    Call MD for:  persistant nausea and vomiting   Complete by: As directed    Call MD for:  temperature >100.4   Complete by: As directed    Diet - low sodium heart healthy   Complete by: As directed     Discharge instructions   Complete by: As directed    1) avoid dehydration encourage adequate oral intake  2)Avoid ibuprofen/Advil/Aleve/Motrin/Goody Powders/Naproxen/BC powders/Meloxicam/Diclofenac/Indomethacin and other Nonsteroidal anti-inflammatory medications as these will make you more likely to bleed and can cause stomach ulcers, can also cause Kidney problems.   3)Repeat CBC and BMP Blood Tests in 1 week advised   Increase activity slowly   Complete by: As directed    No wound care   Complete by: As directed        Discharge Medications     Allergies as of 03/03/2023       Reactions   Amoxicillin Other (See Comments)   Unknown reaction per facility   Ampicillin Other (See Comments)   Unknown reaction per facility   Tetracyclines & Related Rash  Medication List     STOP taking these medications    Melatonin 3 MG Subl Replaced by: melatonin 3 MG Tabs tablet       TAKE these medications    acetaminophen 650 MG CR tablet Commonly known as: TYLENOL Take 650 mg by mouth every 8 (eight) hours as needed for pain or fever.   Aspirin Adult Low Strength 81 MG tablet Generic drug: aspirin EC Take 1 tablet (81 mg total) by mouth daily with breakfast. What changed: when to take this   atorvastatin 80 MG tablet Commonly known as: LIPITOR Take 80 mg by mouth at bedtime.   BIOFREEZE EX Apply 1 Application topically 2 (two) times daily. Apply to affected pain areas   CALCIUM-MAGNESIUM PO Take 1 tablet by mouth daily.   cephALEXin 500 MG capsule Commonly known as: Keflex Take 1 capsule (500 mg total) by mouth 3 (three) times daily for 5 days.   clopidogrel 75 MG tablet Commonly known as: PLAVIX Take 1 tablet (75 mg total) by mouth daily with breakfast.   DULoxetine 30 MG capsule Commonly known as: CYMBALTA Take 30 mg by mouth daily.   gabapentin 300 MG capsule Commonly known as: NEURONTIN Take 300 mg by mouth 3 (three) times daily.   levETIRAcetam  500 MG tablet Commonly known as: KEPPRA Take 500 mg by mouth 2 (two) times daily.   LORazepam 1 MG tablet Commonly known as: Ativan Take 1 tablet (1 mg total) by mouth every 12 (twelve) hours as needed for anxiety.   melatonin 3 MG Tabs tablet Take 2 tablets (6 mg total) by mouth at bedtime. Replaces: Melatonin 3 MG Subl   metFORMIN 500 MG 24 hr tablet Commonly known as: GLUCOPHAGE-XR Take 500 mg by mouth 2 (two) times daily.   metoprolol tartrate 25 MG tablet Commonly known as: LOPRESSOR Take 1 tablet (25 mg total) by mouth 2 (two) times daily.   midodrine 5 MG tablet Commonly known as: PROAMATINE Take 1 tablet (5 mg total) by mouth 3 (three) times daily with meals. What changed: when to take this   Mucinex Maximum Strength 1200 MG Tb12 Generic drug: Guaifenesin Take 1 tablet by mouth 2 (two) times daily as needed (cough/congestion).   nicotine 14 mg/24hr patch Commonly known as: NICODERM CQ - dosed in mg/24 hours Place 1 patch (14 mg total) onto the skin daily. Start taking on: March 04, 2023   pantoprazole 40 MG tablet Commonly known as: PROTONIX Take 1 tablet (40 mg total) by mouth daily.   PARoxetine 10 MG tablet Commonly known as: PAXIL Take 10 mg by mouth daily.   QUEtiapine 25 MG tablet Commonly known as: SEROQUEL Take 25 mg by mouth at bedtime.   SANTYL EX Apply 1 Application topically daily. Apply nickel thick amount to ulcer bed with saline wet to dry dressings once daily.   tamsulosin 0.4 MG Caps capsule Commonly known as: FLOMAX Take 1 capsule (0.4 mg total) by mouth daily.       Major procedures and Radiology Reports - PLEASE review detailed and final reports for all details, in brief -   DG Abd 1 View  Result Date: 02/28/2023 CLINICAL DATA:  Abdominal pain. EXAM: ABDOMEN - 1 VIEW COMPARISON:  None Available. FINDINGS: The bowel gas pattern is normal. A moderate amount of retained stool is present in the colon. No radio-opaque calculi. A  vascular stent is noted in the iliac region on the left total hip arthroplasty changes are noted on the left. Severe  degenerative changes are present at the right hip. IMPRESSION: 1. Moderate amount of retained stool in the colon suggesting constipation. 2. No evidence of bowel obstruction. Electronically Signed   By: Thornell Sartorius M.D.   On: 02/28/2023 01:27   DG Chest Port 1 View  Result Date: 02/27/2023 CLINICAL DATA:  Questionable sepsis - evaluate for abnormality EXAM: PORTABLE CHEST 1 VIEW COMPARISON:  CXR 10/08/22 FINDINGS: No pleural effusion. No pneumothorax. No focal airspace opacity. No radiographically apparent displaced rib fractures. Visualized upper abdomen is unremarkable. There is a hazy opacity in the right base could represent atelectasis infection. Visualized upper abdomen unremarkable. IMPRESSION: Hazy opacity in the right base could represent atelectasis or infection. Electronically Signed   By: Lorenza Cambridge M.D.   On: 02/27/2023 18:21   CT Head Wo Contrast  Result Date: 02/27/2023 CLINICAL DATA:  Fall, on blood thinners EXAM: CT HEAD WITHOUT CONTRAST CT CERVICAL SPINE WITHOUT CONTRAST TECHNIQUE: Multidetector CT imaging of the head and cervical spine was performed following the standard protocol without intravenous contrast. Multiplanar CT image reconstructions of the cervical spine were also generated. RADIATION DOSE REDUCTION: This exam was performed according to the departmental dose-optimization program which includes automated exposure control, adjustment of the mA and/or kV according to patient size and/or use of iterative reconstruction technique. COMPARISON:  MR brain, 01/03/2023 FINDINGS: CT HEAD FINDINGS Brain: No evidence of acute infarction, hemorrhage, hydrocephalus, extra-axial collection or mass lesion/mass effect. Unchanged, chronic right MCA territory encephalomalacia. Vascular: No hyperdense vessel or unexpected calcification. Skull: Normal. Negative for fracture or  focal lesion. Sinuses/Orbits: No acute finding. Other: Soft tissue contusion of the left occiput (series 8, image 22). CT CERVICAL SPINE FINDINGS Alignment: Degenerative straightening of the normal cervical lordosis. Skull base and vertebrae: No acute fracture. No primary bone lesion or focal pathologic process. Soft tissues and spinal canal: No prevertebral fluid or swelling. No visible canal hematoma. Disc levels: Moderate disc space height loss and osteophytosis of the lower cervical levels. Upper chest: Negative. Other: None. IMPRESSION: 1. No acute intracranial pathology. Unchanged, chronic right MCA territory encephalomalacia. 2. Soft tissue contusion of the left occiput. 3. No fracture or static subluxation of the cervical spine. 4. 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 Electronically Signed   By: Jearld Lesch M.D.   On: 02/27/2023 18:12   CT Cervical Spine Wo Contrast  Result Date: 02/27/2023 CLINICAL DATA:  Fall, on blood thinners EXAM: CT HEAD WITHOUT CONTRAST CT CERVICAL SPINE WITHOUT CONTRAST TECHNIQUE: Multidetector CT imaging of the head and cervical spine was performed following the standard protocol without intravenous contrast. Multiplanar CT image reconstructions of the cervical spine were also generated. RADIATION DOSE REDUCTION: This exam was performed according to the departmental dose-optimization program which includes automated exposure control, adjustment of the mA and/or kV according to patient size and/or use of iterative reconstruction technique. COMPARISON:  MR brain, 01/03/2023 FINDINGS: CT HEAD FINDINGS Brain: No evidence of acute infarction, hemorrhage, hydrocephalus, extra-axial collection or mass lesion/mass effect. Unchanged, chronic right MCA territory encephalomalacia. Vascular: No hyperdense vessel or unexpected calcification. Skull: Normal. Negative for fracture or focal lesion. Sinuses/Orbits: No acute finding. Other: Soft tissue contusion of the  left occiput (series 8, image 22). CT CERVICAL SPINE FINDINGS Alignment: Degenerative straightening of the normal cervical lordosis. Skull base and vertebrae: No acute fracture. No primary bone lesion or focal pathologic process. Soft tissues and spinal canal: No prevertebral fluid or swelling. No visible canal hematoma. Disc levels: Moderate disc space height loss  and osteophytosis of the lower cervical levels. Upper chest: Negative. Other: None. IMPRESSION: 1. No acute intracranial pathology. Unchanged, chronic right MCA territory encephalomalacia. 2. Soft tissue contusion of the left occiput. 3. No fracture or static subluxation of the cervical spine. 4. 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 Electronically Signed   By: Jearld Lesch M.D.   On: 02/27/2023 18:12   DG Hand Complete Left  Result Date: 02/27/2023 CLINICAL DATA:  Fall. EXAM: LEFT HAND - COMPLETE 3+ VIEW COMPARISON:  None Available. FINDINGS: There is diffuse decreased bone mineralization. The second and fifth DIP joints are markedly flexed on all views, causing bone overlap and limiting evaluation. The technologist reports the patient's hand was contracted. Oblique view is limited by patient motion artifact. 4 mm ulnar positive variance. Mild joint space narrowing of the intercarpal joints and interphalangeal joints. No definite acute fracture is seen. No dislocation. IMPRESSION: 1. Limited study due to patient contractures and motion artifact. 2. No definite acute fracture is seen. 3. Mild osteoarthritis. Electronically Signed   By: Neita Garnet M.D.   On: 02/27/2023 17:15   DG Hand Complete Right  Result Date: 02/27/2023 CLINICAL DATA:  Fall. EXAM: RIGHT HAND - COMPLETE 3+ VIEW COMPARISON:  None Available. FINDINGS: There is diffuse decreased bone mineralization. Minimal second through fifth DIP joint space narrowing. Moderate thumb carpometacarpal joint space narrowing, subchondral sclerosis, and peripheral  osteophytosis. 8 mm ulnar positive variance. No acute fracture is seen.  No dislocation. IMPRESSION: 1. No acute fracture. 2. Moderate thumb carpometacarpal osteoarthritis. Electronically Signed   By: Neita Garnet M.D.   On: 02/27/2023 17:14   DG Knee Complete 4 Views Left  Result Date: 02/27/2023 CLINICAL DATA:  Fall. EXAM: LEFT KNEE - COMPLETE 4+ VIEW COMPARISON:  None Available. FINDINGS: There is diffuse decreased bone mineralization. Mild medial compartment joint space narrowing. Lateral view is obliqued and limited by patient motion artifact. No definite acute fracture is seen. No dislocation. Partial visualization of left femoral artery stent graft. Mild-to-moderate atherosclerotic calcifications. IMPRESSION: 1. No definite acute fracture is seen. 2. Mild medial compartment joint space narrowing. Electronically Signed   By: Neita Garnet M.D.   On: 02/27/2023 17:11   DG Hip Unilat W or Wo Pelvis 2-3 Views Left  Result Date: 02/27/2023 CLINICAL DATA:  Fall. EXAM: DG HIP (WITH OR WITHOUT PELVIS) 2-3V LEFT COMPARISON:  Pelvis and left hip radiographs 08/27/2021 FINDINGS: There is diffuse decreased bone mineralization. Status post total left hip arthroplasty. No perihardware lucency is seen to indicate hardware failure or loosening. Severe right femoroacetabular joint space narrowing. Redemonstration of superior right femoral head subchondral lucency suspicious for a cortical defect with surrounding sclerosis. Mild-to-moderate right femoral head-neck junction degenerative osteophytosis. The pubic symphysis joint space is maintained. No acute fracture is seen. There are new likely left common iliac, external iliac, and superficial femoral stent grafts compared to prior. IMPRESSION: 1. Status post total left hip arthroplasty. No evidence of hardware failure or loosening. 2. Severe right femoroacetabular osteoarthritis. Redemonstration of superior right femoral head subchondral lucency suspicious for a  cortical defect with surrounding sclerosis. Findings are again suggestive of a remote subchondral insufficiency fracture and possible avascular necrosis. Electronically Signed   By: Neita Garnet M.D.   On: 02/27/2023 17:10    Micro Results   Recent Results (from the past 240 hour(s))  Blood Culture (routine x 2)     Status: None (Preliminary result)   Collection Time: 02/27/23  6:16 PM  Specimen: Right Antecubital; Blood  Result Value Ref Range Status   Specimen Description RIGHT ANTECUBITAL  Final   Special Requests   Final    BOTTLES DRAWN AEROBIC AND ANAEROBIC Blood Culture adequate volume   Culture   Final    NO GROWTH 4 DAYS Performed at [facility], [address] [place], [place] [address]    Report Status PENDING  Incomplete  Urine Culture (for pregnant, neutropenic or urologic patients or patients with an indwelling urinary catheter)     Status: Abnormal   Collection Time: 02/27/23  7:00 PM   Specimen: Urine, Clean Catch  Result Value Ref Range Status   Specimen Description   Final    URINE, CLEAN CATCH Performed at [facility], [address] [place], [place] [address]    Special Requests   Final    NONE Performed at [facility], [address], [place], [place] [address]    Culture >=100,000 COLONIES/mL ESCHERICHIA COLI (A)  Final   Report Status 03/02/2023 FINAL  Final   Organism ID, Bacteria ESCHERICHIA COLI (A)  Final      Susceptibility   Escherichia coli - MIC*    AMPICILLIN <=2 SENSITIVE Sensitive     CEFAZOLIN <=4 SENSITIVE Sensitive     CEFEPIME <=0.12 SENSITIVE Sensitive     CEFTRIAXONE <=0.25 SENSITIVE Sensitive     CIPROFLOXACIN <=0.25 SENSITIVE Sensitive     GENTAMICIN <=1 SENSITIVE Sensitive     IMIPENEM <=0.25 SENSITIVE Sensitive     NITROFURANTOIN <=16 SENSITIVE Sensitive     TRIMETH/SULFA <=20 SENSITIVE Sensitive     AMPICILLIN/SULBACTAM <=2 SENSITIVE Sensitive     PIP/TAZO <=4 SENSITIVE Sensitive     * >=100,000 COLONIES/mL ESCHERICHIA COLI   Culture, blood (Routine X 2) w Reflex to ID Panel     Status: None (Preliminary result)   Collection Time: 02/27/23  7:57 PM   Specimen: BLOOD RIGHT WRIST  Result Value Ref Range Status   Specimen Description BLOOD RIGHT WRIST  Final   Special Requests   Final    BOTTLES DRAWN AEROBIC ONLY Blood Culture adequate volume   Culture   Final    NO GROWTH 4 DAYS Performed at [facility], [address] [place], [place] [address]    Report Status PENDING  Incomplete   Today   Subjective    Nahsir Iuliano today has no new complaints -No further behavioral concerns this morning -No fever  Or chills   No Nausea, Vomiting or Diarrhea          Patient has been seen and examined prior to discharge   Objective   Blood pressure (!) 180/84, pulse 75, temperature 97.7 F (36.5 C), temperature source Oral, resp. rate 20, height 5\' 9"  (1.753 m), weight 58 kg, SpO2 98%.   Intake/Output Summary (Last 24 hours) at 03/03/2023 1129 Last data filed at 03/03/2023 0943 Gross per 24 hour  Intake 2268.24 ml  Output 2000 ml  Net 268.24 ml    Exam Gen:- Awake Alert, no acute distress  HEENT:- [place].AT, No sclera icterus Neck-Supple Neck,No JVD,.  Lungs-  CTAB , good air movement bilaterally CV- S1, S2 normal, regular Abd-  +ve B.Sounds, Abd Soft, No tenderness,    Extremity/Skin:- No  edema,   good pulses Psych-affect is appropriate, oriented x3, occasional episodes of disorientation and confusion Neuro-generalized weakness, no new focal deficits, no tremors,   Data Review   CBC w Diff:  Lab Results  Component Value Date   WBC  12.3 (H) 03/02/2023   HGB 10.8 (L) 03/02/2023   HCT 34.4 (L) 03/02/2023   PLT 322 03/02/2023   LYMPHOPCT 31 02/28/2023   MONOPCT 12 02/28/2023   EOSPCT 2 02/28/2023   BASOPCT 0 02/28/2023    CMP:  Lab Results  Component Value Date   NA 141 03/02/2023   K 3.8 03/02/2023   CL 106 03/02/2023   CO2 25 03/02/2023   BUN 11 03/02/2023   CREATININE 0.73  03/02/2023   PROT 6.1 (L) 02/28/2023   ALBUMIN 3.0 (L) 02/28/2023   BILITOT 0.3 02/28/2023   ALKPHOS 57 02/28/2023   AST 17 02/28/2023   ALT 14 02/28/2023  .  Total Discharge time is about 33 minutes  Shon Hale M.D on 03/03/2023 at 11:29 AM  Go to [email or web address] -  for contact info  [facility] - Office  [phone]

## 2023-03-03 NOTE — TOC Transition Note (Signed)
Transition of Care [facility]) - CM/SW Discharge Note   Patient Details  Name: Jacob Mcgrath. MRN: [id number] Date of Birth: 1956-07-25  [facility] CM/SW Contact:  Leitha Bleak, RN Phone Number: 03/03/2023, 12:57 PM   Clinical Narrative:   Patient ready to discharge to [facility]. Legal Guardian, Kayla updated. TOC will email DC summary to legal guardian. DC summary sent to Eunice Blase, RN calling report, TOC added patient to EMS list for transport.     Final next level of care: Skilled Nursing Facility Barriers to Discharge: Barriers Resolved   Patient Goals and CMS Choice CMS Medicare.gov Compare Post Acute Care list provided to:: Legal Guardian    Discharge Placement                 Patient to be transferred to facility by: EMS Name of family member notified: Legal Guardian Patient and family notified of of transfer: 03/03/23  Discharge Plan and Services Additional resources added to the After Visit Summary for      Post Acute Care Choice: Skilled Nursing Facility               Social Determinants of Health (SDOH) Interventions SDOH Screenings   Housing: Patient Unable To Answer (03/03/2023)  Tobacco Use: High Risk (02/27/2023)     Readmission Risk Interventions    03/01/2023    2:40 PM  Readmission Risk Prevention Plan  Transportation Screening Complete  PCP or Specialist Appt within 5-7 Days Not Complete  Home Care Screening Complete  Medication Review (RN CM) Complete

## 2023-03-03 NOTE — Care Management Important Message (Signed)
Important Message  Patient Details  Name: Jacob Mcgrath. MRN: [id number] Date of Birth: 02-15-1956   Medicare Important Message Given:  N/A - LOS <3 / Initial given by admissions     Corey Harold 03/03/2023, 11:40 AM

## 2023-03-03 NOTE — Progress Notes (Signed)
Patient's legal guardian, Esaw Dace, made aware patient is being transported to [facility]. IV is removed. Cane and black jacket left with patient.

## 2023-03-03 NOTE — Progress Notes (Signed)
Patient resting in bed watching TV at this time. Ate 100% breakfast. Calm and cooperative at this time.

## 2023-03-03 NOTE — Progress Notes (Signed)
Pt has become increasingly agitated and now combative with nursing. Plan to give Haldol.

## 2023-03-03 NOTE — Progress Notes (Signed)
Pt exited bed several times during the night. Pt very unstable and out of bed upon entry of room. Nursing staff attempted to get pt back to bed and he refused. Pt became agitated and began to kick staff. Concerns for pt's safety and nursing staff verbalized to MD. 1 mg Haldol given per order and was effective in decreasing agitation. Bed alarm on, bed in lowest position, and call bell within reach at this time.   ACTH stimulation order completed this a.m. New PIV started in L arm (24 g) after several attempts by multiple staff members.   Vitals stable this a.m.

## 2023-03-03 NOTE — Discharge Instructions (Signed)
1)Avoid dehydration encourage adequate oral intake  2)Avoid ibuprofen/Advil/Aleve/Motrin/Goody Powders/Naproxen/BC powders/Meloxicam/Diclofenac/Indomethacin and other Nonsteroidal anti-inflammatory medications as these will make you more likely to bleed and can cause stomach ulcers, can also cause Kidney problems.   3)Repeat CBC and BMP Blood Tests in 1 week advised

## 2023-04-17 IMAGING — CT CT CHEST LUNG CANCER SCREENING LOW DOSE W/O CM
1 of 3 series · 14 of 31 positions shown, 18 images · non-contrast
Comparison: None.

CLINICAL DATA: 65-year-old male with 20 pack-year history of
smoking. Lung cancer screening.



[Series 4: lungs · axial · 0.68mm/px · z∈[+1074,+1354]mm · 14 of 309 slices shown, 18 images]
[im 15/309  mediastinal]
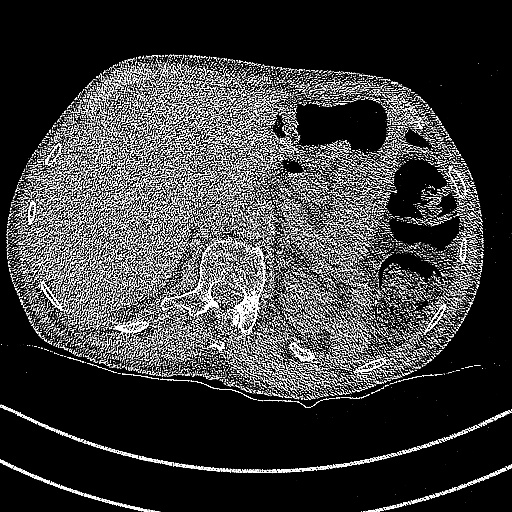
[im 15/309  lung]
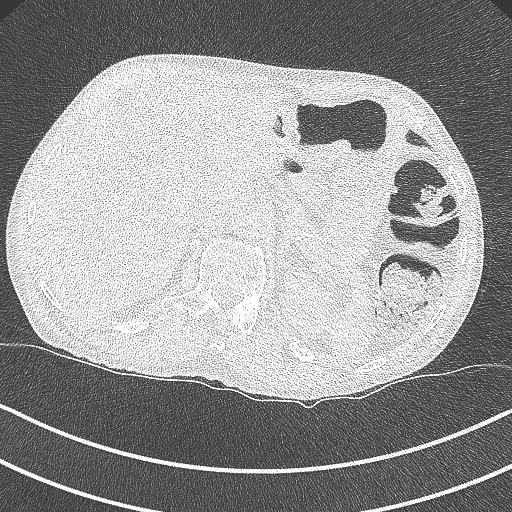
[im 43/309  lung]
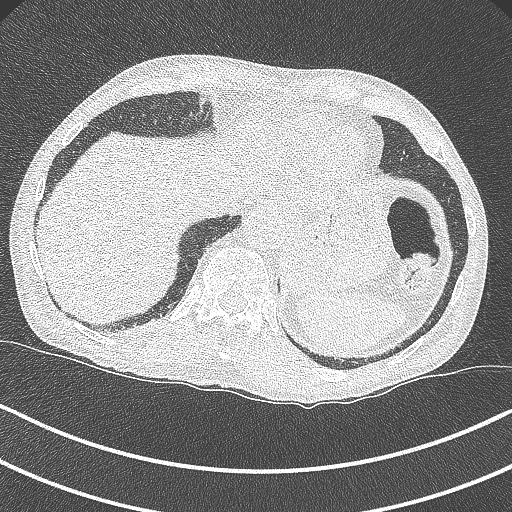
[im 57/309  lung]
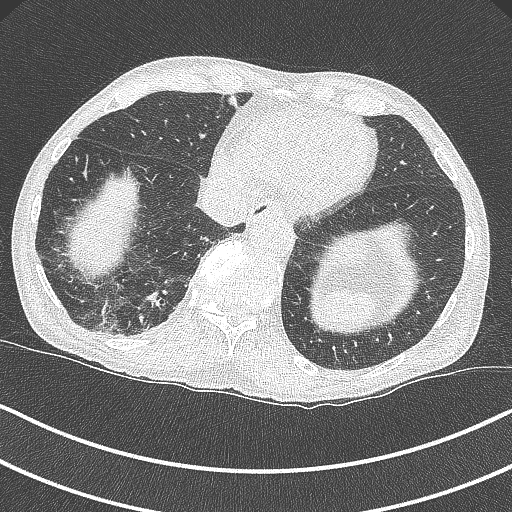
[im 85/309  lung]
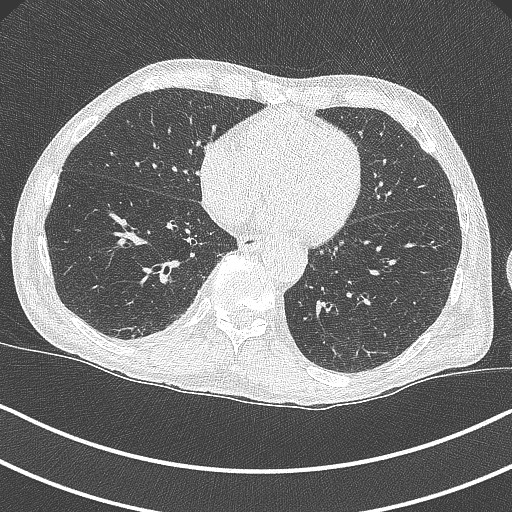
[im 99/309  mediastinal]
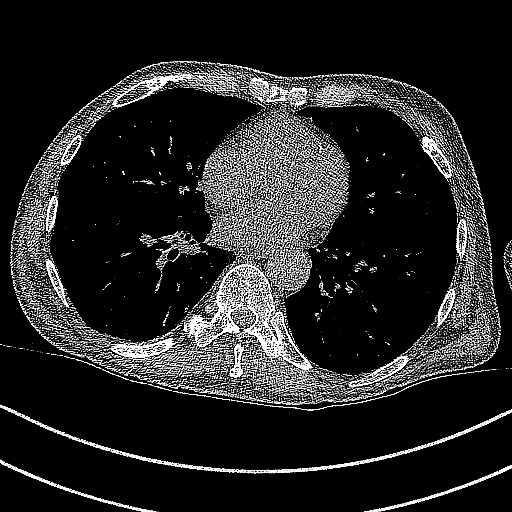
[im 99/309  lung]
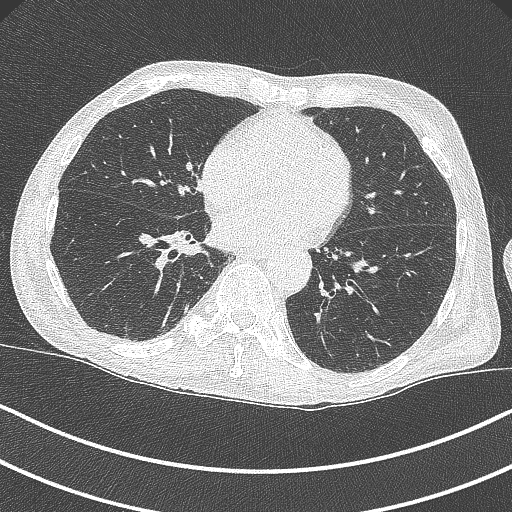
[im 127/309  lung]
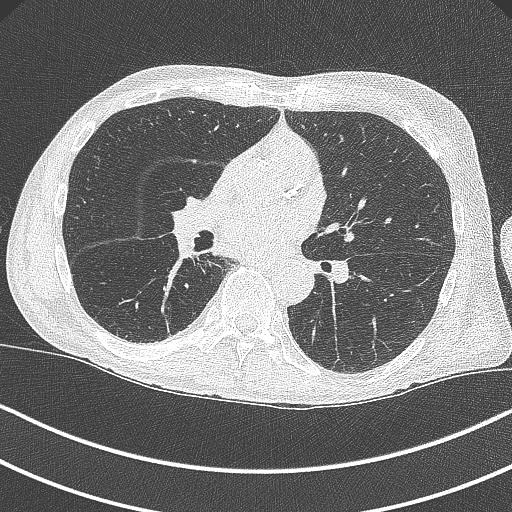
[im 141/309  lung]
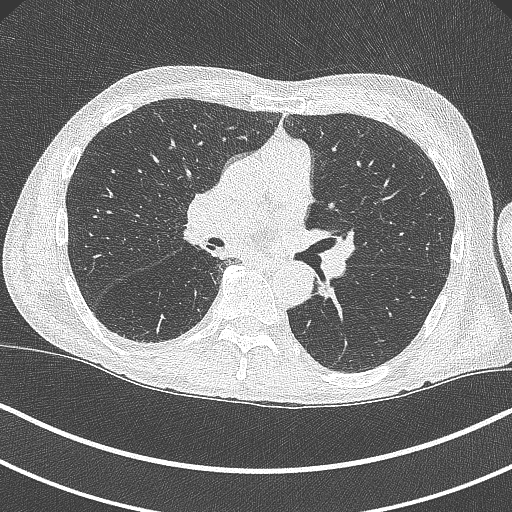
[im 169/309  lung]
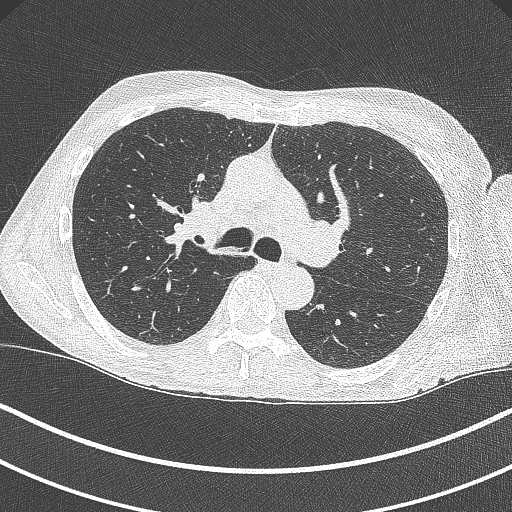
[im 183/309  mediastinal]
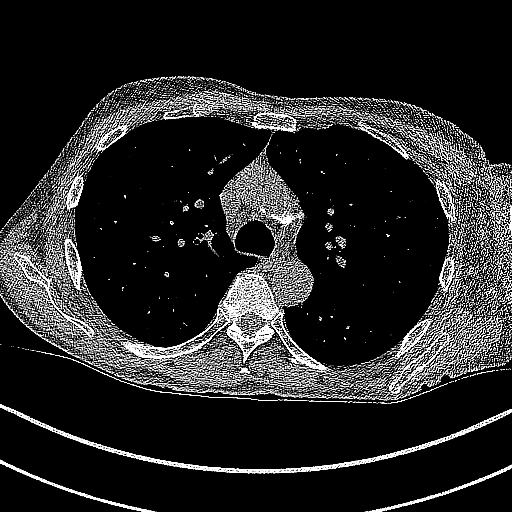
[im 183/309  lung]
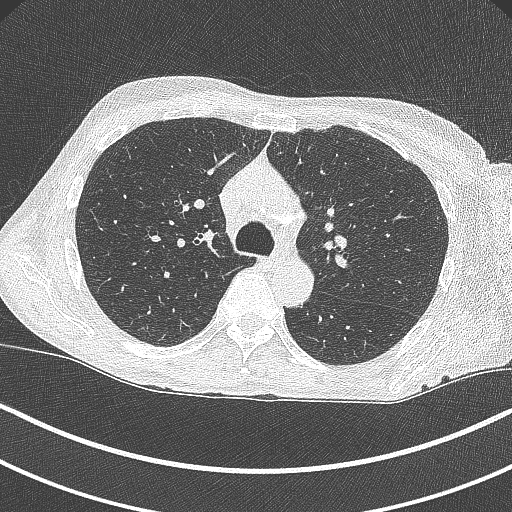
[im 211/309  lung]
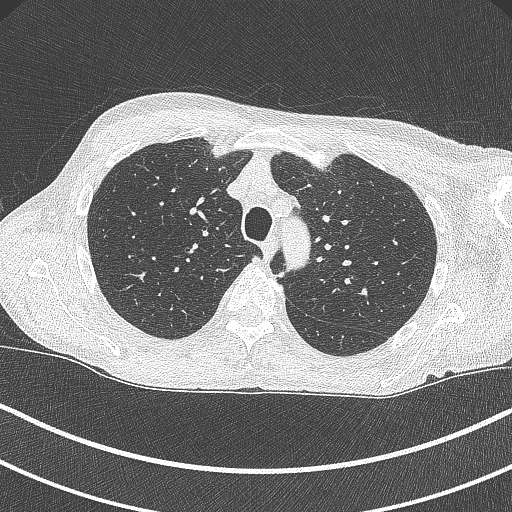
[im 225/309  lung]
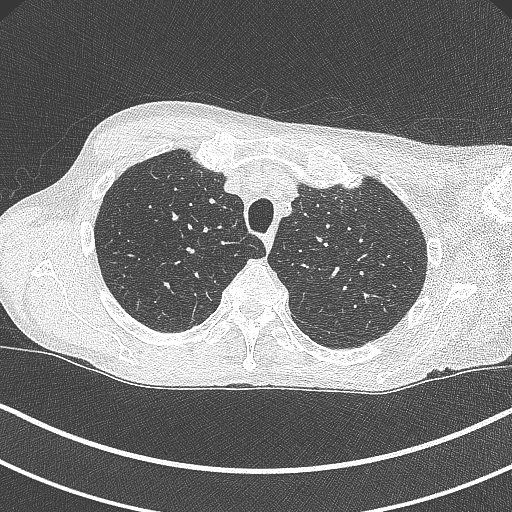
[im 253/309  lung]
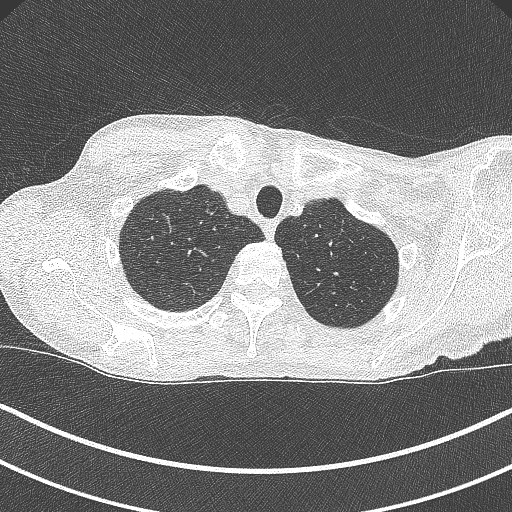
[im 267/309  mediastinal]
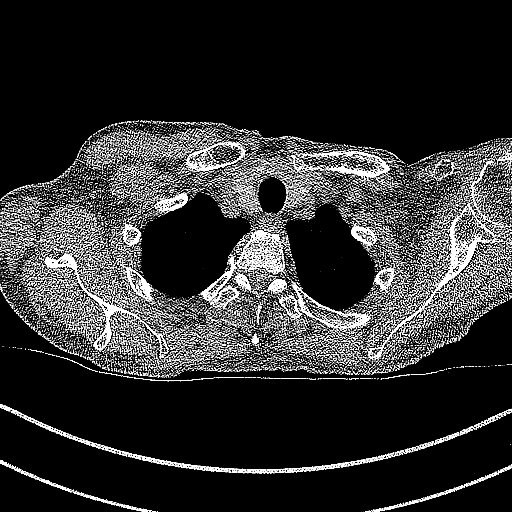
[im 267/309  lung]
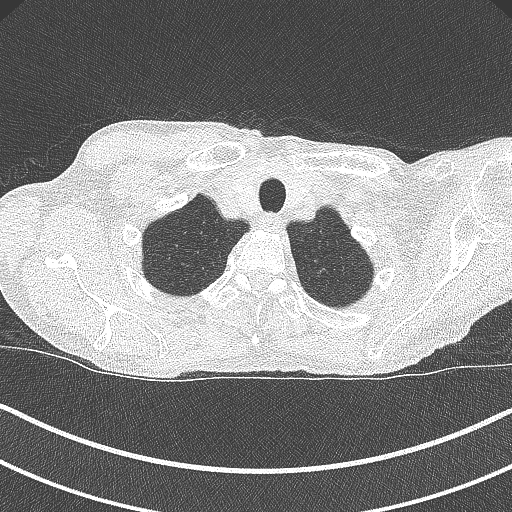
[im 295/309  lung]
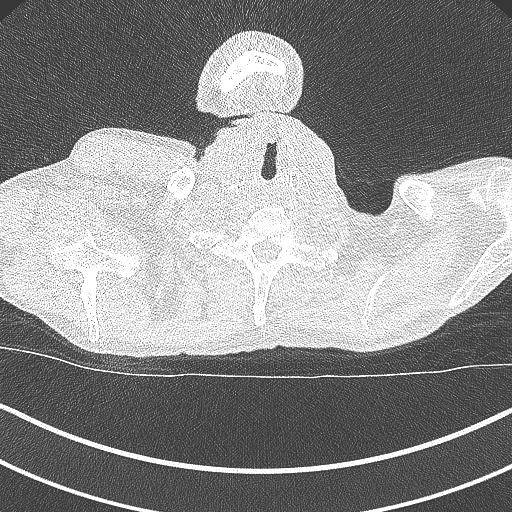

[14 of 31 positions shown; findings below may reference images not displayed]

FINDINGS: Cardiovascular: The heart size is normal. No substantial pericardial
effusion. Coronary artery calcification is evident. Moderate
atherosclerotic calcification is noted in the wall of the thoracic
aorta.

Mediastinum/Nodes: 13 mm short axis subcarinal lymph node on image
33/2 is mildly enlarged. No other mediastinal lymphadenopathy. No
evidence for gross hilar lymphadenopathy although assessment is
limited by the lack of intravenous contrast on the current study.
The esophagus has normal imaging features. There is no axillary
lymphadenopathy.

Lungs/Pleura: Centrilobular emphsyema noted. Dependent secretions
noted right mainstem bronchus. No suspicious pulmonary nodule or
mass. No focal airspace consolidation. No pleural effusion.

Upper Abdomen: Unremarkable.

Musculoskeletal: No worrisome lytic or sclerotic osseous
abnormality. Multiple nonacute anterior left rib fractures evident.
IMPRESSION: 1. Lung-RADS 1, negative. Continue annual screening with low-dose
chest CT without contrast in 12 months.
2. Isolated mildly enlarged subcarinal lymph node. While this is
most likely reactive, follow-up non lung cancer screening CT chest
without contrast in 3 months recommended to ensure stability.
3. Aortic Atherosclerosis (GYFFB-RPI.I) and Emphysema (GYFFB-P27.I).

These results will be called to the ordering clinician or
representative by the Radiologist Assistant, and communication
documented in the PACS or [REDACTED].

## 2023-05-23 NOTE — Progress Notes (Unsigned)
[facility] PROGRESS NOTE  ASSESSMENT / PLAN: Jacob Mcgrath. is a 67 y.o. male has post left iliac and femoral-popliteal angioplasty and stenting for limb threatening ischemia.  His left foot has healed. He should follow-up with me in 12 months with a repeat ankle-brachial index.  SUBJECTIVE: Well overall.  He is in a nursing facility.  Foot has healed. No new issues. Not walking much.  OBJECTIVE: BP (!) 144/70 (BP Location: Left Arm, Patient Position: Sitting, Cuff Size: Normal)   Pulse 84   Temp 97.6 F (36.4 C)   Resp 20   Ht 5\' 9"  (1.753 m)   Wt 127 lb (57.6 kg)   SpO2 94%   BMI 18.75 kg/m   Chronically ill gentleman in no acute distress Regular rate and rhythm Unlabored breathing Warm well-perfused feet Left foot healed     Latest Ref Rng & Units 03/02/2023    4:38 AM 02/28/2023    4:03 AM 02/27/2023    3:48 PM  CBC  WBC 4.0 - 10.5 K/uL 12.3  12.3  16.3   Hemoglobin 13.0 - 17.0 g/dL 06.3  9.7  01.6   Hematocrit 39.0 - 52.0 % 34.4  30.5  36.2   Platelets 150 - 400 K/uL 322  325  365         Latest Ref Rng & Units 03/02/2023    4:38 AM 02/28/2023    4:03 AM 02/27/2023    3:48 PM  CMP  Glucose 70 - 99 mg/dL 010  932  355   BUN 8 - 23 mg/dL 11  13  14    Creatinine 0.61 - 1.24 mg/dL 7.32  2.02  5.42   Sodium 135 - 145 mmol/L 141  139  139   Potassium 3.5 - 5.1 mmol/L 3.8  3.5  4.6   Chloride 98 - 111 mmol/L 106  106  106   CO2 22 - 32 mmol/L 25  25  19    Calcium 8.9 - 10.3 mg/dL 8.7  8.5  9.0   Total Protein 6.5 - 8.1 g/dL  6.1    Total Bilirubin 0.3 - 1.2 mg/dL  0.3    Alkaline Phos 38 - 126 U/L  57    AST 15 - 41 U/L  17    ALT 0 - 44 U/L  14      +-------+-----------+-----------+------------+------------+  ABI/TBIToday's ABIToday's TBIPrevious ABIPrevious TBI  +-------+-----------+-----------+------------+------------+  Right 0.62       absent     0.74        0.53           +-------+-----------+-----------+------------+------------+  Left  1.23       absent     1.11        0.81          +-------+-----------+-----------+------------+------------+     Rande Brunt. Lenell Antu, MD [facility] Phone Number: [phone] 05/24/2023 3:26 PM

## 2023-05-24 ENCOUNTER — Ambulatory Visit (HOSPITAL_COMMUNITY)
Admission: RE | Admit: 2023-05-24 | Discharge: 2023-05-24 | Disposition: A | Discharge status: Home / Self Care | Payer: No Typology Code available for payment source | Source: Ambulatory Visit | Attending: Vascular Surgery | Admitting: Vascular Surgery

## 2023-05-24 ENCOUNTER — Ambulatory Visit (INDEPENDENT_AMBULATORY_CARE_PROVIDER_SITE_OTHER): Payer: Medicare Other | Admitting: Vascular Surgery

## 2023-05-24 ENCOUNTER — Encounter: Payer: Self-pay | Admitting: Vascular Surgery

## 2023-05-24 VITALS — BP 144/70 | HR 84 | Temp 97.6°F | Resp 20 | Ht 69.0 in | Wt 127.0 lb

## 2023-05-24 DIAGNOSIS — I739 Peripheral vascular disease, unspecified: Secondary | ICD-10-CM

## 2023-05-24 DIAGNOSIS — I998 Other disorder of circulatory system: Secondary | ICD-10-CM | POA: Diagnosis not present

## 2023-05-24 LAB — VAS US ABI WITH/WO TBI
Left ABI: 1.23
Right ABI: 0.62

## 2023-07-01 ENCOUNTER — Inpatient Hospital Stay (HOSPITAL_COMMUNITY): Payer: Medicare Other

## 2023-07-01 ENCOUNTER — Other Ambulatory Visit (HOSPITAL_COMMUNITY): Payer: Self-pay | Admitting: *Deleted

## 2023-07-01 ENCOUNTER — Emergency Department (HOSPITAL_COMMUNITY): Payer: Medicare Other

## 2023-07-01 ENCOUNTER — Other Ambulatory Visit: Payer: Self-pay

## 2023-07-01 ENCOUNTER — Inpatient Hospital Stay (HOSPITAL_COMMUNITY)
Admission: EM | Admit: 2023-07-01 | Discharge: 2023-07-05 | DRG: 100 | Disposition: A | Discharge status: Skilled Nursing Facility | Payer: Medicare Other | Source: Skilled Nursing Facility | Attending: Internal Medicine | Admitting: Internal Medicine

## 2023-07-01 ENCOUNTER — Encounter (HOSPITAL_COMMUNITY): Payer: Self-pay | Admitting: Family Medicine

## 2023-07-01 ENCOUNTER — Inpatient Hospital Stay (HOSPITAL_COMMUNITY)
Admit: 2023-07-01 | Discharge: 2023-07-01 | Disposition: A | Discharge status: Home / Self Care | Payer: Medicare Other | Attending: Emergency Medicine

## 2023-07-01 DIAGNOSIS — G40109 Localization-related (focal) (partial) symptomatic epilepsy and epileptic syndromes with simple partial seizures, not intractable, without status epilepticus: Secondary | ICD-10-CM | POA: Diagnosis present

## 2023-07-01 DIAGNOSIS — R4701 Aphasia: Secondary | ICD-10-CM | POA: Diagnosis present

## 2023-07-01 DIAGNOSIS — E872 Acidosis, unspecified: Secondary | ICD-10-CM | POA: Diagnosis present

## 2023-07-01 DIAGNOSIS — J189 Pneumonia, unspecified organism: Secondary | ICD-10-CM | POA: Diagnosis present

## 2023-07-01 DIAGNOSIS — N390 Urinary tract infection, site not specified: Secondary | ICD-10-CM | POA: Diagnosis present

## 2023-07-01 DIAGNOSIS — F0393 Unspecified dementia, unspecified severity, with mood disturbance: Secondary | ICD-10-CM | POA: Diagnosis present

## 2023-07-01 DIAGNOSIS — R400 Somnolence: Secondary | ICD-10-CM

## 2023-07-01 DIAGNOSIS — G40909 Epilepsy, unspecified, not intractable, without status epilepticus: Secondary | ICD-10-CM

## 2023-07-01 DIAGNOSIS — E1151 Type 2 diabetes mellitus with diabetic peripheral angiopathy without gangrene: Secondary | ICD-10-CM | POA: Diagnosis present

## 2023-07-01 DIAGNOSIS — E785 Hyperlipidemia, unspecified: Secondary | ICD-10-CM | POA: Diagnosis present

## 2023-07-01 DIAGNOSIS — I700 Atherosclerosis of aorta: Secondary | ICD-10-CM | POA: Diagnosis present

## 2023-07-01 DIAGNOSIS — R2990 Unspecified symptoms and signs involving the nervous system: Principal | ICD-10-CM

## 2023-07-01 DIAGNOSIS — R531 Weakness: Secondary | ICD-10-CM

## 2023-07-01 DIAGNOSIS — R0902 Hypoxemia: Secondary | ICD-10-CM | POA: Diagnosis present

## 2023-07-01 DIAGNOSIS — R29810 Facial weakness: Secondary | ICD-10-CM | POA: Diagnosis present

## 2023-07-01 DIAGNOSIS — Z515 Encounter for palliative care: Secondary | ICD-10-CM

## 2023-07-01 DIAGNOSIS — F17210 Nicotine dependence, cigarettes, uncomplicated: Secondary | ICD-10-CM | POA: Diagnosis present

## 2023-07-01 DIAGNOSIS — Z79899 Other long term (current) drug therapy: Secondary | ICD-10-CM

## 2023-07-01 DIAGNOSIS — I6389 Other cerebral infarction: Secondary | ICD-10-CM

## 2023-07-01 DIAGNOSIS — R251 Tremor, unspecified: Secondary | ICD-10-CM | POA: Diagnosis present

## 2023-07-01 DIAGNOSIS — G40209 Localization-related (focal) (partial) symptomatic epilepsy and epileptic syndromes with complex partial seizures, not intractable, without status epilepticus: Secondary | ICD-10-CM | POA: Diagnosis not present

## 2023-07-01 DIAGNOSIS — K219 Gastro-esophageal reflux disease without esophagitis: Secondary | ICD-10-CM | POA: Diagnosis present

## 2023-07-01 DIAGNOSIS — R627 Adult failure to thrive: Secondary | ICD-10-CM | POA: Diagnosis not present

## 2023-07-01 DIAGNOSIS — Z7984 Long term (current) use of oral hypoglycemic drugs: Secondary | ICD-10-CM | POA: Diagnosis not present

## 2023-07-01 DIAGNOSIS — I69354 Hemiplegia and hemiparesis following cerebral infarction affecting left non-dominant side: Secondary | ICD-10-CM | POA: Diagnosis present

## 2023-07-01 DIAGNOSIS — F32A Depression, unspecified: Secondary | ICD-10-CM | POA: Diagnosis present

## 2023-07-01 DIAGNOSIS — I639 Cerebral infarction, unspecified: Secondary | ICD-10-CM | POA: Diagnosis not present

## 2023-07-01 DIAGNOSIS — Z7189 Other specified counseling: Secondary | ICD-10-CM | POA: Diagnosis not present

## 2023-07-01 DIAGNOSIS — Z881 Allergy status to other antibiotic agents status: Secondary | ICD-10-CM | POA: Diagnosis not present

## 2023-07-01 DIAGNOSIS — Z7902 Long term (current) use of antithrombotics/antiplatelets: Secondary | ICD-10-CM | POA: Diagnosis not present

## 2023-07-01 DIAGNOSIS — G9341 Metabolic encephalopathy: Secondary | ICD-10-CM | POA: Diagnosis present

## 2023-07-01 DIAGNOSIS — E1140 Type 2 diabetes mellitus with diabetic neuropathy, unspecified: Secondary | ICD-10-CM | POA: Diagnosis present

## 2023-07-01 DIAGNOSIS — R471 Dysarthria and anarthria: Secondary | ICD-10-CM | POA: Diagnosis present

## 2023-07-01 DIAGNOSIS — R569 Unspecified convulsions: Secondary | ICD-10-CM

## 2023-07-01 DIAGNOSIS — Z79890 Hormone replacement therapy (postmenopausal): Secondary | ICD-10-CM

## 2023-07-01 DIAGNOSIS — N3000 Acute cystitis without hematuria: Secondary | ICD-10-CM | POA: Diagnosis not present

## 2023-07-01 DIAGNOSIS — I10 Essential (primary) hypertension: Secondary | ICD-10-CM | POA: Diagnosis present

## 2023-07-01 DIAGNOSIS — Z880 Allergy status to penicillin: Secondary | ICD-10-CM

## 2023-07-01 DIAGNOSIS — Z7982 Long term (current) use of aspirin: Secondary | ICD-10-CM | POA: Diagnosis not present

## 2023-07-01 DIAGNOSIS — E119 Type 2 diabetes mellitus without complications: Secondary | ICD-10-CM

## 2023-07-01 DIAGNOSIS — D72829 Elevated white blood cell count, unspecified: Secondary | ICD-10-CM | POA: Diagnosis present

## 2023-07-01 DIAGNOSIS — F419 Anxiety disorder, unspecified: Secondary | ICD-10-CM | POA: Diagnosis present

## 2023-07-01 DIAGNOSIS — I998 Other disorder of circulatory system: Secondary | ICD-10-CM | POA: Diagnosis present

## 2023-07-01 DIAGNOSIS — I708 Atherosclerosis of other arteries: Secondary | ICD-10-CM | POA: Diagnosis present

## 2023-07-01 HISTORY — DX: Cerebral infarction, unspecified: I63.9

## 2023-07-01 HISTORY — DX: Unspecified dementia, unspecified severity, without behavioral disturbance, psychotic disturbance, mood disturbance, and anxiety: F03.90

## 2023-07-01 LAB — DIFFERENTIAL
Abs Immature Granulocytes: 0.15 10*3/uL — ABNORMAL HIGH (ref 0.00–0.07)
Basophils Absolute: 0 10*3/uL (ref 0.0–0.1)
Basophils Relative: 0 %
Eosinophils Absolute: 0 10*3/uL (ref 0.0–0.5)
Eosinophils Relative: 0 %
Immature Granulocytes: 1 %
Lymphocytes Relative: 8 %
Lymphs Abs: 1.7 10*3/uL (ref 0.7–4.0)
Monocytes Absolute: 2.5 10*3/uL — ABNORMAL HIGH (ref 0.1–1.0)
Monocytes Relative: 12 %
Neutro Abs: 16.3 10*3/uL — ABNORMAL HIGH (ref 1.7–7.7)
Neutrophils Relative %: 79 %

## 2023-07-01 LAB — COMPREHENSIVE METABOLIC PANEL
ALT: 25 U/L (ref 0–44)
AST: 53 U/L — ABNORMAL HIGH (ref 15–41)
Albumin: 3.5 g/dL (ref 3.5–5.0)
Alkaline Phosphatase: 79 U/L (ref 38–126)
Anion gap: 11 (ref 5–15)
BUN: 23 mg/dL (ref 8–23)
CO2: 23 mmol/L (ref 22–32)
Calcium: 8.7 mg/dL — ABNORMAL LOW (ref 8.9–10.3)
Chloride: 104 mmol/L (ref 98–111)
Creatinine, Ser: 1.23 mg/dL (ref 0.61–1.24)
GFR, Estimated: >60 mL/min (ref >60)
Glucose, Bld: 137 mg/dL — ABNORMAL HIGH (ref 70–99)
Potassium: 5.1 mmol/L (ref 3.5–5.1)
Sodium: 138 mmol/L (ref 135–145)
Total Bilirubin: 0.5 mg/dL (ref <1.2)
Total Protein: 7.1 g/dL (ref 6.5–8.1)

## 2023-07-01 LAB — ECHOCARDIOGRAM COMPLETE
Area-P 1/2: 4.31 cm2
Height: 69 in
S' Lateral: 3.7 cm
Weight: 2031.76 [oz_av]

## 2023-07-01 LAB — GLUCOSE, CAPILLARY
Glucose-Capillary: 124 mg/dL — ABNORMAL HIGH (ref 70–99)
Glucose-Capillary: 160 mg/dL — ABNORMAL HIGH (ref 70–99)
Glucose-Capillary: 135 mg/dL — ABNORMAL HIGH (ref 70–99)

## 2023-07-01 LAB — CBC
HCT: 44.1 % (ref 39.0–52.0)
Hemoglobin: 13.6 g/dL (ref 13.0–17.0)
MCH: 29.2 pg (ref 26.0–34.0)
MCHC: 30.8 g/dL (ref 30.0–36.0)
MCV: 94.8 fL (ref 80.0–100.0)
Platelets: 263 10*3/uL (ref 150–400)
RBC: 4.65 MIL/uL (ref 4.22–5.81)
RDW: 13.7 % (ref 11.5–15.5)
WBC: 20.7 10*3/uL — ABNORMAL HIGH (ref 4.0–10.5)
nRBC: 0 % (ref 0.0–0.2)

## 2023-07-01 LAB — RAPID URINE DRUG SCREEN, HOSP PERFORMED
Amphetamines: NOT DETECTED
Barbiturates: NOT DETECTED
Benzodiazepines: NOT DETECTED
Cocaine: NOT DETECTED
Opiates: NOT DETECTED
Tetrahydrocannabinol: NOT DETECTED

## 2023-07-01 LAB — URINALYSIS, ROUTINE W REFLEX MICROSCOPIC
Bilirubin Urine: NEGATIVE
Glucose, UA: NEGATIVE mg/dL
Ketones, ur: NEGATIVE mg/dL
Nitrite: NEGATIVE
Protein, ur: 100 mg/dL — AB
Specific Gravity, Urine: >1.046 — ABNORMAL HIGH (ref 1.005–1.030)
pH: 7 (ref 5.0–8.0)

## 2023-07-01 LAB — HEMOGLOBIN A1C
Hgb A1c MFr Bld: 5.4 % (ref 4.8–5.6)
Mean Plasma Glucose: 108.28 mg/dL

## 2023-07-01 LAB — PHOSPHORUS: Phosphorus: 5.2 mg/dL — ABNORMAL HIGH (ref 2.5–4.6)

## 2023-07-01 LAB — PROTIME-INR
INR: 1.2 (ref 0.8–1.2)
Prothrombin Time: 15.8 s — ABNORMAL HIGH (ref 11.4–15.2)

## 2023-07-01 LAB — CBG MONITORING, ED: Glucose-Capillary: 123 mg/dL — ABNORMAL HIGH (ref 70–99)

## 2023-07-01 LAB — TSH: TSH: 2.06 u[IU]/mL (ref 0.350–4.500)

## 2023-07-01 LAB — PROCALCITONIN: Procalcitonin: 12.62 ng/mL

## 2023-07-01 LAB — ETHANOL: Alcohol, Ethyl (B): <10 mg/dL (ref <10)

## 2023-07-01 LAB — MAGNESIUM: Magnesium: 1.9 mg/dL (ref 1.7–2.4)

## 2023-07-01 LAB — LACTIC ACID, PLASMA: Lactic Acid, Venous: 2.4 mmol/L (ref 0.5–1.9)

## 2023-07-01 MED ORDER — SODIUM CHLORIDE 0.9 % IV SOLN: 250.0000 mL | INTRAVENOUS | Status: DC | PRN

## 2023-07-01 MED ORDER — LORAZEPAM 2 MG/ML IJ SOLN
1.0000 mg | Freq: Four times a day (QID) | INTRAMUSCULAR | Status: DC | PRN
  Administered 2023-07-04: 1 mg via INTRAVENOUS
  Filled 2023-07-01: qty 1

## 2023-07-01 MED ORDER — SODIUM CHLORIDE 0.9 % IV SOLN: INTRAVENOUS | Status: AC

## 2023-07-01 MED ORDER — FLEET ENEMA RE ENEM
1.0000 | ENEMA | Freq: Once | RECTAL | Status: DC | PRN
Start: 2023-07-01 — End: 2023-07-05

## 2023-07-01 MED ORDER — HYDROMORPHONE HCL 1 MG/ML IJ SOLN: 0.5000 mg | INTRAMUSCULAR | Status: DC | PRN

## 2023-07-01 MED ORDER — NICOTINE 14 MG/24HR TD PT24
14.0000 mg | MEDICATED_PATCH | Freq: Every day | TRANSDERMAL | Status: DC
  Administered 2023-07-01 – 2023-07-05 (×5): 14 mg via TRANSDERMAL
  Filled 2023-07-01 (×5): qty 1

## 2023-07-01 MED ORDER — PANTOPRAZOLE SODIUM 40 MG IV SOLR
40.0000 mg | INTRAVENOUS | Status: DC
  Administered 2023-07-02 – 2023-07-03 (×2): 40 mg via INTRAVENOUS
  Filled 2023-07-01 (×2): qty 10

## 2023-07-01 MED ORDER — HYDRALAZINE HCL 20 MG/ML IJ SOLN: 10.0000 mg | INTRAMUSCULAR | Status: DC | PRN

## 2023-07-01 MED ORDER — IOHEXOL 350 MG/ML SOLN
100.0000 mL | Freq: Once | INTRAVENOUS | Status: AC | PRN
  Administered 2023-07-01: 100 mL via INTRAVENOUS

## 2023-07-01 MED ORDER — LEVETIRACETAM 500 MG PO TABS: 500.0000 mg | ORAL_TABLET | Freq: Two times a day (BID) | ORAL | Status: DC

## 2023-07-01 MED ORDER — SODIUM CHLORIDE 0.9 % IV SOLN
20.0000 mg/kg | Freq: Once | INTRAVENOUS | Status: AC
  Administered 2023-07-02: 1150 mg via INTRAVENOUS
  Filled 2023-07-01 (×2): qty 11.5

## 2023-07-01 MED ORDER — SODIUM CHLORIDE 0.9% FLUSH
3.0000 mL | Freq: Two times a day (BID) | INTRAVENOUS | Status: DC
  Administered 2023-07-01 – 2023-07-05 (×6): 3 mL via INTRAVENOUS

## 2023-07-01 MED ORDER — HALOPERIDOL LACTATE 5 MG/ML IJ SOLN: 2.0000 mg | Freq: Four times a day (QID) | INTRAMUSCULAR | Status: DC | PRN

## 2023-07-01 MED ORDER — ATORVASTATIN CALCIUM 80 MG PO TABS
80.0000 mg | ORAL_TABLET | Freq: Every day | ORAL | Status: DC
  Administered 2023-07-02 – 2023-07-04 (×3): 80 mg via ORAL
  Filled 2023-07-01 (×3): qty 1

## 2023-07-01 MED ORDER — ONDANSETRON HCL 4 MG PO TABS: 4.0000 mg | ORAL_TABLET | Freq: Four times a day (QID) | ORAL | Status: DC | PRN

## 2023-07-01 MED ORDER — IPRATROPIUM BROMIDE 0.02 % IN SOLN: 0.5000 mg | Freq: Four times a day (QID) | RESPIRATORY_TRACT | Status: DC | PRN

## 2023-07-01 MED ORDER — ACETAMINOPHEN 325 MG PO TABS
650.0000 mg | ORAL_TABLET | Freq: Four times a day (QID) | ORAL | Status: DC | PRN
  Administered 2023-07-02 – 2023-07-04 (×3): 650 mg via ORAL
  Filled 2023-07-01 (×4): qty 2

## 2023-07-01 MED ORDER — SODIUM CHLORIDE 0.9% FLUSH
3.0000 mL | INTRAVENOUS | Status: DC | PRN
  Administered 2023-07-04: 3 mL via INTRAVENOUS

## 2023-07-01 MED ORDER — INSULIN ASPART 100 UNIT/ML IJ SOLN
0.0000 [IU] | Freq: Three times a day (TID) | INTRAMUSCULAR | Status: DC
  Administered 2023-07-03 – 2023-07-04 (×4): 1 [IU] via SUBCUTANEOUS

## 2023-07-01 MED ORDER — SENNOSIDES-DOCUSATE SODIUM 8.6-50 MG PO TABS
1.0000 | ORAL_TABLET | Freq: Every evening | ORAL | Status: DC | PRN
Start: 2023-07-01 — End: 2023-07-05

## 2023-07-01 MED ORDER — HEPARIN SODIUM (PORCINE) 5000 UNIT/ML IJ SOLN
5000.0000 [IU] | Freq: Three times a day (TID) | INTRAMUSCULAR | Status: DC
  Administered 2023-07-02 – 2023-07-05 (×12): 5000 [IU] via SUBCUTANEOUS
  Filled 2023-07-01 (×12): qty 1

## 2023-07-01 MED ORDER — ASPIRIN 325 MG PO TABS
325.0000 mg | ORAL_TABLET | Freq: Once | ORAL | Status: AC
  Administered 2023-07-01: 325 mg via ORAL
  Filled 2023-07-01: qty 1

## 2023-07-01 MED ORDER — PERFLUTREN LIPID MICROSPHERE
1.0000 mL | INTRAVENOUS | Status: AC | PRN
  Administered 2023-07-01: 3 mL via INTRAVENOUS

## 2023-07-01 MED ORDER — OXYCODONE HCL 5 MG PO TABS: 5.0000 mg | ORAL_TABLET | ORAL | Status: DC | PRN

## 2023-07-01 MED ORDER — CLOPIDOGREL BISULFATE 75 MG PO TABS
300.0000 mg | ORAL_TABLET | Freq: Once | ORAL | Status: AC
  Administered 2023-07-01: 300 mg via ORAL
  Filled 2023-07-01: qty 4

## 2023-07-01 MED ORDER — ACETAMINOPHEN 650 MG RE SUPP: 650.0000 mg | Freq: Four times a day (QID) | RECTAL | Status: DC | PRN

## 2023-07-01 MED ORDER — SODIUM CHLORIDE 0.9% FLUSH
3.0000 mL | Freq: Two times a day (BID) | INTRAVENOUS | Status: DC
  Administered 2023-07-01 – 2023-07-05 (×7): 3 mL via INTRAVENOUS

## 2023-07-01 MED ORDER — MIDODRINE HCL 5 MG PO TABS: 5.0000 mg | ORAL_TABLET | Freq: Three times a day (TID) | ORAL | Status: DC

## 2023-07-01 MED ORDER — BISACODYL 5 MG PO TBEC: 5.0000 mg | DELAYED_RELEASE_TABLET | Freq: Every day | ORAL | Status: DC | PRN

## 2023-07-01 MED ORDER — ONDANSETRON HCL 4 MG/2ML IJ SOLN: 4.0000 mg | Freq: Four times a day (QID) | INTRAMUSCULAR | Status: DC | PRN

## 2023-07-01 MED ORDER — TRAZODONE HCL 50 MG PO TABS: 25.0000 mg | ORAL_TABLET | Freq: Every evening | ORAL | Status: DC | PRN

## 2023-07-01 NOTE — Assessment & Plan Note (Addendum)
-  Tensive history of peripheral vascular disease with history of revascularization - He was supposed to be on aspirin and Plavix (?  Compliance) Once tolerating p.o. will resume -With chronically left-sided weakness Limited ambulate at baseline with cane and wheelchair

## 2023-07-01 NOTE — Assessment & Plan Note (Signed)
-   Hypertension with history of hypotension, currently med rec revealed patient is on midodrine -We will holding home medication of Lopressor, will resume midodrine when able to tolerate PO

## 2023-07-01 NOTE — Assessment & Plan Note (Signed)
Home medication revealed Cymbalta, Paxil, Seroquel, as needed Ativan, -All oral medication will be on hold -Will utilize as needed Haldol

## 2023-07-01 NOTE — Assessment & Plan Note (Signed)
Changing p.o. PPI to IV Protonix

## 2023-07-01 NOTE — Progress Notes (Signed)
SLP Cancellation Note  Patient Details Name: Jacob Mcgrath. MRN: [id number] DOB: [birth year]   Cancelled treatment:       Reason Eval/Treat Not Completed: Other (comment) Pt passed [facility] Stroke swallow screen which negates full BSE. Spoke with RN who has no concerns for swallowing. RN did report changes in speech in the past hour or so, MD may want to consider ordering SLE if speech changes do not spontaneously resolve. Will complete BSE order, thank you,  Camani Sesay H. Romie Levee, CCC-SLP Speech Language Pathologist    Georgetta Haber 07/01/2023, 1:58 PM

## 2023-07-01 NOTE — Hospital Course (Signed)
Jacob Dueck. is a 67 year old male from SNF with a significant history of DM 2, neuropathy, HTN, HDL, GERD, depression, anxiety, PVD, ambulates with cane, with ischemic limb, he is chronically on aspirin and Plavix with history of previous stroke contracted left hand at baseline. Presented today to the ED from [facility] change in mental status.  Last noted normal 7 PM yesterday.  This morning he was less responsive.  Was also found cold and clammy.  Was drooling from right facial and mouth drooping, unable to speak.  Usually awake alert x 1 able to communicate. Patient unable to provide any further history.  Patient was also noted to briefly hypoxic on arrival satting 84% on room air.  ED evaluation: Blood pressure (!) 150/94, pulse 99, temperature 98.6 F (37 C), temperature source Oral, resp. rate 16, SpO2 91% on 4 L   Labs: CBC WBC 20.7, calcium 8.7, glucose 137,  CT angio: head and neck: IMPRESSION: 1. No acute intracranial process. ASPECTS is 10. 2. No intracranial large vessel occlusion. 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 Moderate stenosis right cavernous and bilateral supraclinoid ICA and more proximal left V4 3. 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 The left vertebral artery is poorly opacified at its origin and in the left V1 and proximal V2 segments, with improved opacification distally, likely reflecting retrograde flow to the left subclavian this territory. 4. No hemodynamically significant stenosis in the neck. 5. No evidence of infarct core or penumbra on CT perfusion. 6. Aortic Atherosclerosis  Teleneurology Dr. Selina Cooley consulted Pending MRI of the brain  Requested to be admitted for new stroke and work up.

## 2023-07-01 NOTE — Progress Notes (Signed)
*  PRELIMINARY RESULTS* Echocardiogram 2D Echocardiogram has been performed with Definity.  Stacey Drain 07/01/2023, 3:06 PM

## 2023-07-01 NOTE — ED Triage Notes (Signed)
Pt brought in by [facility] from [facility] with c/o AMS for unknown amount of time. Staff went in to check on pt around 1000 and they report he would normally be up and going, but he wasn't today so they activated 911.

## 2023-07-01 NOTE — Procedures (Signed)
Patient Name: Jacob Mcgrath.  MRN: [id number]  Epilepsy Attending: Charlsie Quest  Referring Physician/Provider: Kendell Bane, MD  Date: 07/01/2023 Duration: 24.21 mins  Patient history: 67yo M with  left sided weakness getting eeg to evaluate for seizure  Level of alertness: Awake  AEDs during EEG study: None  Technical aspects: This EEG study was done with scalp electrodes positioned according to the 10-20 International system of electrode placement. Electrical activity was reviewed with band pass filter of 1-70Hz , sensitivity of 7 uV/mm, display speed of 35mm/sec with a 60Hz  notched filter applied as appropriate. EEG data were recorded continuously and digitally stored.  Video monitoring was available and reviewed as appropriate.  Description: The posterior dominant rhythm consists of 8 Hz activity of moderate voltage (25-35 uV) seen predominantly in posterior head regions, asymmetric ( right<left) and reactive to eye opening and eye closing. EEG showed continuous 3 to 6 Hz theta-delta slowing in right hemisphere. Sharp waves were noted in right centro-temporal region. Intermittent generalized 3-5hz  theta-delta slowing was also noted. Hyperventilation and photic stimulation were not performed.     Of note, parts of study were difficult to interpret due to significant myogenic artifact.   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 Additionally there is cortical dysfunction arising from right hemisphere likely secondary to underlying structural abnormality, post-ictal state. Lastly there was  mild diffuse encephalopathy. No definite seizures  were seen throughout the recording.  Shameek Nyquist Annabelle Harman

## 2023-07-01 NOTE — Assessment & Plan Note (Signed)
-  Holding home medication of metformin -Checking CBG every 4 hours with SSI coverage

## 2023-07-01 NOTE — ED Notes (Signed)
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 Note  Report given to:   Additional Notes: NIH of 25. Unsure what NIH is at baseline, but pt does have history of stroke with left side deficits.

## 2023-07-01 NOTE — Consult Note (Signed)
NEUROLOGY TELECONSULTATION NOTE   Date of service: July 01, 2023 Patient Name: Jacob Mcgrath. MRN:  [id number] DOB:  1955/09/20 Reason for consult: telestroke  Requesting Provider: Dr. Kennon Portela Consult Participants: myself, patient, bedside RN, telestroke RN Location of the provider: [place] Location of the patient: [place]  This consult was provided via telemedicine with 2-way video and audio communication. The patient/family was informed that care would be provided in this way and agreed to receive care in this manner.   _ _ _   _ __   _ __ _ _  __ __   _ __   __ _  History of Present Illness   This is a 67 year old gentleman with past medical history significant for dementia, diabetes, hypertension, prior stroke with residual left upper extremity greater than left lower extremity weakness who is brought in by EMS with altered mental status.  History was obtained from the unit manager where he resides at [facility] facility.  Last known well was 7 PM yesterday.  At 7 AM they noticed that he was less responsive than usual and felt to be cold and clammy.  They thought that the right side of his mouth might be drooping a little bit and he was not talking.  At baseline he is able to ambulate short distances with a cane but requires a wheelchair for longer distances.  He is typically oriented x 1 at baseline but conversant with staff at the facility.  He does have left upper extremity weakness from prior stroke.  On my examination he had right gaze preference and was unable to cross midline to the left, left facial droop, no movement and left upper extremity or left lower extremity.  He was oriented to self only and was able to name some objects.   ROS   UTA 2/2 AMS  Past History   The following was personally reviewed:  Past Medical History:  Diagnosis Date   Dementia (HCC)    Diabetes mellitus without complication (HCC)    Hypertension    Stroke [facility])    Past Surgical  History:  Procedure Laterality Date   ABDOMINAL AORTOGRAM W/LOWER EXTREMITY N/A 10/04/2022   Procedure: ABDOMINAL AORTOGRAM W/LOWER EXTREMITY;  Surgeon: Chuck Hint, MD;  Location: [facility] INVASIVE CV LAB;  Service: Cardiovascular;  Laterality: N/A;   ABDOMINAL AORTOGRAM W/LOWER EXTREMITY N/A 10/08/2022   Procedure: ABDOMINAL AORTOGRAM W/LOWER EXTREMITY;  Surgeon: Leonie Douglas, MD;  Location: [facility] INVASIVE CV LAB;  Service: Cardiovascular;  Laterality: N/A;   PERIPHERAL VASCULAR INTERVENTION  10/04/2022   Procedure: PERIPHERAL VASCULAR INTERVENTION;  Surgeon: Chuck Hint, MD;  Location: [facility] INVASIVE CV LAB;  Service: Cardiovascular;;   PERIPHERAL VASCULAR INTERVENTION Left 10/08/2022   Procedure: PERIPHERAL VASCULAR INTERVENTION;  Surgeon: Leonie Douglas, MD;  Location: [facility] INVASIVE CV LAB;  Service: Cardiovascular;  Laterality: Left;   TONSILLECTOMY     History reviewed. No pertinent family history. Social History   Socioeconomic History   Marital status: Single    Spouse name: Not on file   Number of children: 1   Years of education: Not on file   Highest education level: Not on file  Occupational History   Not on file  Tobacco Use   Smoking status: Every Day    Current packs/day: 1.00    Types: Cigarettes   Smokeless tobacco: Not on file  Vaping Use   Vaping status: Never Used  Substance and Sexual Activity   Alcohol use: Not Currently  Drug use: Not Currently   Sexual activity: Not on file  Other Topics Concern   Not on file  Social History Narrative   Are you right handed or left handed? Right handed    Do you live at home alone? Lives in family care home           Social Determinants of Health   Financial Resource Strain: Not on file  Food Insecurity: No Food Insecurity (07/01/2023)   Hunger Vital Sign    Worried About Running Out of Food in the Last Year: Never true    Ran Out of Food in the Last Year: Never true  Transportation Needs: No  Transportation Needs (07/01/2023)   PRAPARE - Administrator, Civil Service (Medical): No    Lack of Transportation (Non-Medical): No  Physical Activity: Not on file  Stress: Not on file  Social Connections: Not on file   Allergies  Allergen Reactions   Amoxicillin Other (See Comments)    Unknown reaction per facility   Ampicillin Other (See Comments)    Unknown reaction per facility   Tetracyclines & Related Rash    Medications   (Not in a hospital admission)     Current Facility-Administered Medications:    0.9 %  sodium chloride infusion, , Intravenous, Continuous, Shahmehdi, Seyed A, MD   0.9 %  sodium chloride infusion, 250 mL, Intravenous, PRN, Shahmehdi, Seyed A, MD   acetaminophen (TYLENOL) tablet 650 mg, 650 mg, Oral, Q6H PRN **OR** acetaminophen (TYLENOL) suppository 650 mg, 650 mg, Rectal, Q6H PRN, Shahmehdi, Seyed A, MD   aspirin tablet 325 mg, 325 mg, Oral, Once, Rondel Baton, MD   atorvastatin (LIPITOR) tablet 80 mg, 80 mg, Oral, QHS, Shahmehdi, Seyed A, MD   bisacodyl (DULCOLAX) EC tablet 5 mg, 5 mg, Oral, Daily PRN, Shahmehdi, Seyed A, MD   clopidogrel (PLAVIX) tablet 300 mg, 300 mg, Oral, Once, Rondel Baton, MD   haloperidol lactate (HALDOL) injection 2 mg, 2 mg, Intravenous, Q6H PRN, Shahmehdi, Seyed A, MD   heparin injection 5,000 Units, 5,000 Units, Subcutaneous, Q8H, Shahmehdi, Seyed A, MD   HYDROmorphone (DILAUDID) injection 0.5-1 mg, 0.5-1 mg, Intravenous, Q2H PRN, Shahmehdi, Seyed A, MD   insulin aspart (novoLOG) injection 0-6 Units, 0-6 Units, Subcutaneous, TID WC, Shahmehdi, Seyed A, MD   ipratropium (ATROVENT) nebulizer solution 0.5 mg, 0.5 mg, Nebulization, Q6H PRN, Shahmehdi, Seyed A, MD   LORazepam (ATIVAN) injection 1 mg, 1 mg, Intravenous, Q6H PRN, Shahmehdi, Seyed A, MD   midodrine (PROAMATINE) tablet 5 mg, 5 mg, Oral, TID WC, Shahmehdi, Seyed A, MD   nicotine (NICODERM CQ - dosed in mg/24 hours) patch 14 mg, 14 mg,  Transdermal, Daily, Shahmehdi, Seyed A, MD   ondansetron (ZOFRAN) tablet 4 mg, 4 mg, Oral, Q6H PRN **OR** ondansetron (ZOFRAN) injection 4 mg, 4 mg, Intravenous, Q6H PRN, Shahmehdi, Seyed A, MD   oxyCODONE (Oxy IR/ROXICODONE) immediate release tablet 5 mg, 5 mg, Oral, Q4H PRN, Shahmehdi, Seyed A, MD   pantoprazole (PROTONIX) injection 40 mg, 40 mg, Intravenous, Q24H, Shahmehdi, Seyed A, MD   senna-docusate (Senokot-S) tablet 1 tablet, 1 tablet, Oral, QHS PRN, Shahmehdi, Seyed A, MD   sodium chloride flush (NS) 0.9 % injection 3 mL, 3 mL, Intravenous, Q12H, Shahmehdi, Seyed A, MD   sodium chloride flush (NS) 0.9 % injection 3 mL, 3 mL, Intravenous, Q12H, Shahmehdi, Seyed A, MD   sodium chloride flush (NS) 0.9 % injection 3 mL, 3 mL, Intravenous, PRN, Shahmehdi, Seyed  A, MD   sodium phosphate (FLEET) enema 1 enema, 1 enema, Rectal, Once PRN, Shahmehdi, Seyed A, MD  Current Outpatient Medications:    acetaminophen (TYLENOL) 650 MG CR tablet, Take 650 mg by mouth every 8 (eight) hours as needed for pain or fever., Disp: , Rfl:    ASPIRIN ADULT LOW STRENGTH 81 MG tablet, Take 1 tablet (81 mg total) by mouth daily with breakfast., Disp: 30 tablet, Rfl: 12   atorvastatin (LIPITOR) 80 MG tablet, Take 80 mg by mouth at bedtime., Disp: , Rfl:    CALCIUM-MAGNESIUM PO, Take 1 tablet by mouth daily., Disp: , Rfl:    clopidogrel (PLAVIX) 75 MG tablet, Take 1 tablet (75 mg total) by mouth daily with breakfast., Disp: , Rfl:    Collagenase (SANTYL EX), Apply 1 Application topically daily. Apply nickel thick amount to ulcer bed with saline wet to dry dressings once daily., Disp: , Rfl:    DULoxetine (CYMBALTA) 30 MG capsule, Take 30 mg by mouth daily., Disp: , Rfl:    gabapentin (NEURONTIN) 300 MG capsule, Take 300 mg by mouth 3 (three) times daily., Disp: , Rfl:    levETIRAcetam (KEPPRA) 500 MG tablet, Take 500 mg by mouth 2 (two) times daily., Disp: , Rfl:    LORazepam (ATIVAN) 1 MG tablet, Take 1 tablet (1 mg  total) by mouth every 12 (twelve) hours as needed for anxiety., Disp: 12 tablet, Rfl: 0   melatonin 3 MG TABS tablet, Take 2 tablets (6 mg total) by mouth at bedtime., Disp: 90 tablet, Rfl: 1   Menthol, Topical Analgesic, (BIOFREEZE EX), Apply 1 Application topically 2 (two) times daily. Apply to affected pain areas, Disp: , Rfl:    metFORMIN (GLUCOPHAGE-XR) 500 MG 24 hr tablet, Take 500 mg by mouth 2 (two) times daily., Disp: , Rfl:    metoprolol tartrate (LOPRESSOR) 25 MG tablet, Take 1 tablet (25 mg total) by mouth 2 (two) times daily., Disp: , Rfl:    midodrine (PROAMATINE) 5 MG tablet, Take 1 tablet (5 mg total) by mouth 3 (three) times daily with meals., Disp: 90 tablet, Rfl: 1   MUCINEX MAXIMUM STRENGTH 1200 MG TB12, Take 1 tablet by mouth 2 (two) times daily as needed (cough/congestion)., Disp: , Rfl:    nicotine (NICODERM CQ - DOSED IN MG/24 HOURS) 14 mg/24hr patch, Place 1 patch (14 mg total) onto the skin daily., Disp: 28 patch, Rfl: 0   pantoprazole (PROTONIX) 40 MG tablet, Take 1 tablet (40 mg total) by mouth daily., Disp: , Rfl:    PARoxetine (PAXIL) 10 MG tablet, Take 10 mg by mouth daily., Disp: , Rfl:    QUEtiapine (SEROQUEL) 25 MG tablet, Take 25 mg by mouth at bedtime., Disp: , Rfl:    tamsulosin (FLOMAX) 0.4 MG CAPS capsule, Take 1 capsule (0.4 mg total) by mouth daily., Disp: 30 capsule, Rfl:   Vitals   Vitals:   07/01/23 1055 07/01/23 1352  BP: (!) 150/94   Pulse: 99   Resp: 16   Temp: 98.6 F (37 C)   TempSrc: Oral   SpO2: 91%   Weight:  57.6 kg  Height:  5\' 9"  (1.753 m)     Body mass index is 18.75 kg/m.  Physical Exam   Exam performed over telemedicine with 2-way video and audio communication and with assistance of bedside RN  Physical Exam Gen: A&O x1, NAD Resp: normal WOB CV: extremities appear well-perfused  Neuro: *MS: A&O x1. Follows some single step commands *Speech: severe  dysarthria moderate aphasia *CN: PERRL 3mm, R gaze preference will not  cross midline to the L, blinks to threat on R side only, L UMN facial droop, hearing intact to voice *Motor & sensory:   Normal bulk.  No tremor, rigidity or bradykinesia. RUE and RLE drift to bed. LUE and LLE no movement to command or noxious stimuli  *Coordination:  UTA 2/2 mental status *Reflexes:  UTA 2/2 tele-exam *Gait: deferred  NIHSS  1a Level of Conscious.: 0 1b LOC Questions: 2 1c LOC Commands: 2 2 Best Gaze: 1 3 Visual: 2 4 Facial Palsy: 1 5a Motor Arm - left: 4 5b Motor Arm - Right: 2 6a Motor Leg - Left: 4 6b Motor Leg - Right: 2 7 Limb Ataxia: 0 8 Sensory: 1 9 Best Language: 1 10 Dysarthria: 2 11 Extinct. and Inatten.: 1  TOTAL: 25   Premorbid mRS = 3-4   Labs   CBC:  Recent Labs  Lab 07/01/23 1236  WBC 20.7*  NEUTROABS 16.3*  HGB 13.6  HCT 44.1  MCV 94.8  PLT 263    Basic Metabolic Panel:  Lab Results  Component Value Date   NA 138 07/01/2023   K 5.1 07/01/2023   CO2 23 07/01/2023   GLUCOSE 137 (H) 07/01/2023   BUN 23 07/01/2023   CREATININE 1.23 07/01/2023   CALCIUM 8.7 (L) 07/01/2023   GFRNONAA >60 07/01/2023   Lipid Panel:  Lab Results  Component Value Date   LDLCALC 40 10/05/2022   HgbA1c:  Lab Results  Component Value Date   HGBA1C 5.6 10/03/2022   Urine Drug Screen: No results found for: "LABOPIA", "COCAINSCRNUR", "LABBENZ", "AMPHETMU", "THCU", "LABBARB"  Alcohol Level     Component Value Date/Time   ETH <10 07/01/2023 1236    CT head wo CTA H&N CTP  1. No acute intracranial process. ASPECTS is 10. 2. No intracranial large vessel occlusion. 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 Moderate stenosis right cavernous and bilateral supraclinoid ICA and more proximal left V4 3. 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 The left vertebral artery is poorly opacified at its origin and in the left V1 and  proximal V2 segments, with improved opacification distally, likely reflecting retrograde flow to the left subclavian this territory. 4. No hemodynamically significant stenosis in the neck. 5. No evidence of infarct core or penumbra on CT perfusion. 6. Aortic Atherosclerosis (ICD10-I70.0) and Emphysema (ICD10-J43.9).  CNS imaging personally reviewed; I agree with above interpretations  MRI brain performed after stroke code personal review no acute intracranial process, remote R MCA infarct  rEEG - Sharp wave, right centro-temporal region - Continuous slow, right hemisphere - Intermittent slow, generalized - Background asymmetry, right<left  Impression   This is a 68 year old gentleman with past medical history significant for dementia, diabetes, hypertension, prior stroke with residual left upper extremity greater than left lower extremity weakness who is brought in by EMS with altered mental status.  Last known well was 7 PM yesterday.  On my examination he had right gaze preference and was unable to cross midline to the left, left facial droop, no movement and left upper extremity or left lower extremity.  He was oriented to self only and was able to name some objects. He is overall less responsive than typical and his L sided weakness is more pronounced. His MRI brain showed no e/o acute infarct but his EEG showed  right centrotemporal sharp waves which at times became rhythmic.  Recommendations   - Transfer to [facility] for cEEG - Load with 20mg /kg keppra and continue 500mg  bid - Seizure precautions - Notify neurology upon patient arrival to [facility] ______________________________________________________________________   Thank you for the opportunity to take part in the care of this patient. If you have any further questions, please contact the neurology consultation attending.  Signed,  Bing Neighbors, MD [facility] [phone]  If 7pm- 7am, please page neurology on call  as listed in AMION.  **Any copied and pasted documentation in this note was written by me in another application not billed for and pasted by me into this document.

## 2023-07-01 NOTE — Assessment & Plan Note (Addendum)
-   Admitted to telemetry for for new symptom/onset of a stroke Aphasic, with complete neglect, -Stroke workup -Presumably patient is on aspirin/Plavix and statins (?  Compliance) -Teleneurology Dr. Selina Cooley consulted -appreciate input -N.P.O. - MRI brain: -2D Echo: EJF:55 to 60%.  Grade I diastolic dysfunction    -Consulting PT/OT/speech  CT angiogram head/neck IMPRESSION: 1. No acute intracranial process. ASPECTS is 10. 2. No intracranial large vessel occlusion. 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 Moderate stenosis right cavernous and bilateral supraclinoid ICA and more proximal left V4 3. 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 The left vertebral artery is poorly opacified at its origin and in the left V1 and proximal V2 segments, with improved opacification distally, likely reflecting retrograde flow to the left subclavian this territory. 4. No hemodynamically significant stenosis in the neck. 5. No evidence of infarct core or penumbra on CT perfusion. 6. Aortic Atherosclerosis  -Chronic left-sided weakness, uses wheelchair -ambulate few steps at baseline currently unable to ambulate or speak, with right facial droop If no improvement in above symptoms with dysphagia, prognosis will remain poor  -Will allow for permissive hypertension

## 2023-07-01 NOTE — ED Provider Notes (Signed)
[place] EMERGENCY DEPARTMENT AT [facility] Provider Note   CSN: [id number] Arrival date & time: 07/01/23  1042     History  No chief complaint on file.   Jacob Jin. is a 67 y.o. male.  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  History obtained per [facility] staff.  Last known well was 7 PM yesterday.  At 7 AM they noticed that he was less responsive than usual.  Appeared to be cold and clammy.  They thought that the right side of his mouth might be drooping a bit and he was not talking.  They were concerned about a stroke.  Typically is very conversant and active and is often in a wheelchair but can ambulate short distances.  No known neurologic deficits to the but based on chart review does have left upper extremity weakness.  Patient unable to give additional history.  Typically alert and oriented x 1 at baseline.  They also noticed that he was hypoxic to 84% and placed him on 4 L nasal cannula which is new for him.       Home Medications Prior to Admission medications   Medication Sig Start Date End Date Taking? Authorizing Provider  acetaminophen (TYLENOL) 325 MG tablet Take 325 mg by mouth every 6 (six) hours as needed for mild pain (pain score 1-3).   Yes [provider]  atorvastatin (LIPITOR) 80 MG tablet Take 80 mg by mouth at bedtime. 08/14/21  Yes [provider]  Baclofen 5 MG TABS Take 1 tablet by mouth in the morning, at noon, and at bedtime. 06/16/23  Yes [provider]  clopidogrel (PLAVIX) 75 MG tablet Take 1 tablet (75 mg total) by mouth daily with breakfast. 10/11/22  Yes Leroy Sea, MD  DULoxetine (CYMBALTA) 30 MG capsule Take 30 mg by mouth daily. 07/23/22  Yes [provider]  gabapentin (NEURONTIN) 300 MG capsule Take 300 mg  by mouth 3 (three) times daily.   Yes [provider]  guaiFENesin (MUCINEX MAXIMUM STRENGTH PO) Take 600 mg by mouth every 12 (twelve) hours as needed (cough/congestion). 08/10/22  Yes [provider]  levETIRAcetam (KEPPRA) 500 MG tablet Take 500 mg by mouth 2 (two) times daily. 07/23/22  Yes [provider]  melatonin 3 MG TABS tablet Take 2 tablets (6 mg total) by mouth at bedtime. 03/03/23  Yes Emokpae, Courage, MD  Menthol, Topical Analgesic, (BIOFREEZE EX) Apply 1 Application topically at bedtime.   Yes [provider]  metoprolol tartrate (LOPRESSOR) 25 MG tablet Take 1 tablet (25 mg total) by mouth 2 (two) times daily. 10/11/22  Yes Leroy Sea, MD  midodrine (PROAMATINE) 5 MG tablet Take 1 tablet (5 mg total) by mouth 3 (three) times daily with meals. 03/03/23  Yes Shon Hale, MD  Multiple Vitamin (MULTIVITAMIN) tablet Take 1 tablet by mouth daily.   Yes [provider]  pantoprazole (PROTONIX) 40 MG tablet Take 1 tablet (40 mg total) by mouth daily. 10/11/22  Yes Leroy Sea, MD  PARoxetine (PAXIL) 40 MG tablet Take 40 mg by mouth every morning. 06/26/23  Yes [provider]  tamsulosin (FLOMAX) 0.4 MG CAPS capsule Take 1 capsule (0.4 mg total) by mouth daily. 10/11/22  Yes Leroy Sea, MD      Allergies    Amoxicillin, Ampicillin, and  Tetracyclines & related    Review of Systems   Review of Systems  Physical Exam Updated Vital Signs BP 118/89 (BP Location: Right Arm)   Pulse 85   Temp 98.3 F (36.8 C) (Oral)   Resp 18   Ht 5\' 9"  (1.753 m)   Wt 57.6 kg   SpO2 99%   BMI 18.75 kg/m  Physical Exam Vitals and nursing note reviewed.  Constitutional:      General: He is not in acute distress.    Appearance: He is well-developed.     Comments: Alert and oriented to self only.  Appears to be protecting his airway.  HENT:     Head: Normocephalic and atraumatic.     Right Ear: External ear normal.     Left  Ear: External ear normal.     Nose: Nose normal.  Eyes:     Conjunctiva/sclera: Conjunctivae normal.     Pupils: Pupils are equal, round, and reactive to light.     Comments: Right gaze deviation  Cardiovascular:     Rate and Rhythm: Normal rate and regular rhythm.     Heart sounds: Normal heart sounds.  Pulmonary:     Effort: Pulmonary effort is normal. No respiratory distress.     Breath sounds: Normal breath sounds.     Comments: On 2 L nasal cannula Musculoskeletal:     Cervical back: Normal range of motion and neck supple.  Skin:    General: Skin is warm and dry.  Neurological:     Comments: Patient has some difficulty complying with exam.  Alert and oriented to self only.  No appreciable facial droop.  Right gaze preference.  Has a sensory and visual neglect.  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  Patient not compliant with lower extremity exam.  Psychiatric:        Mood and Affect: Mood normal.        Behavior: Behavior normal.     ED Results / Procedures / Treatments   Labs (all labs ordered are listed, but only abnormal results are displayed) Labs Reviewed  CBC - Abnormal; Notable for the following components:      Result Value   WBC 20.7 (*)    All other components within normal limits  DIFFERENTIAL - Abnormal; Notable for the following components:   Neutro Abs 16.3 (*)    Monocytes Absolute 2.5 (*)    Abs Immature Granulocytes 0.15 (*)    All other components within normal limits  COMPREHENSIVE METABOLIC PANEL - Abnormal; Notable for the following components:   Glucose, Bld 137 (*)    Calcium 8.7 (*)    AST 53 (*)    All other components within normal limits  URINALYSIS, ROUTINE W REFLEX MICROSCOPIC - Abnormal; Notable for the following components:   APPearance HAZY (*)    Specific Gravity, Urine >1.046 (*)    Hgb urine dipstick SMALL (*)    Protein, ur 100 (*)    Leukocytes,Ua LARGE (*)    Bacteria, UA RARE (*)    All other  components within normal limits  PHOSPHORUS - Abnormal; Notable for the following components:   Phosphorus 5.2 (*)    All other components within normal limits  PROTIME-INR - Abnormal; Notable for the following components:   Prothrombin Time 15.8 (*)    All other components within normal limits  LACTIC ACID, PLASMA - Abnormal; Notable for the following components:   Lactic Acid, Venous 2.4 (*)  All other components within normal limits  GLUCOSE, CAPILLARY - Abnormal; Notable for the following components:   Glucose-Capillary 124 (*)    All other components within normal limits  GLUCOSE, CAPILLARY - Abnormal; Notable for the following components:   Glucose-Capillary 160 (*)    All other components within normal limits  CBG MONITORING, ED - Abnormal; Notable for the following components:   Glucose-Capillary 123 (*)    All other components within normal limits  RESP PANEL BY RT-PCR (RSV, FLU A&B, COVID)  RVPGX2  ETHANOL  RAPID URINE DRUG SCREEN, HOSP PERFORMED  MAGNESIUM  TSH  PROCALCITONIN  HEMOGLOBIN A1C  BASIC METABOLIC PANEL  CBC  PROTIME-INR  APTT    EKG None  Radiology EEG adult  Result Date: 07/01/2023 Charlsie Quest, MD     07/01/2023  5:21 PM Patient Name: Jacob Rack. MRN: [id number] Epilepsy Attending: Charlsie Quest Referring Physician/Provider: Kendell Bane, MD Date: 07/01/2023 Duration: 24.21 mins Patient history: 67yo M with  left sided weakness getting eeg to evaluate for seizure Level of alertness: Awake AEDs during EEG study: None Technical aspects: This EEG study was done with scalp electrodes positioned according to the 10-20 International system of electrode placement. Electrical activity was reviewed with band pass filter of 1-70Hz , sensitivity of 7 uV/mm, display speed of 97mm/sec with a 60Hz  notched filter applied as appropriate. EEG data were recorded continuously and digitally stored.  Video monitoring was available and reviewed as  appropriate. Description: The posterior dominant rhythm consists of 8 Hz activity of moderate voltage (25-35 uV) seen predominantly in posterior head regions, asymmetric ( right<left) and reactive to eye opening and eye closing. EEG showed continuous 3 to 6 Hz theta-delta slowing in right hemisphere. Sharp waves were noted in right centro-temporal region. Intermittent generalized 3-5hz  theta-delta slowing was also noted. Hyperventilation and photic stimulation were not performed.   Of note, parts of study were difficult to interpret due to significant myogenic artifact. ABNORMALITY - Sharp wave, right centro-temporal region - Continuous slow, right hemisphere - Intermittent slow, generalized - Background asymmetry, right<left IMPRESSION: This technically difficult study showed evidence of epileptogenicity arising from right centro-temporal region. Additionally there is cortical dysfunction arising from right hemisphere likely secondary to underlying structural abnormality, post-ictal state. Lastly there was  mild diffuse encephalopathy. No definite seizures  were seen throughout the recording. Charlsie Quest   MR BRAIN WO CONTRAST  Result Date: 07/01/2023 CLINICAL DATA:  Altered mental status, neglect, stroke suspected EXAM: MRI HEAD WITHOUT CONTRAST TECHNIQUE: Multiplanar, multiecho pulse sequences of the brain and surrounding structures were obtained without intravenous contrast. COMPARISON:  01/03/2023 MRI head, correlation is also made with 07/01/2023 CT head FINDINGS: Brain: No restricted diffusion to suggest acute or subacute infarct. No acute hemorrhage, mass, mass effect, or midline shift. No hydrocephalus or extra-axial collection. Craniocervical junction within normal limits. Hemosiderin deposition in the right frontal and parietal lobes, associated with an area of encephalomalacia, likely petechial hemorrhage from a remote MCA territory infarct. Advanced cerebral volume loss for age, with ex vacuo  dilatation of the ventricles. Ventricles and sulci are commensurate in size Vascular: Loss of the proximal left V4 flow void (series 10, image 5), which is new from the prior MRI but may correlate with an area of stenosis on the same-day CTA. Otherwise normal flow voids. Skull and upper cervical spine: Normal marrow signal. Sinuses/Orbits: Mucous retention cyst in the right maxillary sinus and posterior right ethmoid air cells. No acute finding in the orbits.  Other: The mastoid air cells are well aerated. IMPRESSION: No acute intracranial process. No evidence of acute or subacute infarct. Electronically Signed   By: Wiliam Ke M.D.   On: 07/01/2023 16:13   ECHOCARDIOGRAM COMPLETE  Result Date: 07/01/2023    ECHOCARDIOGRAM REPORT   Patient Name:   Jacob Schmoldt. Date of Exam: 07/01/2023 Medical Rec #:  [id number]          Height:       69.0 in Accession #:    [id number]         Weight:       127.0 lb Date of Birth:  [birth year]         BSA:          1.703 m Patient Age:    59 years           BP:           165/90 mmHg Patient Gender: M                  HR:           96 bpm. [facility] Procedure: 2D Echo, Color Doppler, Cardiac Doppler and Intracardiac            Opacification Agent Indications:    Stroke I63.9  History:        Patient has no prior history of Echocardiogram examinations.                 Stroke; Risk Factors:Hypertension and Diabetes.  Sonographer:    Celesta Gentile RCS Referring Phys: [phone] SEYED A SHAHMEHDI IMPRESSIONS  1. Left ventricular ejection fraction, by estimation, is 55 to 60%. The left ventricle has normal function. Left ventricular endocardial border not optimally defined to evaluate regional wall motion - basal septum and basal posterior wall dyssynchronous  but contractile. Left ventricular diastolic parameters are consistent with Grade I diastolic dysfunction (impaired relaxation).  2. Definity contrast shows no LV mural thrombus.  3. Right ventricular systolic  function is low normal. The right ventricular size is normal. There is normal pulmonary artery systolic pressure. The estimated right ventricular systolic pressure is 29.6 mmHg.  4. The mitral valve is degenerative. Trivial mitral valve regurgitation.  5. The aortic valve has an indeterminant number of cusps. There is moderate calcification of the aortic valve. Aortic valve regurgitation is not visualized. Aortic valve sclerosis/calcification is present, without any evidence of aortic stenosis.  6. The inferior vena cava is normal in size with greater than 50% respiratory variability, suggesting right atrial pressure of 3 mmHg. Comparison(s): No prior Echocardiogram. FINDINGS  Left Ventricle: Left ventricular ejection fraction, by estimation, is 55 to 60%. The left ventricle has normal function. Left ventricular endocardial border not optimally defined to evaluate regional wall motion. Definity contrast agent was given IV to delineate the left ventricular endocardial borders. The left ventricular internal cavity size was normal in size. There is no left ventricular hypertrophy. Left ventricular diastolic parameters are consistent with Grade I diastolic dysfunction (impaired relaxation). Right Ventricle: The right ventricular size is normal. No increase in right ventricular wall thickness. Right ventricular systolic function is low normal. There is normal pulmonary artery systolic pressure. The tricuspid regurgitant velocity is 2.58 m/s,  and with an assumed right atrial pressure of 3 mmHg, the estimated right ventricular systolic pressure is 29.6 mmHg. Left Atrium: Left atrial size was normal in size. Right Atrium: Right atrial size was normal in size. Pericardium: There is  no evidence of pericardial effusion. Mitral Valve: The mitral valve is degenerative in appearance. There is mild calcification of the mitral valve leaflet(s). Trivial mitral valve regurgitation. Tricuspid Valve: The tricuspid valve is grossly  normal. Tricuspid valve regurgitation is mild. Aortic Valve: The aortic valve has an indeterminant number of cusps. There is moderate calcification of the aortic valve. There is mild to moderate aortic valve annular calcification. Aortic valve regurgitation is not visualized. Aortic valve sclerosis/calcification is present, without any evidence of aortic stenosis. Pulmonic Valve: The pulmonic valve was not well visualized. Pulmonic valve regurgitation is trivial. Aorta: The aortic root is normal in size and structure. Venous: The inferior vena cava is normal in size with greater than 50% respiratory variability, suggesting right atrial pressure of 3 mmHg. IAS/Shunts: No atrial level shunt detected by color flow Doppler.  LEFT VENTRICLE PLAX 2D LVIDd:         4.10 cm   Diastology LVIDs:         3.70 cm   LV e' medial:    5.44 cm/s LV PW:         0.90 cm   LV E/e' medial:  11.0 LV IVS:        0.80 cm   LV e' lateral:   8.49 cm/s LVOT diam:     1.80 cm   LV E/e' lateral: 7.0 LV SV:         39 LV SV Index:   23 LVOT Area:     2.54 cm  RIGHT VENTRICLE RV S prime:     11.20 cm/s TAPSE (M-mode): 2.0 cm LEFT ATRIUM             Index        RIGHT ATRIUM           Index LA diam:        2.90 cm 1.70 cm/m   RA Area:     17.00 cm LA Vol (A2C):   42.4 ml 24.89 ml/m  RA Volume:   52.50 ml  30.82 ml/m LA Vol (A4C):   44.9 ml 26.36 ml/m LA Biplane Vol: 46.2 ml 27.12 ml/m  AORTIC VALVE LVOT Vmax:   95.30 cm/s LVOT Vmean:  62.600 cm/s LVOT VTI:    0.152 m  AORTA Ao Root diam: 2.70 cm MITRAL VALVE                TRICUSPID VALVE MV Area (PHT): 4.31 cm     TR Peak grad:   26.6 mmHg MV Decel Time: 176 msec     TR Vmax:        258.00 cm/s MV E velocity: 59.60 cm/s MV A velocity: 107.00 cm/s  SHUNTS MV E/A ratio:  0.56         Systemic VTI:  0.15 m                             Systemic Diam: 1.80 cm Nona Dell MD Electronically signed by Nona Dell MD Signature Date/Time: 07/01/2023/3:14:04 PM    Final    DG Abd 1  View  Result Date: 07/01/2023 CLINICAL DATA:  MRI clearance EXAM: ABDOMEN - 1 VIEW COMPARISON:  X-ray 02/28/2023 FINDINGS: Scattered colonic stool. Gas seen in nondilated loops of small and large bowel. No obstruction. Degenerative changes seen of the spine and pelvis particularly of the right hip. Left hip arthroplasty seen at the edge of the imaging field, incompletely included. There  is contrast in the renal collecting systems including the bladder which appears to has a trabeculated wall with some potential bladder diverticula. There is a vascular stent overlying the left iliac region. Please correlate for time course and type of stent. Overlapping cardiac leads. IMPRESSION: Nonspecific bowel gas pattern with scattered stool. Left hip prosthesis.  Degenerative changes. Left iliac vascular stents. Please correlate with the type and time of placement of stent. Please correlate for compatibility for MRI Electronically Signed   By: Karen Kays M.D.   On: 07/01/2023 14:47   DG Chest Portable 1 View  Result Date: 07/01/2023 CLINICAL DATA:  Hypoxia. EXAM: PORTABLE CHEST 1 VIEW COMPARISON:  02/27/2023. FINDINGS: Redemonstration of increased interstitial markings throughout bilateral lungs, grossly similar to the prior study. No frank pulmonary edema. There are heterogeneous opacities in the right paracardiac region. Differential diagnosis includes combination of vascular/interstitial markings versus superimposed atelectasis/pneumonitis. Correlate clinically. Bilateral lung fields are otherwise clear. Bilateral costophrenic angles are clear. Normal cardio-mediastinal silhouette. No acute osseous abnormalities. The soft tissues are within normal limits. IMPRESSION: *Heterogeneous opacities in the right paracardiac region, as discussed above. Electronically Signed   By: Jules Schick M.D.   On: 07/01/2023 13:56   CT HEAD CODE STROKE WO CONTRAST  Result Date: 07/01/2023 CLINICAL DATA:  Right gaze deviation,  altered mental status, stroke suspected EXAM: CT ANGIOGRAPHY HEAD AND NECK CT PERFUSION BRAIN TECHNIQUE: Multidetector CT imaging of the head and neck was performed using the standard protocol during bolus administration of intravenous contrast. Multiplanar CT image reconstructions and MIPs were obtained to evaluate the vascular anatomy. Carotid stenosis measurements (when applicable) are obtained utilizing NASCET criteria, using the distal internal carotid diameter as the denominator. Multiphase CT imaging of the brain was performed following IV bolus contrast injection. Subsequent parametric perfusion maps were calculated using RAPID software. RADIATION DOSE REDUCTION: This exam was performed according to the departmental dose-optimization program which includes automated exposure control, adjustment of the mA and/or kV according to patient size and/or use of iterative reconstruction technique. CONTRAST:  OMNIPAQUE IOHEXOL 350 MG/ML SOLN COMPARISON:  02/27/2023 CT head FINDINGS: CT HEAD FINDINGS Brain: No evidence of acute infarction, hemorrhage, mass, mass effect, or midline shift. No hydrocephalus or extra-axial collection. 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 Partial empty sella. Normal craniocervical junction. Periventricular white matter changes, likely the sequela of chronic small vessel ischemic disease. Vascular: No hyperdense vessel. Skull: Negative for fracture or focal lesion. Sinuses/Orbits: No acute finding. Other: The mastoid air cells are well aerated. ASPECTS [facility] Stroke Program Early CT Score) - Ganglionic level infarction (caudate, lentiform nuclei, internal capsule, insula, M1-M3 cortex): 7 - Supraganglionic infarction (M4-M6 cortex): 3 Total score (0-10 with 10 being normal): 10 CTA NECK FINDINGS Aortic arch: Standard branching. Imaged portion shows no evidence of aneurysm or dissection. 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 Aortic atherosclerosis. Multifocal stenosis in the right subclavian artery is not hemodynamically significant (series 6, image 280). Right carotid system: No evidence of dissection, occlusion, or hemodynamically significant stenosis (greater than 50%). Atherosclerotic disease in the common carotid, at the bifurcation, and in the proximal ICA is not hemodynamically significant. Left carotid system: No evidence of dissection, occlusion, or hemodynamically significant stenosis (greater than 50%). Atherosclerotic  disease in the common carotid, at the bifurcation, and in the proximal ICA is not hemodynamically significant. Vertebral arteries: The left vertebral artery is poorly opacified at its origin and in the left V1 and proximal V2 segments, with improved opacification distally, likely reflecting retrograde flow. The right vertebral artery is patent from its origin to the skull base, without significant stenosis or evidence of dissection. Skeleton: No acute osseous abnormality. Degenerative changes in the cervical spine. Other neck: No acute finding. Upper chest: No focal pulmonary opacity or pleural effusion. Emphysema. Review of the MIP images confirms the above findings CTA HEAD FINDINGS Anterior circulation: Both internal carotid arteries are patent to the termini, with severe stenosis in the distal left cavernous ICA (series 6, image 119), and moderate stenosis in the right cavernous and bilateral supraclinoid ICA. A1 segments patent, hypoplastic on the left. Normal anterior communicating artery. Azygous A2. Anterior cerebral arteries are patent to their distal aspects without significant stenosis. No M1 stenosis or occlusion. MCA branches perfused to their distal aspects  without significant stenosis. Posterior circulation: Vertebral arteries patent to the vertebrobasilar junction with moderate stenosis in the proximal left V4 (series 6, image 147) and severe stenosis near the vertebrobasilar junction (series 6, image 130 posterior inferior cerebellar arteries patent proximally. Basilar patent to its distal aspect without significant stenosis. Superior cerebellar arteries patent proximally. Patent P1 segments. PCAs perfused to their distal aspects without significant stenosis. The bilateral posterior communicating arteries are patent. Venous sinuses: As permitted by contrast timing, patent. Anatomic variants: None significant. No evidence of aneurysm or vascular malformation. Review of the MIP images confirms the above findings CT Brain Perfusion Findings: ASPECTS: 10 CBF (<30%) Volume: 0mL Perfusion (Tmax>6.0s) volume: 0mL Mismatch Volume: 0mL Infarction Location:None IMPRESSION: 1. No acute intracranial process. ASPECTS is 10. 2. No intracranial large vessel occlusion. 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 Moderate stenosis right cavernous and bilateral supraclinoid ICA and more proximal left V4 3. 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 The left vertebral artery is poorly opacified at its origin and in the left V1 and proximal V2 segments, with improved opacification distally, likely reflecting retrograde flow to the left subclavian this territory. 4. No hemodynamically significant stenosis in the neck. 5. No evidence of infarct core or penumbra on CT perfusion. 6. Aortic Atherosclerosis (ICD10-I70.0) and Emphysema (ICD10-J43.9). Code stroke imaging results were communicated on 07/01/2023 at 12:19 pm to provider Eloise Harman via telephone, who verbally acknowledged these results. Electronically Signed   By: Wiliam Ke M.D.   On: 07/01/2023 12:33   CT ANGIO HEAD  NECK W WO CM W PERF (CODE STROKE)  Result Date: 07/01/2023 CLINICAL DATA:  Right gaze deviation, altered mental status, stroke suspected EXAM: CT ANGIOGRAPHY HEAD AND NECK CT PERFUSION BRAIN TECHNIQUE: Multidetector CT imaging of the head and neck was performed using the standard protocol during bolus administration of intravenous contrast. Multiplanar CT image reconstructions and MIPs were obtained to evaluate the vascular anatomy. Carotid stenosis measurements (when applicable) are obtained utilizing NASCET criteria, using the distal internal carotid diameter as the denominator. Multiphase CT imaging of the brain was performed following IV bolus contrast injection. Subsequent parametric perfusion maps were calculated using RAPID software. RADIATION DOSE REDUCTION: This exam was performed according to the departmental dose-optimization program which includes automated exposure control, adjustment of the mA and/or kV according to patient size and/or use of iterative reconstruction technique. CONTRAST:  OMNIPAQUE IOHEXOL 350 MG/ML SOLN COMPARISON:  02/27/2023 CT head FINDINGS: CT HEAD FINDINGS Brain: No evidence of acute infarction, hemorrhage, mass, mass effect, or midline shift. No hydrocephalus or extra-axial collection. 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 Partial empty sella. Normal craniocervical junction. Periventricular white matter changes, likely the sequela of chronic small vessel ischemic disease. Vascular: No hyperdense vessel. Skull: Negative for fracture or focal lesion. Sinuses/Orbits: No acute finding. Other: The mastoid air cells are well aerated. ASPECTS [facility] Stroke Program Early CT Score) - Ganglionic level infarction (caudate, lentiform nuclei, internal capsule, insula, M1-M3 cortex): 7 - Supraganglionic infarction (M4-M6 cortex): 3 Total score (0-10 with 10 being normal): 10 CTA NECK FINDINGS Aortic  arch: Standard branching. Imaged portion shows no evidence of aneurysm or dissection. 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 Aortic atherosclerosis. Multifocal stenosis in the right subclavian artery is not hemodynamically significant (series 6, image 280). Right carotid system: No evidence of dissection, occlusion, or hemodynamically significant stenosis (greater than 50%). Atherosclerotic disease in the common carotid, at the bifurcation, and in the proximal ICA is not hemodynamically significant. Left carotid system: No evidence of dissection, occlusion, or hemodynamically significant stenosis (greater than 50%). Atherosclerotic disease in the common carotid, at the bifurcation, and in the proximal ICA is not hemodynamically significant. Vertebral arteries: The left vertebral artery is poorly opacified at its origin and in the left V1 and proximal V2 segments, with improved opacification distally, likely reflecting retrograde flow. The right vertebral artery is patent from its origin to the skull base, without significant stenosis or evidence of dissection. Skeleton: No acute osseous abnormality. Degenerative changes in the cervical spine. Other neck: No acute finding. Upper chest: No focal pulmonary opacity or pleural effusion. Emphysema. Review of the MIP images confirms the above findings CTA HEAD FINDINGS Anterior circulation: Both internal carotid arteries are patent to the termini, with severe stenosis in the distal left cavernous ICA (series 6, image 119), and moderate stenosis in the right cavernous and bilateral supraclinoid ICA. A1 segments patent, hypoplastic on the left. Normal anterior communicating artery. Azygous A2. Anterior cerebral arteries are patent to their distal aspects  without significant stenosis. No M1 stenosis or occlusion. MCA branches perfused to their distal aspects without significant stenosis. Posterior circulation: Vertebral arteries patent to the vertebrobasilar junction with moderate stenosis in the proximal left V4 (series 6, image 147) and severe stenosis near the vertebrobasilar junction (series 6, image 130 posterior inferior cerebellar arteries patent proximally. Basilar patent to its distal aspect without significant stenosis. Superior cerebellar arteries patent proximally. Patent P1 segments. PCAs perfused to their distal aspects without significant stenosis. The bilateral posterior communicating arteries are patent. Venous sinuses: As permitted by contrast timing, patent. Anatomic variants: None significant. No evidence of aneurysm or vascular malformation. Review of the MIP images confirms the above findings CT Brain Perfusion Findings: ASPECTS: 10 CBF (<30%) Volume: 0mL Perfusion (Tmax>6.0s) volume: 0mL Mismatch Volume: 0mL Infarction Location:None IMPRESSION: 1. No acute intracranial process. ASPECTS is 10. 2. No intracranial large vessel occlusion. 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 Moderate stenosis right cavernous and bilateral supraclinoid ICA and more proximal left V4 3. 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 The left vertebral artery is poorly opacified at its origin and in the left V1 and proximal V2 segments, with improved opacification distally, likely reflecting retrograde flow to the left subclavian this territory. 4. No hemodynamically significant stenosis in the neck. 5. No evidence of infarct core or penumbra on CT perfusion. 6. Aortic Atherosclerosis (ICD10-I70.0) and Emphysema (ICD10-J43.9). Code stroke imaging results were communicated on 07/01/2023 at 12:19 pm to provider Eloise Harman via telephone, who verbally acknowledged  these results. Electronically Signed   By: Wiliam Ke M.D.   On: 07/01/2023 12:33    Procedures Procedures    Medications Ordered in ED Medications  heparin injection 5,000 Units (has no administration in time range)  sodium chloride flush (NS) 0.9 % injection 3 mL (has no administration in time range)  0.9 %  sodium chloride infusion ( Intravenous New Bag/Given 07/01/23 1625)  sodium chloride flush (NS) 0.9 % injection 3 mL (has no administration in time range)  sodium chloride flush (NS) 0.9 % injection 3 mL (has no administration in time range)  0.9 %  sodium chloride infusion (has no administration in time range)  acetaminophen (TYLENOL) tablet 650 mg (has no administration in time range)    Or  acetaminophen (TYLENOL) suppository 650 mg (has no administration in time range)  oxyCODONE (Oxy IR/ROXICODONE) immediate release tablet 5 mg (has no administration in time range)  HYDROmorphone (DILAUDID) injection 0.5-1 mg (has no administration in time range)  senna-docusate (Senokot-S) tablet 1 tablet (has no administration in time range)  bisacodyl (DULCOLAX) EC tablet 5 mg (has no administration in time range)  sodium phosphate (FLEET) enema 1 enema (has no administration in time range)  ondansetron (ZOFRAN) tablet 4 mg (has no administration in time range)    Or  ondansetron (ZOFRAN) injection 4 mg (has no administration in time range)  ipratropium (ATROVENT) nebulizer solution 0.5 mg (has no administration in time range)  atorvastatin (LIPITOR) tablet 80 mg (has no administration in time range)  insulin aspart (novoLOG) injection 0-6 Units ( Subcutaneous Not Given 07/01/23 1658)  pantoprazole (PROTONIX) injection 40 mg (40 mg Intravenous Patient Refused/Not Given 07/01/23 1750)  haloperidol lactate (HALDOL) injection 2 mg (has no administration in time range)  nicotine (NICODERM CQ - dosed in mg/24 hours) patch 14 mg (14 mg Transdermal Patch Applied 07/01/23 1658)  LORazepam (ATIVAN)  injection 1 mg (has no administration in time range)  perflutren lipid microspheres (DEFINITY) IV suspension (3 mLs Intravenous Given 07/01/23 1451)  levETIRAcetam (KEPPRA) tablet 500 mg (has no administration in time range)  levETIRAcetam (KEPPRA) 1,150 mg in sodium chloride 0.9 % 100 mL IVPB (has no administration in time range)  iohexol (OMNIPAQUE) 350 MG/ML injection 100 mL (100 mLs Intravenous Contrast Given 07/01/23 1201)  aspirin tablet 325 mg (325 mg Oral Given 07/01/23 1656)  clopidogrel (PLAVIX) tablet 300 mg (300 mg Oral Given 07/01/23 1656)    ED Course/ Medical Decision Making/ A&P Clinical Course as of 07/01/23 2136  Fri Jul 01, 2023  1214 Dr. Selina Cooley consulted from neurology.  Code stroke activated. [RP]    Clinical Course User Index [RP] Rondel Baton, MD                                 Medical Decision Making Amount and/or Complexity of Data Reviewed Labs: ordered. Radiology: ordered.  Risk OTC drugs. Prescription drug management. Decision regarding hospitalization.  Jacob Mcgrath. is a 67 y.o. 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    Initial Ddx:  Stroke, ICH, recrudescence, aspiration, pneumonia  MDM/Course:  Patient presents emergency department with altered mental status.  On my exam appears to have left-sided neglect with a right gaze preference.  Appears to have left-sided weakness as well.  Did have some difficulty initially obtaining a last known well from this facility but appears to be within the past 24 hours.  This could potentially represent an LVO or recrudescence of his stroke so code stroke was activated.  Patient had a CT angio which did not show any sign of LVO.  No brain bleed.  Was discussed with neurology and it was felt that  he likely did have a acute stroke or recrudescence.  He is awaiting an MRI of the brain this time.  With his hypoxia concerned that he may potentially aspirated in the midst of all this causing his increased oxygen requirement.  Upon re-evaluation the patient remained stable.  Was admitted to hospitalist (Dr Flossie Dibble) for further management.  Also does have a urinalysis and other labs pending at this time.  This patient presents to the ED for concern of complaints listed in HPI, this involves an extensive number of treatment options, and is a complaint that carries with it a high risk of complications and morbidity. Disposition including potential need for admission considered.   Dispo: Admit to Floor  Additional history obtained from Nursing Home/Care Facility Records reviewed Outpatient Clinic Notes The following labs were independently interpreted: CBC and show  leukocytosis suggestive of infection I independently reviewed the following imaging with scope of interpretation limited to determining acute life threatening conditions related to emergency care: CT Head and agree with the radiologist interpretation with the following exceptions: none I personally reviewed and interpreted cardiac monitoring: normal sinus rhythm  I personally reviewed and interpreted the pt's EKG: see above for interpretation  I have reviewed the patients home medications and made adjustments as needed Consults: Hospitalist and Neurology Social Determinants of health:  Elderly/SNF resident  Portions of this note were generated with Scientist, clinical (histocompatibility and immunogenetics). Dictation errors may occur despite best attempts at proofreading.     Final Clinical Impression(s) / ED Diagnoses Final diagnoses:  Stroke-like symptoms  Somnolence  Hypoxia    Rx / DC Orders ED Discharge Orders     None         Rondel Baton, MD 07/01/23 2136

## 2023-07-01 NOTE — Assessment & Plan Note (Signed)
Will monitor closely

## 2023-07-01 NOTE — Assessment & Plan Note (Signed)
-   Leukocytosis with WBC of 20.7, lymphocyte of 16.3 Afebrile, chest x-ray clear -Pending UA -Withholding antibiotics for now

## 2023-07-01 NOTE — Progress Notes (Signed)
EEG complete - results pending 

## 2023-07-01 NOTE — H&P (Signed)
History and Physical   Patient: Jacob Mcgrath.                            PCP: Benetta Spar, MD                    DOB: 07-23-1956            DOA: 07/01/2023 JXB:[id number]             DOS: 07/01/2023, 3:50 PM  Mcgrath, Jacob Salinas, MD  Patient coming from:   HOME  I have personally reviewed patient's medical records, in electronic medical records, including:  Jacob Mcgrath link, and care everywhere.   Patient presenting [name]  Chief Complaint:   Altered mental status,   History of present illness:    Jacob Mcgrath. is a 67 year old male from SNF with a significant history of DM 2, neuropathy, HTN, HDL, GERD, depression, anxiety, PVD, ambulates with cane, with ischemic limb, he is chronically on aspirin and Plavix with history of previous stroke contracted left hand at baseline. Presented today to the ED from [facility] change in mental status.  Last noted normal 7 PM yesterday.  This morning he was less responsive.  Was also found cold and clammy.  Was drooling from right facial and mouth drooping, unable to speak.  Usually awake alert x 1 able to communicate. Patient unable to provide any further history.  Patient was also noted to briefly hypoxic on arrival satting 84% on room air.  ED evaluation: Blood pressure (!) 150/94, pulse 99, temperature 98.6 F (37 C), temperature source Oral, resp. rate 16, SpO2 91% on 4 L   Labs: CBC WBC 20.7, calcium 8.7, glucose 137,  CT angio: head and neck: IMPRESSION: 1. No acute intracranial process. ASPECTS is 10. 2. No intracranial large vessel occlusion. 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 Moderate stenosis right cavernous and bilateral supraclinoid ICA and more proximal left V4 3. 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 The left vertebral artery is poorly opacified at its origin and  in the left V1 and proximal V2 segments, with improved opacification distally, likely reflecting retrograde flow to the left subclavian this territory. 4. No hemodynamically significant stenosis in the neck. 5. No evidence of infarct core or penumbra on CT perfusion. 6. Aortic Atherosclerosis  Teleneurology Dr. Selina Mcgrath consulted Pending MRI of the brain  Requested to be admitted for new stroke and work up.   -----------------------------------------------------------------------------------------------------------------------------------------  Per record no reported Fever, Chills, Cough, SOB, Chest Pain, Abd pain, N/V/D, headache, dizziness, lightheadedness,  Dysuria, Joint pain, rash, open wounds   Review of Systems: As per HPI,   Unable to review all systems due to the inability of the patient to answer questions.  ----------------------------------------------------------------------------------------------------------------------  Allergies  Allergen Reactions   Amoxicillin Other (See Comments)    Unknown reaction per facility   Ampicillin Other (See Comments)    Unknown reaction per facility   Tetracyclines & Related Rash    Home MEDs:  Prior to Admission medications   Medication Sig Start Date End Date Taking? Authorizing Provider  acetaminophen (TYLENOL) 650 MG CR tablet Take 650 mg by mouth every 8 (eight) hours as needed for pain or fever. 08/10/22   [provider]  ASPIRIN ADULT LOW STRENGTH 81 MG tablet Take 1 tablet (81 mg  total) by mouth daily with breakfast. 03/03/23   Shon Hale, MD  atorvastatin (LIPITOR) 80 MG tablet Take 80 mg by mouth at bedtime. 08/14/21   [provider]  CALCIUM-MAGNESIUM PO Take 1 tablet by mouth daily.    [provider]  clopidogrel (PLAVIX) 75 MG tablet Take 1 tablet (75 mg total) by mouth daily with breakfast. 10/11/22   Leroy Sea, MD  Collagenase (SANTYL EX) Apply 1 Application topically daily.  Apply nickel thick amount to ulcer bed with saline wet to dry dressings once daily.    [provider]  DULoxetine (CYMBALTA) 30 MG capsule Take 30 mg by mouth daily. 07/23/22   [provider]  gabapentin (NEURONTIN) 300 MG capsule Take 300 mg by mouth 3 (three) times daily.    [provider]  levETIRAcetam (KEPPRA) 500 MG tablet Take 500 mg by mouth 2 (two) times daily. 07/23/22   [provider]  LORazepam (ATIVAN) 1 MG tablet Take 1 tablet (1 mg total) by mouth every 12 (twelve) hours as needed for anxiety. 03/03/23 03/02/24  Shon Hale, MD  melatonin 3 MG TABS tablet Take 2 tablets (6 mg total) by mouth at bedtime. 03/03/23   Shon Hale, MD  Menthol, Topical Analgesic, (BIOFREEZE EX) Apply 1 Application topically 2 (two) times daily. Apply to affected pain areas    [provider]  metFORMIN (GLUCOPHAGE-XR) 500 MG 24 hr tablet Take 500 mg by mouth 2 (two) times daily. 07/23/22   [provider]  metoprolol tartrate (LOPRESSOR) 25 MG tablet Take 1 tablet (25 mg total) by mouth 2 (two) times daily. 10/11/22   Leroy Sea, MD  midodrine (PROAMATINE) 5 MG tablet Take 1 tablet (5 mg total) by mouth 3 (three) times daily with meals. 03/03/23   Shon Hale, MD  MUCINEX MAXIMUM STRENGTH 1200 MG TB12 Take 1 tablet by mouth 2 (two) times daily as needed (cough/congestion). 08/10/22   [provider]  nicotine (NICODERM CQ - DOSED IN MG/24 HOURS) 14 mg/24hr patch Place 1 patch (14 mg total) onto the skin daily. 03/04/23   Shon Hale, MD  pantoprazole (PROTONIX) 40 MG tablet Take 1 tablet (40 mg total) by mouth daily. 10/11/22   Leroy Sea, MD  PARoxetine (PAXIL) 10 MG tablet Take 10 mg by mouth daily. 08/14/21   [provider]  QUEtiapine (SEROQUEL) 25 MG tablet Take 25 mg by mouth at bedtime. 08/14/21   [provider]  tamsulosin (FLOMAX) 0.4 MG CAPS capsule Take 1 capsule (0.4 mg total) by mouth daily.  10/11/22   Leroy Sea, MD    PRN MEDs: sodium chloride, acetaminophen **OR** acetaminophen, bisacodyl, haloperidol lactate, HYDROmorphone (DILAUDID) injection, ipratropium, LORazepam, ondansetron **OR** ondansetron (ZOFRAN) IV, oxyCODONE, perflutren lipid microspheres (DEFINITY) IV suspension, senna-docusate, sodium chloride flush, sodium phosphate  Past Medical History:  Diagnosis Date   Dementia (HCC)    Diabetes mellitus without complication (HCC)    Hypertension    Stroke [facility])     Past Surgical History:  Procedure Laterality Date   ABDOMINAL AORTOGRAM W/LOWER EXTREMITY N/A 10/04/2022   Procedure: ABDOMINAL AORTOGRAM W/LOWER EXTREMITY;  Surgeon: Chuck Hint, MD;  Location: [facility] INVASIVE CV LAB;  Service: Cardiovascular;  Laterality: N/A;   ABDOMINAL AORTOGRAM W/LOWER EXTREMITY N/A 10/08/2022   Procedure: ABDOMINAL AORTOGRAM W/LOWER EXTREMITY;  Surgeon: Leonie Douglas, MD;  Location: [facility] INVASIVE CV LAB;  Service: Cardiovascular;  Laterality: N/A;   PERIPHERAL VASCULAR INTERVENTION  10/04/2022   Procedure: PERIPHERAL VASCULAR  INTERVENTION;  Surgeon: Chuck Hint, MD;  Location: [facility] INVASIVE CV LAB;  Service: Cardiovascular;;   PERIPHERAL VASCULAR INTERVENTION Left 10/08/2022   Procedure: PERIPHERAL VASCULAR INTERVENTION;  Surgeon: Leonie Douglas, MD;  Location: [facility] INVASIVE CV LAB;  Service: Cardiovascular;  Laterality: Left;   TONSILLECTOMY       reports that he has been smoking cigarettes. He does not have any smokeless tobacco history on file. He reports that he does not currently use alcohol. He reports that he does not currently use drugs.   History reviewed. No pertinent family history.  Physical Exam:   Vitals:   07/01/23 1055 07/01/23 1352 07/01/23 1415  BP: (!) 150/94  (!) 165/90  Pulse: 99  93  Resp: 16  14  Temp: 98.6 F (37 C)    TempSrc: Oral    SpO2: 91%  97%  Weight:  57.6 kg   Height:  5\' 9"  (1.753 m)         General:  Awake,  facial droop, unable to speak, does not get engage in conversation, follow commands, no cooperative, no distress;   HEENT:  RT Facial droop, normocephalic, PERRL, otherwise with in Normal limits   Neuro:  Right facial droop, limited exam patient is not following commands   Lungs:   Clear to auscultation BL, Respirations unlabored,  No wheezes / crackles  Cardio:    S1/S2, RRR, No murmure, No Rubs or Gallops   Abdomen:  Soft, non-tender, bowel sounds active all four quadrants, no guarding or peritoneal signs.  Muscular  skeletal:  Limited exam - - in bed, not able to move extremities,   2+ pulses,  symmetric, No pitting edema  Skin:  Dry, warm to touch, negative for any Rashes,  Wounds: Please see nursing documentation  Pressure Injury 10/04/22 Sacrum Medial Stage 1 -  Intact skin with non-blanchable redness of a localized area usually over a bony prominence. (Active)  10/04/22 2011  Location: Sacrum  Location Orientation: Medial  Staging: Stage 1 -  Intact skin with non-blanchable redness of a localized area usually over a bony prominence.  Wound Description (Comments):   Present on Admission:       Pressure Injury 10/04/22 Sacrum Medial Stage 1 -  Intact skin with non-blanchable redness of a localized area usually over a bony prominence. (Active)  10/04/22 2011  Location: Sacrum  Location Orientation: Medial  Staging: Stage 1 -  Intact skin with non-blanchable redness of a localized area usually over a bony prominence.  Wound Description (Comments):   Present on Admission:       Labs on admission:    I have personally reviewed following labs and imaging studies  CBC: Recent Labs  Lab 07/01/23 1236  WBC 20.7*  NEUTROABS 16.3*  HGB 13.6  HCT 44.1  MCV 94.8  PLT 263   Basic Metabolic Panel: Recent Labs  Lab 07/01/23 1236  NA 138  K 5.1  CL 104  CO2 23  GLUCOSE 137*  BUN 23  CREATININE 1.23  CALCIUM 8.7*  MG 1.9  PHOS 5.2*   GFR: Estimated Creatinine  Clearance: 47.5 mL/min (by C-G formula based on SCr of 1.23 mg/dL). Liver Function Tests: Recent Labs  Lab 07/01/23 1236  AST 53*  ALT 25  ALKPHOS 79  BILITOT 0.5  PROT 7.1  ALBUMIN 3.5    CBG: Recent Labs  Lab 07/01/23 1134  GLUCAP 123*    Urine analysis:    Component Value Date/Time   COLORURINE STRAW (  A) 02/27/2023 1952   APPEARANCEUR CLEAR 02/27/2023 1952   LABSPEC 1.005 02/27/2023 1952   PHURINE 7.0 02/27/2023 1952   GLUCOSEU NEGATIVE 02/27/2023 1952   HGBUR NEGATIVE 02/27/2023 1952   BILIRUBINUR NEGATIVE 02/27/2023 1952   KETONESUR NEGATIVE 02/27/2023 1952   PROTEINUR NEGATIVE 02/27/2023 1952   NITRITE POSITIVE (A) 02/27/2023 1952   LEUKOCYTESUR SMALL (A) 02/27/2023 1952    Last A1C:  Lab Results  Component Value Date   HGBA1C 5.6 10/03/2022     Radiologic Exams on Admission:   ECHOCARDIOGRAM COMPLETE  Result Date: 07/01/2023    ECHOCARDIOGRAM REPORT   Patient Name:   Lawren Florer. Date of Exam: 07/01/2023 Medical Rec #:  [id number]          Height:       69.0 in Accession #:    [id number]         Weight:       127.0 lb Date of Birth:  July 23, 1956         BSA:          1.703 m Patient Age:    7 years           BP:           165/90 mmHg Patient Gender: M                  HR:           96 bpm. [facility] Procedure: 2D Echo, Color Doppler, Cardiac Doppler and Intracardiac            Opacification Agent Indications:    Stroke I63.9  History:        Patient has no prior history of Echocardiogram examinations.                 Stroke; Risk Factors:Hypertension and Diabetes.  Sonographer:    Celesta Gentile RCS Referring Phys: [phone] Yanis Larin A Travell Desaulniers IMPRESSIONS  1. Left ventricular ejection fraction, by estimation, is 55 to 60%. The left ventricle has normal function. Left ventricular endocardial border not optimally defined to evaluate regional wall motion - basal septum and basal posterior wall dyssynchronous  but contractile. Left ventricular  diastolic parameters are consistent with Grade I diastolic dysfunction (impaired relaxation).  2. Definity contrast shows no LV mural thrombus.  3. Right ventricular systolic function is low normal. The right ventricular size is normal. There is normal pulmonary artery systolic pressure. The estimated right ventricular systolic pressure is 29.6 mmHg.  4. The mitral valve is degenerative. Trivial mitral valve regurgitation.  5. The aortic valve has an indeterminant number of cusps. There is moderate calcification of the aortic valve. Aortic valve regurgitation is not visualized. Aortic valve sclerosis/calcification is present, without any evidence of aortic stenosis.  6. The inferior vena cava is normal in size with greater than 50% respiratory variability, suggesting right atrial pressure of 3 mmHg. Comparison(s): No prior Echocardiogram. FINDINGS  Left Ventricle: Left ventricular ejection fraction, by estimation, is 55 to 60%. The left ventricle has normal function. Left ventricular endocardial border not optimally defined to evaluate regional wall motion. Definity contrast agent was given IV to delineate the left ventricular endocardial borders. The left ventricular internal cavity size was normal in size. There is no left ventricular hypertrophy. Left ventricular diastolic parameters are consistent with Grade I diastolic dysfunction (impaired relaxation). Right Ventricle: The right ventricular size is normal. No increase in right ventricular wall thickness. Right ventricular systolic function is low  normal. There is normal pulmonary artery systolic pressure. The tricuspid regurgitant velocity is 2.58 m/s,  and with an assumed right atrial pressure of 3 mmHg, the estimated right ventricular systolic pressure is 29.6 mmHg. Left Atrium: Left atrial size was normal in size. Right Atrium: Right atrial size was normal in size. Pericardium: There is no evidence of pericardial effusion. Mitral Valve: The mitral valve is  degenerative in appearance. There is mild calcification of the mitral valve leaflet(s). Trivial mitral valve regurgitation. Tricuspid Valve: The tricuspid valve is grossly normal. Tricuspid valve regurgitation is mild. Aortic Valve: The aortic valve has an indeterminant number of cusps. There is moderate calcification of the aortic valve. There is mild to moderate aortic valve annular calcification. Aortic valve regurgitation is not visualized. Aortic valve sclerosis/calcification is present, without any evidence of aortic stenosis. Pulmonic Valve: The pulmonic valve was not well visualized. Pulmonic valve regurgitation is trivial. Aorta: The aortic root is normal in size and structure. Venous: The inferior vena cava is normal in size with greater than 50% respiratory variability, suggesting right atrial pressure of 3 mmHg. IAS/Shunts: No atrial level shunt detected by color flow Doppler.  LEFT VENTRICLE PLAX 2D LVIDd:         4.10 cm   Diastology LVIDs:         3.70 cm   LV e' medial:    5.44 cm/s LV PW:         0.90 cm   LV E/e' medial:  11.0 LV IVS:        0.80 cm   LV e' lateral:   8.49 cm/s LVOT diam:     1.80 cm   LV E/e' lateral: 7.0 LV SV:         39 LV SV Index:   23 LVOT Area:     2.54 cm  RIGHT VENTRICLE RV S prime:     11.20 cm/s TAPSE (M-mode): 2.0 cm LEFT ATRIUM             Index        RIGHT ATRIUM           Index LA diam:        2.90 cm 1.70 cm/m   RA Area:     17.00 cm LA Vol (A2C):   42.4 ml 24.89 ml/m  RA Volume:   52.50 ml  30.82 ml/m LA Vol (A4C):   44.9 ml 26.36 ml/m LA Biplane Vol: 46.2 ml 27.12 ml/m  AORTIC VALVE LVOT Vmax:   95.30 cm/s LVOT Vmean:  62.600 cm/s LVOT VTI:    0.152 m  AORTA Ao Root diam: 2.70 cm MITRAL VALVE                TRICUSPID VALVE MV Area (PHT): 4.31 cm     TR Peak grad:   26.6 mmHg MV Decel Time: 176 msec     TR Vmax:        258.00 cm/s MV E velocity: 59.60 cm/s MV A velocity: 107.00 cm/s  SHUNTS MV E/A ratio:  0.56         Systemic VTI:  0.15 m                              Systemic Diam: 1.80 cm Nona Dell MD Electronically signed by Nona Dell MD Signature Date/Time: 07/01/2023/3:14:04 PM    Final    DG Abd 1 View  Result Date: 07/01/2023 CLINICAL  DATA:  MRI clearance EXAM: ABDOMEN - 1 VIEW COMPARISON:  X-ray 02/28/2023 FINDINGS: Scattered colonic stool. Gas seen in nondilated loops of small and large bowel. No obstruction. Degenerative changes seen of the spine and pelvis particularly of the right hip. Left hip arthroplasty seen at the edge of the imaging field, incompletely included. There is contrast in the renal collecting systems including the bladder which appears to has a trabeculated wall with some potential bladder diverticula. There is a vascular stent overlying the left iliac region. Please correlate for time course and type of stent. Overlapping cardiac leads. IMPRESSION: Nonspecific bowel gas pattern with scattered stool. Left hip prosthesis.  Degenerative changes. Left iliac vascular stents. Please correlate with the type and time of placement of stent. Please correlate for compatibility for MRI Electronically Signed   By: Karen Kays M.D.   On: 07/01/2023 14:47   DG Chest Portable 1 View  Result Date: 07/01/2023 CLINICAL DATA:  Hypoxia. EXAM: PORTABLE CHEST 1 VIEW COMPARISON:  02/27/2023. FINDINGS: Redemonstration of increased interstitial markings throughout bilateral lungs, grossly similar to the prior study. No frank pulmonary edema. There are heterogeneous opacities in the right paracardiac region. Differential diagnosis includes combination of vascular/interstitial markings versus superimposed atelectasis/pneumonitis. Correlate clinically. Bilateral lung fields are otherwise clear. Bilateral costophrenic angles are clear. Normal cardio-mediastinal silhouette. No acute osseous abnormalities. The soft tissues are within normal limits. IMPRESSION: *Heterogeneous opacities in the right paracardiac region, as discussed above.  Electronically Signed   By: Jules Schick M.D.   On: 07/01/2023 13:56   CT HEAD CODE STROKE WO CONTRAST  Result Date: 07/01/2023 CLINICAL DATA:  Right gaze deviation, altered mental status, stroke suspected EXAM: CT ANGIOGRAPHY HEAD AND NECK CT PERFUSION BRAIN TECHNIQUE: Multidetector CT imaging of the head and neck was performed using the standard protocol during bolus administration of intravenous contrast. Multiplanar CT image reconstructions and MIPs were obtained to evaluate the vascular anatomy. Carotid stenosis measurements (when applicable) are obtained utilizing NASCET criteria, using the distal internal carotid diameter as the denominator. Multiphase CT imaging of the brain was performed following IV bolus contrast injection. Subsequent parametric perfusion maps were calculated using RAPID software. RADIATION DOSE REDUCTION: This exam was performed according to the departmental dose-optimization program which includes automated exposure control, adjustment of the mA and/or kV according to patient size and/or use of iterative reconstruction technique. CONTRAST:  OMNIPAQUE IOHEXOL 350 MG/ML SOLN COMPARISON:  02/27/2023 CT head FINDINGS: CT HEAD FINDINGS Brain: No evidence of acute infarction, hemorrhage, mass, mass effect, or midline shift. No hydrocephalus or extra-axial collection. 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 Partial empty sella. Normal craniocervical junction. Periventricular white matter changes, likely the sequela of chronic small vessel ischemic disease. Vascular: No hyperdense vessel. Skull: Negative for fracture or focal lesion. Sinuses/Orbits: No acute finding. Other: The mastoid air cells are well aerated. ASPECTS [facility] Stroke Program Early CT Score) - Ganglionic level infarction (caudate, lentiform nuclei, internal capsule, insula, M1-M3 cortex): 7 - Supraganglionic infarction (M4-M6 cortex): 3  Total score (0-10 with 10 being normal): 10 CTA NECK FINDINGS Aortic arch: Standard branching. Imaged portion shows no evidence of aneurysm or dissection. 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 Aortic atherosclerosis. Multifocal stenosis in the right subclavian artery is  not hemodynamically significant (series 6, image 280). Right carotid system: No evidence of dissection, occlusion, or hemodynamically significant stenosis (greater than 50%). Atherosclerotic disease in the common carotid, at the bifurcation, and in the proximal ICA is not hemodynamically significant. Left carotid system: No evidence of dissection, occlusion, or hemodynamically significant stenosis (greater than 50%). Atherosclerotic disease in the common carotid, at the bifurcation, and in the proximal ICA is not hemodynamically significant. Vertebral arteries: The left vertebral artery is poorly opacified at its origin and in the left V1 and proximal V2 segments, with improved opacification distally, likely reflecting retrograde flow. The right vertebral artery is patent from its origin to the skull base, without significant stenosis or evidence of dissection. Skeleton: No acute osseous abnormality. Degenerative changes in the cervical spine. Other neck: No acute finding. Upper chest: No focal pulmonary opacity or pleural effusion. Emphysema. Review of the MIP images confirms the above findings CTA HEAD FINDINGS Anterior circulation: Both internal carotid arteries are patent to the termini, with severe stenosis in the distal left cavernous ICA (series 6, image 119), and moderate stenosis in the right cavernous and bilateral supraclinoid ICA. A1 segments patent, hypoplastic on the left. Normal anterior communicating artery. Azygous  A2. Anterior cerebral arteries are patent to their distal aspects without significant stenosis. No M1 stenosis or occlusion. MCA branches perfused to their distal aspects without significant stenosis. Posterior circulation: Vertebral arteries patent to the vertebrobasilar junction with moderate stenosis in the proximal left V4 (series 6, image 147) and severe stenosis near the vertebrobasilar junction (series 6, image 130 posterior inferior cerebellar arteries patent proximally. Basilar patent to its distal aspect without significant stenosis. Superior cerebellar arteries patent proximally. Patent P1 segments. PCAs perfused to their distal aspects without significant stenosis. The bilateral posterior communicating arteries are patent. Venous sinuses: As permitted by contrast timing, patent. Anatomic variants: None significant. No evidence of aneurysm or vascular malformation. Review of the MIP images confirms the above findings CT Brain Perfusion Findings: ASPECTS: 10 CBF (<30%) Volume: 0mL Perfusion (Tmax>6.0s) volume: 0mL Mismatch Volume: 0mL Infarction Location:None IMPRESSION: 1. No acute intracranial process. ASPECTS is 10. 2. No intracranial large vessel occlusion. 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 Moderate stenosis right cavernous and bilateral supraclinoid ICA and more proximal left V4 3. 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 The left vertebral artery is poorly opacified at its origin and in the left V1 and proximal V2 segments, with improved opacification distally, likely reflecting retrograde flow to the left subclavian this territory. 4. No hemodynamically significant stenosis in the neck. 5. No evidence of infarct core or penumbra on CT perfusion. 6. Aortic Atherosclerosis (ICD10-I70.0) and Emphysema (ICD10-J43.9). Code stroke imaging results were communicated on 07/01/2023 at 12:19  pm to provider Eloise Harman via telephone, who verbally acknowledged these results. Electronically Signed   By: Wiliam Ke M.D.   On: 07/01/2023 12:33   CT ANGIO HEAD NECK W WO CM W PERF (CODE STROKE)  Result Date: 07/01/2023 CLINICAL DATA:  Right gaze deviation, altered mental status, stroke suspected EXAM: CT ANGIOGRAPHY HEAD AND NECK CT PERFUSION BRAIN TECHNIQUE: Multidetector CT imaging of the head and neck was performed using the standard protocol during bolus administration of intravenous contrast. Multiplanar CT image reconstructions and MIPs were obtained to evaluate the vascular anatomy. Carotid stenosis measurements (when applicable) are obtained utilizing NASCET criteria, using the distal internal carotid diameter as the denominator. Multiphase  CT imaging of the brain was performed following IV bolus contrast injection. Subsequent parametric perfusion maps were calculated using RAPID software. RADIATION DOSE REDUCTION: This exam was performed according to the departmental dose-optimization program which includes automated exposure control, adjustment of the mA and/or kV according to patient size and/or use of iterative reconstruction technique. CONTRAST:  OMNIPAQUE IOHEXOL 350 MG/ML SOLN COMPARISON:  02/27/2023 CT head FINDINGS: CT HEAD FINDINGS Brain: No evidence of acute infarction, hemorrhage, mass, mass effect, or midline shift. No hydrocephalus or extra-axial collection. 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 Partial empty sella. Normal craniocervical junction. Periventricular white matter changes, likely the sequela of chronic small vessel ischemic disease. Vascular: No hyperdense vessel. Skull: Negative for fracture or focal lesion. Sinuses/Orbits: No acute finding. Other: The mastoid air cells are well aerated. ASPECTS [facility] Stroke Program Early CT Score) - Ganglionic level infarction (caudate, lentiform  nuclei, internal capsule, insula, M1-M3 cortex): 7 - Supraganglionic infarction (M4-M6 cortex): 3 Total score (0-10 with 10 being normal): 10 CTA NECK FINDINGS Aortic arch: Standard branching. Imaged portion shows no evidence of aneurysm or dissection. 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 Aortic atherosclerosis. Multifocal stenosis in the right subclavian artery is not hemodynamically significant (series 6, image 280). Right carotid system: No evidence of dissection, occlusion, or hemodynamically significant stenosis (greater than 50%). Atherosclerotic disease in the common carotid, at the bifurcation, and in the proximal ICA is not hemodynamically significant. Left carotid system: No evidence of dissection, occlusion, or hemodynamically significant stenosis (greater than 50%). Atherosclerotic disease in the common carotid, at the bifurcation, and in the proximal ICA is not hemodynamically significant. Vertebral arteries: The left vertebral artery is poorly opacified at its origin and in the left V1 and proximal V2 segments, with improved opacification distally, likely reflecting retrograde flow. The right vertebral artery is patent from its origin to the skull base, without significant stenosis or evidence of dissection. Skeleton: No acute osseous abnormality. Degenerative changes in the cervical spine. Other neck: No acute finding. Upper chest: No focal pulmonary opacity or pleural effusion. Emphysema. Review of the MIP images confirms the above findings CTA HEAD FINDINGS Anterior circulation: Both internal carotid arteries are patent to the termini, with severe stenosis in the distal left cavernous ICA (series 6, image 119), and moderate stenosis in the right cavernous and bilateral supraclinoid  ICA. A1 segments patent, hypoplastic on the left. Normal anterior communicating artery. Azygous A2. Anterior cerebral arteries are patent to their distal aspects without significant stenosis. No M1 stenosis or occlusion. MCA branches perfused to their distal aspects without significant stenosis. Posterior circulation: Vertebral arteries patent to the vertebrobasilar junction with moderate stenosis in the proximal left V4 (series 6, image 147) and severe stenosis near the vertebrobasilar junction (series 6, image 130 posterior inferior cerebellar arteries patent proximally. Basilar patent to its distal aspect without significant stenosis. Superior cerebellar arteries patent proximally. Patent P1 segments. PCAs perfused to their distal aspects without significant stenosis. The bilateral posterior communicating arteries are patent. Venous sinuses: As permitted by contrast timing, patent. Anatomic variants: None significant. No evidence of aneurysm or vascular malformation. Review of the MIP images confirms the above findings CT Brain Perfusion Findings: ASPECTS: 10 CBF (<30%) Volume: 0mL Perfusion (Tmax>6.0s) volume: 0mL  Mismatch Volume: 0mL Infarction Location:None IMPRESSION: 1. No acute intracranial process. ASPECTS is 10. 2. No intracranial large vessel occlusion. 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 Moderate stenosis right cavernous and bilateral supraclinoid ICA and more proximal left V4 3. 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 The left vertebral artery is poorly opacified at its origin and in the left V1 and proximal V2 segments, with improved opacification distally, likely reflecting retrograde flow to the left subclavian this territory. 4. No hemodynamically significant stenosis in the neck. 5. No evidence of infarct core or penumbra on CT perfusion. 6. Aortic Atherosclerosis (ICD10-I70.0)  and Emphysema (ICD10-J43.9). Code stroke imaging results were communicated on 07/01/2023 at 12:19 pm to provider Eloise Harman via telephone, who verbally acknowledged these results. Electronically Signed   By: Wiliam Ke M.D.   On: 07/01/2023 12:33    EKG:   Independently reviewed.  Orders placed or performed during the hospital encounter of 07/01/23   EKG 12-Lead   EKG 12-Lead   ED EKG   ED EKG   ED EKG   ED EKG   EKG 12-Lead   ---------------------------------------------------------------------------------------------------------------------------------------    Assessment / Plan:   Principal Problem:   Stroke [facility]) Active Problems:   Ischemic leg   Depression   Leukocytosis   Tremor   Hypertension   Diabetes mellitus without complication (HCC)   GERD (gastroesophageal reflux disease)   Assessment and Plan: * Stroke [facility]) - Admitted to telemetry for for new symptom/onset of a stroke Aphasic, with complete neglect, -Stroke workup -Presumably patient is on aspirin/Plavix and statins (?  Compliance) -Teleneurology Dr. Selina Mcgrath consulted -appreciate input -N.P.O. - MRI brain: -2D Echo: EJF:55 to 60%.  Grade I diastolic dysfunction    -Consulting PT/OT/speech  CT angiogram head/neck IMPRESSION: 1. No acute intracranial process. ASPECTS is 10. 2. No intracranial large vessel occlusion. 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 Moderate stenosis right cavernous and bilateral supraclinoid ICA and more proximal left V4 3. 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 The left vertebral artery is poorly opacified at its origin and in the left V1 and proximal V2 segments, with improved opacification distally, likely reflecting retrograde flow to the left subclavian this territory. 4. No hemodynamically significant stenosis in the neck. 5. No evidence of infarct core or  penumbra on CT perfusion. 6. Aortic Atherosclerosis  -Chronic left-sided weakness, uses wheelchair -ambulate few steps at baseline currently unable to ambulate or speak, with right facial droop If no improvement in above symptoms with dysphagia, prognosis will remain poor  -Will allow for permissive hypertension  Leukocytosis - Leukocytosis with WBC of 20.7, lymphocyte of 16.3 Afebrile, chest x-ray clear -Pending UA -Withholding antibiotics for now   Depression Home medication revealed Cymbalta, Paxil, Seroquel, as needed Ativan, -All oral medication will be on hold -Will utilize as needed Haldol  Ischemic leg -Tensive history of peripheral vascular disease with history of revascularization - He was supposed to be on aspirin and Plavix (?  Compliance) Once tolerating p.o. will resume -With chronically left-sided weakness Limited ambulate at baseline with cane and wheelchair   Tremor - Will monitor closely -  GERD (gastroesophageal reflux disease) Changing p.o. PPI to IV Protonix  Diabetes mellitus without complication (HCC) -Holding home medication of metformin -Checking CBG every 4 hours with SSI coverage  Hypertension - Hypertension with history of hypotension, currently med rec revealed patient is on midodrine -We will holding home medication of Lopressor, will resume midodrine when able to tolerate PO       Consults called:   Teleneurology -------------------------------------------------------------------------------------------------------------------------------------------- DVT prophylaxis:  heparin injection 5,000 Units Start: 07/01/23 2200 TED hose Start: 07/01/23 1309 SCDs Start: 07/01/23 1309   Code Status:   Code Status: Full Code   Admission status: Patient will be admitted as Inpatient, with a greater than 2 midnight length of stay. Level of care: Telemetry   Family Communication:  none at bedside  (The above findings and plan of care has  been discussed with patient in detail, the patient expressed understanding and agreement of above plan)  --------------------------------------------------------------------------------------------------------------------------------------------------  Disposition Plan:  Anticipated 1-2 days Status is: Inpatient Remains inpatient appropriate because: Needing stroke workup  --------------------------------------------------------------------------------------------------------------------------------------------  Time spent:  63  Min.  Was spent seeing and evaluating the patient, reviewing all medical records, drawn plan of care.  SIGNED: Kendell Bane, MD, FHM. FAAFP. [facility] [place], Pager  (Please use [email or web address] to page/ or secure chat through epic) If 7PM-7AM, please contact night-coverage [email or web address],  07/01/2023, 3:50 PM

## 2023-07-01 NOTE — Progress Notes (Addendum)
[facility] 1132  Dr. Selina Cooley paged [phone] Pt to CT 1136 Dr. Selina Cooley on screen 1138 Pt back from CT 1200  Pts LNW 1900 12/5

## 2023-07-01 NOTE — TOC Initial Note (Signed)
Transition of Care (TOC) - Initial/Assessment Note    Patient Details  Name: Jacob Mcgrath. MRN: [id number] Date of Birth: 08-27-1955  Transition of Care [facility]) CM/SW Contact:    Elliot Gault, LCSW Phone Number: 07/01/2023, 2:33 PM  Clinical Narrative:                  Pt here from [facility]. MD notified TOC that pt's legal guardian had a question about how to request access to pt's My Chart account. Called guardian to provide phone number to My Chart. Guardian states pt will return to [facility] at [employer].  TOC will follow.  Expected Discharge Plan: Long Term Nursing Home Barriers to Discharge: Continued Medical Work up   Patient Goals and CMS Choice Patient states their goals for this hospitalization and ongoing recovery are:: return to [facility] CMS Medicare.gov Compare Post Acute Care list provided to:: Legal Guardian   [place] ownership interest in [facility].provided to:: [facility] POA / Guardian    Expected Discharge Plan and Services In-house Referral: Clinical Social Work   Post Acute Care Choice: Resumption of Svcs/PTA Provider Living arrangements for the past 2 months: Skilled Nursing Facility                                      Prior Living Arrangements/Services Living arrangements for the past 2 months: Skilled Nursing Facility Lives with:: Facility Resident Patient language and need for interpreter reviewed:: Yes Do you feel safe going back to the place where you live?: Yes      Need for Family Participation in Patient Care: No (Comment) Care giver support system in place?: Yes (comment)   Criminal Activity/Legal Involvement Pertinent to Current Situation/Hospitalization: No - Comment as needed  Activities of Daily Living   ADL Screening (condition at time of admission) Independently performs ADLs?: No Does the patient have a NEW difficulty with bathing/dressing/toileting/self-feeding that is expected to last >3 days?: Yes (Initiates  electronic notice to provider for possible OT consult) Does the patient have a NEW difficulty with getting in/out of bed, walking, or climbing stairs that is expected to last >3 days?: Yes (Initiates electronic notice to provider for possible PT consult) Does the patient have a NEW difficulty with communication that is expected to last >3 days?: Yes (Initiates electronic notice to provider for possible SLP consult) Is the patient deaf or have difficulty hearing?: No Does the patient have difficulty seeing, even when wearing glasses/contacts?: No Does the patient have difficulty concentrating, remembering, or making decisions?: No  Permission Sought/Granted                  Emotional Assessment       Orientation: : Oriented to Self Alcohol / Substance Use: Not Applicable Psych Involvement: No (comment)  Admission diagnosis:  Stroke [facility]) [I63.9] Patient Active Problem List   Diagnosis Date Noted   Stroke (HCC) 07/01/2023   Leukocytosis 07/01/2023    Class: Acute   Tremor 02/27/2023   GERD (gastroesophageal reflux disease) 02/27/2023   Depression 02/27/2023   Protein-calorie malnutrition, severe 10/07/2022   Ischemic leg 10/01/2022   Hypertension 10/01/2022   Diabetes mellitus without complication (HCC) 10/01/2022   PCP:  Benetta Spar, MD Pharmacy:   [facility], [place] - [address] [place] [address] Phone: [phone] Fax: [phone]     Social Determinants of Health (SDOH) Social  History: SDOH Screenings   Food Insecurity: No Food Insecurity (07/01/2023)  Housing: Patient Unable To Answer (07/01/2023)  Transportation Needs: No Transportation Needs (07/01/2023)  Utilities: Not At Risk (07/01/2023)  Tobacco Use: High Risk (07/01/2023)   SDOH Interventions:     Readmission Risk Interventions    03/01/2023    2:40 PM  Readmission Risk Prevention Plan  Transportation Screening Complete  PCP or Specialist Appt within  5-7 Days Not Complete  Home Care Screening Complete  Medication Review (RN CM) Complete

## 2023-07-01 NOTE — Progress Notes (Signed)
Dr. Geronimo Boot messaged about Lactic acid of 2.4

## 2023-07-02 ENCOUNTER — Inpatient Hospital Stay (HOSPITAL_COMMUNITY): Payer: Medicare Other

## 2023-07-02 DIAGNOSIS — R569 Unspecified convulsions: Secondary | ICD-10-CM | POA: Diagnosis not present

## 2023-07-02 DIAGNOSIS — G40909 Epilepsy, unspecified, not intractable, without status epilepticus: Secondary | ICD-10-CM

## 2023-07-02 DIAGNOSIS — G40209 Localization-related (focal) (partial) symptomatic epilepsy and epileptic syndromes with complex partial seizures, not intractable, without status epilepticus: Secondary | ICD-10-CM

## 2023-07-02 DIAGNOSIS — R2990 Unspecified symptoms and signs involving the nervous system: Secondary | ICD-10-CM

## 2023-07-02 LAB — GLUCOSE, CAPILLARY
Glucose-Capillary: 135 mg/dL — ABNORMAL HIGH (ref 70–99)
Glucose-Capillary: 146 mg/dL — ABNORMAL HIGH (ref 70–99)
Glucose-Capillary: 130 mg/dL — ABNORMAL HIGH (ref 70–99)
Glucose-Capillary: 134 mg/dL — ABNORMAL HIGH (ref 70–99)

## 2023-07-02 LAB — BASIC METABOLIC PANEL
Anion gap: 9 (ref 5–15)
BUN: 18 mg/dL (ref 8–23)
CO2: 21 mmol/L — ABNORMAL LOW (ref 22–32)
Calcium: 9 mg/dL (ref 8.9–10.3)
Chloride: 109 mmol/L (ref 98–111)
Creatinine, Ser: 1.01 mg/dL (ref 0.61–1.24)
GFR, Estimated: >60 mL/min (ref >60)
Glucose, Bld: 134 mg/dL — ABNORMAL HIGH (ref 70–99)
Potassium: 3.9 mmol/L (ref 3.5–5.1)
Sodium: 139 mmol/L (ref 135–145)

## 2023-07-02 LAB — PROCALCITONIN: Procalcitonin: 7.11 ng/mL

## 2023-07-02 LAB — CBC
HCT: 38.5 % — ABNORMAL LOW (ref 39.0–52.0)
Hemoglobin: 12.8 g/dL — ABNORMAL LOW (ref 13.0–17.0)
MCH: 29.6 pg (ref 26.0–34.0)
MCHC: 33.2 g/dL (ref 30.0–36.0)
MCV: 89.1 fL (ref 80.0–100.0)
Platelets: 228 10*3/uL (ref 150–400)
RBC: 4.32 MIL/uL (ref 4.22–5.81)
RDW: 13.6 % (ref 11.5–15.5)
WBC: 18.5 10*3/uL — ABNORMAL HIGH (ref 4.0–10.5)
nRBC: 0 % (ref 0.0–0.2)

## 2023-07-02 LAB — LACTIC ACID, PLASMA: Lactic Acid, Venous: 1.7 mmol/L (ref 0.5–1.9)

## 2023-07-02 LAB — PROTIME-INR
INR: 1.3 — ABNORMAL HIGH (ref 0.8–1.2)
Prothrombin Time: 16.2 s — ABNORMAL HIGH (ref 11.4–15.2)

## 2023-07-02 LAB — APTT: aPTT: 32 s (ref 24–36)

## 2023-07-02 MED ORDER — AZITHROMYCIN 500 MG PO TABS
500.0000 mg | ORAL_TABLET | Freq: Every day | ORAL | Status: DC
  Administered 2023-07-02 – 2023-07-05 (×4): 500 mg via ORAL
  Filled 2023-07-02 (×4): qty 1

## 2023-07-02 MED ORDER — METOPROLOL TARTRATE 25 MG PO TABS
25.0000 mg | ORAL_TABLET | Freq: Two times a day (BID) | ORAL | Status: DC
  Administered 2023-07-02 – 2023-07-05 (×7): 25 mg via ORAL
  Filled 2023-07-02 (×7): qty 1

## 2023-07-02 MED ORDER — SODIUM CHLORIDE 0.9 % IV SOLN
2.0000 g | INTRAVENOUS | Status: DC
  Administered 2023-07-02 – 2023-07-05 (×4): 2 g via INTRAVENOUS
  Filled 2023-07-02 (×4): qty 20

## 2023-07-02 MED ORDER — ENSURE ENLIVE PO LIQD
237.0000 mL | Freq: Two times a day (BID) | ORAL | Status: DC
  Administered 2023-07-03 – 2023-07-05 (×4): 237 mL via ORAL

## 2023-07-02 MED ORDER — LEVETIRACETAM IN NACL 500 MG/100ML IV SOLN
500.0000 mg | Freq: Two times a day (BID) | INTRAVENOUS | Status: DC
  Administered 2023-07-02 – 2023-07-04 (×5): 500 mg via INTRAVENOUS
  Filled 2023-07-02 (×5): qty 100

## 2023-07-02 MED ORDER — SODIUM CHLORIDE 0.9 % IV BOLUS
500.0000 mL | Freq: Once | INTRAVENOUS | Status: AC
  Administered 2023-07-02: 500 mL via INTRAVENOUS

## 2023-07-02 NOTE — Progress Notes (Signed)
LTM EEG running - no initial skin breakdown - push button tested - neuro notified. Hu charge captured [facility]

## 2023-07-02 NOTE — Progress Notes (Signed)
MEWS Progress Note  Patient Details Name: Jacob Mcgrath. MRN: [id number] DOB: 12/03/1955 Today's Date: 07/02/2023   MEWS Flowsheet Documentation:  Assess: MEWS Score Temp: 98.6 F (37 C) BP: (!) 166/98 MAP (mmHg): 118 Pulse Rate: (!) 133 ECG Heart Rate: (!) 110 Resp: 19 Level of Consciousness: Alert SpO2: 93 % O2 Device: Room Air O2 Flow Rate (L/min): 2 L/min Assess: MEWS Score MEWS Temp: 0 MEWS Systolic: 0 MEWS Pulse: 3 MEWS RR: 0 MEWS LOC: 0 MEWS Score: 3 MEWS Score Color: Yellow Assess: SIRS CRITERIA SIRS Temperature : 0 SIRS Respirations : 0 SIRS Pulse: 1 SIRS WBC: 0 SIRS Score Sum : 1 SIRS Temperature : 0 SIRS Pulse: 1 SIRS Respirations : 0 SIRS WBC: 0 SIRS Score Sum : 1 Assess: if the MEWS score is Yellow or Red Were vital signs accurate and taken at a resting state?: Yes Does the patient meet 2 or more of the SIRS criteria?: Yes Does the patient have a confirmed or suspected source of infection?: No MEWS guidelines implemented : Yes, yellow Treat MEWS Interventions: Considered administering scheduled or prn medications/treatments as ordered Take Vital Signs Increase Vital Sign Frequency : Yellow: Q2hr x1, continue Q4hrs until patient remains green for 12hrs Escalate MEWS: Escalate: Yellow: Discuss with charge nurse and consider notifying provider and/or RRT Notify: Charge Nurse/RN Name of Charge Nurse/RN Notified: Marylene Land, RN Provider Notification Provider Name/Title: [place], Eric Date Provider Notified: 07/02/23 Time Provider Notified: 0915 Method of Notification:  (Chat) Notification Reason: Other (Comment) (Yellow MEWS bp and HR elevated) Provider response: En route Date of Provider Response: 07/02/23 Time of Provider Response: 0917      [name] 07/02/2023, 9:17 AM

## 2023-07-02 NOTE — Procedures (Incomplete Revision)
Patient Name: Jacob Mcgrath.  MRN: [id number]  Epilepsy Attending: Charlsie Quest  Referring Physician/Provider: Erick Blinks, MD  Duration: 07/02/2023 6295 to 07/03/2023 2841   Patient history: 67yo M with left sided weakness getting eeg to evaluate for seizure   Level of alertness: Awake, asleep  AEDs during EEG study: LEV  Technical aspects: This EEG study was done with scalp electrodes positioned according to the 10-20 International system of electrode placement. Electrical activity was reviewed with band pass filter of 1-70Hz , sensitivity of 7 uV/mm, display speed of 34mm/sec with a 60Hz  notched filter applied as appropriate. EEG data were recorded continuously and digitally stored.  Video monitoring was available and reviewed as appropriate.  Description: No clear posterior dominant rhythm was seen. Sleep was characterized by vertex waves, sleep spindles (12 to 14 Hz), maximal frontocentral region. EEG showed continuous generalized and lateralized right hemisphere 3 to 6 Hz theta-delta slowing in right hemisphere. Lateralized periodic discharges were noted in right hemisphere, maximal right centro-temporo-parietal region at 0.25-1Hz , at times with overlying rhythmicity. Hyperventilation and photic stimulation were not performed.    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 This eeg pattern is on the ictal-interictal continuum with increased risk of seizure recurrence.  Additionally there was cortical dysfunction arising from right hemisphere likely secondary to underlying structural abnormality, post-ictal state.  Lastly there was moderate diffuse encephalopathy.  No definite seizures  were seen throughout the  recording.   Palmira Stickle Annabelle Harman

## 2023-07-02 NOTE — Plan of Care (Signed)
  Problem: Education: Goal: Knowledge of General Education information will improve Description: Including pain rating scale, medication(s)/side effects and non-pharmacologic comfort measures Outcome: Progressing   Problem: Health Behavior/Discharge Planning: Goal: Ability to manage health-related needs will improve Outcome: Progressing   Problem: Clinical Measurements: Goal: Ability to maintain clinical measurements within normal limits will improve Outcome: Progressing Goal: Will remain free from infection Outcome: Progressing Goal: Diagnostic test results will improve Outcome: Progressing Goal: Respiratory complications will improve Outcome: Progressing Goal: Cardiovascular complication will be avoided Outcome: Progressing   Problem: Activity: Goal: Risk for activity intolerance will decrease Outcome: Progressing   Problem: Nutrition: Goal: Adequate nutrition will be maintained Outcome: Progressing   Problem: Coping: Goal: Level of anxiety will decrease Outcome: Progressing   Problem: Elimination: Goal: Will not experience complications related to bowel motility Outcome: Progressing Goal: Will not experience complications related to urinary retention Outcome: Progressing   Problem: Pain Management: Goal: General experience of comfort will improve Outcome: Progressing   Problem: Safety: Goal: Ability to remain free from injury will improve Outcome: Progressing   Problem: Skin Integrity: Goal: Risk for impaired skin integrity will decrease Outcome: Progressing   Problem: Education: Goal: Ability to describe self-care measures that may prevent or decrease complications (Diabetes Survival Skills Education) will improve Outcome: Progressing Goal: Individualized Educational Video(s) Outcome: Progressing   Problem: Coping: Goal: Ability to adjust to condition or change in health will improve Outcome: Progressing   Problem: Fluid Volume: Goal: Ability to  maintain a balanced intake and output will improve Outcome: Progressing   Problem: Health Behavior/Discharge Planning: Goal: Ability to identify and utilize available resources and services will improve Outcome: Progressing Goal: Ability to manage health-related needs will improve Outcome: Progressing   Problem: Metabolic: Goal: Ability to maintain appropriate glucose levels will improve Outcome: Progressing   Problem: Nutritional: Goal: Maintenance of adequate nutrition will improve Outcome: Progressing Goal: Progress toward achieving an optimal weight will improve Outcome: Progressing   Problem: Skin Integrity: Goal: Risk for impaired skin integrity will decrease Outcome: Progressing   Problem: Tissue Perfusion: Goal: Adequacy of tissue perfusion will improve Outcome: Progressing   Problem: Education: Goal: Knowledge of disease or condition will improve Outcome: Progressing Goal: Knowledge of secondary prevention will improve (MUST DOCUMENT ALL) Outcome: Progressing Goal: Knowledge of patient specific risk factors will improve Loraine Leriche N/A or DELETE if not current risk factor) Outcome: Progressing   Problem: Ischemic Stroke/TIA Tissue Perfusion: Goal: Complications of ischemic stroke/TIA will be minimized Outcome: Progressing   Problem: Coping: Goal: Will verbalize positive feelings about self Outcome: Progressing Goal: Will identify appropriate support needs Outcome: Progressing   Problem: Health Behavior/Discharge Planning: Goal: Ability to manage health-related needs will improve Outcome: Progressing Goal: Goals will be collaboratively established with patient/family Outcome: Progressing   Problem: Self-Care: Goal: Ability to participate in self-care as condition permits will improve Outcome: Progressing Goal: Verbalization of feelings and concerns over difficulty with self-care will improve Outcome: Progressing Goal: Ability to communicate needs accurately  will improve Outcome: Progressing   Problem: Nutrition: Goal: Risk of aspiration will decrease Outcome: Progressing Goal: Dietary intake will improve Outcome: Progressing

## 2023-07-02 NOTE — Evaluation (Signed)
Physical Therapy Evaluation Patient Details Name: Jacob Mcgrath. MRN: [id number] DOB: [birth year] Today's Date: 07/02/2023  History of Present Illness  67 year old male from SNF Presented 07/01/23 to the ED with change in mental status. Lt hemiplegia, rt gaze preference, NIHSS 25; CT head and MRI brain no acute changes. EEG Rt centrotemporal epileptogenicity   PMH significant history of dementia, DM 2, neuropathy, HTN, HDL, GERD, depression, anxiety, PVD,CVA  Clinical Impression   Pt admitted secondary to problem above with deficits below. PTA patient was residing at a facility--apparently a long-term resident. He was standing (and ?walking short distances) with a cane and then using a wheelchair for locomotion. Unclear if he was able to self-propel.  Pt currently requires +2 total assist for EOB mobility, but able to pull his torso away from bed in chair position for unsupported sitting with mod assist. Able to follow commands with Rt extremities.  Anticipate patient will benefit from PT to address problems listed below.Will continue to follow acutely to maximize functional mobility independence and safety.  Patient will benefit from continued inpatient follow up therapy, <3 hours/day          If plan is discharge home, recommend the following: Two people to help with walking and/or transfers;Assistance with cooking/housework;Assistance with feeding;Direct supervision/assist for medications management;Direct supervision/assist for financial management;Assist for transportation;Help with stairs or ramp for entrance;Supervision due to cognitive status   Can travel by private vehicle   No    Equipment Recommendations None recommended by PT  Recommendations for Other Services       Functional Status Assessment Patient has had a recent decline in their functional status and demonstrates the ability to make significant improvements in function in a reasonable and predictable amount of time.      Precautions / Restrictions Precautions Precautions: Fall;Other (comment) Precaution Comments: L UE hemiplegia from prior CVA, EEG Restrictions Weight Bearing Restrictions: No      Mobility  Bed Mobility Overal bed mobility: Needs Assistance             General bed mobility comments: placed bed in chair position and pt able to use RUE to pull himself forward to unsupported sitting with mod assist; repeated x2    Transfers                   General transfer comment: deferred; noted resisted OT trying to get to EOB and no +2 available    Ambulation/Gait                  Stairs            Wheelchair Mobility     Tilt Bed    Modified Rankin (Stroke Patients Only) Modified Rankin (Stroke Patients Only) Pre-Morbid Rankin Score: Moderately severe disability Modified Rankin: Severe disability     Balance Overall balance assessment: Needs assistance Sitting-balance support: Single extremity supported Sitting balance-Leahy Scale: Poor Sitting balance - Comments: posterior bias                                     Pertinent Vitals/Pain Pain Assessment Pain Assessment: No/denies pain    Home Living Family/patient expects to be discharged to:: Skilled nursing facility                   Additional Comments: From [facility] SNF    Prior Function Prior Level of Function : Needs assist;Patient poor  historian/Family not available             Mobility Comments: pt reports walking with a cane but also using a wheelchair - this is also indicated in chart. with prior hospitalization, pt was able to stand with PT in August for pivoting ADLs Comments: pt reports able to manage ADLs but A&Ox1 so anticipate likely extensive assist needed for ADLs     Extremity/Trunk Assessment   Upper Extremity Assessment Upper Extremity Assessment: Defer to OT evaluation    Lower Extremity Assessment Lower Extremity Assessment:  RLE deficits/detail;LLE deficits/detail RLE Deficits / Details: AAROM WFL; followed commands for wiggling toes, ankle PF/DF, hip and knee flexion with resisted extension LLE Deficits / Details: no active movement noted; PROM ankle to neutral, hip rests in abd/ER with ability to achieve only neutral IR    Cervical / Trunk Assessment Cervical / Trunk Assessment: Normal  Communication   Communication Communication: Difficulty communicating thoughts/reduced clarity of speech (soft-spoken and ?word-finding issues) Cueing Techniques: Verbal cues;Gestural cues;Tactile cues  Cognition Arousal: Alert Behavior During Therapy: Flat affect Overall Cognitive Status: No family/caregiver present to determine baseline cognitive functioning                                 General Comments: hx of dementia, A&Ox1, poor recall of PLOF. inconsistent command following, especially with actions that involve lower extremities.        General Comments      Exercises Other Exercises Other Exercises: AAROM RLE, PROM LLE x 3 reps all joints and planes   Assessment/Plan    PT Assessment Patient needs continued PT services  PT Problem List Decreased strength;Decreased range of motion;Decreased activity tolerance;Decreased balance;Decreased mobility;Decreased cognition;Decreased knowledge of use of DME;Decreased safety awareness;Decreased knowledge of precautions;Impaired tone       PT Treatment Interventions DME instruction;Gait training;Functional mobility training;Therapeutic activities;Therapeutic exercise;Balance training;Neuromuscular re-education;Patient/family education    PT Goals (Current goals can be found in the Care Plan section)  Acute Rehab PT Goals Patient Stated Goal: unable to state PT Goal Formulation: Patient unable to participate in goal setting Time For Goal Achievement: 07/16/23 Potential to Achieve Goals: Fair    Frequency Min 1X/week     Co-evaluation                AM-PAC PT "6 Clicks" Mobility  Outcome Measure Help needed turning from your back to your side while in a flat bed without using bedrails?: Total Help needed moving from lying on your back to sitting on the side of a flat bed without using bedrails?: Total Help needed moving to and from a bed to a chair (including a wheelchair)?: Total Help needed standing up from a chair using your arms (e.g., wheelchair or bedside chair)?: Total Help needed to walk in hospital room?: Total Help needed climbing 3-5 steps with a railing? : Total 6 Click Score: 6    End of Session   Activity Tolerance: Patient tolerated treatment well Patient left: in bed;with call bell/phone within reach;with chair alarm set   PT Visit Diagnosis: Other symptoms and signs involving the nervous system (R29.898)    Time: 1884-1660 PT Time Calculation (min) (ACUTE ONLY): 14 min   Charges:   PT Evaluation $PT Eval Low Complexity: 1 Low   PT General Charges $$ ACUTE PT VISIT: 1 Visit          Jerolyn Center, PT Acute Rehabilitation Services  Office [phone]  Scherrie November Kaziyah Parkison 07/02/2023, 1:05 PM

## 2023-07-02 NOTE — Progress Notes (Signed)
NEUROLOGY CONSULT NOTE   Date of service: July 02, 2023 Patient Name: Jacob Mcgrath. MRN:  [id number] DOB:  [birth year] Chief Complaint: "Altered mental status" Requesting Provider: [place], Eric J, DO  History of Present Illness  Jacob Ashly Maltese. is a 67 y.o. male  has a past medical history of Dementia (HCC), Diabetes mellitus without complication (HCC), Hypertension, and Stroke (HCC). who presents with  altered mental status.     Per Dr. Selina Cooley, who evaluated by video as a code stroke yesterday at [facility]: This is a 67 year old gentleman with past medical history significant for dementia, diabetes, hypertension, prior stroke with residual left upper extremity greater than left lower extremity weakness who is brought in by EMS with altered mental status.  History was obtained from the unit manager where he resides at [facility] facility.  Last known well was 7 PM yesterday.  At 7 AM they noticed that he was less responsive than usual and felt to be cold and clammy.  They thought that the right side of his mouth might be drooping a little bit and he was not talking.  At baseline he is able to ambulate short distances with a cane but requires a wheelchair for longer distances.  He is typically oriented x 1 at baseline but conversant with staff at the facility.  He does have left upper extremity weakness from prior stroke.  On my examination he had right gaze preference and was unable to cross midline to the left, left facial droop, no movement and left upper extremity or left lower extremity.  He was oriented to self only and was able to name some objects.    His MRI brain showed no e/o acute infarct but his EEG showed right centrotemporal sharp waves which at times became rhythmic. Received keppra 20mg /kg load.  Transferred to [facility] for continuous EEG  AEDs:  Keppra 500mg  BID  ROS   Unable to ascertain due to altered mental status   Past History   Past Medical  History:  Diagnosis Date   Dementia (HCC)    Diabetes mellitus without complication (HCC)    Hypertension    Stroke [facility])     Past Surgical History:  Procedure Laterality Date   ABDOMINAL AORTOGRAM W/LOWER EXTREMITY N/A 10/04/2022   Procedure: ABDOMINAL AORTOGRAM W/LOWER EXTREMITY;  Surgeon: Chuck Hint, MD;  Location: [facility] INVASIVE CV LAB;  Service: Cardiovascular;  Laterality: N/A;   ABDOMINAL AORTOGRAM W/LOWER EXTREMITY N/A 10/08/2022   Procedure: ABDOMINAL AORTOGRAM W/LOWER EXTREMITY;  Surgeon: Leonie Douglas, MD;  Location: [facility] INVASIVE CV LAB;  Service: Cardiovascular;  Laterality: N/A;   PERIPHERAL VASCULAR INTERVENTION  10/04/2022   Procedure: PERIPHERAL VASCULAR INTERVENTION;  Surgeon: Chuck Hint, MD;  Location: [facility] INVASIVE CV LAB;  Service: Cardiovascular;;   PERIPHERAL VASCULAR INTERVENTION Left 10/08/2022   Procedure: PERIPHERAL VASCULAR INTERVENTION;  Surgeon: Leonie Douglas, MD;  Location: [facility] INVASIVE CV LAB;  Service: Cardiovascular;  Laterality: Left;   TONSILLECTOMY      Family History: History reviewed. No pertinent family history.  Social History  reports that he has been smoking cigarettes. He does not have any smokeless tobacco history on file. He reports that he does not currently use alcohol. He reports that he does not currently use drugs.  Allergies  Allergen Reactions   Amoxicillin Other (See Comments)    Unknown reaction per facility   Ampicillin Other (See Comments)    Unknown reaction per facility   Tetracyclines & Related  Rash    Medications   Current Facility-Administered Medications:    0.9 %  sodium chloride infusion, 250 mL, Intravenous, PRN, Shahmehdi, Seyed A, MD   acetaminophen (TYLENOL) tablet 650 mg, 650 mg, Oral, Q6H PRN **OR** acetaminophen (TYLENOL) suppository 650 mg, 650 mg, Rectal, Q6H PRN, Shahmehdi, Seyed A, MD   atorvastatin (LIPITOR) tablet 80 mg, 80 mg, Oral, QHS, Shahmehdi, Seyed A, MD   bisacodyl  (DULCOLAX) EC tablet 5 mg, 5 mg, Oral, Daily PRN, Shahmehdi, Seyed A, MD   haloperidol lactate (HALDOL) injection 2 mg, 2 mg, Intravenous, Q6H PRN, Shahmehdi, Seyed A, MD   heparin injection 5,000 Units, 5,000 Units, Subcutaneous, Q8H, Shahmehdi, Seyed A, MD, 5,000 Units at 07/02/23 0539   HYDROmorphone (DILAUDID) injection 0.5-1 mg, 0.5-1 mg, Intravenous, Q2H PRN, Shahmehdi, Seyed A, MD   insulin aspart (novoLOG) injection 0-6 Units, 0-6 Units, Subcutaneous, TID WC, Shahmehdi, Seyed A, MD   ipratropium (ATROVENT) nebulizer solution 0.5 mg, 0.5 mg, Nebulization, Q6H PRN, Shahmehdi, Seyed A, MD   levETIRAcetam (KEPPRA) IVPB 500 mg/100 mL premix, 500 mg, Intravenous, Q12H, Sundil, Subrina, MD, Last Rate: 400 mL/hr at 07/02/23 0239, 500 mg at 07/02/23 0239   LORazepam (ATIVAN) injection 1 mg, 1 mg, Intravenous, Q6H PRN, Shahmehdi, Seyed A, MD   nicotine (NICODERM CQ - dosed in mg/24 hours) patch 14 mg, 14 mg, Transdermal, Daily, Shahmehdi, Seyed A, MD, 14 mg at 07/02/23 0902   ondansetron (ZOFRAN) tablet 4 mg, 4 mg, Oral, Q6H PRN **OR** ondansetron (ZOFRAN) injection 4 mg, 4 mg, Intravenous, Q6H PRN, Shahmehdi, Seyed A, MD   oxyCODONE (Oxy IR/ROXICODONE) immediate release tablet 5 mg, 5 mg, Oral, Q4H PRN, Shahmehdi, Seyed A, MD   pantoprazole (PROTONIX) injection 40 mg, 40 mg, Intravenous, Q24H, Shahmehdi, Seyed A, MD   senna-docusate (Senokot-S) tablet 1 tablet, 1 tablet, Oral, QHS PRN, Shahmehdi, Seyed A, MD   sodium chloride flush (NS) 0.9 % injection 3 mL, 3 mL, Intravenous, Q12H, Shahmehdi, Seyed A, MD, 3 mL at 07/02/23 0901   sodium chloride flush (NS) 0.9 % injection 3 mL, 3 mL, Intravenous, Q12H, Shahmehdi, Seyed A, MD, 3 mL at 07/02/23 0901   sodium chloride flush (NS) 0.9 % injection 3 mL, 3 mL, Intravenous, PRN, Shahmehdi, Seyed A, MD   sodium phosphate (FLEET) enema 1 enema, 1 enema, Rectal, Once PRN, Shahmehdi, Seyed A, MD  Vitals   Vitals:   07/01/23 2221 07/02/23 0400 07/02/23 0642  07/02/23 0910  BP: (!) 179/86 (!) 161/94  (!) 166/98  Pulse: (!) 107   (!) 133  Resp: 16 19    Temp: 99.7 F (37.6 C) 100.3 F (37.9 C)  98.6 F (37 C)  TempSrc: Oral Oral  Oral  SpO2: 95% 95%  93%  Weight:   62.5 kg   Height:        Body mass index is 20.35 kg/m.  Physical Exam   Constitutional: Appears chronically ill, elderly, NAD Psych: Flat affect Eyes: No scleral injection.  HENT: No OP obstruction.  Head: Normocephalic.  Cardiovascular: Normal rate and regular rhythm.  Respiratory: Effort normal, non-labored breathing.  GI: Soft.  No distension. There is no tenderness.  Skin: WDI.   Neurologic Examination   Neuro: Mental Status: Patient is awake with eyes open.  He does track bilaterally.  Speech is sparse.  He responds to his name but does not answer questions Cranial Nerves: II: Blinks to threat on the right. Pupils are equal, round, and reactive to light.   III,IV, VI:  Right graze preference  V: Facial sensation is symmetric to temperature VII: Left facial droop VIII: Hearing is intact to voice X: Palate elevates symmetrically XI: Shoulder shrug is symmetric. XII: Tongue protrudes midline without atrophy or fasciculations.  Motor: Tone is increased. Bulk is poor RUE and RLE drift to bed. LUE and LLE no movement to command or noxious stimuli  Sensory: Localizes to noxious stimuli in all extremities  Cerebellar: Unable to complete  On later attending examination at 6 PM, the patient is much more conversant although speech is more sparse.  He is able to state that he is not having any pain and that he is doing "alright", he is able to count fingers in all 4 quadrants except the right upper quadrant which he replies "I do not know" no clear facial droop.  Hearing intact to voice.  Tongue midline.  Only a slight right gaze preference, tracks fully to the left.  Chronic left hemiparesis, though he does have diffuse atrophy.  He is able to give a high-five with  the right upper extremity.  There is no drift of the right upper extremity.  He is not fully antigravity with the left upper extremity.  He is able to lift the right lower extremity slightly off the bed but not fully antigravity.  He reports sensation is intact in all 4 extremities   Labs/Imaging/Neurodiagnostic studies   CBC:  Recent Labs  Lab 07-05-2023 1236  WBC 20.7*  NEUTROABS 16.3*  HGB 13.6  HCT 44.1  MCV 94.8  PLT 263    Basic Metabolic Panel:  Lab Results  Component Value Date   NA 138 07/05/2023   K 5.1 2023-07-05   CO2 23 2023-07-05   GLUCOSE 137 (H) July 05, 2023   BUN 23 Jul 05, 2023   CREATININE 1.23 05-Jul-2023   CALCIUM 8.7 (L) Jul 05, 2023   GFRNONAA >60 05-Jul-2023    Lipid Panel:  Lab Results  Component Value Date   LDLCALC 40 10/05/2022    HgbA1c:  Lab Results  Component Value Date   HGBA1C 5.4 07-05-23    Urine Drug Screen:     Component Value Date/Time   LABOPIA NONE DETECTED 07/05/2023 1127   COCAINSCRNUR NONE DETECTED 07-05-23 1127   LABBENZ NONE DETECTED 2023-07-05 1127   AMPHETMU NONE DETECTED 05-Jul-2023 1127   THCU NONE DETECTED 2023-07-05 1127   LABBARB NONE DETECTED 2023-07-05 1127     Alcohol Level     Component Value Date/Time   ETH <10 July 05, 2023 1236    INR  Lab Results  Component Value Date   INR 1.2 2023-07-05    APTT  Lab Results  Component Value Date   APTT 29 02/27/2023    AED levels: No results found for: "PHENYTOIN", "ZONISAMIDE", "LAMOTRIGINE", "LEVETIRACETA"    CT Head without contrast(Personally reviewed):  CT angio Head and Neck with contrast(Personally reviewed): 1. No acute intracranial process. ASPECTS is 10. 2. No intracranial large vessel occlusion. 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 Moderate stenosis right cavernous and bilateral supraclinoid ICA and more proximal left V4 3. 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 The left vertebral artery is poorly opacified at its origin and in the left V1 and proximal V2 segments, with improved opacification distally, likely reflecting retrograde flow to the left subclavian this territory.  4. No hemodynamically significant stenosis in the neck. 5. No evidence of infarct core or penumbra on  CT perfusion. 6. Aortic Atherosclerosis (ICD10-I70.0) and Emphysema (ICD10-J43.9).  MRI Brain(Personally reviewed): No acute intracranial process. No evidence of acute or subacute infarct. Hemosiderin deposition in the right frontal and parietal lobes, associated with an area of encephalomalacia, likely petechial hemorrhage from a remote MCA territory infarct. Advanced cerebral volume loss for age  Neurodiagnostics rEEG:  ABNORMALITY - Sharp wave, right centro-temporal region - Continuous slow, generalized and lateralized right hemisphere IMPRESSION: This study showed evidence of epileptogenicity arising from right centro-temporal region. Additionally there was cortical dysfunction arising from right hemisphere likely secondary to underlying structural abnormality, post-ictal state. Lastly there was moderate diffuse encephalopathy. No definite seizures  were seen throughout the recording.  ASSESSMENT   Jacob Malkiel Flagg. is a 67 y.o. male  has a past medical history of Dementia (HCC), Diabetes mellitus without complication (HCC), Hypertension, and Stroke (HCC).  Presenting initially as a code stroke. Transferred to [facility] for LTM. Loaded with keppra 20mg /kg/hr and continued on 500mg  BID  Exam continues to improve today so far EEG is negative.  Diagnosis is post stroke focal epilepsy, will need long-term antiseizure medications.  Likely lowered seizure threshold in the setting of community-acquired pneumonia.  Appreciate primary team treatment of the same and management of other comorbidities  RECOMMENDATIONS  - Continue keppra 500mg  BID -  Continue LTM today, if patient's clinical exam remains stable/improving tomorrow and EEG remains negative for definite seizures we will plan to discontinue it - Seizure precautions will need to be reviewed with facility - Neurology will follow along for LTM report tomorrow.  If primary team continues to feel the patient is improving/clinically stable without any other clinical concern for seizure activity and EEG report remains negative we will sign off. - Please do not hesitate to contact us with additional questions or concerns, discussed with primary team via secure chat ______________________________________________________________________    Patient seen and examined by NP/APP with MD. MD to update note as needed.   Elmer Picker, DNP, FNP-BC [facility] Pager: [phone]  Attending Neurologist's note:  I personally saw this patient, gathering history, performing a full neurologic examination, reviewing relevant labs, personally reviewing relevant imaging including MRI brain, and formulated the assessment and plan, adding the note above for completeness and clarity to accurately reflect my thoughts   Brooke Dare MD-PhD [facility] [phone] Available 7 AM to 7 PM, outside these hours please contact Neurologist on call listed on AMION   Greater than 50 minutes spent in care of patient, majority at bedside in evaluation and reevaluation to confirm improvement

## 2023-07-02 NOTE — Evaluation (Signed)
Clinical/Bedside Swallow Evaluation Patient Details  Name: Nachman Guttery. MRN: [id number] Date of Birth: 1956-06-13  Today's Date: 07/02/2023 Time: SLP Start Time (ACUTE ONLY): 1517 SLP Stop Time (ACUTE ONLY): 1532 SLP Time Calculation (min) (ACUTE ONLY): 15 min  Past Medical History:  Past Medical History:  Diagnosis Date   Dementia (HCC)    Diabetes mellitus without complication (HCC)    Hypertension    Stroke [facility])    Past Surgical History:  Past Surgical History:  Procedure Laterality Date   ABDOMINAL AORTOGRAM W/LOWER EXTREMITY N/A 10/04/2022   Procedure: ABDOMINAL AORTOGRAM W/LOWER EXTREMITY;  Surgeon: Chuck Hint, MD;  Location: [facility] INVASIVE CV LAB;  Service: Cardiovascular;  Laterality: N/A;   ABDOMINAL AORTOGRAM W/LOWER EXTREMITY N/A 10/08/2022   Procedure: ABDOMINAL AORTOGRAM W/LOWER EXTREMITY;  Surgeon: Leonie Douglas, MD;  Location: [facility] INVASIVE CV LAB;  Service: Cardiovascular;  Laterality: N/A;   PERIPHERAL VASCULAR INTERVENTION  10/04/2022   Procedure: PERIPHERAL VASCULAR INTERVENTION;  Surgeon: Chuck Hint, MD;  Location: [facility] INVASIVE CV LAB;  Service: Cardiovascular;;   PERIPHERAL VASCULAR INTERVENTION Left 10/08/2022   Procedure: PERIPHERAL VASCULAR INTERVENTION;  Surgeon: Leonie Douglas, MD;  Location: [facility] INVASIVE CV LAB;  Service: Cardiovascular;  Laterality: Left;   TONSILLECTOMY     HPI:  67 year old male from SNF Presented 07/01/23 to the ED with change in mental status. Lt hemiplegia, rt gaze preference, NIHSS 25; CT head and MRI brain no acute changes. EEG Rt centrotemporal epileptogenicity. Etiology unclear but possibly multifactorial given possible pneumonia, UTI and seizure.  Chest x-ray with heterogeneous opacities right pericardiac region. PMH significant history of dementia, DM 2, neuropathy, HTN, HDL, GERD, depression, anxiety, PVD,CVA with residual left sided weakness.    Assessment / Plan / Recommendation  Clinical Impression   Patient presents with a functional oropharyngeal swallow. Mastication of regular texture solids moderately prolonged but with eventual full oral clearance. Vocal quality decreased in intensity at baseline, mildly hoarse and with intermittent subtle wet vocal quality with one time throat clear post intake of thin liquids. Otherwise, patient without overt s/s of aspiration and with independently use of small controlled boluses during self feeding. Will initiate a conservative diet given potential fluctuations in mentation as patient being monitored for seizures and f/u for tolerance and potential to advance. SLP Visit Diagnosis: Dysphagia, unspecified (R13.10)    Aspiration Risk  Mild aspiration risk    Diet Recommendation Dysphagia 1 (Puree);Thin liquid    Liquid Administration via: Cup;Straw Medication Administration: Whole meds with puree Supervision: Patient able to self feed;Full supervision/cueing for compensatory strategies Compensations: Small sips/bites;Slow rate Postural Changes: Seated upright at 90 degrees    Other  Recommendations Oral Care Recommendations: Oral care BID    Recommendations for follow up therapy are one component of a multi-disciplinary discharge planning process, led by the attending physician.  Recommendations may be updated based on patient status, additional functional criteria and insurance authorization.  Follow up Recommendations No SLP follow up         Functional Status Assessment Patient has had a recent decline in their functional status and demonstrates the ability to make significant improvements in function in a reasonable and predictable amount of time.  Frequency and Duration min 2x/week  1 week       Prognosis Prognosis for improved oropharyngeal function: Good      Swallow Study   General HPI: 67 year old male from SNF Presented 07/01/23 to the ED with change in mental status. Lt hemiplegia, rt gaze preference,  NIHSS 25; CT head and MRI  brain no acute changes. EEG Rt centrotemporal epileptogenicity. Etiology unclear but possibly multifactorial given possible pneumonia, UTI and seizure.  Chest x-ray with heterogeneous opacities right pericardiac region. PMH significant history of dementia, DM 2, neuropathy, HTN, HDL, GERD, depression, anxiety, PVD,CVA with residual left sided weakness. Type of Study: Bedside Swallow Evaluation Previous Swallow Assessment: none seen in chart Diet Prior to this Study: NPO Temperature Spikes Noted: No Respiratory Status: Nasal cannula History of Recent Intubation: No Behavior/Cognition: Alert;Cooperative;Pleasant mood;Requires cueing Oral Cavity Assessment: Within Functional Limits Oral Care Completed by SLP: No Oral Cavity - Dentition: Edentulous (patient would not open mouth fully to view but appears edentulous) Vision: Functional for self-feeding Self-Feeding Abilities: Able to feed self Patient Positioning: Upright in bed Baseline Vocal Quality: Low vocal intensity (possible subtle wet vocal quality) Volitional Cough: Weak Volitional Swallow: Able to elicit    Oral/Motor/Sensory Function Overall Oral Motor/Sensory Function: Generalized oral weakness   Ice Chips Ice chips: Within functional limits Presentation: Spoon   Thin Liquid Thin Liquid: Within functional limits Presentation: Straw;Cup;Self Fed    Nectar Thick Nectar Thick Liquid: Not tested   Honey Thick Honey Thick Liquid: Not tested   Puree Puree: Within functional limits Presentation: Spoon   Solid     Solid: Impaired Presentation: Self Fed Oral Phase Impairments: Impaired mastication Oral Phase Functional Implications: Prolonged oral transit Pharyngeal Phase Impairments: Wet Vocal Quality (subtle)     Tula Schryver MA, CCC-SLP  Tamim Skog Meryl 07/02/2023,3:48 PM

## 2023-07-02 NOTE — Plan of Care (Signed)
  Problem: Education: Goal: Knowledge of General Education information will improve Description: Including pain rating scale, medication(s)/side effects and non-pharmacologic comfort measures Outcome: Not Progressing   Problem: Health Behavior/Discharge Planning: Goal: Ability to manage health-related needs will improve Outcome: Not Progressing   

## 2023-07-02 NOTE — Consult Note (Incomplete)
Consultation Note Date: 07/02/2023   Patient Name: Jacob Mcgrath.  DOB: April 14, 1956  MRN: [id number]  Age / Sex: 67 y.o., male  PCP: Benetta Spar, MD Referring Physician: [place], Eric J, DO  Reason for Consultation: Establishing goals of care  HPI/Patient Profile: 67 y.o. male  with past medical history of DM 2, neuropathy, HTN, HDL, GERD, depression, anxiety, PVD with ischemic limb, chronically on aspirin and Plavix with history of previous stroke, contracted left hand at baseline admitted on 07/01/2023 with altered mental status.   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 EEG showed right centrotemporal sharp waves which at times became rhythmic. CT and MRI negative for stroke. He was transferred to [facility] for continuous EEG.  PMT has been consulted to assist with CODE STATUS conversation.  Clinical Assessment and Goals of Care:  I have reviewed medical records including EPIC notes, labs and imaging, ***, assessed the patient and then *** with *** to discuss diagnosis prognosis, GOC, EOL wishes, disposition and options.  I introduced Palliative Medicine as specialized medical care for people living with serious illness. It focuses on providing relief from the symptoms and stress of a serious illness. The goal is to improve quality of life for both the patient and the family.  We discussed a brief life review of the patient and then focused on their current illness. ***  I attempted to elicit values and goals of care important to the patient.    Medical History Review and Understanding:  ***  Social History: ***  Functional and Nutritional State: At baseline he is able to ambulate short distances with a cane but requires a wheelchair for longer distances. He is typically oriented x 1 at baseline but conversant with staff  at the facility. He does have left upper extremity weakness from prior stroke.   Palliative Symptoms: ***  Advance Directives: A detailed discussion regarding advanced directives was had. ***   Code Status: ***  Discussion: ***  The difference between aggressive medical intervention and comfort care was considered in light of the patient's goals of care. Hospice and Palliative Care services outpatient were explained and offered.   Discussed the importance of continued conversation with family and the medical providers regarding overall plan of care and treatment options, ensuring decisions are within the context of the patient's values and GOCs.   Questions and concerns were addressed.  Hard Choices booklet left for review. The family was encouraged to call with questions or concerns.  PMT will continue to support holistically.   SUMMARY OF RECOMMENDATIONS   -  Prognosis:  {Palliative Care Prognosis:23504}  Discharge Planning: {Palliative dispostion:23505}      Primary Diagnoses: Present on Admission:  Stroke (HCC)  GERD (gastroesophageal reflux disease)  Tremor  Ischemic leg  Hypertension  Depression  Leukocytosis  Physical Exam Vitals and nursing note reviewed.    Vital Signs: BP (!) 161/94 (BP Location: Left Arm)   Pulse (!) 107   Temp 100.3 F (37.9 C) (Oral)   Resp 19   Ht 5\' 9"  (1.753 m)   Wt 62.5 kg   SpO2 95%   BMI 20.35 kg/m  Pain Scale: 0-10   Pain Score: 0-No pain   SpO2: SpO2: 95 % O2 Device:SpO2: 95 % O2 Flow Rate: .O2 Flow Rate (L/min): 2 L/min   Palliative Assessment/Data:      Total time: I spent *** minutes in the care of the  patient today in the above activities and documenting the encounter.  MDM:   Rico Ala, PA-C  Palliative Medicine Team Team phone # [phone]  Thank you for allowing the Palliative Medicine Team to assist in the care of this patient. Please utilize secure chat with additional  questions, if there is no response within 30 minutes please call the above phone number.  Palliative Medicine Team providers are available by phone from 7am to 7pm daily and can be reached through the team cell phone.  Should this patient require assistance outside of these hours, please call the patient's attending physician.

## 2023-07-02 NOTE — Progress Notes (Signed)
    Referral received for Jacob Mcgrath. :goals of care discussion. Chart reviewed in detail. Patient assessed and is unable to engage appropriately in discussions. Attempted to contact patient's legal Guardian Esaw Dace at [phone]. Unable to reach. Voicemail left with contact information given.   PMT will re-attempt to contact family at a later time/date. Detailed note and recommendations to follow once GOC has been completed.   Thank you for your referral and allowing PMT to assist in [name]'s care.   Richardson Dopp, [facility] Palliative Medicine Team  Team Phone # [phone]   NO CHARGE

## 2023-07-02 NOTE — Progress Notes (Signed)
PROGRESS NOTE    Jacob Mcgrath.  AOZ:[id number] DOB: 12/08/1955 DOA: 07/01/2023 PCP: Benetta Spar, MD    Brief Narrative:   Jacob Mcgrath. is a 67 y.o. 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  History obtained from unit manager at his facility.  Last known normal 7 PM day prior to admission.  At 7 AM they noticed that he was less responsive than usual and felt to be cold and clammy.  Also thought the right side of his mouth was drooping and he was not talking.  At baseline able to ambulate short distances with a cane but requires a wheelchair for longer distance.  He is typically oriented x 1 but conversant with staff at the facility.  Patient not able to provide any further history.  In the ED, temperature 98.6 F, HR 99, RR 16, BP 150/94, SpO2 84% on room air and placed on 4 L nasal cannula.  WBC 20.7, hemoglobin 13.6, platelet count 263.  Sodium 138, potassium 5.1, chloride 104, CO2 23, glucose 137, BUN 33, creat 1.23.  AST 53, ALT 25, total bilirubin 0.5.  Lactic acid 2.4.  Procalcitonin 12.62.  INR 1.2.  Urinalysis with large leukocytes, negative nitrite, rare bacteria, 21-50 WBCs.  UDS negative.  EtOH level less than 10.  Chest x-ray with heterogeneous opacities right pericardiac region.  Abdomen x-ray with nonspecific bowel gas pattern with scattered stool.  CT head without contrast with no acute intracranial process.  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  MRI brain without contrast with no acute intracranial process.  Neurology was consulted and given concern for seizure patient was transferred to [facility] for EEG.  Assessment & Plan:   Acute metabolic encephalopathy Patient presenting from SNF with confusion, concern for right-sided facial droop and being less responsive.  Typically is alert and oriented x 1 and more conversant.  Does have history of CVA; no reported history of seizures but is on Keppra outpatient.  Patient was afebrile, noted to be hypoxic with SpO2 84% on room air on arrival with elevated WC count of 20K, lactic acid 2.4, elevated procalcitonin and chest x-ray with concerning findings for possible pneumonia and possible UTI on urinalysis.  TSH within normal limits.  Also concern for seizure activity and patient was transferred to [facility] for EEG.  Etiology unclear but possibly multifactorial given possible pneumonia, UTI and seizure. 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 CT head without contrast with no acute intracranial process.  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  MRI brain without contrast with no acute intracranial process. -- Neurology following, appreciate assistance -- EEG with evidence of epileptogenicity  right central temporal region, cortical dysfunction right hemisphere likely secondary to underlying structural abnormality/postictal state, moderate diffuse encephalopathy, no definite seizures -- Keppra 500 mg IV every 12  hours -- PT/OT/SLP evaluation -- Seizure precautions  Community-acquired pneumonia Patient afebrile, hypoxic on admission with SpO2 84% on room air and placed on supple oxygen.  WBC count elevated 20.7 with elevated lactic acid and procalcitonin.  Chest x-ray with heterogeneous opacities right pericardiac region. -- Azithromycin 5 mg p.o. every 24 hours x 5 days -- Ceftriaxone 2 g IV every 24 hours x 5 days -- Continue to trend WBC, procalcitonin; repeat lactic acid in the a.m. -- Aspiration precautions  Essential hypertension -- Metoprolol tartrate 25 g p.o. twice daily  Hyperlipidemia -- Atorvastatin 80 mg p.o. daily  Type 2 diabetes mellitus Hemoglobin A1c 5.4, well-controlled. -- Very sensitive SSI for coverage  Dementia Patient currently resides at [facility], usually alert and oriented x 1. --Palliative care following for goals of care and medical decision making   DVT prophylaxis: heparin injection 5,000 Units Start: 07/01/23 2200 TED hose Start: 07/01/23 1309 SCDs Start: 07/01/23 1309    Code Status: Full Code Family Communication: No family present at bedside this morning  Disposition Plan:  Level of care: Telemetry Medical Status is: Inpatient Remains inpatient appropriate because: Pains on EEG, awaiting further recommendations from neurology, awaiting PT/OT/SLP evaluation    Consultants:  Neurology Palliative care  Procedures:  EEG  Antimicrobials:  Azithromycin 12/7>> Ceftriaxone 12/7>>   Subjective: Patient seen examined bedside, lying in bed.  Continues on EEG.  Alert to self.  Denies pain, no shortness of breath, no fever, no chest pain.  Unable to obtain any further ROS given his underlying dementia.  No acute events overnight per nursing staff.  Objective: Vitals:   07/01/23 2221 07/02/23 0400 07/02/23 0642 07/02/23 0910  BP: (!) 179/86 (!) 161/94  (!) 166/98  Pulse: (!) 107   (!) 133  Resp: 16 19    Temp: 99.7 F (37.6 C) 100.3 F  (37.9 C)  98.6 F (37 C)  TempSrc: Oral Oral  Oral  SpO2: 95% 95%  93%  Weight:   62.5 kg   Height:        Intake/Output Summary (Last 24 hours) at 07/02/2023 1054 Last data filed at 07/02/2023 0440 Gross per 24 hour  Intake 6 ml  Output 700 ml  Net -694 ml   Filed Weights   07/01/23 1352 07/02/23 0642  Weight: 57.6 kg 62.5 kg    Examination:  Physical Exam: GEN: NAD, alert and oriented to self, chronically ill/elderly in appearance, appears older than stated age HEENT: NCAT, PERRL, EOMI, sclera clear, MMM PULM: Breath sounds slight diminished bilateral bases, no wheezing/crackles, normal respiratory effort that accessory muscle use, on 3 L nasal cannula CV: RRR w/o M/G/R GI: abd soft, NTND, NABS, no R/G/M MSK: no peripheral edema, moves all extremities independently NEURO: CN II-XII intact, + weakness left UE/LE greater than right PSYCH: Depressed mood, flat affect Integumentary: No concerning rashes/lesions/wounds noted on exposed skin surfaces    Data Reviewed: I have personally reviewed following labs and imaging studies  CBC: Recent Labs  Lab 07/01/23 1236  WBC 20.7*  NEUTROABS 16.3*  HGB 13.6  HCT 44.1  MCV 94.8  PLT 263   Basic Metabolic Panel: Recent Labs  Lab 07/01/23 1236  NA 138  K 5.1  CL 104  CO2 23  GLUCOSE 137*  BUN 23  CREATININE 1.23  CALCIUM 8.7*  MG 1.9  PHOS 5.2*   GFR: Estimated Creatinine Clearance: 51.5 mL/min (by C-G formula based on SCr of 1.23 mg/dL). Liver Function Tests: Recent Labs  Lab 07/01/23 1236  AST 53*  ALT 25  ALKPHOS 79  BILITOT 0.5  PROT 7.1  ALBUMIN 3.5   No results for input(s): "LIPASE", "AMYLASE" in the last 168 hours. No results for input(s): "AMMONIA" in the last 168 hours. Coagulation Profile: Recent Labs  Lab 07/01/23 1236  INR 1.2   Cardiac Enzymes: No results for input(s): "CKTOTAL", "CKMB", "CKMBINDEX", "TROPONINI" in the last 168 hours. BNP (last 3 results) No results for input(s):  "PROBNP" in the last 8760 hours. HbA1C: Recent Labs    07/01/23 1236  HGBA1C 5.4   CBG: Recent Labs  Lab 07/01/23 1134 07/01/23 1638 07/01/23 2035 07/01/23 2237 07/02/23 0621  GLUCAP 123* 124* 160* 135* 135*   Lipid Profile: No results for input(s): "CHOL", "HDL", "LDLCALC", "TRIG", "CHOLHDL", "LDLDIRECT" in the last 72 hours. Thyroid Function Tests: Recent Labs    07/01/23 1236  TSH 2.060   Anemia Panel: No results for input(s): "VITAMINB12", "FOLATE", "FERRITIN", "TIBC", "IRON", "RETICCTPCT" in the last 72 hours. Sepsis Labs: Recent Labs  Lab 07/01/23 1236 07/01/23 1405 07/02/23 0759  PROCALCITON 12.62  --  7.11  LATICACIDVEN  --  2.4* 1.7    No results found for this or any previous visit (from the past 240 hour(s)).       Radiology Studies: Overnight EEG with video  Result Date: 07/02/2023 Charlsie Quest, MD     07/02/2023  8:18 AM Patient Name: Jafar Clowser. MRN: [id number] Epilepsy Attending: Charlsie Quest Referring Physician/Provider: Erick Blinks, MD Duration: 07/02/2023 0052 to 0800 Patient history: 67yo M with left sided weakness getting eeg to evaluate for seizure Level of alertness: Awake AEDs during EEG study: LEV Technical aspects: This EEG study was done with scalp electrodes positioned according to the 10-20 International system of electrode placement. Electrical activity was reviewed with band pass filter of 1-70Hz , sensitivity of 7 uV/mm, display speed of 49mm/sec with a 60Hz  notched filter applied as appropriate. EEG data were recorded continuously and digitally stored.  Video monitoring was available and reviewed as appropriate. Description: No clear posterior dominant rhythm was seen. EEG showed continuous generalized and lateralized right hemisphere 3 to 6 Hz theta-delta slowing in right hemisphere. Abundant sharp waves were noted in right centro-temporal region. Hyperventilation and photic stimulation were not performed.  ABNORMALITY  - Sharp wave, right centro-temporal region - Continuous slow, generalized and lateralized right hemisphere  IMPRESSION: This study showed evidence of epileptogenicity arising from right centro-temporal region. Additionally there was cortical dysfunction arising from right hemisphere likely secondary to underlying structural abnormality, post-ictal state. Lastly there was moderate diffuse encephalopathy. No definite seizures  were seen throughout the recording.  Charlsie Quest   EEG adult  Result Date: 07/01/2023 Charlsie Quest, MD     07/01/2023  5:21 PM Patient Name: Kunaal Malave. MRN: [id number] Epilepsy Attending: Charlsie Quest Referring Physician/Provider: Kendell Bane, MD Date: 07/01/2023 Duration: 24.21 mins Patient history: 67yo M with  left sided weakness getting eeg to evaluate for seizure Level of alertness: Awake AEDs during EEG study: None Technical aspects: This EEG study was done with scalp electrodes positioned according to the 10-20 International system of electrode placement. Electrical activity was reviewed with band pass filter of 1-70Hz , sensitivity of 7 uV/mm, display speed of 66mm/sec with a 60Hz  notched filter applied as appropriate.  EEG data were recorded continuously and digitally stored.  Video monitoring was available and reviewed as appropriate. Description: The posterior dominant rhythm consists of 8 Hz activity of moderate voltage (25-35 uV) seen predominantly in posterior head regions, asymmetric ( right<left) and reactive to eye opening and eye closing. EEG showed continuous 3 to 6 Hz theta-delta slowing in right hemisphere. Sharp waves were noted in right centro-temporal region. Intermittent generalized 3-5hz  theta-delta slowing was also noted. Hyperventilation and photic stimulation were not performed.   Of note, parts of study were difficult to interpret due to significant myogenic artifact. ABNORMALITY - Sharp wave, right centro-temporal region - Continuous  slow, right hemisphere - Intermittent slow, generalized - Background asymmetry, right<left IMPRESSION: This technically difficult study showed evidence of epileptogenicity arising from right centro-temporal region. Additionally there is cortical dysfunction arising from right hemisphere likely secondary to underlying structural abnormality, post-ictal state. Lastly there was  mild diffuse encephalopathy. No definite seizures  were seen throughout the recording. Charlsie Quest   MR BRAIN WO CONTRAST  Result Date: 07/01/2023 CLINICAL DATA:  Altered mental status, neglect, stroke suspected EXAM: MRI HEAD WITHOUT CONTRAST TECHNIQUE: Multiplanar, multiecho pulse sequences of the brain and surrounding structures were obtained without intravenous contrast. COMPARISON:  01/03/2023 MRI head, correlation is also made with 07/01/2023 CT head FINDINGS: Brain: No restricted diffusion to suggest acute or subacute infarct. No acute hemorrhage, mass, mass effect, or midline shift. No hydrocephalus or extra-axial collection. Craniocervical junction within normal limits. Hemosiderin deposition in the right frontal and parietal lobes, associated with an area of encephalomalacia, likely petechial hemorrhage from a remote MCA territory infarct. Advanced cerebral volume loss for age, with ex vacuo dilatation of the ventricles. Ventricles and sulci are commensurate in size Vascular: Loss of the proximal left V4 flow void (series 10, image 5), which is new from the prior MRI but may correlate with an area of stenosis on the same-day CTA. Otherwise normal flow voids. Skull and upper cervical spine: Normal marrow signal. Sinuses/Orbits: Mucous retention cyst in the right maxillary sinus and posterior right ethmoid air cells. No acute finding in the orbits. Other: The mastoid air cells are well aerated. IMPRESSION: No acute intracranial process. No evidence of acute or subacute infarct. Electronically Signed   By: Wiliam Ke M.D.    On: 07/01/2023 16:13   ECHOCARDIOGRAM COMPLETE  Result Date: 07/01/2023    ECHOCARDIOGRAM REPORT   Patient Name:   Lorene Ferrari. Date of Exam: 07/01/2023 Medical Rec #:  [id number]          Height:       69.0 in Accession #:    [id number]         Weight:       127.0 lb Date of Birth:  08-01-1955         BSA:          1.703 m Patient Age:    88 years           BP:           165/90 mmHg Patient Gender: M                  HR:           96 bpm. [facility] Procedure: 2D Echo, Color Doppler, Cardiac Doppler and Intracardiac            Opacification Agent Indications:    Stroke I63.9  History:  Patient has no prior history of Echocardiogram examinations.                 Stroke; Risk Factors:Hypertension and Diabetes.  Sonographer:    Celesta Gentile RCS Referring Phys: [phone] SEYED A SHAHMEHDI IMPRESSIONS  1. Left ventricular ejection fraction, by estimation, is 55 to 60%. The left ventricle has normal function. Left ventricular endocardial border not optimally defined to evaluate regional wall motion - basal septum and basal posterior wall dyssynchronous  but contractile. Left ventricular diastolic parameters are consistent with Grade I diastolic dysfunction (impaired relaxation).  2. Definity contrast shows no LV mural thrombus.  3. Right ventricular systolic function is low normal. The right ventricular size is normal. There is normal pulmonary artery systolic pressure. The estimated right ventricular systolic pressure is 29.6 mmHg.  4. The mitral valve is degenerative. Trivial mitral valve regurgitation.  5. The aortic valve has an indeterminant number of cusps. There is moderate calcification of the aortic valve. Aortic valve regurgitation is not visualized. Aortic valve sclerosis/calcification is present, without any evidence of aortic stenosis.  6. The inferior vena cava is normal in size with greater than 50% respiratory variability, suggesting right atrial pressure of 3 mmHg.  Comparison(s): No prior Echocardiogram. FINDINGS  Left Ventricle: Left ventricular ejection fraction, by estimation, is 55 to 60%. The left ventricle has normal function. Left ventricular endocardial border not optimally defined to evaluate regional wall motion. Definity contrast agent was given IV to delineate the left ventricular endocardial borders. The left ventricular internal cavity size was normal in size. There is no left ventricular hypertrophy. Left ventricular diastolic parameters are consistent with Grade I diastolic dysfunction (impaired relaxation). Right Ventricle: The right ventricular size is normal. No increase in right ventricular wall thickness. Right ventricular systolic function is low normal. There is normal pulmonary artery systolic pressure. The tricuspid regurgitant velocity is 2.58 m/s,  and with an assumed right atrial pressure of 3 mmHg, the estimated right ventricular systolic pressure is 29.6 mmHg. Left Atrium: Left atrial size was normal in size. Right Atrium: Right atrial size was normal in size. Pericardium: There is no evidence of pericardial effusion. Mitral Valve: The mitral valve is degenerative in appearance. There is mild calcification of the mitral valve leaflet(s). Trivial mitral valve regurgitation. Tricuspid Valve: The tricuspid valve is grossly normal. Tricuspid valve regurgitation is mild. Aortic Valve: The aortic valve has an indeterminant number of cusps. There is moderate calcification of the aortic valve. There is mild to moderate aortic valve annular calcification. Aortic valve regurgitation is not visualized. Aortic valve sclerosis/calcification is present, without any evidence of aortic stenosis. Pulmonic Valve: The pulmonic valve was not well visualized. Pulmonic valve regurgitation is trivial. Aorta: The aortic root is normal in size and structure. Venous: The inferior vena cava is normal in size with greater than 50% respiratory variability, suggesting right  atrial pressure of 3 mmHg. IAS/Shunts: No atrial level shunt detected by color flow Doppler.  LEFT VENTRICLE PLAX 2D LVIDd:         4.10 cm   Diastology LVIDs:         3.70 cm   LV e' medial:    5.44 cm/s LV PW:         0.90 cm   LV E/e' medial:  11.0 LV IVS:        0.80 cm   LV e' lateral:   8.49 cm/s LVOT diam:     1.80 cm   LV E/e' lateral: 7.0 LV  SV:         39 LV SV Index:   23 LVOT Area:     2.54 cm  RIGHT VENTRICLE RV S prime:     11.20 cm/s TAPSE (M-mode): 2.0 cm LEFT ATRIUM             Index        RIGHT ATRIUM           Index LA diam:        2.90 cm 1.70 cm/m   RA Area:     17.00 cm LA Vol (A2C):   42.4 ml 24.89 ml/m  RA Volume:   52.50 ml  30.82 ml/m LA Vol (A4C):   44.9 ml 26.36 ml/m LA Biplane Vol: 46.2 ml 27.12 ml/m  AORTIC VALVE LVOT Vmax:   95.30 cm/s LVOT Vmean:  62.600 cm/s LVOT VTI:    0.152 m  AORTA Ao Root diam: 2.70 cm MITRAL VALVE                TRICUSPID VALVE MV Area (PHT): 4.31 cm     TR Peak grad:   26.6 mmHg MV Decel Time: 176 msec     TR Vmax:        258.00 cm/s MV E velocity: 59.60 cm/s MV A velocity: 107.00 cm/s  SHUNTS MV E/A ratio:  0.56         Systemic VTI:  0.15 m                             Systemic Diam: 1.80 cm Nona Dell MD Electronically signed by Nona Dell MD Signature Date/Time: 07/01/2023/3:14:04 PM    Final    DG Abd 1 View  Result Date: 07/01/2023 CLINICAL DATA:  MRI clearance EXAM: ABDOMEN - 1 VIEW COMPARISON:  X-ray 02/28/2023 FINDINGS: Scattered colonic stool. Gas seen in nondilated loops of small and large bowel. No obstruction. Degenerative changes seen of the spine and pelvis particularly of the right hip. Left hip arthroplasty seen at the edge of the imaging field, incompletely included. There is contrast in the renal collecting systems including the bladder which appears to has a trabeculated wall with some potential bladder diverticula. There is a vascular stent overlying the left iliac region. Please correlate for time course and type of  stent. Overlapping cardiac leads. IMPRESSION: Nonspecific bowel gas pattern with scattered stool. Left hip prosthesis.  Degenerative changes. Left iliac vascular stents. Please correlate with the type and time of placement of stent. Please correlate for compatibility for MRI Electronically Signed   By: Karen Kays M.D.   On: 07/01/2023 14:47   DG Chest Portable 1 View  Result Date: 07/01/2023 CLINICAL DATA:  Hypoxia. EXAM: PORTABLE CHEST 1 VIEW COMPARISON:  02/27/2023. FINDINGS: Redemonstration of increased interstitial markings throughout bilateral lungs, grossly similar to the prior study. No frank pulmonary edema. There are heterogeneous opacities in the right paracardiac region. Differential diagnosis includes combination of vascular/interstitial markings versus superimposed atelectasis/pneumonitis. Correlate clinically. Bilateral lung fields are otherwise clear. Bilateral costophrenic angles are clear. Normal cardio-mediastinal silhouette. No acute osseous abnormalities. The soft tissues are within normal limits. IMPRESSION: *Heterogeneous opacities in the right paracardiac region, as discussed above. Electronically Signed   By: Jules Schick M.D.   On: 07/01/2023 13:56   CT HEAD CODE STROKE WO CONTRAST  Result Date: 07/01/2023 CLINICAL DATA:  Right gaze deviation, altered mental status, stroke suspected EXAM: CT ANGIOGRAPHY HEAD AND NECK CT  PERFUSION BRAIN TECHNIQUE: Multidetector CT imaging of the head and neck was performed using the standard protocol during bolus administration of intravenous contrast. Multiplanar CT image reconstructions and MIPs were obtained to evaluate the vascular anatomy. Carotid stenosis measurements (when applicable) are obtained utilizing NASCET criteria, using the distal internal carotid diameter as the denominator. Multiphase CT imaging of the brain was performed following IV bolus contrast injection. Subsequent parametric perfusion maps were calculated using RAPID  software. RADIATION DOSE REDUCTION: This exam was performed according to the departmental dose-optimization program which includes automated exposure control, adjustment of the mA and/or kV according to patient size and/or use of iterative reconstruction technique. CONTRAST:  OMNIPAQUE IOHEXOL 350 MG/ML SOLN COMPARISON:  02/27/2023 CT head FINDINGS: CT HEAD FINDINGS Brain: No evidence of acute infarction, hemorrhage, mass, mass effect, or midline shift. No hydrocephalus or extra-axial collection. 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 Partial empty sella. Normal craniocervical junction. Periventricular white matter changes, likely the sequela of chronic small vessel ischemic disease. Vascular: No hyperdense vessel. Skull: Negative for fracture or focal lesion. Sinuses/Orbits: No acute finding. Other: The mastoid air cells are well aerated. ASPECTS [facility] Stroke Program Early CT Score) - Ganglionic level infarction (caudate, lentiform nuclei, internal capsule, insula, M1-M3 cortex): 7 - Supraganglionic infarction (M4-M6 cortex): 3 Total score (0-10 with 10 being normal): 10 CTA NECK FINDINGS Aortic arch: Standard branching. Imaged portion shows no evidence of aneurysm or dissection. 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 Aortic atherosclerosis. Multifocal stenosis in the right subclavian artery is not hemodynamically significant (series 6, image 280). Right carotid system: No evidence of dissection, occlusion, or hemodynamically significant stenosis (greater than 50%). Atherosclerotic disease in the common carotid, at the bifurcation, and in the proximal ICA is not hemodynamically significant. Left carotid system:  No evidence of dissection, occlusion, or hemodynamically significant stenosis (greater than 50%). Atherosclerotic disease in the common carotid, at the bifurcation, and in the proximal ICA is not hemodynamically significant. Vertebral arteries: The left vertebral artery is poorly opacified at its origin and in the left V1 and proximal V2 segments, with improved opacification distally, likely reflecting retrograde flow. The right vertebral artery is patent from its origin to the skull base, without significant stenosis or evidence of dissection. Skeleton: No acute osseous abnormality. Degenerative changes in the cervical spine. Other neck: No acute finding. Upper chest: No focal pulmonary opacity or pleural effusion. Emphysema. Review of the MIP images confirms the above findings CTA HEAD FINDINGS Anterior circulation: Both internal carotid arteries are patent to the termini, with severe stenosis in the distal left cavernous ICA (series 6, image 119), and moderate stenosis in the right cavernous and bilateral supraclinoid ICA. A1 segments patent, hypoplastic on the left. Normal anterior communicating artery. Azygous A2. Anterior cerebral arteries are patent to their distal aspects without significant stenosis. No M1 stenosis or occlusion. MCA branches perfused to their distal aspects without significant stenosis. Posterior circulation: Vertebral arteries patent to the vertebrobasilar junction with moderate stenosis in the proximal left V4 (series 6, image 147) and severe stenosis near the vertebrobasilar junction (series 6, image 130 posterior inferior cerebellar arteries patent proximally. Basilar patent to its distal aspect without significant stenosis. Superior cerebellar arteries patent proximally. Patent P1 segments. PCAs perfused  to their distal aspects without significant stenosis. The bilateral posterior communicating arteries are patent. Venous sinuses: As permitted by contrast timing, patent. Anatomic  variants: None significant. No evidence of aneurysm or vascular malformation. Review of the MIP images confirms the above findings CT Brain Perfusion Findings: ASPECTS: 10 CBF (<30%) Volume: 0mL Perfusion (Tmax>6.0s) volume: 0mL Mismatch Volume: 0mL Infarction Location:None IMPRESSION: 1. No acute intracranial process. ASPECTS is 10. 2. No intracranial large vessel occlusion. 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 Moderate stenosis right cavernous and bilateral supraclinoid ICA and more proximal left V4 3. 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 The left vertebral artery is poorly opacified at its origin and in the left V1 and proximal V2 segments, with improved opacification distally, likely reflecting retrograde flow to the left subclavian this territory. 4. No hemodynamically significant stenosis in the neck. 5. No evidence of infarct core or penumbra on CT perfusion. 6. Aortic Atherosclerosis (ICD10-I70.0) and Emphysema (ICD10-J43.9). Code stroke imaging results were communicated on 07/01/2023 at 12:19 pm to provider Eloise Harman via telephone, who verbally acknowledged these results. Electronically Signed   By: Wiliam Ke M.D.   On: 07/01/2023 12:33   CT ANGIO HEAD NECK W WO CM W PERF (CODE STROKE)  Result Date: 07/01/2023 CLINICAL DATA:  Right gaze deviation, altered mental status, stroke suspected EXAM: CT ANGIOGRAPHY HEAD AND NECK CT PERFUSION BRAIN TECHNIQUE: Multidetector CT imaging of the head and neck was performed using the standard protocol during bolus administration of intravenous contrast. Multiplanar CT image reconstructions and MIPs were obtained to evaluate the vascular anatomy. Carotid stenosis measurements (when applicable) are obtained utilizing NASCET criteria, using the distal internal carotid diameter as the denominator. Multiphase CT imaging of the brain was performed  following IV bolus contrast injection. Subsequent parametric perfusion maps were calculated using RAPID software. RADIATION DOSE REDUCTION: This exam was performed according to the departmental dose-optimization program which includes automated exposure control, adjustment of the mA and/or kV according to patient size and/or use of iterative reconstruction technique. CONTRAST:  OMNIPAQUE IOHEXOL 350 MG/ML SOLN COMPARISON:  02/27/2023 CT head FINDINGS: CT HEAD FINDINGS Brain: No evidence of acute infarction, hemorrhage, mass, mass effect, or midline shift. No hydrocephalus or extra-axial collection. 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 Partial empty sella. Normal craniocervical junction. Periventricular white matter changes, likely the sequela of chronic small vessel ischemic disease. Vascular: No hyperdense vessel. Skull: Negative for fracture or focal lesion. Sinuses/Orbits: No acute finding. Other: The mastoid air cells are well aerated. ASPECTS [facility] Stroke Program Early CT Score) - Ganglionic level infarction (caudate, lentiform nuclei, internal capsule, insula, M1-M3 cortex): 7 - Supraganglionic infarction (M4-M6 cortex): 3 Total score (0-10 with 10 being normal): 10 CTA NECK FINDINGS Aortic arch: Standard branching. Imaged portion shows no evidence of aneurysm or dissection. 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 Aortic atherosclerosis. Multifocal stenosis in the right subclavian artery is not hemodynamically significant (series 6, image 280). Right carotid system: No evidence of dissection, occlusion, or hemodynamically significant stenosis (greater than 50%). Atherosclerotic disease in the common  carotid, at the bifurcation, and in the proximal ICA is not hemodynamically significant. Left  carotid system: No evidence of dissection, occlusion, or hemodynamically significant stenosis (greater than 50%). Atherosclerotic disease in the common carotid, at the bifurcation, and in the proximal ICA is not hemodynamically significant. Vertebral arteries: The left vertebral artery is poorly opacified at its origin and in the left V1 and proximal V2 segments, with improved opacification distally, likely reflecting retrograde flow. The right vertebral artery is patent from its origin to the skull base, without significant stenosis or evidence of dissection. Skeleton: No acute osseous abnormality. Degenerative changes in the cervical spine. Other neck: No acute finding. Upper chest: No focal pulmonary opacity or pleural effusion. Emphysema. Review of the MIP images confirms the above findings CTA HEAD FINDINGS Anterior circulation: Both internal carotid arteries are patent to the termini, with severe stenosis in the distal left cavernous ICA (series 6, image 119), and moderate stenosis in the right cavernous and bilateral supraclinoid ICA. A1 segments patent, hypoplastic on the left. Normal anterior communicating artery. Azygous A2. Anterior cerebral arteries are patent to their distal aspects without significant stenosis. No M1 stenosis or occlusion. MCA branches perfused to their distal aspects without significant stenosis. Posterior circulation: Vertebral arteries patent to the vertebrobasilar junction with moderate stenosis in the proximal left V4 (series 6, image 147) and severe stenosis near the vertebrobasilar junction (series 6, image 130 posterior inferior cerebellar arteries patent proximally. Basilar patent to its distal aspect without significant stenosis. Superior cerebellar arteries patent proximally. Patent P1 segments. PCAs perfused to their distal aspects without significant stenosis. The bilateral  posterior communicating arteries are patent. Venous sinuses: As permitted by contrast timing, patent. Anatomic variants: None significant. No evidence of aneurysm or vascular malformation. Review of the MIP images confirms the above findings CT Brain Perfusion Findings: ASPECTS: 10 CBF (<30%) Volume: 0mL Perfusion (Tmax>6.0s) volume: 0mL Mismatch Volume: 0mL Infarction Location:None IMPRESSION: 1. No acute intracranial process. ASPECTS is 10. 2. No intracranial large vessel occlusion. 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 Moderate stenosis right cavernous and bilateral supraclinoid ICA and more proximal left V4 3. 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 The left vertebral artery is poorly opacified at its origin and in the left V1 and proximal V2 segments, with improved opacification distally, likely reflecting retrograde flow to the left subclavian this territory. 4. No hemodynamically significant stenosis in the neck. 5. No evidence of infarct core or penumbra on CT perfusion. 6. Aortic Atherosclerosis (ICD10-I70.0) and Emphysema (ICD10-J43.9). Code stroke imaging results were communicated on 07/01/2023 at 12:19 pm to provider Eloise Harman via telephone, who verbally acknowledged these results. Electronically Signed   By: Wiliam Ke M.D.   On: 07/01/2023 12:33        Scheduled Meds:  atorvastatin  80 mg Oral QHS   azithromycin  500 mg Oral Daily   heparin  5,000 Units Subcutaneous Q8H   insulin aspart  0-6 Units Subcutaneous TID WC   nicotine  14 mg Transdermal Daily   pantoprazole (PROTONIX) IV  40 mg Intravenous Q24H   sodium chloride flush  3 mL Intravenous Q12H   sodium chloride flush  3 mL Intravenous Q12H   Continuous Infusions:  sodium chloride     cefTRIAXone (ROCEPHIN)  IV     levETIRAcetam 500 mg (07/02/23 0239)     LOS: 1 day    Time spent: 52 minutes spent on  chart review, discussion with nursing staff, consultants, updating family and interview/physical exam;  more than 50% of that time was spent in counseling and/or coordination of care.    Alvira Philips [place], DO [facility] Available via Epic secure chat 7am-7pm After these hours, please refer to coverage provider listed on [email or web address] 07/02/2023, 10:54 AM

## 2023-07-02 NOTE — Evaluation (Signed)
Occupational Therapy Evaluation Patient Details Name: Jacob Mcgrath. MRN: [id number] DOB: [birth year] Today's Date: 07/02/2023   History of Present Illness 67 year old male from SNF Presented 07/01/23 to the ED with change in mental status. Lt hemiplegia, rt gaze preference, NIHSS 25; CT head and MRI brain no acute changes. EEG Rt centrotemporal epileptogenicity   PMH significant history of dementia, DM 2, neuropathy, HTN, HDL, GERD, depression, anxiety, PVD,CVA   Clinical Impression   PTA, pt from SNF. Per chart, pt able to ambulate a short distance with cane though typically uses wheelchair for mobility. Pt A&Ox1 so unsure how much assist needed for ADLs. Pt limited now primarily by cognitive deficits with inconsistent command following and resistant to mobility assistance. Overall, pt requires Total A for ADLs bed level and would need +2 assist for successful OOB attempts. Will follow on a trial basis acutely to assess for functional improvements. Recommend return to SNF with follow up therapy if needed.        If plan is discharge home, recommend the following: A lot of help with walking and/or transfers;Two people to help with walking and/or transfers;A lot of help with bathing/dressing/bathroom;Two people to help with bathing/dressing/bathroom;Assistance with feeding    Functional Status Assessment  Patient has had a recent decline in their functional status and demonstrates the ability to make significant improvements in function in a reasonable and predictable amount of time.  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 However, no command following to bring LE to EOB and noted resistance when attempted to help by OT. Total A to scoot up in bed and partial roll to place pillow at side    Transfers                          Balance                                           ADL either performed or assessed with clinical judgement   ADL Overall ADL's : Needs assistance/impaired Eating/Feeding: NPO   Grooming: Total assistance;Bed level;Wash/dry face Grooming Details (indicate cue type and reason): when asked to wash face, pt will say "yes" or "ok" but despite hand over hand, multimodal cues, pt unable to initiate task Upper Body Bathing: Total assistance   Lower Body Bathing: Total assistance   Upper Body Dressing : Total assistance   Lower Body Dressing: Total assistance       Toileting- Clothing Manipulation and Hygiene: Total assistance;Bed level Toileting - Clothing Manipulation Details (indicate cue type and reason): RN finishing total A peri care bed level on OT entry       General ADL Comments: Per chart, appears pt may be below baseline but difficulty to fully engage today due to cognitive deficits     Vision Ability to See in Adequate Light: 0 Adequate Patient Visual Report: No change from baseline Vision Assessment?: No apparent visual deficits  Perception         Praxis         Pertinent Vitals/Pain Pain Assessment Pain Assessment: No/denies pain     Extremity/Trunk Assessment Upper Extremity Assessment Upper Extremity Assessment: Right hand dominant;LUE deficits/detail LUE Deficits / Details: L UE in flexed position with contractures of digits (still able to visualize palmar aspect). tightness in wrist with some PROM possible, PROM limited with elbow extension and shoulder flexion LUE Coordination: decreased fine motor;decreased gross motor   Lower Extremity Assessment Lower Extremity  Assessment: Defer to PT evaluation   Cervical / Trunk Assessment Cervical / Trunk Assessment: Normal   Communication Communication Communication: No apparent difficulties   Cognition Arousal: Alert Behavior During Therapy: Flat affect Overall Cognitive Status: History of cognitive impairments - at baseline                                 General Comments: hx of dementia, A&Ox1, poor recall of PLOF. inconsistent command following, especially with actions that involve lower extremities. resistant to movement at times.     General Comments       Exercises     Shoulder Instructions      Home Living Family/patient expects to be discharged to:: Skilled nursing facility                                 Additional Comments: From [facility] SNF      Prior Functioning/Environment Prior Level of Function : Needs assist;Patient poor historian/Family not available             Mobility Comments: pt reports walking with a cane but also using a wheelchair - this is also indicated in chart. with prior hospitalization, pt was able to stand with PT in August for pivoting ADLs Comments: pt reports able to manage ADLs but A&Ox1 so anticipate likely extensive assist needed for ADLs        OT Problem List: Decreased cognition;Impaired UE functional use;Impaired tone;Decreased coordination;Impaired balance (sitting and/or standing)      OT Treatment/Interventions: Self-care/ADL training;Therapeutic exercise;Energy conservation;DME and/or AE instruction;Therapeutic activities;Patient/family education;Balance training    OT Goals(Current goals can be found in the care plan section) Acute Rehab OT Goals Patient Stated Goal: pt unable to state OT Goal Formulation: Patient unable to participate in goal setting Time For Goal Achievement: 07/16/23 Potential to Achieve Goals: Fair ADL Goals Pt Will Perform Eating: with min assist;sitting;bed level Pt Will  Perform Grooming: with mod assist;bed level;sitting Additional ADL Goal #1: Pt to follow one step commands > 50% of the time Additional ADL Goal #2: Pt to assist with bed mobilty with no more than Mod A  OT Frequency: Min 1X/week    Co-evaluation              AM-PAC OT "6 Clicks" Daily Activity     Outcome Measure Help from another person eating meals?: Total Help from another person taking care of personal grooming?: Total Help from another person toileting, which includes using toliet, bedpan, or urinal?: Total Help from another person bathing (including washing, rinsing, drying)?: Total Help from another person to put on and taking off regular upper body clothing?: Total Help from another person to put on and taking off regular lower body clothing?: Total 6 Click Score: 6   End of Session Nurse Communication: Mobility status  Activity Tolerance:  Other (comment) (limited by cognition) Patient left: in bed;with bed alarm set;with call bell/phone within reach  OT Visit Diagnosis: Other symptoms and signs involving cognitive function;Other abnormalities of gait and mobility (R26.89)                Time: 5956-3875 OT Time Calculation (min): 16 min Charges:  OT General Charges $OT Visit: 1 Visit OT Evaluation $OT Eval Low Complexity: 1 Low  Bradd Canary, OTR/L Acute Rehab Services Office: [phone]   Lorre Munroe 07/02/2023, 10:04 AM

## 2023-07-02 NOTE — Procedures (Signed)
Patient Name: Jacob Mcgrath.  MRN: [id number]  Epilepsy Attending: Charlsie Quest  Referring Physician/Provider: Erick Blinks, MD  Duration: 07/02/2023 6295 to 07/03/2023 2841   Patient history: 67yo M with left sided weakness getting eeg to evaluate for seizure   Level of alertness: Awake, asleep  AEDs during EEG study: LEV  Technical aspects: This EEG study was done with scalp electrodes positioned according to the 10-20 International system of electrode placement. Electrical activity was reviewed with band pass filter of 1-70Hz , sensitivity of 7 uV/mm, display speed of 34mm/sec with a 60Hz  notched filter applied as appropriate. EEG data were recorded continuously and digitally stored.  Video monitoring was available and reviewed as appropriate.  Description: No clear posterior dominant rhythm was seen. Sleep was characterized by vertex waves, sleep spindles (12 to 14 Hz), maximal frontocentral region. EEG showed continuous generalized and lateralized right hemisphere 3 to 6 Hz theta-delta slowing in right hemisphere. Lateralized periodic discharges were noted in right hemisphere, maximal right centro-temporo-parietal region at 0.25-1Hz , at times with overlying rhythmicity. Hyperventilation and photic stimulation were not performed.    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 This eeg pattern is on the ictal-interictal continuum with increased risk of seizure recurrence.  Additionally there was cortical dysfunction arising from right hemisphere likely secondary to underlying structural abnormality, post-ictal state.  Lastly there was moderate diffuse encephalopathy.  No definite seizures  were seen throughout the  recording.   Palmira Stickle Annabelle Harman

## 2023-07-03 DIAGNOSIS — G9341 Metabolic encephalopathy: Secondary | ICD-10-CM | POA: Diagnosis not present

## 2023-07-03 DIAGNOSIS — N390 Urinary tract infection, site not specified: Secondary | ICD-10-CM | POA: Diagnosis not present

## 2023-07-03 DIAGNOSIS — R569 Unspecified convulsions: Secondary | ICD-10-CM | POA: Diagnosis not present

## 2023-07-03 DIAGNOSIS — J189 Pneumonia, unspecified organism: Secondary | ICD-10-CM

## 2023-07-03 LAB — GLUCOSE, CAPILLARY
Glucose-Capillary: 106 mg/dL — ABNORMAL HIGH (ref 70–99)
Glucose-Capillary: 156 mg/dL — ABNORMAL HIGH (ref 70–99)
Glucose-Capillary: 190 mg/dL — ABNORMAL HIGH (ref 70–99)
Glucose-Capillary: 123 mg/dL — ABNORMAL HIGH (ref 70–99)

## 2023-07-03 LAB — CBC
HCT: 39.5 % (ref 39.0–52.0)
Hemoglobin: 13 g/dL (ref 13.0–17.0)
MCH: 29.4 pg (ref 26.0–34.0)
MCHC: 32.9 g/dL (ref 30.0–36.0)
MCV: 89.4 fL (ref 80.0–100.0)
Platelets: 215 10*3/uL (ref 150–400)
RBC: 4.42 MIL/uL (ref 4.22–5.81)
RDW: 13.8 % (ref 11.5–15.5)
WBC: 16.7 10*3/uL — ABNORMAL HIGH (ref 4.0–10.5)
nRBC: 0 % (ref 0.0–0.2)

## 2023-07-03 LAB — BASIC METABOLIC PANEL
Anion gap: 10 (ref 5–15)
BUN: 22 mg/dL (ref 8–23)
CO2: 21 mmol/L — ABNORMAL LOW (ref 22–32)
Calcium: 9.1 mg/dL (ref 8.9–10.3)
Chloride: 113 mmol/L — ABNORMAL HIGH (ref 98–111)
Creatinine, Ser: 1.13 mg/dL (ref 0.61–1.24)
GFR, Estimated: >60 mL/min (ref >60)
Glucose, Bld: 114 mg/dL — ABNORMAL HIGH (ref 70–99)
Potassium: 3.9 mmol/L (ref 3.5–5.1)
Sodium: 144 mmol/L (ref 135–145)

## 2023-07-03 LAB — PROCALCITONIN: Procalcitonin: 4 ng/mL

## 2023-07-03 LAB — MAGNESIUM: Magnesium: 1.7 mg/dL (ref 1.7–2.4)

## 2023-07-03 LAB — LACTIC ACID, PLASMA: Lactic Acid, Venous: 1.1 mmol/L (ref 0.5–1.9)

## 2023-07-03 LAB — PHOSPHORUS: Phosphorus: 2 mg/dL — ABNORMAL LOW (ref 2.5–4.6)

## 2023-07-03 MED ORDER — POTASSIUM PHOSPHATES 15 MMOLE/5ML IV SOLN
15.0000 mmol | Freq: Once | INTRAVENOUS | Status: AC
  Administered 2023-07-03: 15 mmol via INTRAVENOUS
  Filled 2023-07-03: qty 5

## 2023-07-03 MED ORDER — MAGNESIUM SULFATE 2 GM/50ML IV SOLN
2.0000 g | Freq: Once | INTRAVENOUS | Status: AC
  Administered 2023-07-03: 2 g via INTRAVENOUS
  Filled 2023-07-03: qty 50

## 2023-07-03 NOTE — Progress Notes (Signed)
LTM maint complete - no skin breakdown under:  Fp1,Fp2,A2

## 2023-07-03 NOTE — Procedures (Signed)
Patient Name: Jacob Mcgrath.  MRN: [id number]  Epilepsy Attending: Charlsie Quest  Referring Physician/Provider: Erick Blinks, MD  Duration: 07/03/2023 0052 to 07/04/2023 9528   Patient history: 67yo M with left sided weakness getting eeg to evaluate for seizure    Level of alertness: Awake, asleep   AEDs during EEG study: LEV   Technical aspects: This EEG study was done with scalp electrodes positioned according to the 10-20 International system of electrode placement. Electrical activity was reviewed with band pass filter of 1-70Hz , sensitivity of 7 uV/mm, display speed of 65mm/sec with a 60Hz  notched filter applied as appropriate. EEG data were recorded continuously and digitally stored.  Video monitoring was available and reviewed as appropriate.   Description: No clear posterior dominant rhythm was seen. Sleep was characterized by vertex waves, sleep spindles (12 to 14 Hz), maximal frontocentral region. EEG showed continuous generalized and lateralized right hemisphere 3 to 6 Hz theta-delta slowing in right hemisphere. Lateralized periodic discharges were noted in right hemisphere, maximal right centro-temporo-parietal region at 0.25-1Hz . Hyperventilation and photic stimulation were not performed.     ABNORMALITY - Lateralized periodic discharges,  right hemisphere, maximal right centro-temporal region ( LPD ) - Continuous slow, generalized and lateralized right hemisphere   IMPRESSION: This study showed evidence of epileptogenicity  and cortical dysfunction arising from right hemisphere, maximal centro-temporal region likely due to underlying structural abnormality, post-ictal state. Additionally there was moderate diffuse encephalopathy. No definite seizures  were seen throughout the recording.   [name]

## 2023-07-03 NOTE — Progress Notes (Signed)
PROGRESS NOTE    Jacob Mcgrath.  KGM:[id number] DOB: July 15, 1956 DOA: 07/01/2023 PCP: Benetta Spar, MD    Brief Narrative:   Jacob Mcgrath. is a 67 y.o. 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  History obtained from unit manager at his facility.  Last known normal 7 PM day prior to admission.  At 7 AM they noticed that he was less responsive than usual and felt to be cold and clammy.  Also thought the right side of his mouth was drooping and he was not talking.  At baseline able to ambulate short distances with a cane but requires a wheelchair for longer distance.  He is typically oriented x 1 but conversant with staff at the facility.  Patient not able to provide any further history.  In the ED, temperature 98.6 F, HR 99, RR 16, BP 150/94, SpO2 84% on room air and placed on 4 L nasal cannula.  WBC 20.7, hemoglobin 13.6, platelet count 263.  Sodium 138, potassium 5.1, chloride 104, CO2 23, glucose 137, BUN 33, creat 1.23.  AST 53, ALT 25, total bilirubin 0.5.  Lactic acid 2.4.  Procalcitonin 12.62.  INR 1.2.  Urinalysis with large leukocytes, negative nitrite, rare bacteria, 21-50 WBCs.  UDS negative.  EtOH level less than 10.  Chest x-ray with heterogeneous opacities right pericardiac region.  Abdomen x-ray with nonspecific bowel gas pattern with scattered stool.  CT head without contrast with no acute intracranial process.  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  MRI brain without contrast with no acute intracranial process.  Neurology was consulted and given concern for seizure patient was transferred to [facility] for EEG.  Assessment & Plan:   Acute metabolic encephalopathy: improving Patient presenting from SNF with confusion, concern for right-sided facial droop and being less responsive.  Typically is alert and oriented x 1 and more conversant.  Does have history of CVA; no reported history of seizures but is on Keppra outpatient.  Patient was afebrile, noted to be hypoxic with SpO2 84% on room air on arrival with elevated WC count of 20K, lactic acid 2.4, elevated procalcitonin and chest x-ray with concerning findings for possible pneumonia and possible UTI on urinalysis.  TSH within normal limits.  Also concern for seizure activity and patient was transferred to [facility] for EEG.  Etiology unclear but possibly multifactorial given possible pneumonia, UTI and seizure. 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 CT head without contrast with no acute intracranial process.  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  MRI brain without contrast with no acute intracranial process. -- Neurology following, appreciate assistance -- EEG with evidence of  epileptogenicity right central temporal region, cortical dysfunction right hemisphere likely secondary to underlying structural abnormality/postictal state, moderate diffuse encephalopathy, no definite seizures -- Keppra 500 mg IV every  12 hours -- SLP recommends dysphagia 1 (pure diet), thin liquids -- PT/OT recommend return to SNF -- Seizure precautions  Community-acquired pneumonia Patient afebrile, hypoxic on admission with SpO2 84% on room air and placed on supple oxygen.  WBC count elevated 20.7 with elevated lactic acid and procalcitonin.  Chest x-ray with heterogeneous opacities right pericardiac region. -- WBC 20.7>16.7 -- Lactic acid 2.4>1.7>1.1 -- PCT 12.62>7.11>pending this am -- Blood cultures x 2: Pending -- Azithromycin 500 mg p.o. every 24 hours x 5 days -- Ceftriaxone 2 g IV every 24 hours x 5 days -- Continue to trend WBC, procalcitonin -- Aspiration precautions  Urinary tract infection Urinalysis concerning for UTI.  History of Citrobacter and E. coli UTI -- Urine culture: Pending -- Continue ceftriaxone as above  Essential hypertension -- Metoprolol tartrate 25 g p.o. twice daily  Hyperlipidemia -- Atorvastatin 80 mg p.o. daily  Type 2 diabetes mellitus Hemoglobin A1c 5.4, well-controlled. -- Very sensitive SSI for coverage  Dementia Patient currently resides at [facility], usually alert and oriented x 1. -- Palliative care following for goals of care and medical decision making   DVT prophylaxis: heparin injection 5,000 Units Start: 07/01/23 2200 TED hose Start: 07/01/23 1309 SCDs Start: 07/01/23 1309    Code Status: Full Code Family Communication: No family present at bedside this morning  Disposition Plan:  Level of care: Telemetry Medical Status is: Inpatient Remains inpatient appropriate because: Remains on EEG, awaiting further recommendations from neurology, IV antibiotics, anticipate likely discharge back to SNF in 2-3 days if continues to improve  Consultants:  Neurology Palliative care  Procedures:  EEG  Antimicrobials:  Azithromycin 12/7>> Ceftriaxone 12/7>>   Subjective: Patient seen examined bedside, lying in bed.  Continues on EEG.  Alert to  self.  Denies pain, no shortness of breath, no fever, no chest pain.  Unable to obtain any further ROS given his underlying dementia.  No acute events overnight per nursing staff.  Objective: Vitals:   07/03/23 0020 07/03/23 0418 07/03/23 0500 07/03/23 0747  BP: (!) 141/82 138/83  134/80  Pulse: (!) 106 (!) 110  (!) 117  Resp: 20 (!) 22  17  Temp: 98.3 F (36.8 C) 98.7 F (37.1 C)  99.3 F (37.4 C)  TempSrc: Oral Oral  Oral  SpO2: 98% 98%  98%  Weight:   57.5 kg   Height:        Intake/Output Summary (Last 24 hours) at 07/03/2023 1007 Last data filed at 07/03/2023 7564 Gross per 24 hour  Intake 337 ml  Output 700 ml  Net -363 ml   Filed Weights   07/01/23 1352 07/02/23 0642 07/03/23 0500  Weight: 57.6 kg 62.5 kg 57.5 kg    Examination:  Physical Exam: GEN: NAD, alert and oriented to self, chronically ill/elderly in appearance, appears older than stated age HEENT: NCAT, PERRL, EOMI, sclera clear, MMM PULM: Breath sounds slight diminished bilateral bases, no wheezing/crackles, normal respiratory effort that accessory muscle use, on 2 L nasal cannula with SpO2 98% the rest CV: RRR w/o M/G/R GI: abd soft, NTND, NABS, no R/G/M MSK: no peripheral edema, moves all extremities independently NEURO: CN II-XII intact, + weakness left UE/LE greater than right PSYCH: Depressed mood, flat affect Integumentary: No concerning rashes/lesions/wounds noted on exposed skin surfaces    Data Reviewed: I  have personally reviewed following labs and imaging studies  CBC: Recent Labs  Lab 07/01/23 1236 07/03/23 0641  WBC 20.7* 16.7*  NEUTROABS 16.3*  --   HGB 13.6 13.0  HCT 44.1 39.5  MCV 94.8 89.4  PLT 263 215   Basic Metabolic Panel: Recent Labs  Lab 07/01/23 1236 07/03/23 0641  NA 138 144  K 5.1 3.9  CL 104 113*  CO2 23 21*  GLUCOSE 137* 114*  BUN 23 22  CREATININE 1.23 1.13  CALCIUM 8.7* 9.1  MG 1.9 1.7  PHOS 5.2* 2.0*   GFR: Estimated Creatinine Clearance: 51.6  mL/min (by C-G formula based on SCr of 1.13 mg/dL). Liver Function Tests: Recent Labs  Lab 07/01/23 1236  AST 53*  ALT 25  ALKPHOS 79  BILITOT 0.5  PROT 7.1  ALBUMIN 3.5   No results for input(s): "LIPASE", "AMYLASE" in the last 168 hours. No results for input(s): "AMMONIA" in the last 168 hours. Coagulation Profile: Recent Labs  Lab 07/01/23 1236  INR 1.2   Cardiac Enzymes: No results for input(s): "CKTOTAL", "CKMB", "CKMBINDEX", "TROPONINI" in the last 168 hours. BNP (last 3 results) No results for input(s): "PROBNP" in the last 8760 hours. HbA1C: Recent Labs    07/01/23 1236  HGBA1C 5.4   CBG: Recent Labs  Lab 07/02/23 0621 07/02/23 1223 07/02/23 1902 07/02/23 2022 07/03/23 0621  GLUCAP 135* 146* 130* 134* 106*   Lipid Profile: No results for input(s): "CHOL", "HDL", "LDLCALC", "TRIG", "CHOLHDL", "LDLDIRECT" in the last 72 hours. Thyroid Function Tests: Recent Labs    07/01/23 1236  TSH 2.060   Anemia Panel: No results for input(s): "VITAMINB12", "FOLATE", "FERRITIN", "TIBC", "IRON", "RETICCTPCT" in the last 72 hours. Sepsis Labs: Recent Labs  Lab 07/01/23 1236 07/01/23 1405 07/02/23 0759 07/03/23 0641  PROCALCITON 12.62  --  7.11  --   LATICACIDVEN  --  2.4* 1.7 1.1    No results found for this or any previous visit (from the past 240 hour(s)).       Radiology Studies: Overnight EEG with video  Result Date: 07/02/2023 Charlsie Quest, MD     07/03/2023  8:45 AM Patient Name: Aybel Tuma. MRN: [id number] Epilepsy Attending: Charlsie Quest Referring Physician/Provider: Erick Blinks, MD Duration: 07/02/2023 2952 to 07/03/2023 8413 Patient history: 67yo M with left sided weakness getting eeg to evaluate for seizure Level of alertness: Awake, asleep AEDs during EEG study: LEV Technical aspects: This EEG study was done with scalp electrodes positioned according to the 10-20 International system of electrode placement. Electrical activity  was reviewed with band pass filter of 1-70Hz , sensitivity of 7 uV/mm, display speed of 75mm/sec with a 60Hz  notched filter applied as appropriate. EEG data were recorded continuously and digitally stored.  Video monitoring was available and reviewed as appropriate. Description: No clear posterior dominant rhythm was seen. Sleep was characterized by vertex waves, sleep spindles (12 to 14 Hz), maximal frontocentral region. EEG showed continuous generalized and lateralized right hemisphere 3 to 6 Hz theta-delta slowing in right hemisphere. Lateralized periodic discharges were noted in right hemisphere, maximal right centro-temporo-parietal region at 0.25-1Hz , at times with overlying rhythmicity. Hyperventilation and photic stimulation were not performed.  ABNORMALITY - Lateralized periodic discharges with overlying rhythmicity,  right hemisphere, maximal right centro-temporal region ( LPD +R) - Continuous slow, generalized and lateralized right hemisphere  IMPRESSION: This study showed evidence of epileptogenicity arising from right centro-temporal region. This eeg pattern is on the ictal-interictal continuum with increased risk of seizure  recurrence. Additionally there was cortical dysfunction arising from right hemisphere likely secondary to underlying structural abnormality, post-ictal state. Lastly there was moderate diffuse encephalopathy. No definite seizures  were seen throughout the recording.  Charlsie Quest   EEG adult  Result Date: 07/01/2023 Charlsie Quest, MD     07/01/2023  5:21 PM Patient Name: Shavar Jaworowski. MRN: [id number] Epilepsy Attending: Charlsie Quest Referring Physician/Provider: Kendell Bane, MD Date: 07/01/2023 Duration: 24.21 mins Patient history: 67yo M with  left sided weakness getting eeg to evaluate for seizure Level of alertness: Awake AEDs during EEG study: None Technical aspects: This EEG study was done with scalp electrodes positioned according to the 10-20  International system of electrode placement. Electrical activity was reviewed with band pass filter of 1-70Hz , sensitivity of 7 uV/mm, display speed of 54mm/sec with a 60Hz  notched filter applied as appropriate. EEG data were recorded continuously and digitally stored.  Video monitoring was available and reviewed as appropriate. Description: The posterior dominant rhythm consists of 8 Hz activity of moderate voltage (25-35 uV) seen predominantly in posterior head regions, asymmetric ( right<left) and reactive to eye opening and eye closing. EEG showed continuous 3 to 6 Hz theta-delta slowing in right hemisphere. Sharp waves were noted in right centro-temporal region. Intermittent generalized 3-5hz  theta-delta slowing was also noted. Hyperventilation and photic stimulation were not performed.   Of note, parts of study were difficult to interpret due to significant myogenic artifact. ABNORMALITY - Sharp wave, right centro-temporal region - Continuous slow, right hemisphere - Intermittent slow, generalized - Background asymmetry, right<left IMPRESSION: This technically difficult study showed evidence of epileptogenicity arising from right centro-temporal region. Additionally there is cortical dysfunction arising from right hemisphere likely secondary to underlying structural abnormality, post-ictal state. Lastly there was  mild diffuse encephalopathy. No definite seizures  were seen throughout the recording. Charlsie Quest   MR BRAIN WO CONTRAST  Result Date: 07/01/2023 CLINICAL DATA:  Altered mental status, neglect, stroke suspected EXAM: MRI HEAD WITHOUT CONTRAST TECHNIQUE: Multiplanar, multiecho pulse sequences of the brain and surrounding structures were obtained without intravenous contrast. COMPARISON:  01/03/2023 MRI head, correlation is also made with 07/01/2023 CT head FINDINGS: Brain: No restricted diffusion to suggest acute or subacute infarct. No acute hemorrhage, mass, mass effect, or midline shift.  No hydrocephalus or extra-axial collection. Craniocervical junction within normal limits. Hemosiderin deposition in the right frontal and parietal lobes, associated with an area of encephalomalacia, likely petechial hemorrhage from a remote MCA territory infarct. Advanced cerebral volume loss for age, with ex vacuo dilatation of the ventricles. Ventricles and sulci are commensurate in size Vascular: Loss of the proximal left V4 flow void (series 10, image 5), which is new from the prior MRI but may correlate with an area of stenosis on the same-day CTA. Otherwise normal flow voids. Skull and upper cervical spine: Normal marrow signal. Sinuses/Orbits: Mucous retention cyst in the right maxillary sinus and posterior right ethmoid air cells. No acute finding in the orbits. Other: The mastoid air cells are well aerated. IMPRESSION: No acute intracranial process. No evidence of acute or subacute infarct. Electronically Signed   By: Wiliam Ke M.D.   On: 07/01/2023 16:13   ECHOCARDIOGRAM COMPLETE  Result Date: 07/01/2023    ECHOCARDIOGRAM REPORT   Patient Name:   Gianpiero Bras. Date of Exam: 07/01/2023 Medical Rec #:  [id number]          Height:       69.0 in Accession #:  [id number]         Weight:       127.0 lb Date of Birth:  1956-03-31         BSA:          1.703 m Patient Age:    42 years           BP:           165/90 mmHg Patient Gender: M                  HR:           96 bpm. [facility] Procedure: 2D Echo, Color Doppler, Cardiac Doppler and Intracardiac            Opacification Agent Indications:    Stroke I63.9  History:        Patient has no prior history of Echocardiogram examinations.                 Stroke; Risk Factors:Hypertension and Diabetes.  Sonographer:    Celesta Gentile RCS Referring Phys: [phone] SEYED A SHAHMEHDI IMPRESSIONS  1. Left ventricular ejection fraction, by estimation, is 55 to 60%. The left ventricle has normal function. Left ventricular endocardial border not  optimally defined to evaluate regional wall motion - basal septum and basal posterior wall dyssynchronous  but contractile. Left ventricular diastolic parameters are consistent with Grade I diastolic dysfunction (impaired relaxation).  2. Definity contrast shows no LV mural thrombus.  3. Right ventricular systolic function is low normal. The right ventricular size is normal. There is normal pulmonary artery systolic pressure. The estimated right ventricular systolic pressure is 29.6 mmHg.  4. The mitral valve is degenerative. Trivial mitral valve regurgitation.  5. The aortic valve has an indeterminant number of cusps. There is moderate calcification of the aortic valve. Aortic valve regurgitation is not visualized. Aortic valve sclerosis/calcification is present, without any evidence of aortic stenosis.  6. The inferior vena cava is normal in size with greater than 50% respiratory variability, suggesting right atrial pressure of 3 mmHg. Comparison(s): No prior Echocardiogram. FINDINGS  Left Ventricle: Left ventricular ejection fraction, by estimation, is 55 to 60%. The left ventricle has normal function. Left ventricular endocardial border not optimally defined to evaluate regional wall motion. Definity contrast agent was given IV to delineate the left ventricular endocardial borders. The left ventricular internal cavity size was normal in size. There is no left ventricular hypertrophy. Left ventricular diastolic parameters are consistent with Grade I diastolic dysfunction (impaired relaxation). Right Ventricle: The right ventricular size is normal. No increase in right ventricular wall thickness. Right ventricular systolic function is low normal. There is normal pulmonary artery systolic pressure. The tricuspid regurgitant velocity is 2.58 m/s,  and with an assumed right atrial pressure of 3 mmHg, the estimated right ventricular systolic pressure is 29.6 mmHg. Left Atrium: Left atrial size was normal in size.  Right Atrium: Right atrial size was normal in size. Pericardium: There is no evidence of pericardial effusion. Mitral Valve: The mitral valve is degenerative in appearance. There is mild calcification of the mitral valve leaflet(s). Trivial mitral valve regurgitation. Tricuspid Valve: The tricuspid valve is grossly normal. Tricuspid valve regurgitation is mild. Aortic Valve: The aortic valve has an indeterminant number of cusps. There is moderate calcification of the aortic valve. There is mild to moderate aortic valve annular calcification. Aortic valve regurgitation is not visualized. Aortic valve sclerosis/calcification is present, without any evidence of aortic stenosis. Pulmonic Valve:  The pulmonic valve was not well visualized. Pulmonic valve regurgitation is trivial. Aorta: The aortic root is normal in size and structure. Venous: The inferior vena cava is normal in size with greater than 50% respiratory variability, suggesting right atrial pressure of 3 mmHg. IAS/Shunts: No atrial level shunt detected by color flow Doppler.  LEFT VENTRICLE PLAX 2D LVIDd:         4.10 cm   Diastology LVIDs:         3.70 cm   LV e' medial:    5.44 cm/s LV PW:         0.90 cm   LV E/e' medial:  11.0 LV IVS:        0.80 cm   LV e' lateral:   8.49 cm/s LVOT diam:     1.80 cm   LV E/e' lateral: 7.0 LV SV:         39 LV SV Index:   23 LVOT Area:     2.54 cm  RIGHT VENTRICLE RV S prime:     11.20 cm/s TAPSE (M-mode): 2.0 cm LEFT ATRIUM             Index        RIGHT ATRIUM           Index LA diam:        2.90 cm 1.70 cm/m   RA Area:     17.00 cm LA Vol (A2C):   42.4 ml 24.89 ml/m  RA Volume:   52.50 ml  30.82 ml/m LA Vol (A4C):   44.9 ml 26.36 ml/m LA Biplane Vol: 46.2 ml 27.12 ml/m  AORTIC VALVE LVOT Vmax:   95.30 cm/s LVOT Vmean:  62.600 cm/s LVOT VTI:    0.152 m  AORTA Ao Root diam: 2.70 cm MITRAL VALVE                TRICUSPID VALVE MV Area (PHT): 4.31 cm     TR Peak grad:   26.6 mmHg MV Decel Time: 176 msec     TR Vmax:         258.00 cm/s MV E velocity: 59.60 cm/s MV A velocity: 107.00 cm/s  SHUNTS MV E/A ratio:  0.56         Systemic VTI:  0.15 m                             Systemic Diam: 1.80 cm Nona Dell MD Electronically signed by Nona Dell MD Signature Date/Time: 07/01/2023/3:14:04 PM    Final    DG Abd 1 View  Result Date: 07/01/2023 CLINICAL DATA:  MRI clearance EXAM: ABDOMEN - 1 VIEW COMPARISON:  X-ray 02/28/2023 FINDINGS: Scattered colonic stool. Gas seen in nondilated loops of small and large bowel. No obstruction. Degenerative changes seen of the spine and pelvis particularly of the right hip. Left hip arthroplasty seen at the edge of the imaging field, incompletely included. There is contrast in the renal collecting systems including the bladder which appears to has a trabeculated wall with some potential bladder diverticula. There is a vascular stent overlying the left iliac region. Please correlate for time course and type of stent. Overlapping cardiac leads. IMPRESSION: Nonspecific bowel gas pattern with scattered stool. Left hip prosthesis.  Degenerative changes. Left iliac vascular stents. Please correlate with the type and time of placement of stent. Please correlate for compatibility for MRI Electronically Signed   By: Piedad Climes.D.  On: 07/01/2023 14:47   DG Chest Portable 1 View  Result Date: 07/01/2023 CLINICAL DATA:  Hypoxia. EXAM: PORTABLE CHEST 1 VIEW COMPARISON:  02/27/2023. FINDINGS: Redemonstration of increased interstitial markings throughout bilateral lungs, grossly similar to the prior study. No frank pulmonary edema. There are heterogeneous opacities in the right paracardiac region. Differential diagnosis includes combination of vascular/interstitial markings versus superimposed atelectasis/pneumonitis. Correlate clinically. Bilateral lung fields are otherwise clear. Bilateral costophrenic angles are clear. Normal cardio-mediastinal silhouette. No acute osseous abnormalities.  The soft tissues are within normal limits. IMPRESSION: *Heterogeneous opacities in the right paracardiac region, as discussed above. Electronically Signed   By: Jules Schick M.D.   On: 07/01/2023 13:56   CT HEAD CODE STROKE WO CONTRAST  Result Date: 07/01/2023 CLINICAL DATA:  Right gaze deviation, altered mental status, stroke suspected EXAM: CT ANGIOGRAPHY HEAD AND NECK CT PERFUSION BRAIN TECHNIQUE: Multidetector CT imaging of the head and neck was performed using the standard protocol during bolus administration of intravenous contrast. Multiplanar CT image reconstructions and MIPs were obtained to evaluate the vascular anatomy. Carotid stenosis measurements (when applicable) are obtained utilizing NASCET criteria, using the distal internal carotid diameter as the denominator. Multiphase CT imaging of the brain was performed following IV bolus contrast injection. Subsequent parametric perfusion maps were calculated using RAPID software. RADIATION DOSE REDUCTION: This exam was performed according to the departmental dose-optimization program which includes automated exposure control, adjustment of the mA and/or kV according to patient size and/or use of iterative reconstruction technique. CONTRAST:  OMNIPAQUE IOHEXOL 350 MG/ML SOLN COMPARISON:  02/27/2023 CT head FINDINGS: CT HEAD FINDINGS Brain: No evidence of acute infarction, hemorrhage, mass, mass effect, or midline shift. No hydrocephalus or extra-axial collection. 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 Partial empty sella. Normal craniocervical junction. Periventricular white matter changes, likely the sequela of chronic small vessel ischemic disease. Vascular: No hyperdense vessel. Skull: Negative for fracture or focal lesion. Sinuses/Orbits: No acute finding. Other: The mastoid air cells are well aerated. ASPECTS [facility] Stroke Program Early CT Score) - Ganglionic  level infarction (caudate, lentiform nuclei, internal capsule, insula, M1-M3 cortex): 7 - Supraganglionic infarction (M4-M6 cortex): 3 Total score (0-10 with 10 being normal): 10 CTA NECK FINDINGS Aortic arch: Standard branching. Imaged portion shows no evidence of aneurysm or dissection. 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 Aortic atherosclerosis. Multifocal stenosis in the right subclavian artery is not hemodynamically significant (series 6, image 280). Right carotid system: No evidence of dissection, occlusion, or hemodynamically significant stenosis (greater than 50%). Atherosclerotic disease in the common carotid, at the bifurcation, and in the proximal ICA is not hemodynamically significant. Left carotid system: No evidence of dissection, occlusion, or hemodynamically significant stenosis (greater than 50%). Atherosclerotic disease in the common carotid, at the bifurcation, and in the proximal ICA is not hemodynamically significant. Vertebral arteries: The left vertebral artery is poorly opacified at its origin and in the left V1 and proximal V2 segments, with improved opacification distally, likely reflecting retrograde flow. The right vertebral artery is patent from its origin to the skull base, without significant stenosis or evidence of dissection. Skeleton: No acute osseous abnormality. Degenerative changes in the cervical spine. Other neck: No acute finding. Upper chest: No focal pulmonary opacity or pleural  effusion. Emphysema. Review of the MIP images confirms the above findings CTA HEAD FINDINGS Anterior circulation: Both internal carotid arteries are patent to the termini, with severe stenosis in the distal left cavernous ICA (series 6, image 119), and moderate stenosis in the right  cavernous and bilateral supraclinoid ICA. A1 segments patent, hypoplastic on the left. Normal anterior communicating artery. Azygous A2. Anterior cerebral arteries are patent to their distal aspects without significant stenosis. No M1 stenosis or occlusion. MCA branches perfused to their distal aspects without significant stenosis. Posterior circulation: Vertebral arteries patent to the vertebrobasilar junction with moderate stenosis in the proximal left V4 (series 6, image 147) and severe stenosis near the vertebrobasilar junction (series 6, image 130 posterior inferior cerebellar arteries patent proximally. Basilar patent to its distal aspect without significant stenosis. Superior cerebellar arteries patent proximally. Patent P1 segments. PCAs perfused to their distal aspects without significant stenosis. The bilateral posterior communicating arteries are patent. Venous sinuses: As permitted by contrast timing, patent. Anatomic variants: None significant. No evidence of aneurysm or vascular malformation. Review of the MIP images confirms the above findings CT Brain Perfusion Findings: ASPECTS: 10 CBF (<30%) Volume: 0mL Perfusion (Tmax>6.0s) volume: 0mL Mismatch Volume: 0mL Infarction Location:None IMPRESSION: 1. No acute intracranial process. ASPECTS is 10. 2. No intracranial large vessel occlusion. 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 Moderate stenosis right cavernous and bilateral supraclinoid ICA and more proximal left V4 3. 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 The left vertebral artery is poorly opacified at its origin and in the left V1 and proximal V2 segments, with improved opacification distally, likely reflecting retrograde flow to the left subclavian this territory. 4. No hemodynamically significant stenosis in the neck. 5. No evidence of infarct core or penumbra on CT perfusion. 6.  Aortic Atherosclerosis (ICD10-I70.0) and Emphysema (ICD10-J43.9). Code stroke imaging results were communicated on 07/01/2023 at 12:19 pm to provider Eloise Harman via telephone, who verbally acknowledged these results. Electronically Signed   By: Wiliam Ke M.D.   On: 07/01/2023 12:33   CT ANGIO HEAD NECK W WO CM W PERF (CODE STROKE)  Result Date: 07/01/2023 CLINICAL DATA:  Right gaze deviation, altered mental status, stroke suspected EXAM: CT ANGIOGRAPHY HEAD AND NECK CT PERFUSION BRAIN TECHNIQUE: Multidetector CT imaging of the head and neck was performed using the standard protocol during bolus administration of intravenous contrast. Multiplanar CT image reconstructions and MIPs were obtained to evaluate the vascular anatomy. Carotid stenosis measurements (when applicable) are obtained utilizing NASCET criteria, using the distal internal carotid diameter as the denominator. Multiphase CT imaging of the brain was performed following IV bolus contrast injection. Subsequent parametric perfusion maps were calculated using RAPID software. RADIATION DOSE REDUCTION: This exam was performed according to the departmental dose-optimization program which includes automated exposure control, adjustment of the mA and/or kV according to patient size and/or use of iterative reconstruction technique. CONTRAST:  OMNIPAQUE IOHEXOL 350 MG/ML SOLN COMPARISON:  02/27/2023 CT head FINDINGS: CT HEAD FINDINGS Brain: No evidence of acute infarction, hemorrhage, mass, mass effect, or midline shift. No hydrocephalus or extra-axial collection. 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 Partial empty sella. Normal craniocervical junction. Periventricular white matter changes, likely the sequela of chronic small vessel ischemic disease. Vascular: No hyperdense vessel. Skull: Negative for fracture or focal lesion. Sinuses/Orbits: No acute finding. Other: The  mastoid air cells  are well aerated. ASPECTS [facility] Stroke Program Early CT Score) - Ganglionic level infarction (caudate, lentiform nuclei, internal capsule, insula, M1-M3 cortex): 7 - Supraganglionic infarction (M4-M6 cortex): 3 Total score (0-10 with 10 being normal): 10 CTA NECK FINDINGS Aortic arch: Standard branching. Imaged portion shows no evidence of aneurysm or dissection. 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 Aortic atherosclerosis. Multifocal stenosis in the right subclavian artery is not hemodynamically significant (series 6, image 280). Right carotid system: No evidence of dissection, occlusion, or hemodynamically significant stenosis (greater than 50%). Atherosclerotic disease in the common carotid, at the bifurcation, and in the proximal ICA is not hemodynamically significant. Left carotid system: No evidence of dissection, occlusion, or hemodynamically significant stenosis (greater than 50%). Atherosclerotic disease in the common carotid, at the bifurcation, and in the proximal ICA is not hemodynamically significant. Vertebral arteries: The left vertebral artery is poorly opacified at its origin and in the left V1 and proximal V2 segments, with improved opacification distally, likely reflecting retrograde flow. The right vertebral artery is patent from its origin to the skull base, without significant stenosis or evidence of dissection. Skeleton: No acute osseous abnormality. Degenerative changes in the cervical spine. Other neck: No acute finding. Upper chest: No focal pulmonary opacity or pleural effusion. Emphysema. Review of the MIP images confirms the above findings CTA HEAD FINDINGS Anterior circulation: Both internal carotid arteries are patent to the termini, with severe  stenosis in the distal left cavernous ICA (series 6, image 119), and moderate stenosis in the right cavernous and bilateral supraclinoid ICA. A1 segments patent, hypoplastic on the left. Normal anterior communicating artery. Azygous A2. Anterior cerebral arteries are patent to their distal aspects without significant stenosis. No M1 stenosis or occlusion. MCA branches perfused to their distal aspects without significant stenosis. Posterior circulation: Vertebral arteries patent to the vertebrobasilar junction with moderate stenosis in the proximal left V4 (series 6, image 147) and severe stenosis near the vertebrobasilar junction (series 6, image 130 posterior inferior cerebellar arteries patent proximally. Basilar patent to its distal aspect without significant stenosis. Superior cerebellar arteries patent proximally. Patent P1 segments. PCAs perfused to their distal aspects without significant stenosis. The bilateral posterior communicating arteries are patent. Venous sinuses: As permitted by contrast timing, patent. Anatomic variants: None significant. No evidence of aneurysm or vascular malformation. Review of the MIP images confirms the above findings CT Brain Perfusion Findings: ASPECTS: 10 CBF (<30%) Volume: 0mL Perfusion (Tmax>6.0s) volume: 0mL Mismatch Volume: 0mL Infarction Location:None IMPRESSION: 1. No acute intracranial process. ASPECTS is 10. 2. No intracranial large vessel occlusion. 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 Moderate stenosis right cavernous and bilateral supraclinoid ICA and more proximal left V4 3. 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 The left vertebral artery is poorly opacified at its origin and in the left V1 and proximal V2 segments, with improved opacification distally, likely reflecting retrograde flow to the left subclavian this territory. 4. No  hemodynamically significant stenosis in the neck. 5. No evidence of infarct core or penumbra on CT perfusion. 6. Aortic Atherosclerosis (ICD10-I70.0) and Emphysema (ICD10-J43.9). Code stroke imaging results were communicated on 07/01/2023 at 12:19 pm to provider Eloise Harman via telephone, who verbally acknowledged these results.  Electronically Signed   By: Wiliam Ke M.D.   On: 07/01/2023 12:33        Scheduled Meds:  atorvastatin  80 mg Oral QHS   azithromycin  500 mg Oral Daily   feeding supplement  237 mL Oral BID BM   heparin  5,000 Units Subcutaneous Q8H   insulin aspart  0-6 Units Subcutaneous TID WC   metoprolol tartrate  25 mg Oral BID   nicotine  14 mg Transdermal Daily   pantoprazole (PROTONIX) IV  40 mg Intravenous Q24H   sodium chloride flush  3 mL Intravenous Q12H   sodium chloride flush  3 mL Intravenous Q12H   Continuous Infusions:  sodium chloride     cefTRIAXone (ROCEPHIN)  IV 2 g (07/02/23 1214)   levETIRAcetam 500 mg (07/03/23 0023)   potassium PHOSPHATE IVPB (in mmol) 15 mmol (07/03/23 0931)     LOS: 2 days    Time spent: 52 minutes spent on chart review, discussion with nursing staff, consultants, updating family and interview/physical exam; more than 50% of that time was spent in counseling and/or coordination of care.    Alvira Philips [place], DO [facility] Available via Epic secure chat 7am-7pm After these hours, please refer to coverage provider listed on [email or web address] 07/03/2023, 10:07 AM

## 2023-07-04 DIAGNOSIS — J189 Pneumonia, unspecified organism: Secondary | ICD-10-CM | POA: Diagnosis not present

## 2023-07-04 DIAGNOSIS — N3000 Acute cystitis without hematuria: Secondary | ICD-10-CM

## 2023-07-04 DIAGNOSIS — Z7189 Other specified counseling: Secondary | ICD-10-CM

## 2023-07-04 DIAGNOSIS — Z515 Encounter for palliative care: Secondary | ICD-10-CM | POA: Diagnosis not present

## 2023-07-04 DIAGNOSIS — R569 Unspecified convulsions: Secondary | ICD-10-CM | POA: Diagnosis not present

## 2023-07-04 DIAGNOSIS — R627 Adult failure to thrive: Secondary | ICD-10-CM | POA: Diagnosis not present

## 2023-07-04 LAB — URINE CULTURE: Culture: 20000 — AB

## 2023-07-04 LAB — PROCALCITONIN: Procalcitonin: 2.43 ng/mL

## 2023-07-04 LAB — GLUCOSE, CAPILLARY
Glucose-Capillary: 176 mg/dL — ABNORMAL HIGH (ref 70–99)
Glucose-Capillary: 203 mg/dL — ABNORMAL HIGH (ref 70–99)
Glucose-Capillary: 142 mg/dL — ABNORMAL HIGH (ref 70–99)
Glucose-Capillary: 265 mg/dL — ABNORMAL HIGH (ref 70–99)

## 2023-07-04 LAB — BASIC METABOLIC PANEL
Anion gap: 11 (ref 5–15)
BUN: 28 mg/dL — ABNORMAL HIGH (ref 8–23)
CO2: 22 mmol/L (ref 22–32)
Calcium: 9 mg/dL (ref 8.9–10.3)
Chloride: 115 mmol/L — ABNORMAL HIGH (ref 98–111)
Creatinine, Ser: 1.17 mg/dL (ref 0.61–1.24)
GFR, Estimated: >60 mL/min (ref >60)
Glucose, Bld: 145 mg/dL — ABNORMAL HIGH (ref 70–99)
Potassium: 3.7 mmol/L (ref 3.5–5.1)
Sodium: 148 mmol/L — ABNORMAL HIGH (ref 135–145)

## 2023-07-04 LAB — CBC
HCT: 38.7 % — ABNORMAL LOW (ref 39.0–52.0)
Hemoglobin: 12.5 g/dL — ABNORMAL LOW (ref 13.0–17.0)
MCH: 29.4 pg (ref 26.0–34.0)
MCHC: 32.3 g/dL (ref 30.0–36.0)
MCV: 91.1 fL (ref 80.0–100.0)
Platelets: 232 10*3/uL (ref 150–400)
RBC: 4.25 MIL/uL (ref 4.22–5.81)
RDW: 13.8 % (ref 11.5–15.5)
WBC: 15.1 10*3/uL — ABNORMAL HIGH (ref 4.0–10.5)
nRBC: 0 % (ref 0.0–0.2)

## 2023-07-04 MED ORDER — LEVETIRACETAM 500 MG PO TABS
500.0000 mg | ORAL_TABLET | Freq: Two times a day (BID) | ORAL | Status: DC
  Administered 2023-07-04 – 2023-07-05 (×3): 500 mg via ORAL
  Filled 2023-07-04 (×3): qty 1

## 2023-07-04 MED ORDER — ORAL CARE MOUTH RINSE
15.0000 mL | OROMUCOSAL | Status: DC
  Administered 2023-07-04 – 2023-07-05 (×2): 15 mL via OROMUCOSAL

## 2023-07-04 MED ORDER — PANTOPRAZOLE SODIUM 40 MG PO TBEC
40.0000 mg | DELAYED_RELEASE_TABLET | Freq: Every day | ORAL | Status: DC
  Administered 2023-07-04 – 2023-07-05 (×2): 40 mg via ORAL
  Filled 2023-07-04 (×2): qty 1

## 2023-07-04 MED ORDER — ORAL CARE MOUTH RINSE: 15.0000 mL | OROMUCOSAL | Status: DC | PRN

## 2023-07-04 NOTE — Progress Notes (Signed)
LTM EEG disconnected - no skin breakdown at unhook.  

## 2023-07-04 NOTE — Care Management Important Message (Signed)
Important Message  Patient Details  Name: Jacob Mcgrath. MRN: [id number] Date of Birth: Nov 11, 1955   Important Message Given:  Yes - Medicare IM     Dorena Bodo 07/04/2023, 4:10 PM

## 2023-07-04 NOTE — Plan of Care (Signed)
  Problem: Clinical Measurements: Goal: Ability to maintain clinical measurements within normal limits will improve Outcome: Progressing   Problem: Nutrition: Goal: Adequate nutrition will be maintained Outcome: Progressing   Problem: Elimination: Goal: Will not experience complications related to bowel motility Outcome: Progressing   Problem: Safety: Goal: Ability to remain free from injury will improve Outcome: Progressing   Problem: Skin Integrity: Goal: Risk for impaired skin integrity will decrease Outcome: Progressing

## 2023-07-04 NOTE — NC FL2 (Signed)
[place] MEDICAID FL2 LEVEL OF CARE FORM     IDENTIFICATION  Patient Name: Jacob Mcgrath. Birthdate: 15-Oct-1955 Sex: male Admission Date (Current Location): 07/01/2023  [facility] and [place] Number:  [employer] and Address:  [facility], [address], [place], [place] [address]      Provider Number: [id number]  Attending Physician Name and Address:  [place], Alvira Philips, DO  Relative Name and Phone Number:       Current Level of Care: Hospital Recommended Level of Care: Skilled Nursing Facility Prior Approval Number:    Date Approved/Denied:   PASRR Number:    Discharge Plan: SNF    Current Diagnoses: Patient Active Problem List   Diagnosis Date Noted   Seizure (HCC) 07/02/2023   Stroke (HCC) 07/01/2023   Leukocytosis 07/01/2023   Tremor 02/27/2023   GERD (gastroesophageal reflux disease) 02/27/2023   Depression 02/27/2023   Protein-calorie malnutrition, severe 10/07/2022   Ischemic leg 10/01/2022   Hypertension 10/01/2022   Diabetes mellitus without complication (HCC) 10/01/2022    Orientation RESPIRATION BLADDER Height & Weight     Self  Normal Incontinent Weight: 126 lb 8.7 oz (57.4 kg) Height:  5\' 9"  (175.3 cm)  BEHAVIORAL SYMPTOMS/MOOD NEUROLOGICAL BOWEL NUTRITION STATUS      Incontinent Diet (see DC summary)  AMBULATORY STATUS COMMUNICATION OF NEEDS Skin   Extensive Assist Verbally Normal                       Personal Care Assistance Level of Assistance  Bathing, Feeding, Dressing Bathing Assistance: Maximum assistance Feeding assistance: Maximum assistance Dressing Assistance: Maximum assistance     Functional Limitations Info  Sight Sight Info: Impaired Hearing Info: Adequate Speech Info: Adequate    SPECIAL CARE FACTORS FREQUENCY                       Contractures Contractures Info: Not present    Additional Factors Info  Code Status, Allergies Code Status Info: Full Allergies  Info: Amoxicillin, Ampicillin, Tetracyclines & Related           Current Medications (07/04/2023):  This is the current hospital active medication list Current Facility-Administered Medications  Medication Dose Route Frequency Provider Last Rate Last Admin   0.9 %  sodium chloride infusion  250 mL Intravenous PRN Shahmehdi, Seyed A, MD       acetaminophen (TYLENOL) tablet 650 mg  650 mg Oral Q6H PRN Shahmehdi, Seyed A, MD   650 mg at 07/03/23 1317   Or   acetaminophen (TYLENOL) suppository 650 mg  650 mg Rectal Q6H PRN Shahmehdi, Seyed A, MD       atorvastatin (LIPITOR) tablet 80 mg  80 mg Oral QHS Shahmehdi, Seyed A, MD   80 mg at 07/03/23 2058   azithromycin (ZITHROMAX) tablet 500 mg  500 mg Oral Daily [place], Alvira Philips, DO   500 mg at 07/04/23 4332   bisacodyl (DULCOLAX) EC tablet 5 mg  5 mg Oral Daily PRN Shahmehdi, Seyed A, MD       cefTRIAXone (ROCEPHIN) 2 g in sodium chloride 0.9 % 100 mL IVPB  2 g Intravenous Q24H [place], Alvira Philips, DO 200 mL/hr at 07/03/23 1136 2 g at 07/03/23 1136   feeding supplement (ENSURE ENLIVE / ENSURE PLUS) liquid 237 mL  237 mL Oral BID BM [place], Eric J, DO   237 mL at 07/04/23 0814   haloperidol lactate (HALDOL) injection 2  mg  2 mg Intravenous Q6H PRN Shahmehdi, Seyed A, MD       heparin injection 5,000 Units  5,000 Units Subcutaneous Q8H Shahmehdi, Seyed A, MD   5,000 Units at 07/04/23 0604   insulin aspart (novoLOG) injection 0-6 Units  0-6 Units Subcutaneous TID WC Shahmehdi, Seyed A, MD   1 Units at 07/04/23 0813   ipratropium (ATROVENT) nebulizer solution 0.5 mg  0.5 mg Nebulization Q6H PRN Kendell Bane, MD       levETIRAcetam (KEPPRA) tablet 500 mg  500 mg Oral BID [place], Alvira Philips, DO   500 mg at 07/04/23 2595   LORazepam (ATIVAN) injection 1 mg  1 mg Intravenous Q6H PRN Shahmehdi, Seyed A, MD       metoprolol tartrate (LOPRESSOR) tablet 25 mg  25 mg Oral BID [place], Alvira Philips, DO   25 mg at 07/04/23 6387   nicotine (NICODERM CQ - dosed in mg/24  hours) patch 14 mg  14 mg Transdermal Daily Shahmehdi, Seyed A, MD   14 mg at 07/04/23 0812   ondansetron (ZOFRAN) tablet 4 mg  4 mg Oral Q6H PRN Shahmehdi, Seyed A, MD       Or   ondansetron (ZOFRAN) injection 4 mg  4 mg Intravenous Q6H PRN Shahmehdi, Seyed A, MD       oxyCODONE (Oxy IR/ROXICODONE) immediate release tablet 5 mg  5 mg Oral Q4H PRN Shahmehdi, Seyed A, MD       pantoprazole (PROTONIX) EC tablet 40 mg  40 mg Oral Daily [place], Eric J, DO   40 mg at 07/04/23 5643   senna-docusate (Senokot-S) tablet 1 tablet  1 tablet Oral QHS PRN Shahmehdi, Seyed A, MD       sodium chloride flush (NS) 0.9 % injection 3 mL  3 mL Intravenous Q12H Shahmehdi, Seyed A, MD   3 mL at 07/04/23 0815   sodium chloride flush (NS) 0.9 % injection 3 mL  3 mL Intravenous Q12H Shahmehdi, Seyed A, MD   3 mL at 07/04/23 0813   sodium chloride flush (NS) 0.9 % injection 3 mL  3 mL Intravenous PRN Nevin Bloodgood A, MD   3 mL at 07/04/23 0813   sodium phosphate (FLEET) enema 1 enema  1 enema Rectal Once PRN Kendell Bane, MD         Discharge Medications: Please see discharge summary for a list of discharge medications.  Relevant Imaging Results:  Relevant Lab Results:   Additional Information SS#: [id number]  Baldemar Lenis, LCSW

## 2023-07-04 NOTE — Progress Notes (Signed)
PROGRESS NOTE    Jacob Mcgrath.  WUJ:[id number] DOB: Apr 13, 1956 DOA: 07/01/2023 PCP: Benetta Spar, MD    Brief Narrative:   Jacob Mountford. is a 67 y.o. 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  History obtained from unit manager at his facility.  Last known normal 7 PM day prior to admission.  At 7 AM they noticed that he was less responsive than usual and felt to be cold and clammy.  Also thought the right side of his mouth was drooping and he was not talking.  At baseline able to ambulate short distances with a cane but requires a wheelchair for longer distance.  He is typically oriented x 1 but conversant with staff at the facility.  Patient not able to provide any further history.  In the ED, temperature 98.6 F, HR 99, RR 16, BP 150/94, SpO2 84% on room air and placed on 4 L nasal cannula.  WBC 20.7, hemoglobin 13.6, platelet count 263.  Sodium 138, potassium 5.1, chloride 104, CO2 23, glucose 137, BUN 33, creat 1.23.  AST 53, ALT 25, total bilirubin 0.5.  Lactic acid 2.4.  Procalcitonin 12.62.  INR 1.2.  Urinalysis with large leukocytes, negative nitrite, rare bacteria, 21-50 WBCs.  UDS negative.  EtOH level less than 10.  Chest x-ray with heterogeneous opacities right pericardiac region.  Abdomen x-ray with nonspecific bowel gas pattern with scattered stool.  CT head without contrast with no acute intracranial process.  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  MRI brain without contrast with no acute intracranial process.  Neurology was consulted and given concern for seizure patient was transferred to [facility] for EEG.  Assessment & Plan:   Acute metabolic encephalopathy: improving Patient presenting from SNF with confusion, concern for right-sided facial droop and being less responsive.  Typically is alert and oriented x 1 and more conversant.  Does have history of CVA; no reported history of seizures but is on Keppra outpatient.  Patient was afebrile, noted to be hypoxic with SpO2 84% on room air on arrival with elevated WC count of 20K, lactic acid 2.4, elevated procalcitonin and chest x-ray with concerning findings for possible pneumonia and possible UTI on urinalysis.  TSH within normal limits.  Also concern for seizure activity and patient was transferred to [facility] for EEG.  Etiology unclear but possibly multifactorial given possible pneumonia, UTI and seizure. 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 CT head without contrast with no acute intracranial process.  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  MRI brain without contrast with no acute intracranial process. EEG with evidence of epileptogenicity right central temporal region, cortical  dysfunction right hemisphere likely secondary to underlying structural abnormality/postictal state, moderate diffuse encephalopathy, no definite seizures.  Evaluated by neurology during hospitalization, now signed off. -- Keppra  500 mg PO every 12 hours -- SLP recommends dysphagia 1 (pure diet), thin liquids -- PT/OT recommend return to SNF -- Seizure precautions  Community-acquired pneumonia Patient afebrile, hypoxic on admission with SpO2 84% on room air and placed on supple oxygen.  WBC count elevated 20.7 with elevated lactic acid and procalcitonin.  Chest x-ray with heterogeneous opacities right pericardiac region. -- WBC 20.7>16.7>15.1 -- Lactic acid 2.4>1.7>1.1 -- PCT 12.62>7.11>4.00>2.43 -- Blood cultures x 2: No growth x 2 days -- Azithromycin 500 mg p.o. every 24 hours x 5 days -- Ceftriaxone 2 g IV every 24 hours x 5 days -- Continue to trend WBC -- Aspiration precautions  Urinary tract infection Urinalysis concerning for UTI.  History of Citrobacter and E. coli UTI -- Urine culture: Multiple species present -- Continue ceftriaxone as above  Essential hypertension -- Metoprolol tartrate 25 g p.o. twice daily  Hyperlipidemia -- Atorvastatin 80 mg p.o. daily  Type 2 diabetes mellitus Hemoglobin A1c 5.4, well-controlled. -- Very sensitive SSI for coverage  Dementia Patient currently resides at [facility], usually alert and oriented x 1. -- Palliative care following for goals of care and medical decision making   DVT prophylaxis: heparin injection 5,000 Units Start: 07/01/23 2200 TED hose Start: 07/01/23 1309 SCDs Start: 07/01/23 1309    Code Status: Full Code Family Communication: No family present at bedside this morning  Disposition Plan:  Level of care: Telemetry Medical Status is: Inpatient Remains inpatient appropriate because: IV antibiotics, anticipate likely discharge back to SNF in tomorrow  Consultants:  Neurology Palliative care  Procedures:  EEG  Antimicrobials:  Azithromycin 12/7>> Ceftriaxone 12/7>>   Subjective: Patient seen examined bedside, lying in bed.  EEG now discontinued.  Remains oriented to self.  Believes he is at the "[place]", year is 2004  and the president is currently Felix Pacini.  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  Remains on IV antibiotics.  Denies pain, no shortness of breath, no fever, no chest pain.  Unable to obtain any further ROS given his underlying dementia.  No acute events overnight per nursing staff.  Anticipate discharge back to SNF tomorrow.  Objective: Vitals:   07/04/23 0428 07/04/23 0500 07/04/23 0744 07/04/23 1119  BP: 124/81  135/86 118/63  Pulse: (!) 104   91  Resp: 20  20   Temp: 99.6 F (37.6 C)  99.3 F (37.4 C) 97.9 F (36.6 C)  TempSrc: Oral  Oral Oral  SpO2: 97%  99% 97%  Weight:  57.4 kg    Height:        Intake/Output Summary (Last 24 hours) at 07/04/2023 1225 Last data filed at 07/04/2023 1478 Gross per 24 hour  Intake 703 ml  Output 750 ml  Net -47 ml   Filed Weights   07/02/23 0642 07/03/23 0500 07/04/23 0500  Weight: 62.5 kg 57.5 kg 57.4 kg    Examination:  Physical Exam: GEN: NAD, alert and oriented to self, chronically ill/elderly in appearance, appears older than stated age HEENT: NCAT, PERRL, EOMI, sclera clear, MMM PULM: Breath sounds slight diminished bilateral bases, no wheezing/crackles, normal respiratory effort that accessory muscle use, on room air CV: RRR w/o M/G/R GI: abd soft, NTND, NABS, no R/G/M MSK: no peripheral edema, moves all extremities independently NEURO: CN II-XII intact, + weakness left UE/LE greater than right which  is his baseline PSYCH: Depressed mood, flat affect Integumentary: No concerning rashes/lesions/wounds noted on exposed skin surfaces    Data Reviewed: I have personally reviewed following labs and imaging studies  CBC: Recent Labs  Lab 07/01/23 1236 07/02/23 0449 07/03/23 0641 07/04/23 0616  WBC 20.7* 18.5* 16.7* 15.1*  NEUTROABS 16.3*  --   --   --   HGB 13.6 12.8* 13.0 12.5*  HCT 44.1 38.5* 39.5 38.7*  MCV 94.8 89.1 89.4 91.1  PLT 263 228 215 232   Basic Metabolic Panel: Recent Labs  Lab 07/01/23 1236 07/02/23 0449  07/03/23 0641 07/04/23 0616  NA 138 139 144 148*  K 5.1 3.9 3.9 3.7  CL 104 109 113* 115*  CO2 23 21* 21* 22  GLUCOSE 137* 134* 114* 145*  BUN 23 18 22  28*  CREATININE 1.23 1.01 1.13 1.17  CALCIUM 8.7* 9.0 9.1 9.0  MG 1.9  --  1.7  --   PHOS 5.2*  --  2.0*  --    GFR: Estimated Creatinine Clearance: 49.7 mL/min (by C-G formula based on SCr of 1.17 mg/dL). Liver Function Tests: Recent Labs  Lab 07/01/23 1236  AST 53*  ALT 25  ALKPHOS 79  BILITOT 0.5  PROT 7.1  ALBUMIN 3.5   No results for input(s): "LIPASE", "AMYLASE" in the last 168 hours. No results for input(s): "AMMONIA" in the last 168 hours. Coagulation Profile: Recent Labs  Lab 07/01/23 1236 07/02/23 0449  INR 1.2 1.3*   Cardiac Enzymes: No results for input(s): "CKTOTAL", "CKMB", "CKMBINDEX", "TROPONINI" in the last 168 hours. BNP (last 3 results) No results for input(s): "PROBNP" in the last 8760 hours. HbA1C: Recent Labs    07/01/23 1236  HGBA1C 5.4   CBG: Recent Labs  Lab 07/03/23 1141 07/03/23 1608 07/03/23 2117 07/04/23 0627 07/04/23 1117  GLUCAP 156* 190* 123* 176* 203*   Lipid Profile: No results for input(s): "CHOL", "HDL", "LDLCALC", "TRIG", "CHOLHDL", "LDLDIRECT" in the last 72 hours. Thyroid Function Tests: Recent Labs    07/01/23 1236  TSH 2.060   Anemia Panel: No results for input(s): "VITAMINB12", "FOLATE", "FERRITIN", "TIBC", "IRON", "RETICCTPCT" in the last 72 hours. Sepsis Labs: Recent Labs  Lab 07/01/23 1236 07/01/23 1405 07/02/23 0759 07/03/23 0641 07/04/23 0616  PROCALCITON 12.62  --  7.11 4.00 2.43  LATICACIDVEN  --  2.4* 1.7 1.1  --     Recent Results (from the past 240 hour(s))  Urine Culture (for pregnant, neutropenic or urologic patients or patients with an indwelling urinary catheter)     Status: Abnormal   Collection Time: 07/02/23 11:27 AM   Specimen: Urine, Clean Catch  Result Value Ref Range Status   Specimen Description URINE, CLEAN CATCH  Final    Special Requests   Final    NONE Performed at [facility], [address], [place], [place] [address]    Culture (A)  Final    20,000 COLONIES/mL MULTIPLE SPECIES PRESENT, SUGGEST RECOLLECTION   Report Status 07/04/2023 FINAL  Final  Culture, blood (Routine X 2) w Reflex to ID Panel     Status: None (Preliminary result)   Collection Time: 07/02/23  2:55 PM   Specimen: BLOOD  Result Value Ref Range Status   Specimen Description BLOOD BLOOD LEFT HAND  Final   Special Requests   Final    BOTTLES DRAWN AEROBIC AND ANAEROBIC Blood Culture results may not be optimal due to an inadequate volume of blood received in culture bottles   Culture  Final    NO GROWTH 2 DAYS Performed at [facility], [address], [place], [place] [address]    Report Status PENDING  Incomplete  Culture, blood (Routine X 2) w Reflex to ID Panel     Status: None (Preliminary result)   Collection Time: 07/02/23  2:55 PM   Specimen: BLOOD  Result Value Ref Range Status   Specimen Description BLOOD BLOOD RIGHT HAND  Final   Special Requests   Final    BOTTLES DRAWN AEROBIC AND ANAEROBIC Blood Culture results may not be optimal due to an inadequate volume of blood received in culture bottles   Culture   Final    NO GROWTH 2 DAYS Performed at [facility], [address], [place], [place] [address]    Report Status PENDING  Incomplete         Radiology Studies: No results found.      Scheduled Meds:  atorvastatin  80 mg Oral QHS   azithromycin  500 mg Oral Daily   feeding supplement  237 mL Oral BID BM   heparin  5,000 Units Subcutaneous Q8H   insulin aspart  0-6 Units Subcutaneous TID WC   levETIRAcetam  500 mg Oral BID   metoprolol tartrate  25 mg Oral BID   nicotine  14 mg Transdermal Daily   pantoprazole  40 mg Oral Daily   sodium chloride flush  3 mL Intravenous Q12H   sodium chloride flush  3 mL Intravenous Q12H   Continuous Infusions:  sodium chloride      cefTRIAXone (ROCEPHIN)  IV 2 g (07/03/23 1136)     LOS: 3 days    Time spent: 52 minutes spent on chart review, discussion with nursing staff, consultants, updating family and interview/physical exam; more than 50% of that time was spent in counseling and/or coordination of care.    Alvira Philips [place], DO [facility] Available via Epic secure chat 7am-7pm After these hours, please refer to coverage provider listed on [email or web address] 07/04/2023, 12:25 PM

## 2023-07-04 NOTE — Care Management Important Message (Signed)
Important Message  Patient Details  Name: Jacob Mcgrath. MRN: [id number] Date of Birth: July 19, 1956   Important Message Given:  Yes - Medicare IM  Verbal consent obtained from Esaw Dace who is the patient Legal Guardian     Chau Sawin Stefan Church 07/04/2023, 4:04 PM

## 2023-07-04 NOTE — Plan of Care (Signed)
  Problem: Clinical Measurements: Goal: Will remain free from infection Outcome: Progressing   Problem: Nutrition: Goal: Adequate nutrition will be maintained Outcome: Progressing   Problem: Coping: Goal: Level of anxiety will decrease Outcome: Progressing   Problem: Elimination: Goal: Will not experience complications related to bowel motility Outcome: Progressing   Problem: Pain Management: Goal: General experience of comfort will improve Outcome: Progressing

## 2023-07-04 NOTE — Procedures (Addendum)
Patient Name: Jacob Mcgrath.  MRN: [id number]  Epilepsy Attending: Charlsie Quest  Referring Physician/Provider: Erick Blinks, MD  Duration: 07/04/2023 1308 to 07/04/2023 6578   Patient history: 67yo M with left sided weakness getting eeg to evaluate for seizure    Level of alertness: Awake, asleep   AEDs during EEG study: LEV   Technical aspects: This EEG study was done with scalp electrodes positioned according to the 10-20 International system of electrode placement. Electrical activity was reviewed with band pass filter of 1-70Hz , sensitivity of 7 uV/mm, display speed of 11mm/sec with a 60Hz  notched filter applied as appropriate. EEG data were recorded continuously and digitally stored.  Video monitoring was available and reviewed as appropriate.   Description: The posterior dominant rhythm consists of 8 Hz activity of moderate voltage (25-35 uV) seen predominantly in posterior head regions, asymmetric (right< left) and reactive to eye opening and eye closing.  Sleep was characterized by vertex waves, sleep spindles (12 to 14 Hz), maximal frontocentral region. EEG showed continuous 3 to 6 Hz theta-delta slowing in right hemisphere in right hemisphere as well as intermittent generalized 3-6Hz  theta-delta slowing. Lateralized periodic discharges were noted in right hemisphere, maximal right centro-temporo-parietal region at 0.25-1Hz . Hyperventilation and photic stimulation were not performed.     ABNORMALITY - Lateralized periodic discharges,  right hemisphere, maximal right centro-temporal region ( LPD ) - Continuous slow, lateralized right hemisphere - Intermittent slow, generalized   IMPRESSION: This study showed evidence of epileptogenicity  and cortical dysfunction arising from right hemisphere, maximal centro-temporal region likely due to underlying structural abnormality, post-ictal state. Additionally there was mild diffuse encephalopathy. No definite seizures  were seen  throughout the recording.   Londa Mackowski Annabelle Harman
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
## 2023-07-05 DIAGNOSIS — R569 Unspecified convulsions: Secondary | ICD-10-CM | POA: Diagnosis not present

## 2023-07-05 DIAGNOSIS — G9341 Metabolic encephalopathy: Secondary | ICD-10-CM | POA: Diagnosis not present

## 2023-07-05 DIAGNOSIS — R627 Adult failure to thrive: Secondary | ICD-10-CM

## 2023-07-05 DIAGNOSIS — N3000 Acute cystitis without hematuria: Secondary | ICD-10-CM | POA: Diagnosis not present

## 2023-07-05 LAB — BASIC METABOLIC PANEL
Anion gap: 11 (ref 5–15)
BUN: 27 mg/dL — ABNORMAL HIGH (ref 8–23)
CO2: 23 mmol/L (ref 22–32)
Calcium: 8.9 mg/dL (ref 8.9–10.3)
Chloride: 115 mmol/L — ABNORMAL HIGH (ref 98–111)
Creatinine, Ser: 1.26 mg/dL — ABNORMAL HIGH (ref 0.61–1.24)
GFR, Estimated: >60 mL/min (ref >60)
Glucose, Bld: 132 mg/dL — ABNORMAL HIGH (ref 70–99)
Potassium: 3.5 mmol/L (ref 3.5–5.1)
Sodium: 149 mmol/L — ABNORMAL HIGH (ref 135–145)

## 2023-07-05 LAB — CBC
HCT: 35.1 % — ABNORMAL LOW (ref 39.0–52.0)
Hemoglobin: 11.4 g/dL — ABNORMAL LOW (ref 13.0–17.0)
MCH: 29.5 pg (ref 26.0–34.0)
MCHC: 32.5 g/dL (ref 30.0–36.0)
MCV: 90.7 fL (ref 80.0–100.0)
Platelets: 223 10*3/uL (ref 150–400)
RBC: 3.87 MIL/uL — ABNORMAL LOW (ref 4.22–5.81)
RDW: 13.7 % (ref 11.5–15.5)
WBC: 14.5 10*3/uL — ABNORMAL HIGH (ref 4.0–10.5)
nRBC: 0 % (ref 0.0–0.2)

## 2023-07-05 LAB — GLUCOSE, CAPILLARY
Glucose-Capillary: 124 mg/dL — ABNORMAL HIGH (ref 70–99)
Glucose-Capillary: 143 mg/dL — ABNORMAL HIGH (ref 70–99)

## 2023-07-05 MED ORDER — AZITHROMYCIN 500 MG PO TABS: 500.0000 mg | ORAL_TABLET | Freq: Every day | ORAL | 0 refills | Status: AC

## 2023-07-05 MED ORDER — CEFDINIR 300 MG PO CAPS: 300.0000 mg | ORAL_CAPSULE | Freq: Two times a day (BID) | ORAL | 0 refills | Status: AC

## 2023-07-05 MED ORDER — POTASSIUM CHLORIDE 20 MEQ PO PACK
40.0000 meq | PACK | Freq: Once | ORAL | Status: AC
  Administered 2023-07-05: 40 meq via ORAL
  Filled 2023-07-05: qty 2

## 2023-07-05 NOTE — TOC Transition Note (Signed)
Transition of Care [facility]) - CM/SW Discharge Note   Patient Details  Name: Jacob Mcgrath. MRN: [id number] Date of Birth: 14-Sep-1955  Transition of Care [facility]) CM/SW Contact:  Baldemar Lenis, [place] Phone Number: 07/05/2023, 12:09 PM   Clinical Narrative:   CSW updated by MD that patient is stable to return to SNF today. CSW updated [facility], they are able to accept back. CSW sent discharge summary. CSW attempted to notify guardian, left a voicemail to update about discharge. Transport arranged with [facility] for next available.  Nurse to call report to [phone], Room A7 Bed 2      Barriers to Discharge: Barriers Resolved   Patient Goals and CMS Choice CMS Medicare.gov Compare Post Acute Care list provided to:: Legal Guardian    Discharge Placement                Patient chooses bed at:  [facility]) Patient to be transferred to facility by: [facility] Name of family member notified: Dorathy Daft, left a voicemail Patient and family notified of of transfer: 07/05/23  Discharge Plan and Services Additional resources added to the After Visit Summary for   In-house Referral: Clinical Social Work   Post Acute Care Choice: Resumption of Svcs/PTA Provider                               Social Determinants of Health (SDOH) Interventions SDOH Screenings   Food Insecurity: No Food Insecurity (07/01/2023)  Housing: Patient Unable To Answer (07/01/2023)  Transportation Needs: No Transportation Needs (07/01/2023)  Utilities: Not At Risk (07/01/2023)  Tobacco Use: High Risk (07/01/2023)     Readmission Risk Interventions    03/01/2023    2:40 PM  Readmission Risk Prevention Plan  Transportation Screening Complete  PCP or Specialist Appt within 5-7 Days Not Complete  Home Care Screening Complete  Medication Review (RN CM) Complete

## 2023-07-05 NOTE — Discharge Summary (Signed)
Physician Discharge Summary  Jacob Mcgrath. VHQ:[id number] DOB: [birth year] DOA: 07/01/2023  PCP: Benetta Spar, MD  Admit date: 07/01/2023 Discharge date: 07/05/2023  Admitted From: [facility] SNF Disposition: [facility]  Recommendations for Outpatient Follow-up:  Follow up with PCP in 1-2 weeks Continue antibiotics with azithromycin and ceftriaxone to complete antibiotic course for UTI/pneumonia Please obtain BMP/CBC in one week Recommend to continue goals of care conversations outpatient with patient's legal guardian, Esaw Dace for consideration of DNR status, no feeding tube and at minimum palliative care to follow-up.  May benefit from hospice care in the future with expectation of ongoing failure to thrive.  Overall long-term prognosis poor/guarded  Discharge Condition: Stable CODE STATUS: Full code  Diet recommendation:  Dysphagia 1 (Puree);Thin liquid   Liquid Administration via: Cup;Straw Medication Administration: Whole meds with puree Supervision: Patient able to self feed;Full supervision/cueing for compensatory strategies Compensations: Small sips/bites;Slow rate Postural Changes: Seated upright at 90 degrees   History of present illness:  Jacob Macaskill. is a 67 y.o. 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  History obtained from unit manager at his facility.  Last known normal 7 PM day prior to admission.  At 7 AM they noticed that he was less responsive than usual and felt to be cold and clammy.  Also thought the right side of his mouth was drooping and he was not talking.  At baseline able to ambulate short distances with a cane but requires a wheelchair for longer distance.  He is typically oriented x 1 but  conversant with staff at the facility.  Patient not able to provide any further history.   In the ED, temperature 98.6 F, HR 99, RR 16, BP 150/94, SpO2 84% on room air and placed on 4 L nasal cannula.  WBC 20.7, hemoglobin 13.6, platelet count 263.  Sodium 138, potassium 5.1, chloride 104, CO2 23, glucose 137, BUN 33, creat 1.23.  AST 53, ALT 25, total bilirubin 0.5.  Lactic acid 2.4.  Procalcitonin 12.62.  INR 1.2.  Urinalysis with large leukocytes, negative nitrite, rare bacteria, 21-50 WBCs.  UDS negative.  EtOH level less than 10.  Chest x-ray with heterogeneous opacities right pericardiac region.  Abdomen x-ray with nonspecific bowel gas pattern with scattered stool.  CT head without contrast with no acute intracranial process.  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  MRI brain without contrast with no acute intracranial process.  Neurology was consulted and given concern for seizure patient was transferred to [facility] for EEG.  Hospital course:  Acute metabolic encephalopathy: Resolved Patient presenting from SNF with confusion, concern for right-sided facial droop and being less responsive.  Typically is alert and oriented x 1 and more conversant.  Does have history of CVA; no reported history of seizures but is on Keppra outpatient.  Patient was afebrile, noted to be hypoxic with SpO2 84% on room air on arrival with elevated WC count of 20K, lactic acid 2.4, elevated procalcitonin and chest x-ray with concerning findings for possible  pneumonia and possible UTI on urinalysis.  TSH within normal limits.  Also concern for seizure activity and patient was transferred to [facility]  for EEG.  Etiology unclear but possibly multifactorial given possible pneumonia, UTI and seizure.   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 CT head without contrast with no acute intracranial process.  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  MRI brain without contrast with no acute intracranial process. EEG with evidence of epileptogenicity right central temporal region, cortical dysfunction right hemisphere likely secondary to underlying structural abnormality/postictal state, moderate diffuse encephalopathy, no definite seizures.  Evaluated by neurology during hospitalization, now signed off. Keppra 500 mg PO every 12 hours. SLP recommends dysphagia 1 (pure diet), thin liquids. PT/OT recommend return to SNF. Seizure precautions   Community-acquired pneumonia Patient afebrile, hypoxic on admission with SpO2 84% on room air and placed on supple oxygen.  WBC count elevated 20.7 with elevated lactic acid and procalcitonin.  Chest x-ray with heterogeneous opacities right pericardiac region.  Patient was started on IV azithromycin and ceftriaxone with improvement.  Blood cultures showed no growth during hospitalization.  WBC count trended down to 14.5 at time of discharge.  Will continue azithromycin and cefdinir to complete antibiotic course.  Recommend repeat CBC 1 week.  Remains on room air with adequate oxygenation.   Urinary tract infection Urinalysis concerning for UTI.  History of Citrobacter and E. coli UTI. Urine culture: Multiple species present.  Continue antibiotics as above.    Essential hypertension Metoprolol tartrate 25 g p.o. twice daily   Hyperlipidemia Atorvastatin 80 mg p.o. daily   Type 2 diabetes mellitus Hemoglobin A1c 5.4, well-controlled.   Dementia Patient currently resides at [facility], usually alert and oriented x 1.  Palliative care was consulted and followed during hospital course for assistance with goals of care and medical decision making. Recommend to continue goals of care conversations outpatient with patient's legal guardian, Esaw Dace for consideration of DNR status, no feeding tube and at minimum palliative care to follow-up.  May benefit from hospice care in the future with expectation of ongoing failure to thrive.  Overall long-term prognosis poor/guarded    Discharge Diagnoses:  Principal Problem:   Stroke [facility]) Active Problems:   Ischemic leg   Depression   Leukocytosis   Tremor   Hypertension   Diabetes mellitus without complication (HCC)   GERD (gastroesophageal reflux disease)   Seizure [facility])    Discharge Instructions  Discharge Instructions     Call MD for:  difficulty breathing, headache or visual disturbances   Complete by: As directed    Call MD for:  extreme fatigue   Complete by: As directed    Call MD for:  persistant dizziness or light-headedness   Complete by: As directed    Call MD for:  persistant nausea and vomiting   Complete by: As directed    Call MD for:  severe uncontrolled pain   Complete by: As directed    Call MD for:  temperature >100.4   Complete by: As directed    Diet - low sodium heart healthy   Complete by: As directed  Increase activity slowly   Complete by: As directed       Allergies as of 07/05/2023       Reactions   Amoxicillin Other (See Comments)   Unknown reaction per facility   Ampicillin Other (See Comments)   Unknown reaction per facility   Tetracyclines & Related Rash        Medication List     STOP taking these medications    gabapentin  300 MG capsule Commonly known as: NEURONTIN   midodrine 5 MG tablet Commonly known as: PROAMATINE       TAKE these medications    acetaminophen 325 MG tablet Commonly known as: TYLENOL Take 325 mg by mouth every 6 (six) hours as needed for mild pain (pain score 1-3).   aspirin EC 81 MG tablet Take 81 mg by mouth daily. Swallow whole.   atorvastatin 80 MG tablet Commonly known as: LIPITOR Take 80 mg by mouth at bedtime.   azithromycin 500 MG tablet Commonly known as: ZITHROMAX Take 1 tablet (500 mg total) by mouth daily for 1 day. Start taking on: July 07, 2023   Baclofen 5 MG Tabs Take 1 tablet by mouth in the morning, at noon, and at bedtime.   BIOFREEZE EX Apply 1 Application topically at bedtime.   cefdinir 300 MG capsule Commonly known as: OMNICEF Take 1 capsule (300 mg total) by mouth 2 (two) times daily for 4 days.   clopidogrel 75 MG tablet Commonly known as: PLAVIX Take 1 tablet (75 mg total) by mouth daily with breakfast.   DULoxetine 30 MG capsule Commonly known as: CYMBALTA Take 30 mg by mouth daily.   levETIRAcetam 500 MG tablet Commonly known as: KEPPRA Take 500 mg by mouth 2 (two) times daily.   melatonin 3 MG Tabs tablet Take 2 tablets (6 mg total) by mouth at bedtime.   metoprolol tartrate 25 MG tablet Commonly known as: LOPRESSOR Take 1 tablet (25 mg total) by mouth 2 (two) times daily.   MUCINEX MAXIMUM STRENGTH PO Take 600 mg by mouth every 12 (twelve) hours as needed (cough/congestion).   multivitamin tablet Take 1 tablet by mouth daily.   pantoprazole 40 MG tablet Commonly known as: PROTONIX Take 1 tablet (40 mg total) by mouth daily.   PARoxetine 40 MG tablet Commonly known as: PAXIL Take 40 mg by mouth every morning.   tamsulosin 0.4 MG Caps capsule Commonly known as: FLOMAX Take 1 capsule (0.4 mg total) by mouth daily.        Contact information for follow-up providers     Fanta, Wayland Salinas, MD. Schedule  an appointment as soon as possible for a visit in 1 week(s).   Specialty: Internal Medicine Contact information: [address] [place] [address] [phone]              Contact information for after-discharge care     Destination     [facility] .   Service: Skilled Nursing Contact information: [address] [place] [address] [phone]                    Allergies  Allergen Reactions   Amoxicillin Other (See Comments)    Unknown reaction per facility   Ampicillin Other (See Comments)    Unknown reaction per facility   Tetracyclines & Related Rash    Consultations: Palliative care Neurology   Procedures/Studies: Overnight EEG with video  Result Date: 07/02/2023 Lindie Spruce  Val Eagle, MD     07/03/2023  8:45 AM Patient Name: Jacob Kallio. MRN: [id number] Epilepsy Attending: Charlsie Quest Referring Physician/Provider: Erick Blinks, MD Duration: 07/02/2023 9147 to 07/03/2023 8295 Patient history: 67yo M with left sided weakness getting eeg to evaluate for seizure Level of alertness: Awake, asleep AEDs during EEG study: LEV Technical aspects: This EEG study was done with scalp electrodes positioned according to the 10-20 International system of electrode placement. Electrical activity was reviewed with band pass filter of 1-70Hz , sensitivity of 7 uV/mm, display speed of 49mm/sec with a 60Hz  notched filter applied as appropriate. EEG data were recorded continuously and digitally stored.  Video monitoring was available and reviewed as appropriate. Description: No clear posterior dominant rhythm was seen. Sleep was characterized by vertex waves, sleep spindles (12 to 14 Hz), maximal frontocentral region. EEG showed continuous generalized and lateralized right hemisphere 3 to 6 Hz theta-delta slowing in right hemisphere. Lateralized periodic discharges were noted in right hemisphere,  maximal right centro-temporo-parietal region at 0.25-1Hz , at times with overlying rhythmicity. Hyperventilation and photic stimulation were not performed.  ABNORMALITY - Lateralized periodic discharges with overlying rhythmicity,  right hemisphere, maximal right centro-temporal region ( LPD +R) - Continuous slow, generalized and lateralized right hemisphere  IMPRESSION: This study showed evidence of epileptogenicity arising from right centro-temporal region. This eeg pattern is on the ictal-interictal continuum with increased risk of seizure recurrence. Additionally there was cortical dysfunction arising from right hemisphere likely secondary to underlying structural abnormality, post-ictal state. Lastly there was moderate diffuse encephalopathy. No definite seizures  were seen throughout the recording.  Charlsie Quest   EEG adult  Result Date: 07/01/2023 Charlsie Quest, MD     07/01/2023  5:21 PM Patient Name: Jacob Sayed. MRN: [id number] Epilepsy Attending: Charlsie Quest Referring Physician/Provider: Kendell Bane, MD Date: 07/01/2023 Duration: 24.21 mins Patient history: 67yo M with  left sided weakness getting eeg to evaluate for seizure Level of alertness: Awake AEDs during EEG study: None Technical aspects: This EEG study was done with scalp electrodes positioned according to the 10-20 International system of electrode placement. Electrical activity was reviewed with band pass filter of 1-70Hz , sensitivity of 7 uV/mm, display speed of 75mm/sec with a 60Hz  notched filter applied as appropriate. EEG data were recorded continuously and digitally stored.  Video monitoring was available and reviewed as appropriate. Description: The posterior dominant rhythm consists of 8 Hz activity of moderate voltage (25-35 uV) seen predominantly in posterior head regions, asymmetric ( right<left) and reactive to eye opening and eye closing. EEG showed continuous 3 to 6 Hz theta-delta slowing in right  hemisphere. Sharp waves were noted in right centro-temporal region. Intermittent generalized 3-5hz  theta-delta slowing was also noted. Hyperventilation and photic stimulation were not performed.   Of note, parts of study were difficult to interpret due to significant myogenic artifact. ABNORMALITY - Sharp wave, right centro-temporal region - Continuous slow, right hemisphere - Intermittent slow, generalized - Background asymmetry, right<left IMPRESSION: This technically difficult study showed evidence of epileptogenicity arising from right centro-temporal region. Additionally there is cortical dysfunction arising from right hemisphere likely secondary to underlying structural abnormality, post-ictal state. Lastly there was  mild diffuse encephalopathy. No definite seizures  were seen throughout the recording. Charlsie Quest   MR BRAIN WO CONTRAST  Result Date: 07/01/2023 CLINICAL DATA:  Altered mental status, neglect, stroke suspected EXAM: MRI HEAD WITHOUT CONTRAST TECHNIQUE: Multiplanar, multiecho pulse sequences of the brain and surrounding structures were obtained without intravenous  contrast. COMPARISON:  01/03/2023 MRI head, correlation is also made with 07/01/2023 CT head FINDINGS: Brain: No restricted diffusion to suggest acute or subacute infarct. No acute hemorrhage, mass, mass effect, or midline shift. No hydrocephalus or extra-axial collection. Craniocervical junction within normal limits. Hemosiderin deposition in the right frontal and parietal lobes, associated with an area of encephalomalacia, likely petechial hemorrhage from a remote MCA territory infarct. Advanced cerebral volume loss for age, with ex vacuo dilatation of the ventricles. Ventricles and sulci are commensurate in size Vascular: Loss of the proximal left V4 flow void (series 10, image 5), which is new from the prior MRI but may correlate with an area of stenosis on the same-day CTA. Otherwise normal flow voids. Skull and upper  cervical spine: Normal marrow signal. Sinuses/Orbits: Mucous retention cyst in the right maxillary sinus and posterior right ethmoid air cells. No acute finding in the orbits. Other: The mastoid air cells are well aerated. IMPRESSION: No acute intracranial process. No evidence of acute or subacute infarct. Electronically Signed   By: Wiliam Ke M.D.   On: 07/01/2023 16:13   ECHOCARDIOGRAM COMPLETE  Result Date: 07/01/2023    ECHOCARDIOGRAM REPORT   Patient Name:   Jacob Mcgrath. Date of Exam: 07/01/2023 Medical Rec #:  [id number]          Height:       69.0 in Accession #:    [id number]         Weight:       127.0 lb Date of Birth:  July 21, 1956         BSA:          1.703 m Patient Age:    89 years           BP:           165/90 mmHg Patient Gender: M                  HR:           96 bpm. [facility] Procedure: 2D Echo, Color Doppler, Cardiac Doppler and Intracardiac            Opacification Agent Indications:    Stroke I63.9  History:        Patient has no prior history of Echocardiogram examinations.                 Stroke; Risk Factors:Hypertension and Diabetes.  Sonographer:    Celesta Gentile RCS Referring Phys: [phone] SEYED A SHAHMEHDI IMPRESSIONS  1. Left ventricular ejection fraction, by estimation, is 55 to 60%. The left ventricle has normal function. Left ventricular endocardial border not optimally defined to evaluate regional wall motion - basal septum and basal posterior wall dyssynchronous  but contractile. Left ventricular diastolic parameters are consistent with Grade I diastolic dysfunction (impaired relaxation).  2. Definity contrast shows no LV mural thrombus.  3. Right ventricular systolic function is low normal. The right ventricular size is normal. There is normal pulmonary artery systolic pressure. The estimated right ventricular systolic pressure is 29.6 mmHg.  4. The mitral valve is degenerative. Trivial mitral valve regurgitation.  5. The aortic valve has an  indeterminant number of cusps. There is moderate calcification of the aortic valve. Aortic valve regurgitation is not visualized. Aortic valve sclerosis/calcification is present, without any evidence of aortic stenosis.  6. The inferior vena cava is normal in size with greater than 50% respiratory variability, suggesting right atrial pressure of 3 mmHg. Comparison(s):  No prior Echocardiogram. FINDINGS  Left Ventricle: Left ventricular ejection fraction, by estimation, is 55 to 60%. The left ventricle has normal function. Left ventricular endocardial border not optimally defined to evaluate regional wall motion. Definity contrast agent was given IV to delineate the left ventricular endocardial borders. The left ventricular internal cavity size was normal in size. There is no left ventricular hypertrophy. Left ventricular diastolic parameters are consistent with Grade I diastolic dysfunction (impaired relaxation). Right Ventricle: The right ventricular size is normal. No increase in right ventricular wall thickness. Right ventricular systolic function is low normal. There is normal pulmonary artery systolic pressure. The tricuspid regurgitant velocity is 2.58 m/s,  and with an assumed right atrial pressure of 3 mmHg, the estimated right ventricular systolic pressure is 29.6 mmHg. Left Atrium: Left atrial size was normal in size. Right Atrium: Right atrial size was normal in size. Pericardium: There is no evidence of pericardial effusion. Mitral Valve: The mitral valve is degenerative in appearance. There is mild calcification of the mitral valve leaflet(s). Trivial mitral valve regurgitation. Tricuspid Valve: The tricuspid valve is grossly normal. Tricuspid valve regurgitation is mild. Aortic Valve: The aortic valve has an indeterminant number of cusps. There is moderate calcification of the aortic valve. There is mild to moderate aortic valve annular calcification. Aortic valve regurgitation is not visualized. Aortic  valve sclerosis/calcification is present, without any evidence of aortic stenosis. Pulmonic Valve: The pulmonic valve was not well visualized. Pulmonic valve regurgitation is trivial. Aorta: The aortic root is normal in size and structure. Venous: The inferior vena cava is normal in size with greater than 50% respiratory variability, suggesting right atrial pressure of 3 mmHg. IAS/Shunts: No atrial level shunt detected by color flow Doppler.  LEFT VENTRICLE PLAX 2D LVIDd:         4.10 cm   Diastology LVIDs:         3.70 cm   LV e' medial:    5.44 cm/s LV PW:         0.90 cm   LV E/e' medial:  11.0 LV IVS:        0.80 cm   LV e' lateral:   8.49 cm/s LVOT diam:     1.80 cm   LV E/e' lateral: 7.0 LV SV:         39 LV SV Index:   23 LVOT Area:     2.54 cm  RIGHT VENTRICLE RV S prime:     11.20 cm/s TAPSE (M-mode): 2.0 cm LEFT ATRIUM             Index        RIGHT ATRIUM           Index LA diam:        2.90 cm 1.70 cm/m   RA Area:     17.00 cm LA Vol (A2C):   42.4 ml 24.89 ml/m  RA Volume:   52.50 ml  30.82 ml/m LA Vol (A4C):   44.9 ml 26.36 ml/m LA Biplane Vol: 46.2 ml 27.12 ml/m  AORTIC VALVE LVOT Vmax:   95.30 cm/s LVOT Vmean:  62.600 cm/s LVOT VTI:    0.152 m  AORTA Ao Root diam: 2.70 cm MITRAL VALVE                TRICUSPID VALVE MV Area (PHT): 4.31 cm     TR Peak grad:   26.6 mmHg MV Decel Time: 176 msec     TR Vmax:  258.00 cm/s MV E velocity: 59.60 cm/s MV A velocity: 107.00 cm/s  SHUNTS MV E/A ratio:  0.56         Systemic VTI:  0.15 m                             Systemic Diam: 1.80 cm Nona Dell MD Electronically signed by Nona Dell MD Signature Date/Time: 07/01/2023/3:14:04 PM    Final    DG Abd 1 View  Result Date: 07/01/2023 CLINICAL DATA:  MRI clearance EXAM: ABDOMEN - 1 VIEW COMPARISON:  X-ray 02/28/2023 FINDINGS: Scattered colonic stool. Gas seen in nondilated loops of small and large bowel. No obstruction. Degenerative changes seen of the spine and pelvis particularly of the  right hip. Left hip arthroplasty seen at the edge of the imaging field, incompletely included. There is contrast in the renal collecting systems including the bladder which appears to has a trabeculated wall with some potential bladder diverticula. There is a vascular stent overlying the left iliac region. Please correlate for time course and type of stent. Overlapping cardiac leads. IMPRESSION: Nonspecific bowel gas pattern with scattered stool. Left hip prosthesis.  Degenerative changes. Left iliac vascular stents. Please correlate with the type and time of placement of stent. Please correlate for compatibility for MRI Electronically Signed   By: Karen Kays M.D.   On: 07/01/2023 14:47   DG Chest Portable 1 View  Result Date: 07/01/2023 CLINICAL DATA:  Hypoxia. EXAM: PORTABLE CHEST 1 VIEW COMPARISON:  02/27/2023. FINDINGS: Redemonstration of increased interstitial markings throughout bilateral lungs, grossly similar to the prior study. No frank pulmonary edema. There are heterogeneous opacities in the right paracardiac region. Differential diagnosis includes combination of vascular/interstitial markings versus superimposed atelectasis/pneumonitis. Correlate clinically. Bilateral lung fields are otherwise clear. Bilateral costophrenic angles are clear. Normal cardio-mediastinal silhouette. No acute osseous abnormalities. The soft tissues are within normal limits. IMPRESSION: *Heterogeneous opacities in the right paracardiac region, as discussed above. Electronically Signed   By: Jules Schick M.D.   On: 07/01/2023 13:56   CT HEAD CODE STROKE WO CONTRAST  Result Date: 07/01/2023 CLINICAL DATA:  Right gaze deviation, altered mental status, stroke suspected EXAM: CT ANGIOGRAPHY HEAD AND NECK CT PERFUSION BRAIN TECHNIQUE: Multidetector CT imaging of the head and neck was performed using the standard protocol during bolus administration of intravenous contrast. Multiplanar CT image reconstructions and MIPs  were obtained to evaluate the vascular anatomy. Carotid stenosis measurements (when applicable) are obtained utilizing NASCET criteria, using the distal internal carotid diameter as the denominator. Multiphase CT imaging of the brain was performed following IV bolus contrast injection. Subsequent parametric perfusion maps were calculated using RAPID software. RADIATION DOSE REDUCTION: This exam was performed according to the departmental dose-optimization program which includes automated exposure control, adjustment of the mA and/or kV according to patient size and/or use of iterative reconstruction technique. CONTRAST:  OMNIPAQUE IOHEXOL 350 MG/ML SOLN COMPARISON:  02/27/2023 CT head FINDINGS: CT HEAD FINDINGS Brain: No evidence of acute infarction, hemorrhage, mass, mass effect, or midline shift. No hydrocephalus or extra-axial collection. 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 Partial empty sella. Normal craniocervical junction. Periventricular white matter changes, likely the sequela of chronic small vessel ischemic disease. Vascular: No hyperdense vessel. Skull: Negative for fracture or focal lesion. Sinuses/Orbits: No acute finding. Other: The mastoid air cells are well aerated. ASPECTS [facility] Stroke Program Early CT Score) - Ganglionic  level infarction (caudate, lentiform nuclei, internal capsule, insula, M1-M3 cortex): 7 - Supraganglionic infarction (M4-M6 cortex): 3 Total score (0-10 with 10 being normal): 10 CTA NECK FINDINGS Aortic arch: Standard branching. Imaged portion shows no evidence of aneurysm or dissection. 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 Aortic atherosclerosis. Multifocal stenosis in  the right subclavian artery is not hemodynamically significant (series 6, image 280). Right carotid system: No evidence of dissection, occlusion, or hemodynamically significant stenosis (greater than 50%). Atherosclerotic disease in the common carotid, at the bifurcation, and in the proximal ICA is not hemodynamically significant. Left carotid system: No evidence of dissection, occlusion, or hemodynamically significant stenosis (greater than 50%). Atherosclerotic disease in the common carotid, at the bifurcation, and in the proximal ICA is not hemodynamically significant. Vertebral arteries: The left vertebral artery is poorly opacified at its origin and in the left V1 and proximal V2 segments, with improved opacification distally, likely reflecting retrograde flow. The right vertebral artery is patent from its origin to the skull base, without significant stenosis or evidence of dissection. Skeleton: No acute osseous abnormality. Degenerative changes in the cervical spine. Other neck: No acute finding. Upper chest: No focal pulmonary opacity or pleural effusion. Emphysema. Review of the MIP images confirms the above findings CTA HEAD FINDINGS Anterior circulation: Both internal carotid arteries are patent to the termini, with severe stenosis in the distal left cavernous ICA (series 6, image 119), and moderate stenosis in the right cavernous and bilateral supraclinoid ICA. A1 segments patent, hypoplastic on the left. Normal anterior communicating artery. Azygous A2. Anterior cerebral arteries are patent to their distal aspects without significant stenosis. No M1 stenosis or occlusion. MCA branches perfused to their distal aspects without significant stenosis. Posterior circulation: Vertebral arteries patent to the vertebrobasilar junction with moderate stenosis in the proximal left V4 (series 6, image 147) and severe stenosis near the vertebrobasilar junction (series 6, image 130 posterior inferior cerebellar  arteries patent proximally. Basilar patent to its distal aspect without significant stenosis. Superior cerebellar arteries patent proximally. Patent P1 segments. PCAs perfused to their distal aspects without significant stenosis. The bilateral posterior communicating arteries are patent. Venous sinuses: As permitted by contrast timing, patent. Anatomic variants: None significant. No evidence of aneurysm or vascular malformation. Review of the MIP images confirms the above findings CT Brain Perfusion Findings: ASPECTS: 10 CBF (<30%) Volume: 0mL Perfusion (Tmax>6.0s) volume: 0mL Mismatch Volume: 0mL Infarction Location:None IMPRESSION: 1. No acute intracranial process. ASPECTS is 10. 2. No intracranial large vessel occlusion. 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 Moderate stenosis right cavernous and bilateral supraclinoid ICA and more proximal left V4 3. 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 The left vertebral artery is poorly opacified at its origin and in the left V1 and proximal V2 segments, with improved opacification distally, likely reflecting retrograde flow to the left subclavian this territory. 4. No hemodynamically significant stenosis in the neck. 5. No evidence of infarct core or penumbra on CT perfusion. 6. Aortic Atherosclerosis (ICD10-I70.0) and Emphysema (ICD10-J43.9). Code stroke imaging results were communicated on 07/01/2023 at 12:19 pm to provider Eloise Harman via telephone, who verbally acknowledged these results. Electronically Signed   By: Wiliam Ke M.D.   On:  07/01/2023 12:33   CT ANGIO HEAD NECK W WO CM W PERF (CODE STROKE)  Result Date: 07/01/2023 CLINICAL DATA:  Right gaze deviation, altered mental status, stroke suspected EXAM: CT ANGIOGRAPHY HEAD AND NECK CT PERFUSION BRAIN TECHNIQUE: Multidetector CT imaging of the head and neck was performed using the standard  protocol during bolus administration of intravenous contrast. Multiplanar CT image reconstructions and MIPs were obtained to evaluate the vascular anatomy. Carotid stenosis measurements (when applicable) are obtained utilizing NASCET criteria, using the distal internal carotid diameter as the denominator. Multiphase CT imaging of the brain was performed following IV bolus contrast injection. Subsequent parametric perfusion maps were calculated using RAPID software. RADIATION DOSE REDUCTION: This exam was performed according to the departmental dose-optimization program which includes automated exposure control, adjustment of the mA and/or kV according to patient size and/or use of iterative reconstruction technique. CONTRAST:  OMNIPAQUE IOHEXOL 350 MG/ML SOLN COMPARISON:  02/27/2023 CT head FINDINGS: CT HEAD FINDINGS Brain: No evidence of acute infarction, hemorrhage, mass, mass effect, or midline shift. No hydrocephalus or extra-axial collection. 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 Partial empty sella. Normal craniocervical junction. Periventricular white matter changes, likely the sequela of chronic small vessel ischemic disease. Vascular: No hyperdense vessel. Skull: Negative for fracture or focal lesion. Sinuses/Orbits: No acute finding. Other: The mastoid air cells are well aerated. ASPECTS [facility] Stroke Program Early CT Score) - Ganglionic level infarction (caudate, lentiform nuclei, internal capsule, insula, M1-M3 cortex): 7 - Supraganglionic infarction (M4-M6 cortex): 3 Total score (0-10 with 10 being normal): 10 CTA NECK FINDINGS Aortic arch: Standard branching. Imaged portion shows no evidence of aneurysm or dissection. 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 Aortic atherosclerosis. Multifocal stenosis in the right subclavian artery is not hemodynamically significant (series 6, image 280). Right carotid system: No evidence of dissection, occlusion, or hemodynamically significant stenosis (greater than 50%). Atherosclerotic disease in the common carotid, at the bifurcation, and in the proximal ICA is not hemodynamically significant. Left carotid system: No evidence of dissection, occlusion, or hemodynamically significant stenosis (greater than 50%). Atherosclerotic disease in the common carotid, at the bifurcation, and in the proximal ICA is not hemodynamically significant. Vertebral arteries: The left vertebral artery is poorly opacified at its origin and in the left V1 and proximal V2 segments, with improved opacification distally, likely reflecting retrograde flow. The right vertebral artery is patent from its origin to the skull base, without significant stenosis or evidence of dissection. Skeleton: No acute osseous abnormality. Degenerative changes in the cervical spine. Other neck: No acute finding. Upper chest: No focal pulmonary opacity or pleural effusion. Emphysema. Review of the MIP images confirms the above findings CTA HEAD FINDINGS Anterior circulation: Both internal carotid arteries are patent to the termini, with severe stenosis in the distal left cavernous ICA (series 6, image 119), and moderate stenosis in the right cavernous and bilateral supraclinoid ICA. A1 segments patent, hypoplastic on the left. Normal anterior communicating artery. Azygous A2. Anterior cerebral arteries are patent to their distal aspects without significant stenosis. No M1 stenosis or occlusion. MCA branches perfused to their distal aspects without significant stenosis. Posterior circulation: Vertebral arteries patent to the vertebrobasilar junction with moderate stenosis in the proximal left V4 (series 6, image  147)  and severe stenosis near the vertebrobasilar junction (series 6, image 130 posterior inferior cerebellar arteries patent proximally. Basilar patent to its distal aspect without significant stenosis. Superior cerebellar arteries patent proximally. Patent P1 segments. PCAs perfused to their distal aspects without significant stenosis. The bilateral posterior communicating arteries are patent. Venous sinuses: As permitted by contrast timing, patent. Anatomic variants: None significant. No evidence of aneurysm or vascular malformation. Review of the MIP images confirms the above findings CT Brain Perfusion Findings: ASPECTS: 10 CBF (<30%) Volume: 0mL Perfusion (Tmax>6.0s) volume: 0mL Mismatch Volume: 0mL Infarction Location:None IMPRESSION: 1. No acute intracranial process. ASPECTS is 10. 2. No intracranial large vessel occlusion. 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 Moderate stenosis right cavernous and bilateral supraclinoid ICA and more proximal left V4 3. 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 The left vertebral artery is poorly opacified at its origin and in the left V1 and proximal V2 segments, with improved opacification distally, likely reflecting retrograde flow to the left subclavian this territory. 4. No hemodynamically significant stenosis in the neck. 5. No evidence of infarct core or penumbra on CT perfusion. 6. Aortic Atherosclerosis (ICD10-I70.0) and Emphysema (ICD10-J43.9). Code stroke imaging results were communicated on 07/01/2023 at 12:19 pm to provider Eloise Harman via telephone, who verbally acknowledged these results. Electronically Signed   By: Wiliam Ke M.D.   On: 07/01/2023 12:33     Subjective: Patient seen examined bedside, lying in bed.  Remains oriented to self.  Otherwise pleasantly confused.  Discharging back to SNF today.  Denies pain, no shortness of breath, no  fever, no chest pain.  Unable to obtain further ROS given his underlying dementia.  No acute events overnight per nursing staff.  Discharge Exam: Vitals:   07/05/23 0404 07/05/23 0834  BP: 133/72 (!) 146/74  Pulse: 98 (!) 103  Resp: 17 18  Temp: 98.8 F (37.1 C) 98.4 F (36.9 C)  SpO2: 92% 97%   Vitals:   07/04/23 2334 07/05/23 0404 07/05/23 0536 07/05/23 0834  BP: (!) 140/90 133/72  (!) 146/74  Pulse: (!) 102 98  (!) 103  Resp: 18 17  18   Temp: 98.8 F (37.1 C) 98.8 F (37.1 C)  98.4 F (36.9 C)  TempSrc: Oral Oral  Oral  SpO2: 91% 92%  97%  Weight:   59.2 kg   Height:        Physical Exam: GEN: NAD, alert and oriented to self, chronically ill/elderly in appearance, appears older than stated age HEENT: NCAT, PERRL, EOMI, sclera clear, MMM PULM: Breath sounds slight diminished bilateral bases, no wheezing/crackles, normal respiratory effort, without accessory muscle use, on room air CV: RRR w/o M/G/R GI: abd soft, NTND, NABS, no R/G/M MSK: no peripheral edema, moves all extremities independently NEURO: CN II-XII intact, + weakness left UE/LE greater than right which is his baseline PSYCH: Depressed mood, flat affect Integumentary: No concerning rashes/lesions/wounds noted on exposed skin surfaces    The results of significant diagnostics from this hospitalization (including imaging, microbiology, ancillary and laboratory) are listed below for reference.     Microbiology: Recent Results (from the past 240 hour(s))  Urine Culture (for pregnant, neutropenic or urologic patients or patients with an indwelling urinary catheter)     Status: Abnormal   Collection Time: 07/02/23 11:27 AM   Specimen: Urine, Clean Catch  Result Value Ref Range Status   Specimen Description URINE, CLEAN  CATCH  Final   Special Requests   Final    NONE Performed at [facility], [address], [place], [place] [address]    Culture (A)  Final    20,000 COLONIES/mL MULTIPLE SPECIES  PRESENT, SUGGEST RECOLLECTION   Report Status 07/04/2023 FINAL  Final  Culture, blood (Routine X 2) w Reflex to ID Panel     Status: None (Preliminary result)   Collection Time: 07/02/23  2:55 PM   Specimen: BLOOD  Result Value Ref Range Status   Specimen Description BLOOD BLOOD LEFT HAND  Final   Special Requests   Final    BOTTLES DRAWN AEROBIC AND ANAEROBIC Blood Culture results may not be optimal due to an inadequate volume of blood received in culture bottles   Culture   Final    NO GROWTH 3 DAYS Performed at [facility], [address] [place], [place] [address]    Report Status PENDING  Incomplete  Culture, blood (Routine X 2) w Reflex to ID Panel     Status: None (Preliminary result)   Collection Time: 07/02/23  2:55 PM   Specimen: BLOOD  Result Value Ref Range Status   Specimen Description BLOOD BLOOD RIGHT HAND  Final   Special Requests   Final    BOTTLES DRAWN AEROBIC AND ANAEROBIC Blood Culture results may not be optimal due to an inadequate volume of blood received in culture bottles   Culture   Final    NO GROWTH 3 DAYS Performed at [facility], [address] [place], [place] [address]    Report Status PENDING  Incomplete     Labs: BNP (last 3 results) Recent Labs    10/08/22 0434 10/09/22 0445 10/10/22 0423  BNP 446.0* 214.5* 183.6*   Basic Metabolic Panel: Recent Labs  Lab 07/01/23 1236 07/02/23 0449 07/03/23 0641 07/04/23 0616 07/05/23 0457  NA 138 139 144 148* 149*  K 5.1 3.9 3.9 3.7 3.5  CL 104 109 113* 115* 115*  CO2 23 21* 21* 22 23  GLUCOSE 137* 134* 114* 145* 132*  BUN 23 18 22  28* 27*  CREATININE 1.23 1.01 1.13 1.17 1.26*  CALCIUM 8.7* 9.0 9.1 9.0 8.9  MG 1.9  --  1.7  --   --   PHOS 5.2*  --  2.0*  --   --    Liver Function Tests: Recent Labs  Lab 07/01/23 1236  AST 53*  ALT 25  ALKPHOS 79  BILITOT 0.5  PROT 7.1  ALBUMIN 3.5   No results for input(s): "LIPASE", "AMYLASE" in the last 168 hours. No results for  input(s): "AMMONIA" in the last 168 hours. CBC: Recent Labs  Lab 07/01/23 1236 07/02/23 0449 07/03/23 0641 07/04/23 0616 07/05/23 0457  WBC 20.7* 18.5* 16.7* 15.1* 14.5*  NEUTROABS 16.3*  --   --   --   --   HGB 13.6 12.8* 13.0 12.5* 11.4*  HCT 44.1 38.5* 39.5 38.7* 35.1*  MCV 94.8 89.1 89.4 91.1 90.7  PLT 263 228 215 232 223   Cardiac Enzymes: No results for input(s): "CKTOTAL", "CKMB", "CKMBINDEX", "TROPONINI" in the last 168 hours. BNP: Invalid input(s): "POCBNP" CBG: Recent Labs  Lab 07/04/23 0627 07/04/23 1117 07/04/23 1632 07/04/23 2118 07/05/23 0608  GLUCAP 176* 203* 142* 265* 124*   D-Dimer No results for input(s): "DDIMER" in the last 72 hours. Hgb A1c No results for input(s): "HGBA1C" in the last 72 hours. Lipid Profile No results for input(s): "CHOL", "  HDL", "LDLCALC", "TRIG", "CHOLHDL", "LDLDIRECT" in the last 72 hours. Thyroid function studies No results for input(s): "TSH", "T4TOTAL", "T3FREE", "THYROIDAB" in the last 72 hours.  Invalid input(s): "FREET3" Anemia work up No results for input(s): "VITAMINB12", "FOLATE", "FERRITIN", "TIBC", "IRON", "RETICCTPCT" in the last 72 hours. Urinalysis    Component Value Date/Time   COLORURINE YELLOW 07/01/2023 1127   APPEARANCEUR HAZY (A) 07/01/2023 1127   LABSPEC >1.046 (H) 07/01/2023 1127   PHURINE 7.0 07/01/2023 1127   GLUCOSEU NEGATIVE 07/01/2023 1127   HGBUR SMALL (A) 07/01/2023 1127   BILIRUBINUR NEGATIVE 07/01/2023 1127   KETONESUR NEGATIVE 07/01/2023 1127   PROTEINUR 100 (A) 07/01/2023 1127   NITRITE NEGATIVE 07/01/2023 1127   LEUKOCYTESUR LARGE (A) 07/01/2023 1127   Sepsis Labs Recent Labs  Lab 07/02/23 0449 07/03/23 0641 07/04/23 0616 07/05/23 0457  WBC 18.5* 16.7* 15.1* 14.5*   Microbiology Recent Results (from the past 240 hour(s))  Urine Culture (for pregnant, neutropenic or urologic patients or patients with an indwelling urinary catheter)     Status: Abnormal   Collection Time:  07/02/23 11:27 AM   Specimen: Urine, Clean Catch  Result Value Ref Range Status   Specimen Description URINE, CLEAN CATCH  Final   Special Requests   Final    NONE Performed at [facility], [address], [place], [place] [address]    Culture (A)  Final    20,000 COLONIES/mL MULTIPLE SPECIES PRESENT, SUGGEST RECOLLECTION   Report Status 07/04/2023 FINAL  Final  Culture, blood (Routine X 2) w Reflex to ID Panel     Status: None (Preliminary result)   Collection Time: 07/02/23  2:55 PM   Specimen: BLOOD  Result Value Ref Range Status   Specimen Description BLOOD BLOOD LEFT HAND  Final   Special Requests   Final    BOTTLES DRAWN AEROBIC AND ANAEROBIC Blood Culture results may not be optimal due to an inadequate volume of blood received in culture bottles   Culture   Final    NO GROWTH 3 DAYS Performed at [facility], [address] [place], [place] [address]    Report Status PENDING  Incomplete  Culture, blood (Routine X 2) w Reflex to ID Panel     Status: None (Preliminary result)   Collection Time: 07/02/23  2:55 PM   Specimen: BLOOD  Result Value Ref Range Status   Specimen Description BLOOD BLOOD RIGHT HAND  Final   Special Requests   Final    BOTTLES DRAWN AEROBIC AND ANAEROBIC Blood Culture results may not be optimal due to an inadequate volume of blood received in culture bottles   Culture   Final    NO GROWTH 3 DAYS Performed at [facility], [address] [place], [place] [address]    Report Status PENDING  Incomplete     Time coordinating discharge: Over 30 minutes  SIGNED:   Alvira Philips [place], DO  [facility] 07/05/2023, 11:41 AM

## 2023-07-05 NOTE — Plan of Care (Signed)
  Problem: Clinical Measurements: Goal: Will remain free from infection Outcome: Progressing Goal: Diagnostic test results will improve Outcome: Progressing Goal: Respiratory complications will improve Outcome: Progressing Goal: Cardiovascular complication will be avoided Outcome: Progressing   Problem: Nutrition: Goal: Adequate nutrition will be maintained Outcome: Progressing   Problem: Coping: Goal: Level of anxiety will decrease Outcome: Progressing   Problem: Elimination: Goal: Will not experience complications related to bowel motility Outcome: Progressing Goal: Will not experience complications related to urinary retention Outcome: Progressing   Problem: Pain Management: Goal: General experience of comfort will improve Outcome: Progressing   Problem: Safety: Goal: Ability to remain free from injury will improve Outcome: Progressing   Problem: Skin Integrity: Goal: Risk for impaired skin integrity will decrease Outcome: Progressing   Problem: Metabolic: Goal: Ability to maintain appropriate glucose levels will improve Outcome: Progressing   Problem: Nutritional: Goal: Maintenance of adequate nutrition will improve Outcome: Progressing   Problem: Skin Integrity: Goal: Risk for impaired skin integrity will decrease Outcome: Progressing   Problem: Tissue Perfusion: Goal: Adequacy of tissue perfusion will improve Outcome: Progressing   Problem: Nutrition: Goal: Risk of aspiration will decrease Outcome: Progressing Goal: Dietary intake will improve Outcome: Progressing

## 2023-07-07 LAB — CULTURE, BLOOD (ROUTINE X 2)
Culture: NO GROWTH
Culture: NO GROWTH

## 2023-07-28 ENCOUNTER — Other Ambulatory Visit: Payer: Self-pay

## 2023-07-28 ENCOUNTER — Inpatient Hospital Stay (HOSPITAL_COMMUNITY)
Admission: EM | Admit: 2023-07-28 | Discharge: 2023-08-04 | DRG: 100 | Disposition: A | Discharge status: Skilled Nursing Facility | Payer: Medicare Other | Source: Skilled Nursing Facility | Attending: Internal Medicine | Admitting: Internal Medicine

## 2023-07-28 ENCOUNTER — Emergency Department (HOSPITAL_COMMUNITY): Payer: Medicare Other

## 2023-07-28 ENCOUNTER — Encounter (HOSPITAL_COMMUNITY): Payer: Self-pay

## 2023-07-28 DIAGNOSIS — Z87440 Personal history of urinary (tract) infections: Secondary | ICD-10-CM

## 2023-07-28 DIAGNOSIS — Z1152 Encounter for screening for COVID-19: Secondary | ICD-10-CM

## 2023-07-28 DIAGNOSIS — J9600 Acute respiratory failure, unspecified whether with hypoxia or hypercapnia: Secondary | ICD-10-CM | POA: Diagnosis present

## 2023-07-28 DIAGNOSIS — R627 Adult failure to thrive: Secondary | ICD-10-CM | POA: Diagnosis present

## 2023-07-28 DIAGNOSIS — Z681 Body mass index (BMI) 19 or less, adult: Secondary | ICD-10-CM

## 2023-07-28 DIAGNOSIS — N400 Enlarged prostate without lower urinary tract symptoms: Secondary | ICD-10-CM | POA: Diagnosis present

## 2023-07-28 DIAGNOSIS — E43 Unspecified severe protein-calorie malnutrition: Secondary | ICD-10-CM | POA: Diagnosis present

## 2023-07-28 DIAGNOSIS — Z79899 Other long term (current) drug therapy: Secondary | ICD-10-CM

## 2023-07-28 DIAGNOSIS — F17210 Nicotine dependence, cigarettes, uncomplicated: Secondary | ICD-10-CM | POA: Diagnosis present

## 2023-07-28 DIAGNOSIS — G40901 Epilepsy, unspecified, not intractable, with status epilepticus: Principal | ICD-10-CM | POA: Diagnosis present

## 2023-07-28 DIAGNOSIS — Z7902 Long term (current) use of antithrombotics/antiplatelets: Secondary | ICD-10-CM

## 2023-07-28 DIAGNOSIS — B9562 Methicillin resistant Staphylococcus aureus infection as the cause of diseases classified elsewhere: Secondary | ICD-10-CM | POA: Diagnosis present

## 2023-07-28 DIAGNOSIS — E872 Acidosis, unspecified: Secondary | ICD-10-CM | POA: Diagnosis present

## 2023-07-28 DIAGNOSIS — I2510 Atherosclerotic heart disease of native coronary artery without angina pectoris: Secondary | ICD-10-CM | POA: Diagnosis present

## 2023-07-28 DIAGNOSIS — I69354 Hemiplegia and hemiparesis following cerebral infarction affecting left non-dominant side: Secondary | ICD-10-CM

## 2023-07-28 DIAGNOSIS — Z880 Allergy status to penicillin: Secondary | ICD-10-CM

## 2023-07-28 DIAGNOSIS — Z881 Allergy status to other antibiotic agents status: Secondary | ICD-10-CM

## 2023-07-28 DIAGNOSIS — Z7982 Long term (current) use of aspirin: Secondary | ICD-10-CM

## 2023-07-28 DIAGNOSIS — I959 Hypotension, unspecified: Secondary | ICD-10-CM | POA: Diagnosis present

## 2023-07-28 DIAGNOSIS — E119 Type 2 diabetes mellitus without complications: Secondary | ICD-10-CM

## 2023-07-28 DIAGNOSIS — J189 Pneumonia, unspecified organism: Secondary | ICD-10-CM

## 2023-07-28 DIAGNOSIS — J690 Pneumonitis due to inhalation of food and vomit: Secondary | ICD-10-CM | POA: Diagnosis present

## 2023-07-28 DIAGNOSIS — G9341 Metabolic encephalopathy: Secondary | ICD-10-CM

## 2023-07-28 DIAGNOSIS — F0390 Unspecified dementia without behavioral disturbance: Secondary | ICD-10-CM

## 2023-07-28 DIAGNOSIS — F0393 Unspecified dementia, unspecified severity, with mood disturbance: Secondary | ICD-10-CM | POA: Diagnosis present

## 2023-07-28 DIAGNOSIS — E785 Hyperlipidemia, unspecified: Secondary | ICD-10-CM | POA: Diagnosis present

## 2023-07-28 DIAGNOSIS — I10 Essential (primary) hypertension: Secondary | ICD-10-CM

## 2023-07-28 DIAGNOSIS — F32A Depression, unspecified: Secondary | ICD-10-CM | POA: Diagnosis present

## 2023-07-28 LAB — CBC WITH DIFFERENTIAL/PLATELET
Abs Immature Granulocytes: 0.11 10*3/uL — ABNORMAL HIGH (ref 0.00–0.07)
Basophils Absolute: 0.1 10*3/uL (ref 0.0–0.1)
Basophils Relative: 0 %
Eosinophils Absolute: 0.4 10*3/uL (ref 0.0–0.5)
Eosinophils Relative: 2 %
HCT: 41.2 % (ref 39.0–52.0)
Hemoglobin: 12.7 g/dL — ABNORMAL LOW (ref 13.0–17.0)
Immature Granulocytes: 1 %
Lymphocytes Relative: 21 %
Lymphs Abs: 4.4 10*3/uL — ABNORMAL HIGH (ref 0.7–4.0)
MCH: 30 pg (ref 26.0–34.0)
MCHC: 30.8 g/dL (ref 30.0–36.0)
MCV: 97.4 fL (ref 80.0–100.0)
Monocytes Absolute: 1.5 10*3/uL — ABNORMAL HIGH (ref 0.1–1.0)
Monocytes Relative: 7 %
Neutro Abs: 14.7 10*3/uL — ABNORMAL HIGH (ref 1.7–7.7)
Neutrophils Relative %: 69 %
Platelets: 277 10*3/uL (ref 150–400)
RBC: 4.23 MIL/uL (ref 4.22–5.81)
RDW: 13.5 % (ref 11.5–15.5)
WBC: 21.2 10*3/uL — ABNORMAL HIGH (ref 4.0–10.5)
nRBC: 0 % (ref 0.0–0.2)

## 2023-07-28 LAB — COMPREHENSIVE METABOLIC PANEL
ALT: 21 U/L (ref 0–44)
AST: 27 U/L (ref 15–41)
Albumin: 3.8 g/dL (ref 3.5–5.0)
Alkaline Phosphatase: 92 U/L (ref 38–126)
Anion gap: 18 — ABNORMAL HIGH (ref 5–15)
BUN: 17 mg/dL (ref 8–23)
CO2: 14 mmol/L — ABNORMAL LOW (ref 22–32)
Calcium: 9 mg/dL (ref 8.9–10.3)
Chloride: 110 mmol/L (ref 98–111)
Creatinine, Ser: 1.29 mg/dL — ABNORMAL HIGH (ref 0.61–1.24)
GFR, Estimated: >60 mL/min (ref >60)
Glucose, Bld: 151 mg/dL — ABNORMAL HIGH (ref 70–99)
Potassium: 4.3 mmol/L (ref 3.5–5.1)
Sodium: 142 mmol/L (ref 135–145)
Total Bilirubin: 0.6 mg/dL (ref 0.0–1.2)
Total Protein: 7.9 g/dL (ref 6.5–8.1)

## 2023-07-28 LAB — BLOOD GAS, VENOUS
Acid-base deficit: 7 mmol/L — ABNORMAL HIGH (ref 0.0–2.0)
Bicarbonate: 20.9 mmol/L (ref 20.0–28.0)
Drawn by: 66297
O2 Saturation: 96.1 %
Patient temperature: 37.1
pCO2, Ven: 51 mm[Hg] (ref 44–60)
pH, Ven: 7.22 — ABNORMAL LOW (ref 7.25–7.43)
pO2, Ven: 85 mm[Hg] — ABNORMAL HIGH (ref 32–45)

## 2023-07-28 LAB — BLOOD GAS, ARTERIAL
Acid-Base Excess: 1.3 mmol/L (ref 0.0–2.0)
Bicarbonate: 24.9 mmol/L (ref 20.0–28.0)
Drawn by: 38235
FIO2: 100 %
O2 Saturation: 99.8 %
Patient temperature: 37.2
pCO2 arterial: 35 mm[Hg] (ref 32–48)
pH, Arterial: 7.46 — ABNORMAL HIGH (ref 7.35–7.45)
pO2, Arterial: 331 mm[Hg] — ABNORMAL HIGH (ref 83–108)

## 2023-07-28 LAB — RESP PANEL BY RT-PCR (RSV, FLU A&B, COVID)  RVPGX2
Influenza A by PCR: NEGATIVE
Influenza B by PCR: NEGATIVE
Resp Syncytial Virus by PCR: NEGATIVE
SARS Coronavirus 2 by RT PCR: NEGATIVE

## 2023-07-28 LAB — LACTIC ACID, PLASMA
Lactic Acid, Venous: >9 mmol/L (ref 0.5–1.9)
Lactic Acid, Venous: 2.4 mmol/L (ref 0.5–1.9)

## 2023-07-28 LAB — MAGNESIUM: Magnesium: 1.7 mg/dL (ref 1.7–2.4)

## 2023-07-28 LAB — CBG MONITORING, ED: Glucose-Capillary: 133 mg/dL — ABNORMAL HIGH (ref 70–99)

## 2023-07-28 MED ORDER — LEVETIRACETAM IN NACL 1000 MG/100ML IV SOLN
1000.0000 mg | Freq: Once | INTRAVENOUS | Status: AC
  Administered 2023-07-28: 1000 mg via INTRAVENOUS
  Filled 2023-07-28 (×2): qty 100

## 2023-07-28 MED ORDER — KETAMINE HCL 50 MG/5ML IJ SOSY
PREFILLED_SYRINGE | INTRAMUSCULAR | Status: AC
  Filled 2023-07-28: qty 10

## 2023-07-28 MED ORDER — LEVETIRACETAM IN NACL 500 MG/100ML IV SOLN
500.0000 mg | Freq: Once | INTRAVENOUS | Status: AC
  Administered 2023-07-28: 500 mg via INTRAVENOUS
  Filled 2023-07-28: qty 100

## 2023-07-28 MED ORDER — MIDAZOLAM HCL 2 MG/2ML IJ SOLN
INTRAMUSCULAR | Status: AC
  Filled 2023-07-28: qty 2

## 2023-07-28 MED ORDER — LORAZEPAM 2 MG/ML IJ SOLN: 1.0000 mg | Freq: Once | INTRAMUSCULAR | Status: AC

## 2023-07-28 MED ORDER — SUCCINYLCHOLINE CHLORIDE 200 MG/10ML IV SOSY
PREFILLED_SYRINGE | INTRAVENOUS | Status: AC
  Administered 2023-07-28: 100 mg via INTRAVENOUS
  Filled 2023-07-28: qty 10

## 2023-07-28 MED ORDER — LORAZEPAM 2 MG/ML IJ SOLN
INTRAMUSCULAR | Status: AC
  Administered 2023-07-28: 1 mg via INTRAVENOUS
  Filled 2023-07-28: qty 1

## 2023-07-28 MED ORDER — PROPOFOL 1000 MG/100ML IV EMUL
5.0000 ug/kg/min | INTRAVENOUS | Status: DC
  Administered 2023-07-28: 10 ug/kg/min via INTRAVENOUS
  Filled 2023-07-28: qty 100

## 2023-07-28 MED ORDER — ROCURONIUM BROMIDE 10 MG/ML (PF) SYRINGE
PREFILLED_SYRINGE | INTRAVENOUS | Status: AC
  Filled 2023-07-28: qty 10

## 2023-07-28 MED ORDER — SODIUM CHLORIDE 0.9 % IV SOLN: INTRAVENOUS | Status: DC

## 2023-07-28 MED ORDER — CEFTRIAXONE SODIUM 2 G IJ SOLR
2.0000 g | Freq: Once | INTRAMUSCULAR | Status: AC
  Administered 2023-07-28: 2 g via INTRAVENOUS
  Filled 2023-07-28: qty 20

## 2023-07-28 MED ORDER — VANCOMYCIN HCL IN DEXTROSE 1-5 GM/200ML-% IV SOLN
1000.0000 mg | Freq: Once | INTRAVENOUS | Status: AC
  Administered 2023-07-28: 1000 mg via INTRAVENOUS
  Filled 2023-07-28: qty 200

## 2023-07-28 MED ORDER — FENTANYL CITRATE PF 50 MCG/ML IJ SOSY
50.0000 ug | PREFILLED_SYRINGE | Freq: Once | INTRAMUSCULAR | Status: AC
  Administered 2023-07-29: 50 ug via INTRAVENOUS
  Filled 2023-07-28: qty 1

## 2023-07-28 MED ORDER — LORAZEPAM 2 MG/ML IJ SOLN
1.0000 mg | Freq: Once | INTRAMUSCULAR | Status: AC
  Administered 2023-07-28: 1 mg via INTRAVENOUS
  Filled 2023-07-28: qty 1

## 2023-07-28 MED ORDER — FENTANYL CITRATE PF 50 MCG/ML IJ SOSY: 50.0000 ug | PREFILLED_SYRINGE | Freq: Once | INTRAMUSCULAR | Status: DC

## 2023-07-28 MED ORDER — FENTANYL CITRATE (PF) 100 MCG/2ML IJ SOLN
INTRAMUSCULAR | Status: AC
  Filled 2023-07-28: qty 2

## 2023-07-28 MED ORDER — ETOMIDATE 2 MG/ML IV SOLN: 20.0000 mg | Freq: Once | INTRAVENOUS | Status: AC

## 2023-07-28 MED ORDER — FENTANYL CITRATE PF 50 MCG/ML IJ SOSY
PREFILLED_SYRINGE | INTRAMUSCULAR | Status: AC
  Filled 2023-07-28: qty 1

## 2023-07-28 MED ORDER — SUCCINYLCHOLINE CHLORIDE 200 MG/10ML IV SOSY: 100.0000 mg | PREFILLED_SYRINGE | Freq: Once | INTRAVENOUS | Status: AC

## 2023-07-28 MED ORDER — LORAZEPAM 2 MG/ML IJ SOLN
1.0000 mg | Freq: Once | INTRAMUSCULAR | Status: AC
  Administered 2023-07-28: 1 mg via INTRAVENOUS

## 2023-07-28 MED ORDER — ETOMIDATE 2 MG/ML IV SOLN
INTRAVENOUS | Status: AC
  Administered 2023-07-28: 20 mg via INTRAVENOUS
  Filled 2023-07-28: qty 20

## 2023-07-28 MED ORDER — FENTANYL CITRATE PF 50 MCG/ML IJ SOSY
PREFILLED_SYRINGE | INTRAMUSCULAR | Status: AC
  Filled 2023-07-28: qty 2

## 2023-07-28 NOTE — ED Triage Notes (Signed)
 Pt BIB ems for "muscle spasms." Per EMS pt was verbal upon arrival and pt became unresponsive while waiting for  a room. Upon assessment pt having active seizures.

## 2023-07-28 NOTE — ED Notes (Signed)
 Portable xray at bedside.

## 2023-07-28 NOTE — ED Provider Notes (Signed)
 Jacob Mcgrath Provider Note   CSN: [id number] Arrival date & time: 07/28/23  8161     History  Chief Complaint  Patient presents with   Seizures    Jacob Mcgrath. is a 68 y.o. male.  Patient brought over from [place].  They said he was talking shortly prior to arrival and stopped talking.  The patient seemed to be in status epilepticus.  Patient with a lot of jerking mostly on his right side.  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  Chart review confirmed that that he had a right MCA and is known to have left-sided weakness.  Eyes were deviated to the left.  Patient was completely unresponsive.  Chart shows that patient is to be on Keppra  500 mg twice a day.  Recent hospitalization review when patient was admitted December 6 through December 10 and he was transferred down to [facility] for EEG.  Showed that they had recommended DNR status and may have even hospice care for him.  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  Patient was discharged from that hospitalization back to [facility].  Main reason for that admission was was altered mental status.  Son acute metabolic encephalopathy which resolved history of the right ICA CVA left-sided weakness evidence of some seizure activity on EEG.  Community-acquired pneumonia urinary tract infection hyper tension hyperlipidemia type 2 diabetes and dementia.  They did consult palliative care for that during his hospitalization.  Patient's seizures seem to persist.  We initially gave him 1 mg of Ativan  seem to slow him down but did not resolve them.  Gave him 1000 mg bolus of Keppra  IV.  Got another dose of Ativan  so a total of 2 mg and got another 500 of Keppra  IV and this seemed to stop the seizures.  Still eyes were deviating to the left.  Patient had no gag.  So we have prepared for intubation.       Home  Medications Prior to Admission medications   Medication Sig Start Date End Date Taking? Authorizing Provider  acetaminophen  (TYLENOL ) 325 MG tablet Take 325 mg by mouth every 6 (six) hours as needed for mild pain (pain score 1-3).    [provider]  aspirin  EC 81 MG tablet Take 81 mg by mouth daily. Swallow whole.    [provider]  atorvastatin  (LIPITOR ) 80 MG tablet Take 80 mg by mouth at bedtime. 08/14/21   [provider]  Baclofen  5 MG TABS Take 1 tablet by mouth in the morning, at noon, and at bedtime. 06/16/23   [provider]  clopidogrel  (PLAVIX ) 75 MG tablet Take 1 tablet (75 mg total) by mouth daily with breakfast. 10/11/22   Dennise Lavada POUR, MD  DULoxetine  (CYMBALTA ) 30 MG capsule Take 30 mg by mouth daily. 07/23/22   [provider]  guaiFENesin (MUCINEX MAXIMUM STRENGTH PO) Take 600 mg by mouth every 12 (twelve) hours as needed (cough/congestion). 08/10/22   [provider]  levETIRAcetam  (KEPPRA ) 500 MG tablet Take 500 mg by mouth 2 (two) times daily. 07/23/22   [provider]  melatonin 3 MG TABS tablet Take 2 tablets (6 mg total) by mouth at bedtime. 03/03/23   Pearlean Manus, MD  Menthol, Topical Analgesic, (BIOFREEZE EX) Apply 1 Application topically at bedtime.    [provider]  metoprolol  tartrate (LOPRESSOR ) 25 MG tablet Take  1 tablet (25 mg total) by mouth 2 (two) times daily. 10/11/22   Singh, Prashant K, MD  Multiple Vitamin (MULTIVITAMIN) tablet Take 1 tablet by mouth daily.    [provider]  pantoprazole  (PROTONIX ) 40 MG tablet Take 1 tablet (40 mg total) by mouth daily. 10/11/22   Singh, Prashant K, MD  PARoxetine  (PAXIL ) 40 MG tablet Take 40 mg by mouth every morning. 06/26/23   [provider]  tamsulosin  (FLOMAX ) 0.4 MG CAPS capsule Take 1 capsule (0.4 mg total) by mouth daily. 10/11/22   Singh, Prashant K, MD      Allergies    Amoxicillin, Ampicillin, and Tetracyclines &  related    Review of Systems   Review of Systems  Unable to perform ROS: Patient unresponsive    Physical Exam Updated Vital Signs BP (!) 82/58 (BP Location: Right Arm)   Pulse 75   Temp 99 F (37.2 C) (Bladder)   Resp 20   Ht 1.753 m (5' 9)   Wt 59.2 kg   SpO2 100%   BMI 19.27 kg/m  Physical Exam Vitals and nursing note reviewed.  Constitutional:      General: He is in acute distress.     Appearance: He is well-developed. He is diaphoretic.  HENT:     Head: Normocephalic and atraumatic.     Mouth/Throat:     Mouth: Mucous membranes are moist.  Eyes:     Extraocular Movements: Extraocular movements intact.     Conjunctiva/sclera: Conjunctivae normal.     Pupils: Pupils are equal, round, and reactive to light.     Comments: Eyes deviated to the left.  Cardiovascular:     Rate and Rhythm: Regular rhythm. Tachycardia present.     Heart sounds: No murmur heard.    Comments: Heart rate significantly tachycardic heart rate of around 160 could be SVT or just sinus tach. Pulmonary:     Effort: Pulmonary effort is normal. No respiratory distress.     Breath sounds: Normal breath sounds.  Abdominal:     Palpations: Abdomen is soft.     Tenderness: There is no abdominal tenderness.  Musculoskeletal:        General: No swelling.     Cervical back: Normal range of motion and neck supple. No rigidity.     Right lower leg: No edema.     Left lower leg: No edema.  Skin:    General: Skin is warm.     Capillary Refill: Capillary refill takes less than 2 seconds.  Neurological:     Mental Status: He is alert.     Comments: Ongoing seizure activity.  More so right side than left.  Eyes deviated to the left  Psychiatric:        Mood and Affect: Mood normal.     ED Results / Procedures / Treatments   Labs (all labs ordered are listed, but only abnormal results are displayed) Labs Reviewed  CBC WITH DIFFERENTIAL/PLATELET - Abnormal; Notable for the following components:       Result Value   WBC 21.2 (*)    Hemoglobin 12.7 (*)    Neutro Abs 14.7 (*)    Lymphs Abs 4.4 (*)    Monocytes Absolute 1.5 (*)    Abs Immature Granulocytes 0.11 (*)    All other components within normal limits  COMPREHENSIVE METABOLIC PANEL - Abnormal; Notable for the following components:   CO2 14 (*)    Glucose, Bld 151 (*)    Creatinine,  Ser 1.29 (*)    Anion gap 18 (*)    All other components within normal limits  LACTIC ACID, PLASMA - Abnormal; Notable for the following components:   Lactic Acid, Venous >9.0 (*)    All other components within normal limits  LACTIC ACID, PLASMA - Abnormal; Notable for the following components:   Lactic Acid, Venous 2.4 (*)    All other components within normal limits  BLOOD GAS, VENOUS - Abnormal; Notable for the following components:   pH, Ven 7.22 (*)    pO2, Ven 85 (*)    Acid-base deficit 7.0 (*)    All other components within normal limits  BLOOD GAS, ARTERIAL - Abnormal; Notable for the following components:   pH, Arterial 7.46 (*)    pO2, Arterial 331 (*)    All other components within normal limits  CBG MONITORING, ED - Abnormal; Notable for the following components:   Glucose-Capillary 133 (*)    All other components within normal limits  CULTURE, BLOOD (ROUTINE X 2)  CULTURE, BLOOD (ROUTINE X 2)  RESP PANEL BY RT-PCR (RSV, FLU A&B, COVID)  RVPGX2  MAGNESIUM   LEVETIRACETAM  LEVEL    EKG None  Radiology CT Head Wo Contrast Result Date: 07/28/2023 CLINICAL DATA:  Seizure EXAM: CT HEAD WITHOUT CONTRAST TECHNIQUE: Contiguous axial images were obtained from the base of the skull through the vertex without intravenous contrast. RADIATION DOSE REDUCTION: This exam was performed according to the departmental dose-optimization program which includes automated exposure control, adjustment of the mA and/or kV according to patient size and/or use of iterative reconstruction technique. COMPARISON:  MRI 07/01/2023, CT 07/01/2023 FINDINGS:  Brain: No acute territorial infarction, hemorrhage or intracranial mass. Chronic right MCA infarct with encephalomalacia in the right frontal and parietal lobes. Chronic small vessel ischemic changes of the white matter. Stable ventricle size. Vascular: No hyperdense vessels.  Carotid vascular calcification Skull: Normal. Negative for fracture or focal lesion. Sinuses/Orbits: No acute finding. Other: None IMPRESSION: 1. No CT evidence for acute intracranial abnormality. 2. Chronic right MCA infarct. Chronic small vessel ischemic changes of the white matter. Electronically Signed   By: Luke Bun M.D.   On: 07/28/2023 23:26   DG Chest Port 1 View Result Date: 07/28/2023 CLINICAL DATA:  Intubated EXAM: PORTABLE CHEST 1 VIEW COMPARISON:  07/01/2023 FINDINGS: Endotracheal tube tip is about 4.5 cm superior to the carina. Interval airspace disease at the bilateral bases. Normal cardiac size. Aortic atherosclerosis IMPRESSION: 1. Endotracheal tube tip about 4.5 cm superior to the carina. 2. Interval airspace disease at the bilateral bases, atelectasis versus pneumonia versus aspiration Electronically Signed   By: Luke Bun M.D.   On: 07/28/2023 23:22   DG Abd 1 View Result Date: 07/28/2023 CLINICAL DATA:  OG tube placement EXAM: ABDOMEN - 1 VIEW COMPARISON:  07/01/2023 FINDINGS: Esophageal tube tip projects at the level of left diaphragm trauma presumably due to mild elevation of left diaphragm and upward displacement of the stomach. IMPRESSION: Esophageal tube tip projects at the level of left diaphragm, presumably due to mild elevation of left diaphragm and upward displacement of the stomach. Electronically Signed   By: Luke Bun M.D.   On: 07/28/2023 23:20    Procedures Procedure Name: Intubation Date/Time: 07/29/2023 12:30 AM  Performed by: Toron Bowring, MDOxygen Delivery Method: Ambu bag Preoxygenation: Pre-oxygenation with 100% oxygen Induction Type: Rapid sequence Ventilation: Mask  ventilation without difficulty Laryngoscope Size: Glidescope and 3 Tube size: 7.5 mm Number of attempts: 1 Airway Equipment and  Method: Rigid stylet Placement Confirmation: ETT inserted through vocal cords under direct vision, CO2 detector and Breath sounds checked- equal and bilateral Secured at: 26 cm Tube secured with: ETT holder Comments: Patient tolerated and patient without any acute difficulties.  And the patient was very smooth.  Good view of the vocal cords and epiglottis.        Medications Ordered in ED Medications  0.9 %  sodium chloride  infusion ( Intravenous New Bag/Given 07/28/23 2059)  rocuronium  (ZEMURON ) 100 MG/10ML injection (  Not Given 07/28/23 2101)  ketamine  HCl 50 MG/5ML SOSY (  Not Given 07/28/23 2102)  midazolam  (VERSED ) 2 MG/2ML injection (  Not Given 07/28/23 2102)  propofol  (DIPRIVAN ) 1000 MG/100ML infusion (0 mcg/kg/min  59.2 kg Intravenous Paused 07/28/23 2350)  fentaNYL  (SUBLIMAZE ) injection 50 mcg ( Intravenous Not Given 07/28/23 2101)  fentaNYL  (SUBLIMAZE ) 50 MCG/ML injection (  Not Given 07/28/23 2100)  LORazepam  (ATIVAN ) injection 1 mg (1 mg Intravenous Given 07/28/23 1930)  levETIRAcetam  (KEPPRA ) IVPB 1000 mg/100 mL premix (0 mg Intravenous Stopped 07/28/23 1950)  LORazepam  (ATIVAN ) injection 1 mg (1 mg Intravenous Given 07/28/23 1934)  LORazepam  (ATIVAN ) injection 1 mg (1 mg Intravenous Given 07/28/23 2000)  levETIRAcetam  (KEPPRA ) IVPB 500 mg/100 mL premix (0 mg Intravenous Stopped 07/28/23 2057)  cefTRIAXone  (ROCEPHIN ) 2 g in sodium chloride  0.9 % 100 mL IVPB (0 g Intravenous Stopped 07/28/23 2139)  vancomycin  (VANCOCIN ) IVPB 1000 mg/200 mL premix (0 mg Intravenous Stopped 07/28/23 2251)  fentaNYL  (SUBLIMAZE ) 100 MCG/2ML injection (  Given 07/28/23 2100)  LORazepam  (ATIVAN ) injection 1 mg (1 mg Intravenous Given 07/28/23 2039)  etomidate  (AMIDATE ) injection 20 mg (20 mg Intravenous Given 07/28/23 2039)  succinylcholine  (ANECTINE ) syringe 100 mg (100 mg Intravenous Given 07/28/23  2040)  fentaNYL  (SUBLIMAZE ) injection 50 mcg (50 mcg Intravenous Given 07/29/23 0000)  sodium chloride  0.9 % bolus 1,000 mL (1,000 mLs Intravenous New Bag/Given 07/29/23 0000)    ED Course/ Medical Decision Making/ A&P                                 Medical Decision Making Amount and/or Complexity of Data Reviewed Labs: ordered. Radiology: ordered.  Risk Prescription drug management. Decision regarding hospitalization.   CRITICAL CARE Performed by: Ashwini Jago Total critical care time: 60 minutes Critical care time was exclusive of separately billable procedures and treating other patients. Critical care was necessary to treat or prevent imminent or life-threatening deterioration. Critical care was time spent personally by me on the following activities: development of treatment plan with patient and/or surrogate as well as nursing, discussions with consultants, evaluation of patient's response to treatment, examination of patient, obtaining history from patient or surrogate, ordering and performing treatments and interventions, ordering and review of laboratory studies, ordering and review of radiographic studies, pulse oximetry and re-evaluation of patient's condition.  Successfully able to control the seizures as explained with the Keppra  and Ativan  IV.  But eyes were still deviated to the left.  Had no gag.  So we proceeded for intubation.  Following and patient was started on propofol .  Patient's initial lactic acid was greater 9 but repeat was 2.4 as were very reassuring.  Patient's arterial blood gas after intubation pH was 7.46 pCO2 35 pO2 331.  CBC white count of 21.2 and not unexpected in the face of the ongoing seizure activity hemoglobin 12.7 platelets 277 complete metabolic panel CO2 was 14 glucose 151 creatinine 1.2 LFTs  normal anion gap was 18.  Blood cultures x 2 sent and are pending.  Magnesium  was normal.  Respiratory panel was negative.  CT head no evidence of any  acute intracranial abnormality.  Evidence of the old chronic right MCA infarct.  Chest x-ray interval airspace disease bilateral space atelectasis versus pneumonia versus aspiration.   Discussed with on-call neurohospitalist.  She was very familiar with the patient.  She recommended ICU admission and they would consult to determine whether to to do EEG to rule out persistent seizure activity.  Discussed with Dr. Kara on-call for critical care.  Patient will be admitted to the [facility] ICU.      Final Clinical Impression(s) / ED Diagnoses Final diagnoses:  Status epilepticus [facility])    Rx / DC Orders ED Discharge Orders     None         Geraldene Hamilton, MD 07/29/23 [phone]

## 2023-07-28 NOTE — Progress Notes (Signed)
 Patient transported to CT scan then back to ED bed 2 on ventilator. No adverse events noted. Patient tolerated well. VSS throughout. O2 decreased to 60% upon arrival back to ED per ABG results and O2 sat of 100%.

## 2023-07-28 NOTE — ED Notes (Signed)
 Patient transported to CT

## 2023-07-29 ENCOUNTER — Inpatient Hospital Stay (HOSPITAL_COMMUNITY): Payer: Medicare Other

## 2023-07-29 DIAGNOSIS — Z1152 Encounter for screening for COVID-19: Secondary | ICD-10-CM | POA: Diagnosis not present

## 2023-07-29 DIAGNOSIS — E785 Hyperlipidemia, unspecified: Secondary | ICD-10-CM | POA: Diagnosis present

## 2023-07-29 DIAGNOSIS — F0390 Unspecified dementia without behavioral disturbance: Secondary | ICD-10-CM | POA: Diagnosis not present

## 2023-07-29 DIAGNOSIS — J9600 Acute respiratory failure, unspecified whether with hypoxia or hypercapnia: Secondary | ICD-10-CM

## 2023-07-29 DIAGNOSIS — I959 Hypotension, unspecified: Secondary | ICD-10-CM | POA: Diagnosis present

## 2023-07-29 DIAGNOSIS — B9562 Methicillin resistant Staphylococcus aureus infection as the cause of diseases classified elsewhere: Secondary | ICD-10-CM | POA: Diagnosis present

## 2023-07-29 DIAGNOSIS — J189 Pneumonia, unspecified organism: Secondary | ICD-10-CM | POA: Diagnosis present

## 2023-07-29 DIAGNOSIS — F0393 Unspecified dementia, unspecified severity, with mood disturbance: Secondary | ICD-10-CM | POA: Diagnosis present

## 2023-07-29 DIAGNOSIS — G40901 Epilepsy, unspecified, not intractable, with status epilepticus: Principal | ICD-10-CM | POA: Diagnosis present

## 2023-07-29 DIAGNOSIS — I69354 Hemiplegia and hemiparesis following cerebral infarction affecting left non-dominant side: Secondary | ICD-10-CM | POA: Diagnosis not present

## 2023-07-29 DIAGNOSIS — F32A Depression, unspecified: Secondary | ICD-10-CM | POA: Diagnosis present

## 2023-07-29 DIAGNOSIS — N400 Enlarged prostate without lower urinary tract symptoms: Secondary | ICD-10-CM | POA: Diagnosis present

## 2023-07-29 DIAGNOSIS — I10 Essential (primary) hypertension: Secondary | ICD-10-CM | POA: Diagnosis present

## 2023-07-29 DIAGNOSIS — Z681 Body mass index (BMI) 19 or less, adult: Secondary | ICD-10-CM | POA: Diagnosis not present

## 2023-07-29 DIAGNOSIS — Z79899 Other long term (current) drug therapy: Secondary | ICD-10-CM | POA: Diagnosis not present

## 2023-07-29 DIAGNOSIS — I2510 Atherosclerotic heart disease of native coronary artery without angina pectoris: Secondary | ICD-10-CM | POA: Diagnosis present

## 2023-07-29 DIAGNOSIS — J690 Pneumonitis due to inhalation of food and vomit: Secondary | ICD-10-CM | POA: Diagnosis present

## 2023-07-29 DIAGNOSIS — F17210 Nicotine dependence, cigarettes, uncomplicated: Secondary | ICD-10-CM | POA: Diagnosis present

## 2023-07-29 DIAGNOSIS — E119 Type 2 diabetes mellitus without complications: Secondary | ICD-10-CM | POA: Diagnosis present

## 2023-07-29 DIAGNOSIS — E43 Unspecified severe protein-calorie malnutrition: Secondary | ICD-10-CM | POA: Diagnosis present

## 2023-07-29 DIAGNOSIS — Z87440 Personal history of urinary (tract) infections: Secondary | ICD-10-CM | POA: Diagnosis not present

## 2023-07-29 DIAGNOSIS — R627 Adult failure to thrive: Secondary | ICD-10-CM | POA: Diagnosis present

## 2023-07-29 DIAGNOSIS — G9341 Metabolic encephalopathy: Secondary | ICD-10-CM | POA: Diagnosis present

## 2023-07-29 DIAGNOSIS — E872 Acidosis, unspecified: Secondary | ICD-10-CM | POA: Diagnosis present

## 2023-07-29 LAB — BLOOD GAS, ARTERIAL
Acid-base deficit: 0.7 mmol/L (ref 0.0–2.0)
Bicarbonate: 24.2 mmol/L (ref 20.0–28.0)
Drawn by: 38235
FIO2: 50 %
O2 Saturation: 100 %
Patient temperature: 37
pCO2 arterial: 40 mm[Hg] (ref 32–48)
pH, Arterial: 7.39 (ref 7.35–7.45)
pO2, Arterial: 127 mm[Hg] — ABNORMAL HIGH (ref 83–108)

## 2023-07-29 LAB — GLUCOSE, CAPILLARY
Glucose-Capillary: 102 mg/dL — ABNORMAL HIGH (ref 70–99)
Glucose-Capillary: 116 mg/dL — ABNORMAL HIGH (ref 70–99)
Glucose-Capillary: 68 mg/dL — ABNORMAL LOW (ref 70–99)
Glucose-Capillary: 80 mg/dL (ref 70–99)
Glucose-Capillary: 94 mg/dL (ref 70–99)
Glucose-Capillary: 95 mg/dL (ref 70–99)
Glucose-Capillary: 96 mg/dL (ref 70–99)

## 2023-07-29 LAB — URINALYSIS, ROUTINE W REFLEX MICROSCOPIC
Bilirubin Urine: NEGATIVE
Glucose, UA: NEGATIVE mg/dL
Hgb urine dipstick: NEGATIVE
Ketones, ur: NEGATIVE mg/dL
Nitrite: NEGATIVE
Protein, ur: NEGATIVE mg/dL
Specific Gravity, Urine: 1.014 (ref 1.005–1.030)
pH: 6 (ref 5.0–8.0)

## 2023-07-29 LAB — MRSA NEXT GEN BY PCR, NASAL: MRSA by PCR Next Gen: DETECTED — AB

## 2023-07-29 MED ORDER — ADULT MULTIVITAMIN W/MINERALS CH
1.0000 | ORAL_TABLET | Freq: Every day | ORAL | Status: DC
  Administered 2023-07-29 – 2023-07-30 (×2): 1
  Filled 2023-07-29 (×2): qty 1

## 2023-07-29 MED ORDER — CLOPIDOGREL BISULFATE 75 MG PO TABS
75.0000 mg | ORAL_TABLET | Freq: Every day | ORAL | Status: DC
  Administered 2023-07-29: 75 mg via ORAL
  Filled 2023-07-29: qty 1

## 2023-07-29 MED ORDER — DOCUSATE SODIUM 50 MG/5ML PO LIQD
100.0000 mg | Freq: Two times a day (BID) | ORAL | Status: DC
  Administered 2023-07-29 – 2023-07-30 (×3): 100 mg
  Filled 2023-07-29 (×3): qty 10

## 2023-07-29 MED ORDER — ENOXAPARIN SODIUM 40 MG/0.4ML IJ SOSY
40.0000 mg | PREFILLED_SYRINGE | INTRAMUSCULAR | Status: DC
  Administered 2023-07-29 – 2023-08-04 (×7): 40 mg via SUBCUTANEOUS
  Filled 2023-07-29 (×6): qty 0.4

## 2023-07-29 MED ORDER — INSULIN ASPART 100 UNIT/ML IJ SOLN
0.0000 [IU] | INTRAMUSCULAR | Status: DC
  Administered 2023-07-30: 1 [IU] via SUBCUTANEOUS

## 2023-07-29 MED ORDER — ORAL CARE MOUTH RINSE
15.0000 mL | OROMUCOSAL | Status: DC
  Administered 2023-07-29 – 2023-07-30 (×19): 15 mL via OROMUCOSAL

## 2023-07-29 MED ORDER — POLYETHYLENE GLYCOL 3350 17 G PO PACK: 17.0000 g | PACK | Freq: Every day | ORAL | Status: DC | PRN

## 2023-07-29 MED ORDER — ATORVASTATIN CALCIUM 40 MG PO TABS
80.0000 mg | ORAL_TABLET | Freq: Every day | ORAL | Status: DC
  Filled 2023-07-29: qty 2

## 2023-07-29 MED ORDER — CHLORHEXIDINE GLUCONATE CLOTH 2 % EX PADS
6.0000 | MEDICATED_PAD | Freq: Every day | CUTANEOUS | Status: DC
  Administered 2023-07-29 – 2023-07-31 (×4): 6 via TOPICAL

## 2023-07-29 MED ORDER — PROSOURCE TF20 ENFIT COMPATIBL EN LIQD
60.0000 mL | Freq: Every day | ENTERAL | Status: DC
  Administered 2023-07-29 – 2023-07-30 (×2): 60 mL
  Filled 2023-07-29 (×2): qty 60

## 2023-07-29 MED ORDER — DEXTROSE 50 % IV SOLN
INTRAVENOUS | Status: AC
  Administered 2023-07-29: 50 mL
  Filled 2023-07-29: qty 50

## 2023-07-29 MED ORDER — PROPOFOL 1000 MG/100ML IV EMUL
0.0000 ug/kg/min | INTRAVENOUS | Status: DC
  Administered 2023-07-29: 24 ug/kg/min via INTRAVENOUS
  Administered 2023-07-29: 25 ug/kg/min via INTRAVENOUS
  Administered 2023-07-29 – 2023-07-30 (×2): 30 ug/kg/min via INTRAVENOUS
  Filled 2023-07-29 (×4): qty 100

## 2023-07-29 MED ORDER — ASPIRIN 81 MG PO CHEW
81.0000 mg | CHEWABLE_TABLET | Freq: Every day | ORAL | Status: DC
  Administered 2023-07-30: 81 mg
  Filled 2023-07-29: qty 1

## 2023-07-29 MED ORDER — CEFTRIAXONE SODIUM 2 G IJ SOLR
2.0000 g | INTRAMUSCULAR | Status: AC
  Administered 2023-07-29 – 2023-08-02 (×5): 2 g via INTRAVENOUS
  Filled 2023-07-29 (×5): qty 20

## 2023-07-29 MED ORDER — ORAL CARE MOUTH RINSE
15.0000 mL | OROMUCOSAL | Status: DC | PRN
Start: 2023-07-29 — End: 2023-08-04

## 2023-07-29 MED ORDER — THIAMINE MONONITRATE 100 MG PO TABS
100.0000 mg | ORAL_TABLET | Freq: Every day | ORAL | Status: DC
  Administered 2023-07-29 – 2023-07-30 (×2): 100 mg
  Filled 2023-07-29 (×2): qty 1

## 2023-07-29 MED ORDER — FAMOTIDINE 20 MG PO TABS
20.0000 mg | ORAL_TABLET | Freq: Every day | ORAL | Status: DC
  Administered 2023-07-30: 20 mg
  Filled 2023-07-29: qty 1

## 2023-07-29 MED ORDER — DOCUSATE SODIUM 50 MG/5ML PO LIQD: 100.0000 mg | Freq: Two times a day (BID) | ORAL | Status: DC | PRN

## 2023-07-29 MED ORDER — ASPIRIN 81 MG PO TBEC
81.0000 mg | DELAYED_RELEASE_TABLET | Freq: Every day | ORAL | Status: DC
  Administered 2023-07-29: 81 mg via ORAL
  Filled 2023-07-29: qty 1

## 2023-07-29 MED ORDER — SODIUM CHLORIDE 0.9 % IV BOLUS
1000.0000 mL | Freq: Once | INTRAVENOUS | Status: AC
  Administered 2023-07-29: 1000 mL via INTRAVENOUS

## 2023-07-29 MED ORDER — MAGNESIUM SULFATE 2 GM/50ML IV SOLN
2.0000 g | Freq: Once | INTRAVENOUS | Status: AC
Start: 2023-07-29 — End: 2023-07-29
  Administered 2023-07-29: 2 g via INTRAVENOUS
  Filled 2023-07-29: qty 50

## 2023-07-29 MED ORDER — POLYETHYLENE GLYCOL 3350 17 G PO PACK
17.0000 g | PACK | Freq: Every day | ORAL | Status: DC
  Administered 2023-07-29 – 2023-07-30 (×2): 17 g
  Filled 2023-07-29 (×2): qty 1

## 2023-07-29 MED ORDER — NOREPINEPHRINE 4 MG/250ML-% IV SOLN
2.0000 ug/min | INTRAVENOUS | Status: DC
  Administered 2023-07-30: 3 ug/min via INTRAVENOUS
  Filled 2023-07-29: qty 250

## 2023-07-29 MED ORDER — CLOPIDOGREL BISULFATE 75 MG PO TABS
75.0000 mg | ORAL_TABLET | Freq: Every day | ORAL | Status: DC
  Administered 2023-07-30: 75 mg
  Filled 2023-07-29: qty 1

## 2023-07-29 MED ORDER — FAMOTIDINE 20 MG PO TABS
20.0000 mg | ORAL_TABLET | Freq: Two times a day (BID) | ORAL | Status: DC
  Administered 2023-07-29 (×2): 20 mg
  Filled 2023-07-29 (×2): qty 1

## 2023-07-29 MED ORDER — SODIUM CHLORIDE 0.9 % IV SOLN
750.0000 mg | Freq: Two times a day (BID) | INTRAVENOUS | Status: DC
  Administered 2023-07-29 – 2023-07-30 (×4): 750 mg via INTRAVENOUS
  Filled 2023-07-29 (×7): qty 7.5

## 2023-07-29 MED ORDER — FENTANYL CITRATE PF 50 MCG/ML IJ SOSY
25.0000 ug | PREFILLED_SYRINGE | INTRAMUSCULAR | Status: DC | PRN
  Administered 2023-07-29 (×2): 50 ug via INTRAVENOUS
  Administered 2023-07-30: 100 ug via INTRAVENOUS
  Filled 2023-07-29 (×2): qty 1
  Filled 2023-07-29: qty 2

## 2023-07-29 MED ORDER — ATORVASTATIN CALCIUM 40 MG PO TABS: 80.0000 mg | ORAL_TABLET | Freq: Every day | ORAL | Status: DC

## 2023-07-29 MED ORDER — FAMOTIDINE 20 MG PO TABS: 20.0000 mg | ORAL_TABLET | Freq: Two times a day (BID) | ORAL | Status: DC

## 2023-07-29 MED ORDER — OSMOLITE 1.5 CAL PO LIQD
1000.0000 mL | ORAL | Status: DC
Start: 2023-07-29 — End: 2023-07-31
  Administered 2023-07-29: 1000 mL
  Filled 2023-07-29 (×3): qty 1000

## 2023-07-29 MED ORDER — ACETAMINOPHEN 325 MG PO TABS
650.0000 mg | ORAL_TABLET | ORAL | Status: DC | PRN
  Administered 2023-07-30: 650 mg
  Filled 2023-07-29: qty 2

## 2023-07-29 MED ORDER — DOCUSATE SODIUM 100 MG PO CAPS: 100.0000 mg | ORAL_CAPSULE | Freq: Two times a day (BID) | ORAL | Status: DC | PRN

## 2023-07-29 MED ORDER — SODIUM CHLORIDE 0.9 % IV SOLN
250.0000 mL | INTRAVENOUS | Status: DC
  Administered 2023-07-29: 250 mL via INTRAVENOUS

## 2023-07-29 MED ORDER — FENTANYL CITRATE PF 50 MCG/ML IJ SOSY: 25.0000 ug | PREFILLED_SYRINGE | INTRAMUSCULAR | Status: DC | PRN

## 2023-07-29 NOTE — Consult Note (Signed)
 NEUROLOGY CONSULT NOTE   Date of service: July 29, 2023  Patient Name: Jacob Mcgrath. MRN:  [id number] DOB:  12-Nov-1955 Chief Complaint: altered mental status Requesting Provider: Kara Dorn NOVAK, MD  History of Present Illness  Jacob Mcgrath. is a 68 y.o. 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  Given 1 mg Ativan  x 3 doses followed by loading dose of Keppra  of 1500 mg, intubated for airway protection and given vancomycin  and ceftriaxone  for sepsis.  On propofol  for sedation/seizure control  Discussed with Dr. Zackowski, due to high likelihood of prolonged postictal state and potential for subclinical status, should be admitted to [facility] as there is high likelihood that he will need continuous EEG monitoring.    Dr. Zackowski notes patient was getting scheduled Keppra  per records from [facility] facility  I last saw him while he was hospitalized 12/6 - 12/10 with new onset seizures, for which he was started on Keppra  500 mg twice daily.  He was also treated for UTI and community-acquired pneumonia  Regarding goals of care, per palliative care note from 07/04/2023, daughter was previously guardian but [facility] Marcelline Gaskins ultimately took over guardianship as APS felt he was being exploited.  Recommendation was for transition to DNR CODE STATUS with plans for Ms. Clark to discuss this with her team due to failure to thrive  and declining swallow function    ROS  Unable to assess secondary to patient's mental status (baseline oriented to self only)  Past History   Past  Medical History:  Diagnosis Date   Dementia (HCC)    Diabetes mellitus without complication (HCC)    Hypertension    Stroke [facility])     Past Surgical History:  Procedure Laterality Date   ABDOMINAL AORTOGRAM W/LOWER EXTREMITY N/A 10/04/2022   Procedure: ABDOMINAL AORTOGRAM W/LOWER EXTREMITY;  Surgeon: Eliza Lonni RAMAN, MD;  Location: [facility] INVASIVE CV LAB;  Service: Cardiovascular;  Laterality: N/A;   ABDOMINAL AORTOGRAM W/LOWER EXTREMITY N/A 10/08/2022   Procedure: ABDOMINAL AORTOGRAM W/LOWER EXTREMITY;  Surgeon: Magda Debby SAILOR, MD;  Location: [facility] INVASIVE CV LAB;  Service: Cardiovascular;  Laterality: N/A;   PERIPHERAL VASCULAR INTERVENTION  10/04/2022   Procedure: PERIPHERAL VASCULAR INTERVENTION;  Surgeon: Eliza Lonni RAMAN, MD;  Location: [facility] INVASIVE CV LAB;  Service: Cardiovascular;;   PERIPHERAL VASCULAR INTERVENTION Left 10/08/2022   Procedure: PERIPHERAL VASCULAR INTERVENTION;  Surgeon: Magda Debby SAILOR, MD;  Location: [facility] INVASIVE CV LAB;  Service: Cardiovascular;  Laterality: Left;   TONSILLECTOMY      Family History: History reviewed. No pertinent family history.  Social History  reports that he has been smoking cigarettes. He does not have any smokeless tobacco history on file. He reports that he does not currently use alcohol. He reports that he does not currently use drugs.  Allergies  Allergen Reactions   Amoxicillin Other (See Comments)    Unknown reaction per facility   Ampicillin Other (See Comments)    Unknown reaction per facility   Tetracyclines & Related Rash    Medications   Current Facility-Administered Medications:    0.9 %  sodium chloride  infusion, , Intravenous, Continuous, Zackowski, Scott, MD, Last  Rate: 100 mL/hr at 07/28/23 2059, New Bag at 07/28/23 2059   fentaNYL  (SUBLIMAZE ) 50 MCG/ML injection, , , ,    fentaNYL  (SUBLIMAZE ) injection 50 mcg, 50 mcg, Intravenous, Once, Zackowski, Scott, MD   ketamine  HCl 50 MG/5ML SOSY, , , ,     midazolam  (VERSED ) 2 MG/2ML injection, , , ,    propofol  (DIPRIVAN ) 1000 MG/100ML infusion, 5-80 mcg/kg/min, Intravenous, Continuous, Zackowski, Scott, MD, Paused at 07/28/23 2350   rocuronium  (ZEMURON ) 100 MG/10ML injection, , , ,   Current Outpatient Medications:    acetaminophen  (TYLENOL ) 325 MG tablet, Take 325 mg by mouth every 6 (six) hours as needed for mild pain (pain score 1-3)., Disp: , Rfl:    aspirin  EC 81 MG tablet, Take 81 mg by mouth daily. Swallow whole., Disp: , Rfl:    atorvastatin  (LIPITOR ) 80 MG tablet, Take 80 mg by mouth at bedtime., Disp: , Rfl:    Baclofen  5 MG TABS, Take 1 tablet by mouth in the morning, at noon, and at bedtime., Disp: , Rfl:    clopidogrel  (PLAVIX ) 75 MG tablet, Take 1 tablet (75 mg total) by mouth daily with breakfast., Disp: , Rfl:    DULoxetine  (CYMBALTA ) 30 MG capsule, Take 30 mg by mouth daily., Disp: , Rfl:    guaiFENesin (MUCINEX MAXIMUM STRENGTH PO), Take 600 mg by mouth every 12 (twelve) hours as needed (cough/congestion)., Disp: , Rfl:    levETIRAcetam  (KEPPRA ) 500 MG tablet, Take 500 mg by mouth 2 (two) times daily., Disp: , Rfl:    melatonin 3 MG TABS tablet, Take 2 tablets (6 mg total) by mouth at bedtime., Disp: 90 tablet, Rfl: 1   Menthol, Topical Analgesic, (BIOFREEZE EX), Apply 1 Application topically at bedtime., Disp: , Rfl:    metoprolol  tartrate (LOPRESSOR ) 25 MG tablet, Take 1 tablet (25 mg total) by mouth 2 (two) times daily., Disp: , Rfl:    Multiple Vitamin (MULTIVITAMIN) tablet, Take 1 tablet by mouth daily., Disp: , Rfl:    pantoprazole  (PROTONIX ) 40 MG tablet, Take 1 tablet (40 mg total) by mouth daily., Disp: , Rfl:    PARoxetine  (PAXIL ) 40 MG tablet, Take 40 mg by mouth every morning., Disp: , Rfl:    tamsulosin  (FLOMAX ) 0.4 MG CAPS capsule, Take 1 capsule (0.4 mg total) by mouth daily., Disp: 30 capsule, Rfl:   Vitals   Vitals:   07/29/23 0000 07/29/23 0005 07/29/23 0008 07/29/23 0010  BP: (!) 48/35 (!) 54/46 (!) 64/49  (!) 82/58  Pulse: (!) 57 76 60 75  Resp: 20 20 20 20   Temp: 99 F (37.2 C) 99 F (37.2 C) 99 F (37.2 C) 99 F (37.2 C)  TempSrc: Bladder Bladder Bladder Bladder  SpO2: 100% 100% 100% 100%  Weight:      Height:        Body mass index is 19.27 kg/m.  Physical Exam   Constitutional: Chronically ill appearing, frail, thin Psych: Minimally interactive Eyes: Scleral edema is absent HENT: ET tube in place.  MSK: left hand contracture  Cardiovascular: Normal rate and regular rhythm.  Respiratory: Breathing comfortably on the ventilator on sedation Breathes over the vent with sedation paused  Skin: Warm dry and intact visible skin.  There is no significant edema  Neurologic Examination   Neuro: Mental Status: Does not open eyes spontaneously, to voice or noxious stimulation Does not follow any commands but does respond to touch  Cranial Nerves: II: Pupils are equal, round, and reactive to light.  III,IV, VI/VIII: EOMI incomplete to VOR, gaze midline  V/VII: Stronger blink to the eyelash brush on the right compared to the left  VIII: No clear response to voice X/XI: Subtle cough XII: Unable to assess tongue protrusion secondary to patient's mental status  Motor/Sensory: Diffuse muscle wasting. Left hemiparesis at baseline, spastic tone of the LUE in particular. Intermittent slight tremor LUE.  Deep Tendon Reflexes: Increase on the left compared to the right. 3 to 4+ throughout  Plantars: Toes downgoing on the right, mute on the left  Cerebellar: Unable to assess secondary to patient's mental status    Labs/Imaging/Neurodiagnostic studies   CBC:  Recent Labs  Lab 08/15/23 2004  WBC 21.2*  NEUTROABS 14.7*  HGB 12.7*  HCT 41.2  MCV 97.4  PLT 277   Note chronic baseline leukocytosis ranging from 15-20  Basic Metabolic Panel:  Lab Results  Component Value Date   NA 142 2023-08-15   K 4.3 15-Aug-2023   CO2 14 (L) 15-Aug-2023   GLUCOSE 151 (H) Aug 15, 2023   BUN 17  2023-08-15   CREATININE 1.29 (H) Aug 15, 2023   CALCIUM  9.0 2023/08/15   GFRNONAA >60 15-Aug-2023   Lipid Panel:  Lab Results  Component Value Date   LDLCALC 40 10/05/2022   HgbA1c:  Lab Results  Component Value Date   HGBA1C 5.4 07/01/2023   Urine Drug Screen:     Component Value Date/Time   LABOPIA NONE DETECTED 07/01/2023 1127   COCAINSCRNUR NONE DETECTED 07/01/2023 1127   LABBENZ NONE DETECTED 07/01/2023 1127   AMPHETMU NONE DETECTED 07/01/2023 1127   THCU NONE DETECTED 07/01/2023 1127   LABBARB NONE DETECTED 07/01/2023 1127    Alcohol Level     Component Value Date/Time   ETH <10 07/01/2023 1236   INR  Lab Results  Component Value Date   INR 1.3 (H) 07/02/2023   APTT  Lab Results  Component Value Date   APTT 32 07/02/2023   Lactate improving from 9 to 2.4 with fluid resuscitation and at [facility]   CT Head without contrast(Personally reviewed):  1. No CT evidence for acute intracranial abnormality. 2. Chronic right MCA infarct. Chronic small vessel ischemic changes of the white matter.  Neurodiagnostics EEG:  Pending   ASSESSMENT   Anuj Darrien Belter. is a 68 y.o. male  has a past medical history of Dementia (HCC), Diabetes mellitus without complication (HCC), Hypertension, and Stroke (HCC).  Breakthough seizure with no clear triggering etiology. Somewhat responsive on exam but far from baseline, based on age, length of clinical seizure activity prior to presentation and while at [facility] suspect he will have a prolonged recovery time. Will obtain EEG to clarify as sedation is weaned. Given no clear trigger, increase home dose of keppra .   RECOMMENDATIONS  -LTM EEG  -UA ordered  -Increase home Keppra  dose from 500 twice daily to 750 twice daily, this will be max for his current creatinine clearance (renal function appears to be at baseline from last discharge) Estimated Creatinine Clearance: 46.5 mL/min (A) (by C-G formula based on SCr of 1.29 mg/dL  (H)).   CrCl 80 to 130 mL/minute/1.73 m2: 500 mg to 1.5 g every 12 hours.  CrCl 50 to <80 mL/minute/1.73 m2: 500 mg to 1 g every 12 hours.  CrCl 30 to <50 mL/minute/1.73 m2: 250 to 750 mg every 12 hours.  CrCl 15 to <30 mL/minute/1.73 m2: 250 to 500 mg every 12 hours.  CrCl <15 mL/minute/1.73 m2: 250 to 500 mg every 24 hours (  expert opinion). -Appreciate transition to precedex if possible once EEG is running, taper propofol  by 10 / hr -Appreciate CCM management of ventilator, infectious workup and management ______________________________________________________________________   Lola Jernigan MD-PhD [facility] [phone]  CRITICAL CARE Performed by: Lola LITTIE Jernigan   Total critical care time: 40 minutes  Critical care time was exclusive of separately billable procedures and treating other patients.  Critical care was necessary to treat or prevent imminent or life-threatening deterioration.  Critical care was time spent personally by me on the following activities: development of treatment plan with patient and/or surrogate as well as nursing, discussions with consultants, evaluation of patient's response to treatment, examination of patient, obtaining history from patient or surrogate, ordering and performing treatments and interventions, ordering and review of laboratory studies, ordering and review of radiographic studies, pulse oximetry and re-evaluation of patient's condition.

## 2023-07-29 NOTE — Progress Notes (Signed)
 LTM EEG running - no initial skin breakdown - push button tested - neuro notified.  ATRIUM NOTIFIED HU charge captured

## 2023-07-29 NOTE — Progress Notes (Addendum)
 eLink Physician-Brief Progress Note Patient Name: Jacob Mcgrath. DOB: [birth year] MRN: [id number]   Date of Service  07/29/2023  HPI/Events of Note  68 year old male with a history of seizure disorder, coronary artery disease on dual antiplatelet therapy, and BPH who developed status epilepticus with jerking mostly on his right side and known left-sided neurodeficits.  He was intubated, started on AEDs, and transferred to [facility] health for further management.  On examination, vital signs are normal.  He is ventilated with 40% FiO2 saturating 100%.  He is on propofol  infusion.  Results consistent with normal oxygenation and ventilation.  Metabolic panel consistent with anion gap metabolic acidosis with mild lactic acidosis.  Leukocytosis present.  With abdominal radiograph, NG tube projects over left hemidiaphragm-suspect in the stomach.  Chest radiograph with bibasilar disease.  eICU Interventions  Maintain home Keppra  and propofol  infusion, EEG pending.  Daily spontaneous awakening/breathing trial  Maintain dual antiplatelet therapy, statin, insulin  therapy  Empiric ceftriaxone .  Trend procalcitonin.  DVT prophylaxis with enoxaparin  GI prophylaxis with famotidine    0612 -hypotensive, improved after backing off of propofol  without evidence of clear seizure-like activity.  Recommended that propofol  be reinitiated at a minimum of half dose until EEG can be performed.  Utilize norepinephrine  as needed to maintain MAP.  Intervention Category Evaluation Type: New Patient Evaluation  Abagael Kramm 07/29/2023, 4:15 AM

## 2023-07-29 NOTE — Plan of Care (Signed)

## 2023-07-29 NOTE — Progress Notes (Signed)
 Initial Nutrition Assessment  DOCUMENTATION CODES:  Severe malnutrition in context of social or environmental circumstances  INTERVENTION:  If pt not extubated and / or enteral feeds are needed, recommend the following via OGT: Osmolite 1.5 at 50 ml/h (1200 ml per day) Prosource TF20 60 ml 1x/d Provides 1880 kcal, 95 gm protein, 917 ml free water daily Pt is severely malnourished. If extubated and diet is able to be ordered, recommend most liberal diet possible as based on pt's muscle and fat wasting, inadequate intake is a chronic issue. MVI with minerals daily  NUTRITION DIAGNOSIS:  Severe Malnutrition related to social / environmental circumstances (inadequate energy intake) as evidenced by severe fat depletion, severe muscle depletion.  GOAL:   Patient will meet greater than or equal to 90% of their needs  MONITOR:   Vent status, Labs, Weight trends, I & O's  REASON FOR ASSESSMENT:  Ventilator    ASSESSMENT:  Pt with hx of HTN, CVA with residual left sided deficits, DM type 2, and dementia presented to ED from SNF with seizures.  1/2 - presented to [facility] ED, intubated 1/3 - transferred to [facility]  Pt resting in bed at the time of assessment. Patient is currently intubated on ventilator support. No family in room to provide a history.   Discussed in rounds, hopeful to extubate today after sedation is weaned. On exam, pt is severely malnourished with significant loss of muscle and fat deficits. If unable to wean from ventilator recommend enteral feeds. Left recommendations.   Pending extubation and bedside swallow, recommend regular diet (or softer texture if needed) to allow for maximum food options and encourage PO intake. Based on pt's weight and loss of muscle and fat stores inadequate oral intake is a chronic issue.  MV: 9.6 L/min Temp (24hrs), Avg:98.5 F (36.9 C), Min:97.9 F (36.6 C), Max:99.2 F (37.3 C)  Propofol : 16 ml/hr (422 kcal/d)  Admit weight: 59.2 kg   Current weight: 59.5 kg   Intake/Output Summary (Last 24 hours) at 07/29/2023 1115 Last data filed at 07/29/2023 1037 Gross per 24 hour  Intake 1183.41 ml  Output 560 ml  Net 623.41 ml  Net IO Since Admission: 623.41 mL [07/29/23 1115]  Drains/Lines: OGT 62F UOP x 24 hours  Nutritionally Relevant Medications: Scheduled Meds:  atorvastatin   80 mg Oral QHS   docusate  100 mg Per Tube BID   famotidine   20 mg Per Tube BID   insulin  aspart  0-9 Units Subcutaneous Q4H   polyethylene glycol  17 g Per Tube Daily   Continuous Infusions:  sodium chloride  100 mL/hr at 07/29/23 0450   sodium chloride  250 mL (07/29/23 0921)   cefTRIAXone  (ROCEPHIN )  IV     norepinephrine  (LEVOPHED ) Adult infusion     propofol  (DIPRIVAN ) infusion 45 mcg/kg/min (07/29/23 0307)   PRN Meds: docusate sodium , polyethylene glycol  Labs Reviewed: Creatinine 1.29 CBG ranges from 90-133 mg/dL over the last 24 hours HgbA1c 5.4% (12/6)  NUTRITION - FOCUSED PHYSICAL EXAM: Flowsheet Row Most Recent Value  Orbital Region Severe depletion  Upper Arm Region Severe depletion  Thoracic and Lumbar Region Moderate depletion  Buccal Region Severe depletion  Temple Region Severe depletion  Clavicle Bone Region Severe depletion  Clavicle and Acromion Bone Region Severe depletion  Scapular Bone Region Moderate depletion  Dorsal Hand Unable to assess  [mittens]  Patellar Region Severe depletion  Anterior Thigh Region Severe depletion  Posterior Calf Region Severe depletion  Edema (RD Assessment) None  Hair Reviewed  Eyes Reviewed  Mouth Reviewed  Skin Reviewed  Nails Unable to assess  [mittens]       Diet Order:   Diet Order             Diet NPO time specified Except for: Other (See Comments)  Diet effective now                   EDUCATION NEEDS:  Not appropriate for education at this time  Skin:  Skin Assessment: Reviewed RN Assessment  Last BM:  prior to admission  Height:  Ht  Readings from Last 1 Encounters:  07/28/23 5' 9 (1.753 m)    Weight:  Wt Readings from Last 1 Encounters:  07/29/23 59.9 kg    Ideal Body Weight:  72.7 kg  BMI:  Body mass index is 19.5 kg/m.  Estimated Nutritional Needs:  Kcal:  1700-1900 kcal/d Protein:  90-105g/d Fluid:  >/=1.8L/d    Vernell Lukes, RD, LDN Registered Dietitian II Please reach out via secure chat Weekend on-call pager # available in [facility]

## 2023-07-29 NOTE — Procedures (Addendum)
 Patient Name: Jacob Mcgrath.  MRN: [id number]  Epilepsy Attending: Arlin MALVA Krebs  Referring Physician/Provider: Jerrie Lola CROME, MD   Duration: 07/29/2023 9470 to 07/30/2023 9470  Patient history:  68 y.o. male with breakthrough seizure with no clear triggering etiology. EEG to evaluate for seizure.  Level of alertness:  comatose  AEDs during EEG study: Propofol , LEV  Technical aspects: This EEG study was done with scalp electrodes positioned according to the 10-20 International system of electrode placement. Electrical activity was reviewed with band pass filter of 1-70Hz , sensitivity of 7 uV/mm, display speed of 70mm/sec with a 60Hz  notched filter applied as appropriate. EEG data were recorded continuously and digitally stored.  Video monitoring was available and reviewed as appropriate.  Description: EEG showed continuous generalized and lateralized right hemisphere 3 to 6 Hz theta- delta slowing admixed with 12 to 13 Hz beta activity.  Hyperventilation and photic stimulation were not performed.     ABNORMALITY - Continuous slow, generalized and lateralized right hemisphere  IMPRESSION: This study is suggestive of cortical dysfunction in right hemisphere likely secondary to underlying encephalomalacia.  Additionally there is moderate to severe diffuse encephalopathy, likely related to sedation.  No seizures or epileptiform discharges were seen throughout the recording.  Melba Araki O Landrum Carbonell

## 2023-07-29 NOTE — Progress Notes (Signed)
 NEUROLOGY CONSULT FOLLOW UP NOTE   Date of service: July 29, 2023 Patient Name: Jacob Mcgrath. MRN:  [id number] DOB:  1955-10-20  Brief HPI  Jacob Kaion Tisdale. is a 68 y.o. male  has a past medical history of Dementia (HCC), Diabetes mellitus without complication (HCC), Hypertension, and Stroke (HCC). who presented with generalized tonic-clonic seizure activity.  He was 3 doses of 1 mg of Ativan  and loaded on Keppra .  He was then intubated for airway protection and remains on propofol .  Was found to have sepsis and was started on vancomycin  and ceftriaxone .  He was then transferred here for long-term EEG.  He was receiving his scheduled Keppra  at his facility.   Interval Hx/subjective   Patient has been largely hemodynamically stable with 1 episode of hypotension.  He remains intubated on propofol  with plans to wean propofol  and transition to Precedex if necessary.  LTM EEG shows no seizures or epileptiform discharges. Vitals   Vitals:   07/29/23 0700 07/29/23 0800 07/29/23 0815 07/29/23 0830  BP: 138/78 (!) 171/77 (!) 146/68 (!) 145/71  Pulse: 76 93 89 90  Resp: 18 (!) 21 18 18   Temp: 98.4 F (36.9 C) 98.6 F (37 C) 98.8 F (37.1 C) 99 F (37.2 C)  TempSrc:  Bladder    SpO2: 100% 100% 100% 100%  Weight:      Height:         Body mass index is 19.5 kg/m.  Physical Exam   Constitutional: Well-developed, well-nourished intubated patient in no acute distress Eyes: No scleral injection.  HENT: ET tube in place Head: Normocephalic.  Cardiovascular: Normal rate and regular rhythm.  Respiratory: Respirations synchronous with ventilator Skin: WDI.   Neurologic Examination   (On sedation with moderate dose propofol ) pupils equal round and reactive, no oculocephalic reflex present, corneal reflexes present, cough reflex present unable to follow commands and does not respond to voice but will move all extremities slightly to sternal rub and grimaces to proximal pinch but does  not move his extremities.  He is able to localize a nasal swab with his right upper extremity but does not try to localize with his left, this is consistent with his known history of right MCA stroke  Medications  Current Facility-Administered Medications:    0.9 %  sodium chloride  infusion, , Intravenous, Continuous, Jacob Mcgrath, Scott, Jacob Mcgrath, Last Rate: 100 mL/hr at 07/29/23 0450, New Bag at 07/29/23 0450   0.9 %  sodium chloride  infusion, 250 mL, Intravenous, Continuous, Jacob Mcgrath, Aditya, Jacob Mcgrath, Last Rate: 10 mL/hr at 07/29/23 0921, 250 mL at 07/29/23 9078   acetaminophen  (TYLENOL ) tablet 650 mg, 650 mg, Per Tube, Q4H PRN, Payne, Jacob Mcgrath, Jacob Mcgrath   aspirin  EC tablet 81 mg, 81 mg, Oral, Daily, Payne, Jacob Mcgrath, Jacob Mcgrath, 81 mg at 07/29/23 9087   atorvastatin  (LIPITOR ) tablet 80 mg, 80 mg, Oral, QHS, Payne, Jacob Mcgrath, Jacob Mcgrath   cefTRIAXone  (ROCEPHIN ) 2 g in sodium chloride  0.9 % 100 mL IVPB, 2 g, Intravenous, Q24H, Jacob Mcgrath, Jacob Mcgrath, Jacob Mcgrath   Chlorhexidine  Gluconate Cloth 2 % PADS 6 each, 6 each, Topical, Q0600, Jacob Mcgrath, Jacob Mcgrath, Jacob Mcgrath, 6 each at 07/29/23 0449   clopidogrel  (PLAVIX ) tablet 75 mg, 75 mg, Oral, Q breakfast, Payne, Jacob Mcgrath, Jacob Mcgrath, 75 mg at 07/29/23 0912   docusate (COLACE) 50 MG/5ML liquid 100 mg, 100 mg, Per Tube, BID, Payne, Jacob Mcgrath, Jacob Mcgrath, 100 mg at 07/29/23 9087   docusate sodium  (COLACE) capsule 100 mg, 100 mg, Oral, BID PRN, Payne, Jacob Mcgrath,  Jacob Mcgrath   enoxaparin  (LOVENOX ) injection 40 mg, 40 mg, Subcutaneous, Q24H, Payne, Jacob Mcgrath, Jacob Mcgrath, 40 mg at 07/29/23 9087   famotidine  (PEPCID ) tablet 20 mg, 20 mg, Per Tube, BID, Jacob Norleen BIRCH, Jacob Mcgrath, 20 mg at 07/29/23 9087   fentaNYL  (SUBLIMAZE ) injection 25 mcg, 25 mcg, Intravenous, Q15 min PRN, Payne, Jacob Mcgrath, Jacob Mcgrath   fentaNYL  (SUBLIMAZE ) injection 25-100 mcg, 25-100 mcg, Intravenous, Q30 min PRN, Jacob Norleen BIRCH, Jacob Mcgrath, 50 mcg at 07/29/23 9195   fentaNYL  (SUBLIMAZE ) injection 50 mcg, 50 mcg, Intravenous, Once, Jacob Mcgrath, Scott, Jacob Mcgrath   insulin  aspart (novoLOG ) injection 0-9 Units,  0-9 Units, Subcutaneous, Q4H, Jacob, Jacob Mcgrath, Jacob Mcgrath   levETIRAcetam  (KEPPRA ) 750 mg in sodium chloride  0.9 % 100 mL IVPB, 750 mg, Intravenous, Q12H, Jacob Mcgrath, Jacob Mcgrath, Jacob Mcgrath, Last Rate: 430 mL/hr at 07/29/23 0723, 750 mg at 07/29/23 9276   norepinephrine  (LEVOPHED ) 4mg  in (0.016 mg/mL) premix infusion, 2-10 mcg/min, Intravenous, Titrated, Jacob Mcgrath, Aditya, Jacob Mcgrath   Oral care mouth rinse, 15 mL, Mouth Rinse, Q2H, Jacob Dorn NOVAK, Jacob Mcgrath, 15 mL at 07/29/23 1032   Oral care mouth rinse, 15 mL, Mouth Rinse, PRN, Jacob Dorn NOVAK, Jacob Mcgrath   polyethylene glycol (MIRALAX  / GLYCOLAX ) packet 17 g, 17 g, Per Tube, Daily, Jacob Norleen BIRCH, Jacob Mcgrath, 17 g at 07/29/23 0912   polyethylene glycol (MIRALAX  / GLYCOLAX ) packet 17 g, 17 g, Oral, Daily PRN, Jacob Norleen Mcgrath, Jacob Mcgrath   propofol  (DIPRIVAN ) 1000 MG/100ML infusion, 0-50 mcg/kg/min, Intravenous, Continuous, Jacob Norleen BIRCH, Jacob Mcgrath, Last Rate: 10.66 mL/hr at 07/29/23 1045, 30 mcg/kg/min at 07/29/23 1045 Labs and Diagnostic Imaging   CBC:  Recent Labs  Lab 07/28/23 2004  WBC 21.2*  NEUTROABS 14.7*  HGB 12.7*  HCT 41.2  MCV 97.4  PLT 277    Basic Metabolic Panel:  Lab Results  Component Value Date   NA 142 07/28/2023   K 4.3 07/28/2023   CO2 14 (Mcgrath) 07/28/2023   GLUCOSE 151 (H) 07/28/2023   BUN 17 07/28/2023   CREATININE 1.29 (H) 07/28/2023   CALCIUM  9.0 07/28/2023   GFRNONAA >60 07/28/2023   Lipid Panel:  Lab Results  Component Value Date   LDLCALC 40 10/05/2022   HgbA1c:  Lab Results  Component Value Date   HGBA1C 5.4 07/01/2023   Urine Drug Screen:     Component Value Date/Time   LABOPIA NONE DETECTED 07/01/2023 1127   COCAINSCRNUR NONE DETECTED 07/01/2023 1127   LABBENZ NONE DETECTED 07/01/2023 1127   AMPHETMU NONE DETECTED 07/01/2023 1127   THCU NONE DETECTED 07/01/2023 1127   LABBARB NONE DETECTED 07/01/2023 1127    Alcohol Level     Component Value Date/Time   ETH <10 07/01/2023 1236   INR  Lab Results  Component Value Date   INR 1.3  (H) 07/02/2023   APTT  Lab Results  Component Value Date   APTT 32 07/02/2023   AED levels: No results found for: PHENYTOIN, ZONISAMIDE, LAMOTRIGINE, LEVETIRACETA  CT Head without contrast(Personally reviewed): No acute abnormality, old right MCA territory stroke  rEEG:  Continuous slow, generalized and lateralized right hemisphere, suggestive of cortical dysfunction in the right hemisphere secondary to underlying encephalomalacia with moderate to severe diffuse encephalopathy, likely related to sedation and no seizures or epileptiform discharges  Assessment   Jacob Wm Sahagun. is a 68 y.o. 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  Patient was given Ativan  and loaded with Keppra ,  then intubated for airway protection.  She remains intubated on propofol  with hopes to wean propofol  today and either extubate or transition to Precedex.  It does not appear that he missed any doses of his Keppra  while at his facility, and Keppra  was increased here to 750 mg twice daily.  Long-term EEG demonstrates no seizure activity, but will need to assess this when he is off the propofol .  Recommendations  -Continue LTM EEG for at least 1 more day - Continue Keppra  750 mg twice daily, this is maximum dose given his renal function - Wean off propofol  and transition to Precedex if patient cannot be extubated - Continue seizure precautions ______________________________________________________________________  Patient seen by NP and then by MD, Jacob Mcgrath to edit note as needed. Signed, Jacob Mcgrath Everitt Jacob Kill, NP [facility]  NEUROHOSPITALIST ADDENDUM Performed a face to face diagnostic evaluation.   I have reviewed the contents of history and physical exam as documented by PA/ARNP/Resident and agree with above documentation.  I have discussed and  formulated the above plan as documented. Edits to the note have been made as needed.  Impression/Key exam findings/Plan: No seizures overnight on LTM. Will wean down sedation while keeping him on LTM. If we do not see any more seizures, will take him off LTM tomorrow AM.  Myshawn Chir[place], Jacob Mcgrath [facility] [phone]   If 7pm to 7am, please call on call as listed on AMION.

## 2023-07-29 NOTE — Progress Notes (Signed)
 Critical care progress note  Admitted early this morning for seizures requiring intubation for airway protection.  Background of prior stroke, dementia and diabetes.  Lives in a skilled nursing facility.  On examination, on low-dose propofol  with no clinical seizure activity.  EEG reviewed showing generalized slowing  Plan:  -Discussed with neurology appropriate to wean propofol  off and proceed to SBT and extubation as mental status allows.  Fredia Alderton, MD [facility] ICU Physician [facility] [place] [facility]  Pager: [phone] Or Epic Secure Chat After hours: [phone].  07/29/2023, 2:11 PM

## 2023-07-29 NOTE — Progress Notes (Signed)
 RT attempted to get a sputum culture without success.

## 2023-07-29 NOTE — H&P (Signed)
 NAME:  Jacob Matton., MRN:  [id number], DOB:  1955-12-26, LOS: 0 ADMISSION DATE:  07/28/2023, CONSULTATION DATE:  1/3 REFERRING MD:  Dr. Zackowski, CHIEF COMPLAINT:  status epilepticus   History of Present Illness:  Patient is a 68 yo M w/ pertinent PMH recent seizure on keppra , CVA w/ left sided weakness on asa/plavix , htn, t2dm, dementia presents to [facility] ED on 1/2 w/ seizure.  Patient recently hospitalized on 12/6-12/10 w/ new onset seizures. Started on keppra . Also w/ UTI and pna. Discharged to [facility].   On 1/2 patient having muscle spasms and brought in by EMS to [facility] ED. In ED had witnessed generalized tonic-clonic seizure activity and became unresponsive. Eyes deviated to left. Given ativan  and keppra . Intubated for airway protection. Started on propofol  for seizure control. Patient afebrile and bp stable. CT head no acute abnormality. CXR w/ small b/l infiltrate at bases; possible aspiration. Given vanc/rocephin . Neurology consulted. Recommended transfer to [facility] for cEEG. PCCM consulted for icu admission.  Pertinent  Medical History   Past Medical History:  Diagnosis Date   Dementia (HCC)    Diabetes mellitus without complication (HCC)    Hypertension    Stroke (HCC)      Significant Hospital Events: Including procedures, antibiotic start and stop dates in addition to other pertinent events   1/2 status epilepticus; intubated 1/3 transferred to [facility] from [facility]; pccm to admit  Interim History / Subjective:  Intubated/sedated  Objective   Blood pressure (!) 82/58, pulse 75, temperature 99 F (37.2 C), temperature source Bladder, resp. rate 20, height 5' 9 (1.753 m), weight 59.2 kg, SpO2 100%.    Vent Mode: PRVC FiO2 (%):  [60 %-100 %] 60 % Set Rate:  [20 bmp] 20 bmp Vt Set:  [560 mL] 560 mL PEEP:  [5 cmH20] 5 cmH20 Plateau Pressure:  [10 cmH20] 10 cmH20  No intake or output data in the 24 hours ending 07/29/23 0043 Filed Weights   07/28/23 2017  Weight:  59.2 kg    Examination: General:  critically ill appearing on mech vent HEENT: MM pink/moist; ETT in place Neuro: sedate; PERRL CV: s1s2, RRR, no m/r/g PULM:  dim clear BS bilaterally; on mech vent PRVC GI: soft, bsx4 active  Extremities: warm/dry, no edema  Skin: no rashes or lesions    Resolved Hospital Problem list     Assessment & Plan:   Status epilepticus Acute encephalopathy: post ictal Hx of recent seizure disorder: recently hospitalized on 12/6-12/10 w/ new onset seizures. Started on keppra  Hx of R ICA CVA w/ left sided residual weakness Hx of dementia Plan: -neuro following; appreciate recs -transfer to [facility] for cEEG -seizure precautions -AEDs per neuro -sedation per pad protocol -limit sedating meds -resume home statin and DAPT -Continue neuroprotective measures- normothermia, euglycemia, HOB greater than 30, head in neutral alignment, normocapnia, normoxia  Acute respiratory failure: intubated for airway protection due to above Possible aspiration Plan: -CXR on arrival -LTVV strategy with tidal volumes of 6-8 cc/kg ideal body weight -Wean PEEP/FiO2 for SpO2 >92% -VAP bundle in place -Daily SAT and SBT -PAD protocol in place -wean sedation for RASS goal 0 to -1 -unasyn for possible aspiration -trach aspirate  Leukocytosis Plan: -afebrile; likely reactive from seizure -trend wbc/fever curve -unasyn as above -check ua -follow cultures  HTN HLD Plan: -hold anti-hypertensives for now -resume statin  T2DM Plan: -ssi and cbg monitoring  Depression Plan: -hold paxil  and cymbalta  for now    Best Practice (right click and  Reselect all SmartList Selections daily)   Diet/type: NPO w/ meds via tube DVT prophylaxis LMWH Pressure ulcer(s): N/A GI prophylaxis: H2B Lines: N/A Foley:  Yes, and it is still needed Code Status:  full code Last date of multidisciplinary goals of care discussion [pending]  Labs   CBC: Recent Labs  Lab  07/28/23 2004  WBC 21.2*  NEUTROABS 14.7*  HGB 12.7*  HCT 41.2  MCV 97.4  PLT 277    Basic Metabolic Panel: Recent Labs  Lab 07/28/23 2004  NA 142  K 4.3  CL 110  CO2 14*  GLUCOSE 151*  BUN 17  CREATININE 1.29*  CALCIUM  9.0  MG 1.7   GFR: Estimated Creatinine Clearance: 46.5 mL/min (A) (by C-G formula based on SCr of 1.29 mg/dL (H)). Recent Labs  Lab 07/28/23 2004 07/28/23 2140  WBC 21.2*  --   LATICACIDVEN >9.0* 2.4*    Liver Function Tests: Recent Labs  Lab 07/28/23 2004  AST 27  ALT 21  ALKPHOS 92  BILITOT 0.6  PROT 7.9  ALBUMIN 3.8   No results for input(s): LIPASE, AMYLASE in the last 168 hours. No results for input(s): AMMONIA in the last 168 hours.  ABG    Component Value Date/Time   PHART 7.46 (H) 07/28/2023 2202   PCO2ART 35 07/28/2023 2202   PO2ART 331 (H) 07/28/2023 2202   HCO3 24.9 07/28/2023 2202   TCO2 26 10/04/2022 0939   ACIDBASEDEF 7.0 (H) 07/28/2023 2018   O2SAT 99.8 07/28/2023 2202     Coagulation Profile: No results for input(s): INR, PROTIME in the last 168 hours.  Cardiac Enzymes: No results for input(s): CKTOTAL, CKMB, CKMBINDEX, TROPONINI in the last 168 hours.  HbA1C: Hgb A1c MFr Bld  Date/Time Value Ref Range Status  07/01/2023 12:36 PM 5.4 4.8 - 5.6 % Final    Comment:    (NOTE) Pre diabetes:          5.7%-6.4%  Diabetes:              >6.4%  Glycemic control for   <7.0% adults with diabetes   10/03/2022 03:10 AM 5.6 4.8 - 5.6 % Final    Comment:    (NOTE)         Prediabetes: 5.7 - 6.4         Diabetes: >6.4         Glycemic control for adults with diabetes: <7.0     CBG: Recent Labs  Lab 07/28/23 1923  GLUCAP 133*    Review of Systems:   Patient is intubated; therefore, history has been obtained from chart review.    Past Medical History:  He,  has a past medical history of Dementia (HCC), Diabetes mellitus without complication (HCC), Hypertension, and Stroke (HCC).    Surgical History:   Past Surgical History:  Procedure Laterality Date   ABDOMINAL AORTOGRAM W/LOWER EXTREMITY N/A 10/04/2022   Procedure: ABDOMINAL AORTOGRAM W/LOWER EXTREMITY;  Surgeon: Eliza Lonni RAMAN, MD;  Location: [facility] INVASIVE CV LAB;  Service: Cardiovascular;  Laterality: N/A;   ABDOMINAL AORTOGRAM W/LOWER EXTREMITY N/A 10/08/2022   Procedure: ABDOMINAL AORTOGRAM W/LOWER EXTREMITY;  Surgeon: Magda Debby SAILOR, MD;  Location: [facility] INVASIVE CV LAB;  Service: Cardiovascular;  Laterality: N/A;   PERIPHERAL VASCULAR INTERVENTION  10/04/2022   Procedure: PERIPHERAL VASCULAR INTERVENTION;  Surgeon: Eliza Lonni RAMAN, MD;  Location: [facility] INVASIVE CV LAB;  Service: Cardiovascular;;   PERIPHERAL VASCULAR INTERVENTION Left 10/08/2022   Procedure: PERIPHERAL VASCULAR INTERVENTION;  Surgeon: Magda Debby SAILOR,  MD;  Location: [facility] INVASIVE CV LAB;  Service: Cardiovascular;  Laterality: Left;   TONSILLECTOMY       Social History:   reports that he has been smoking cigarettes. He does not have any smokeless tobacco history on file. He reports that he does not currently use alcohol. He reports that he does not currently use drugs.   Family History:  His family history is not on file.   Allergies Allergies  Allergen Reactions   Amoxicillin Other (See Comments)    Unknown reaction per facility   Ampicillin Other (See Comments)    Unknown reaction per facility   Tetracyclines & Related Rash     Home Medications  Prior to Admission medications   Medication Sig Start Date End Date Taking? Authorizing Provider  acetaminophen  (TYLENOL ) 325 MG tablet Take 325 mg by mouth every 6 (six) hours as needed for mild pain (pain score 1-3).    [provider]  aspirin  EC 81 MG tablet Take 81 mg by mouth daily. Swallow whole.    [provider]  atorvastatin  (LIPITOR ) 80 MG tablet Take 80 mg by mouth at bedtime. 08/14/21   [provider]  Baclofen  5 MG TABS Take 1 tablet by  mouth in the morning, at noon, and at bedtime. 06/16/23   [provider]  clopidogrel  (PLAVIX ) 75 MG tablet Take 1 tablet (75 mg total) by mouth daily with breakfast. 10/11/22   Dennise Lavada POUR, MD  DULoxetine  (CYMBALTA ) 30 MG capsule Take 30 mg by mouth daily. 07/23/22   [provider]  guaiFENesin (MUCINEX MAXIMUM STRENGTH PO) Take 600 mg by mouth every 12 (twelve) hours as needed (cough/congestion). 08/10/22   [provider]  levETIRAcetam  (KEPPRA ) 500 MG tablet Take 500 mg by mouth 2 (two) times daily. 07/23/22   [provider]  melatonin 3 MG TABS tablet Take 2 tablets (6 mg total) by mouth at bedtime. 03/03/23   Pearlean Manus, MD  Menthol, Topical Analgesic, (BIOFREEZE EX) Apply 1 Application topically at bedtime.    [provider]  metoprolol  tartrate (LOPRESSOR ) 25 MG tablet Take 1 tablet (25 mg total) by mouth 2 (two) times daily. 10/11/22   Singh, Prashant K, MD  Multiple Vitamin (MULTIVITAMIN) tablet Take 1 tablet by mouth daily.    [provider]  pantoprazole  (PROTONIX ) 40 MG tablet Take 1 tablet (40 mg total) by mouth daily. 10/11/22   Singh, Prashant K, MD  PARoxetine  (PAXIL ) 40 MG tablet Take 40 mg by mouth every morning. 06/26/23   [provider]  tamsulosin  (FLOMAX ) 0.4 MG CAPS capsule Take 1 capsule (0.4 mg total) by mouth daily. 10/11/22   Dennise Lavada POUR, MD     Critical care time: 45 minutes     JD Emilio DEVONNA [facility] 07/29/2023, 12:43 AM  Please see [email or web address] for pager details.  From 7A-7P if no response, please call [phone]. After hours, please call ELink [phone].

## 2023-07-29 NOTE — Progress Notes (Signed)
MB performed maintenance on electrodes. All impedances are below 10k ohms. No skin breakdown noted.  

## 2023-07-29 NOTE — Progress Notes (Addendum)
 eLink Physician-Brief Progress Note Patient Name: Jacob Mcgrath. DOB: 03/19/1956 MRN: [id number]   Date of Service  07/29/2023  HPI/Events of Note  Patient is a difficult stick, unable to obtain labs via phlebotomy.    eICU Interventions  Discontinue frequent labs as the patient is clinically improved  Assess for midline catheter placement   2147 -  unable to get midline, defer picc line. If phlebotomy unable to get labs, will get aline  Intervention Category Minor Interventions: Routine modifications to care plan (e.g. PRN medications for pain, fever)  Evin Chirco 07/29/2023, 8:02 PM

## 2023-07-29 NOTE — Progress Notes (Addendum)
 VAST consult regarding midline placement. Notified nurse,Mark Sibenge RN, that midline is not appropriate d/t medications ordered. RN VU. Tomasita Morrow, RN VAST

## 2023-07-29 NOTE — Progress Notes (Signed)
 Report called to RT covering 30M at [facility].

## 2023-07-30 DIAGNOSIS — G40901 Epilepsy, unspecified, not intractable, with status epilepticus: Secondary | ICD-10-CM | POA: Diagnosis not present

## 2023-07-30 LAB — CBC
HCT: 37.5 % — ABNORMAL LOW (ref 39.0–52.0)
Hemoglobin: 12.1 g/dL — ABNORMAL LOW (ref 13.0–17.0)
MCH: 29.4 pg (ref 26.0–34.0)
MCHC: 32.3 g/dL (ref 30.0–36.0)
MCV: 91.2 fL (ref 80.0–100.0)
Platelets: 227 10*3/uL (ref 150–400)
RBC: 4.11 MIL/uL — ABNORMAL LOW (ref 4.22–5.81)
RDW: 13.6 % (ref 11.5–15.5)
WBC: 13.8 10*3/uL — ABNORMAL HIGH (ref 4.0–10.5)
nRBC: 0 % (ref 0.0–0.2)

## 2023-07-30 LAB — GLUCOSE, CAPILLARY
Glucose-Capillary: 75 mg/dL (ref 70–99)
Glucose-Capillary: 148 mg/dL — ABNORMAL HIGH (ref 70–99)
Glucose-Capillary: 78 mg/dL (ref 70–99)
Glucose-Capillary: 63 mg/dL — ABNORMAL LOW (ref 70–99)
Glucose-Capillary: 108 mg/dL — ABNORMAL HIGH (ref 70–99)
Glucose-Capillary: 88 mg/dL (ref 70–99)

## 2023-07-30 LAB — BASIC METABOLIC PANEL
Anion gap: 13 (ref 5–15)
BUN: 11 mg/dL (ref 8–23)
CO2: 21 mmol/L — ABNORMAL LOW (ref 22–32)
Calcium: 9 mg/dL (ref 8.9–10.3)
Chloride: 110 mmol/L (ref 98–111)
Creatinine, Ser: 0.94 mg/dL (ref 0.61–1.24)
GFR, Estimated: >60 mL/min (ref >60)
Glucose, Bld: 139 mg/dL — ABNORMAL HIGH (ref 70–99)
Potassium: 3.9 mmol/L (ref 3.5–5.1)
Sodium: 144 mmol/L (ref 135–145)

## 2023-07-30 LAB — MAGNESIUM: Magnesium: 1.8 mg/dL (ref 1.7–2.4)

## 2023-07-30 LAB — TRIGLYCERIDES: Triglycerides: 56 mg/dL (ref <150)

## 2023-07-30 LAB — PROCALCITONIN: Procalcitonin: 1.93 ng/mL

## 2023-07-30 MED ORDER — ACETAMINOPHEN 650 MG RE SUPP
RECTAL | Status: AC
  Filled 2023-07-30: qty 1

## 2023-07-30 MED ORDER — LABETALOL HCL 5 MG/ML IV SOLN
10.0000 mg | INTRAVENOUS | Status: DC | PRN
  Administered 2023-07-30 – 2023-08-02 (×6): 10 mg via INTRAVENOUS
  Filled 2023-07-30 (×7): qty 4

## 2023-07-30 MED ORDER — DEXTROSE 50 % IV SOLN
1.0000 | Freq: Once | INTRAVENOUS | Status: AC
  Administered 2023-07-30: 50 mL via INTRAVENOUS

## 2023-07-30 MED ORDER — DEXTROSE 50 % IV SOLN
INTRAVENOUS | Status: AC
  Filled 2023-07-30: qty 50

## 2023-07-30 MED ORDER — ACETAMINOPHEN 650 MG RE SUPP
650.0000 mg | Freq: Four times a day (QID) | RECTAL | Status: DC | PRN
  Administered 2023-07-30: 650 mg via RECTAL

## 2023-07-30 NOTE — Progress Notes (Signed)
 NAME:  Jacob Vanderburg., MRN:  [id number], DOB:  1955/10/21, LOS: 1 ADMISSION DATE:  07/28/2023, CONSULTATION DATE:  1/3 REFERRING MD:  Dr. Zackowski, CHIEF COMPLAINT:  status epilepticus   History of Present Illness:  Patient is a 68 yo M w/ pertinent PMH recent seizure on keppra , CVA w/ left sided weakness on asa/plavix , htn, t2dm, dementia presents to [facility] ED on 1/2 w/ seizure.  Patient recently hospitalized on 12/6-12/10 w/ new onset seizures. Started on keppra . Also w/ UTI and pna. Discharged to [facility].   On 1/2 patient having muscle spasms and brought in by EMS to [facility] ED. In ED had witnessed generalized tonic-clonic seizure activity and became unresponsive. Eyes deviated to left. Given ativan  and keppra . Intubated for airway protection. Started on propofol  for seizure control. Patient afebrile and bp stable. CT head no acute abnormality. CXR w/ small b/l infiltrate at bases; possible aspiration. Given vanc/rocephin . Neurology consulted. Recommended transfer to [facility] for cEEG. PCCM consulted for icu admission.  Pertinent  Medical History   Past Medical History:  Diagnosis Date   Dementia (HCC)    Diabetes mellitus without complication (HCC)    Hypertension    Stroke (HCC)    Significant Hospital Events: Including procedures, antibiotic start and stop dates in addition to other pertinent events   1/2 status epilepticus; intubated 1/3 transferred to [facility] from [facility]; pccm to admit 1/4 no further seizures off sedation.  Interim History / Subjective:  No further seizures noted.  Tolerating SBT.  Objective   Blood pressure 122/87, pulse (!) 112, temperature (!) 100.8 F (38.2 C), resp. rate 18, height 5' 9 (1.753 m), weight 61.4 kg, SpO2 100%.    Vent Mode: PSV;CPAP FiO2 (%):  [40 %] 40 % Set Rate:  [18 bmp] 18 bmp Vt Set:  [560 mL] 560 mL PEEP:  [5 cmH20] 5 cmH20 Pressure Support:  [5 cmH20-14 cmH20] 5 cmH20 Plateau Pressure:  [21 cmH20-26 cmH20] 26 cmH20    Intake/Output Summary (Last 24 hours) at 07/30/2023 1031 Last data filed at 07/30/2023 0905 Gross per 24 hour  Intake 1736.1 ml  Output 1535 ml  Net 201.1 ml   Filed Weights   07/28/23 2017 07/29/23 0404 07/30/23 0200  Weight: 59.2 kg 59.9 kg 61.4 kg    Examination: General: Thin chronically ill-appearing man. HEENT: ET tube and OG tube in place without pressure injury. Neuro: Off sedation.  Will move purposefully but has not followed commands. CV: Normal heart sounds. PULM: Clear to auscultation bilaterally. GI: Soft nontender. Extremities: warm/dry, no edema   Continuous EEG reviewed and shows increasingly wakeful pattern.  Ancillary test personally reviewed:   Creatinine normal at 0.94.  Electrolytes normal.  Normal BUN. Mild leukocytosis 13.8.  Assessment & Plan:   Status epilepticus Acute encephalopathy due to postictal state. Hx of recent seizure disorder in the context of prior CVA and dementia. Hx of R ICA CVA w/ left sided residual weakness Hx of dementia Mechanical ventilation for airway protection. Possible aspiration HTN HLD T2DM Depression  Plan:  -No further seizures.  Following commands on SBT.  Ready for extubation. -Continue Keppra  at current dose. -Complete 5 days of antibiotics for aspiration pneumonia. -Sliding scale insulin .  Best Practice (right click and Reselect all SmartList Selections daily)   Diet/type: NPO w/ meds via tube-swallow evaluation once extubated. DVT prophylaxis LMWH Pressure ulcer(s): N/A GI prophylaxis: H2B Lines: N/A Foley:  Yes, and it is still needed Code Status:  full code Last date of multidisciplinary  goals of care discussion [pending]  CRITICAL CARE Performed by: Fredia Alderton   Total critical care time: 35 minutes  Critical care time was exclusive of separately billable procedures and treating other patients.  Critical care was necessary to treat or prevent imminent or life-threatening  deterioration.  Critical care was time spent personally by me on the following activities: development of treatment plan with patient and/or surrogate as well as nursing, discussions with consultants, evaluation of patient's response to treatment, examination of patient, obtaining history from patient or surrogate, ordering and performing treatments and interventions, ordering and review of laboratory studies, ordering and review of radiographic studies, pulse oximetry, re-evaluation of patient's condition and participation in multidisciplinary rounds.  Fredia Alderton, MD [facility] ICU Physician [facility] Critical Care  Pager: [phone] Mobile: [phone] After hours: [phone].

## 2023-07-30 NOTE — Progress Notes (Signed)
 NEUROLOGY CONSULT FOLLOW UP NOTE   Date of service: July 30, 2023 Patient Name: Jacob Mcgrath. MRN:  [id number] DOB:  Nov 01, 1955  Brief HPI  Jacob Muaaz Brau. is a 68 y.o. male  has a past medical history of Dementia (HCC), Diabetes mellitus without complication (HCC), Hypertension, and Stroke (HCC). who presented with generalized tonic-clonic seizure activity.  He was 3 doses of 1 mg of Ativan  and loaded on Keppra .  He was then intubated for airway protection and remains on propofol .  Was found to have sepsis and was started on vancomycin  and ceftriaxone .  He was then transferred here for long-term EEG.  He was receiving his scheduled Keppra  at his facility.  LTM EEG negative, Keppra  increased to 750 BID.   Interval Hx/subjective   Extubated.  Vitals   Vitals:   07/30/23 1129 07/30/23 1200 07/30/23 1300 07/30/23 1400  BP: 131/69 (!) 154/68 (!) 180/74 (!) 168/79  Pulse: (!) 122 (!) 118 (!) 113   Resp: (!) 24 (!) 24 18 (!) 21  Temp: (!) 100.6 F (38.1 C) (!) 100.4 F (38 C) (!) 100.4 F (38 C) 100.2 F (37.9 C)  TempSrc:  Bladder    SpO2: 98% 95% 100%   Weight:      Height:         Body mass index is 19.99 kg/m.  Physical Exam   General: Laying comfortably in bed; in no acute distress.  HENT: Normal oropharynx and mucosa. Normal external appearance of ears and nose.  Neck: Supple, no pain or tenderness  CV: No JVD. No peripheral edema.  Pulmonary: Symmetric Chest rise. Normal respiratory effort.  Abdomen: Soft to touch, non-tender.  Ext: No cyanosis, edema, or deformity  Skin: No rash. Normal palpation of skin.   Musculoskeletal: Normal digits and nails by inspection. No clubbing.   Neurologic Examination  Mental status/Cognition: Alert, oriented to self, place, but not to month and year, poor attention. Speech/language: dysarthric speech,  non fluent and bradyphrenic. Object naming intact. Cranial nerves:   CN II Pupils equal and reactive to light, no VF  deficits    CN III,IV,VI EOM intact, no gaze preference or deviation, no nystagmus    CN V normal sensation in V1, V2, and V3 segments bilaterally    CN VII L facail droop   CN VIII normal hearing to speech    CN IX & X normal palatal elevation, no uvular deviation    CN XI 5/5 head turn and 5/5 shoulder shrug bilaterally    CN XII midline tongue protrusion    Motor:  Muscle bulk: poor Baseline L sided weakness from prior stroke. Moves RUE and RLE spontaneously and antigravity.  Coordination/Complex Motor:  - Finger to Nose intact on the right.  Medications  Current Facility-Administered Medications:    acetaminophen  (TYLENOL ) tablet 650 mg, 650 mg, Per Tube, Q4H PRN, Payne, John D, PA-C, 650 mg at 07/30/23 9165   aspirin  chewable tablet 81 mg, 81 mg, Per Tube, Daily, Payne, John D, PA-C, 81 mg at 07/30/23 9164   atorvastatin  (LIPITOR ) tablet 80 mg, 80 mg, Per Tube, QHS, Payne, John D, PA-C   cefTRIAXone  (ROCEPHIN ) 2 g in sodium chloride  0.9 % 100 mL IVPB, 2 g, Intravenous, Q24H, Agarwala, Ravi, MD, Stopped at 07/30/23 0957   Chlorhexidine  Gluconate Cloth 2 % PADS 6 each, 6 each, Topical, Q0600, Kara Dorn NOVAK, MD, 6 each at 07/30/23 0500   clopidogrel  (PLAVIX ) tablet 75 mg, 75 mg, Per Tube, Q breakfast, Emilio,  John D, PA-C, 75 mg at 07/30/23 0834   docusate (COLACE) 50 MG/5ML liquid 100 mg, 100 mg, Per Tube, BID PRN, Emilio Norleen BIRCH, PA-C   enoxaparin  (LOVENOX ) injection 40 mg, 40 mg, Subcutaneous, Q24H, Payne, John D, PA-C, 40 mg at 07/30/23 9165   feeding supplement (OSMOLITE 1.5 CAL) liquid 1,000 mL, 1,000 mL, Per Tube, Continuous, Agarwala, Ravi, MD, Stopped at 07/30/23 0904   feeding supplement (PROSource TF20) liquid 60 mL, 60 mL, Per Tube, Daily, Agarwala, Ravi, MD, 60 mL at 07/30/23 0834   fentaNYL  (SUBLIMAZE ) injection 50 mcg, 50 mcg, Intravenous, Once, Zackowski, Scott, MD   insulin  aspart (novoLOG ) injection 0-9 Units, 0-9 Units, Subcutaneous, Q4H, Payne, John D, PA-C, 1  Units at 07/30/23 9163   labetalol  (NORMODYNE ) injection 10 mg, 10 mg, Intravenous, Q2H PRN, Agarwala, Fredia, MD, 10 mg at 07/30/23 1458   levETIRAcetam  (KEPPRA ) 750 mg in sodium chloride  0.9 % 100 mL IVPB, 750 mg, Intravenous, Q12H, Bhagat, Srishti L, MD, Last Rate: 430 mL/hr at 07/30/23 0846, 750 mg at 07/30/23 0846   multivitamin with minerals tablet 1 tablet, 1 tablet, Per Tube, Daily, Agarwala, Fredia, MD, 1 tablet at 07/30/23 9164   norepinephrine  (LEVOPHED ) 4mg  in (0.016 mg/mL) premix infusion, 2-10 mcg/min, Intravenous, Titrated, Paliwal, Aditya, MD, Stopped at 07/30/23 0900   Oral care mouth rinse, 15 mL, Mouth Rinse, Q2H, Kara Carrier B, MD, 15 mL at 07/30/23 1332   Oral care mouth rinse, 15 mL, Mouth Rinse, PRN, Kara Carrier NOVAK, MD   polyethylene glycol (MIRALAX  / GLYCOLAX ) packet 17 g, 17 g, Per Tube, Daily PRN, Emilio Norleen BIRCH, PA-C   thiamine  (VITAMIN B1) tablet 100 mg, 100 mg, Per Tube, Daily, Agarwala, Ravi, MD, 100 mg at 07/30/23 0835 Labs and Diagnostic Imaging   CBC:  Recent Labs  Lab 07/28/23 2004 07/30/23 0028  WBC 21.2* 13.8*  NEUTROABS 14.7*  --   HGB 12.7* 12.1*  HCT 41.2 37.5*  MCV 97.4 91.2  PLT 277 227    Basic Metabolic Panel:  Lab Results  Component Value Date   NA 144 07/30/2023   K 3.9 07/30/2023   CO2 21 (L) 07/30/2023   GLUCOSE 139 (H) 07/30/2023   BUN 11 07/30/2023   CREATININE 0.94 07/30/2023   CALCIUM  9.0 07/30/2023   GFRNONAA >60 07/30/2023   Lipid Panel:  Lab Results  Component Value Date   LDLCALC 40 10/05/2022   HgbA1c:  Lab Results  Component Value Date   HGBA1C 5.4 07/01/2023   Urine Drug Screen:     Component Value Date/Time   LABOPIA NONE DETECTED 07/01/2023 1127   COCAINSCRNUR NONE DETECTED 07/01/2023 1127   LABBENZ NONE DETECTED 07/01/2023 1127   AMPHETMU NONE DETECTED 07/01/2023 1127   THCU NONE DETECTED 07/01/2023 1127   LABBARB NONE DETECTED 07/01/2023 1127    Alcohol Level     Component Value Date/Time    ETH <10 07/01/2023 1236   INR  Lab Results  Component Value Date   INR 1.3 (H) 07/02/2023   APTT  Lab Results  Component Value Date   APTT 32 07/02/2023   AED levels: No results found for: PHENYTOIN, ZONISAMIDE, LAMOTRIGINE, LEVETIRACETA  CT Head without contrast(Personally reviewed): No acute abnormality, old right MCA territory stroke  rEEG:  This study is suggestive of cortical dysfunction in right hemisphere likely secondary to underlying encephalomalacia. Additionally there is moderate to severe diffuse encephalopathy, likely related to sedation. No seizures or epileptiform discharges were seen throughout the recording.  Assessment   Jacob Kelly Eisler. is a 68 y.o. 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  Patient was given Ativan  and loaded with Keppra , then intubated for airway protection.  It does not appear that he missed any doses of his Keppra  while at his facility, and Keppra  was increased here to 750 mg twice daily  Long-term EEG demonstrates no seizure activity and he was weaned off propofol  and extubated and no cseizures.  Recommendations  - Continue Keppra  750 mg twice daily, this is maximum dose given his renal function - Continue seizure precautions -- We will signoff. Please feel free to contact us  with any questions or concerns. ______________________________________________________________________  Jacob Goodlow, MD [facility] [phone]   If 7pm to 7am, please call on call as listed on AMION.

## 2023-07-30 NOTE — Procedures (Addendum)
 Patient Name: Jacob Mcgrath.  MRN: [id number]  Epilepsy Attending: Arlin MALVA Krebs  Referring Physician/Provider: Jerrie Lola CROME, MD   Duration: 07/30/2023 9470 to 07/30/2023 1645   Patient history:  68 y.o. male with breakthrough seizure with no clear triggering etiology. EEG to evaluate for seizure.   Level of alertness:  comatose   AEDs during EEG study: Propofol , LEV   Technical aspects: This EEG study was done with scalp electrodes positioned according to the 10-20 International system of electrode placement. Electrical activity was reviewed with band pass filter of 1-70Hz , sensitivity of 7 uV/mm, display speed of 45mm/sec with a 60Hz  notched filter applied as appropriate. EEG data were recorded continuously and digitally stored.  Video monitoring was available and reviewed as appropriate.   Description: EEG showed continuous generalized and lateralized right hemisphere 3 to 6 Hz theta- delta slowing admixed with 12 to 13 Hz beta activity.  Hyperventilation and photic stimulation were not performed.      ABNORMALITY - Continuous slow, generalized and lateralized right hemisphere   IMPRESSION: This study is suggestive of cortical dysfunction in right hemisphere likely secondary to underlying encephalomalacia.  Additionally there is moderate to severe diffuse encephalopathy, likely related to sedation.  No seizures or epileptiform discharges were seen throughout the recording.   Lottie Sigman O Cashton Hosley

## 2023-07-30 NOTE — Progress Notes (Signed)
 LTM EEG discontinued - no skin breakdown at [facility].

## 2023-07-30 NOTE — Evaluation (Signed)
 Clinical/Bedside Swallow Evaluation Patient Details  Name: Jacob Mcgrath. MRN: [id number] Date of Birth: 1956-03-26  Today's Date: 07/30/2023 Time: SLP Start Time (ACUTE ONLY): 1529 SLP Stop Time (ACUTE ONLY): 1550 SLP Time Calculation (min) (ACUTE ONLY): 21 min  Past Medical History:  Past Medical History:  Diagnosis Date   Dementia (HCC)    Diabetes mellitus without complication (HCC)    Hypertension    Stroke [facility])    Past Surgical History:  Past Surgical History:  Procedure Laterality Date   ABDOMINAL AORTOGRAM W/LOWER EXTREMITY N/A 10/04/2022   Procedure: ABDOMINAL AORTOGRAM W/LOWER EXTREMITY;  Surgeon: Eliza Lonni RAMAN, MD;  Location: [facility] INVASIVE CV LAB;  Service: Cardiovascular;  Laterality: N/A;   ABDOMINAL AORTOGRAM W/LOWER EXTREMITY N/A 10/08/2022   Procedure: ABDOMINAL AORTOGRAM W/LOWER EXTREMITY;  Surgeon: Magda Debby SAILOR, MD;  Location: [facility] INVASIVE CV LAB;  Service: Cardiovascular;  Laterality: N/A;   PERIPHERAL VASCULAR INTERVENTION  10/04/2022   Procedure: PERIPHERAL VASCULAR INTERVENTION;  Surgeon: Eliza Lonni RAMAN, MD;  Location: [facility] INVASIVE CV LAB;  Service: Cardiovascular;;   PERIPHERAL VASCULAR INTERVENTION Left 10/08/2022   Procedure: PERIPHERAL VASCULAR INTERVENTION;  Surgeon: Magda Debby SAILOR, MD;  Location: [facility] INVASIVE CV LAB;  Service: Cardiovascular;  Laterality: Left;   TONSILLECTOMY     HPI:  68 year old male with recent seizure on keppra , CVA w/ left sided weakness, HTN, Type2 diabetes mellitus, and dementia presents to [facility] ED on 1/2 w/ seizure. ETT 1/2-1/4. Chest x ray with small bilateral infiltrates at the bases; possibe aspiration.    Assessment / Plan / Recommendation  Clinical Impression  Pt presents with suspected component of pharyngeal dysphagia likely exacerbated by recent intubation and pt deconditioning. Pt alert, though with weaker phonation, as expected post extubation. Provided oral care. Pt with congested weaker cough at  baseline. He did exhibit isolated cough with wet vocal quality following single ice chips in 2/3 trials concerning for compromised airway protection. Pt with delayed cough x1 following 1/2 tsp puree in 1/4 trials. Recommend any urgent meds crushed in puree with oral care QID and continued NPO. Will follow up next date for PO readiness and likely need for future instrumental swallow study when pt exhibits readiness.  SLP Visit Diagnosis: Dysphagia, unspecified (R13.10)    Aspiration Risk  Moderate aspiration risk;Risk for inadequate nutrition/hydration    Diet Recommendation   NPO  Medication Administration: Other (Comment) (may attempt meds crushed in puree)    Other  Recommendations Oral Care Recommendations: Oral care QID    Recommendations for follow up therapy are one component of a multi-disciplinary discharge planning process, led by the attending physician.  Recommendations may be updated based on patient status, additional functional criteria and insurance authorization.  Follow up Recommendations Follow physician's recommendations for discharge plan and follow up therapies      Assistance Recommended at Discharge    Functional Status Assessment Patient has had a recent decline in their functional status and/or demonstrates limited ability to make significant improvements in function in a reasonable and predictable amount of time  Frequency and Duration min 2x/week  2 weeks       Prognosis Prognosis for improved oropharyngeal function: Fair Barriers to Reach Goals: Time post onset;Cognitive deficits      Swallow Study   General Date of Onset: 07/28/23 HPI: 68 year old male with recent seizure on keppra , CVA w/ left sided weakness, HTN, Type2 diabetes mellitus, and dementia presents to [facility] ED on 1/2 w/ seizure. ETT 1/2-1/4. Chest x  ray with small bilateral infiltrates at the bases; possibe aspiration. Type of Study: Bedside Swallow Evaluation Previous Swallow Assessment: BSE  06/2023; diet recs puree, thins Diet Prior to this Study: NPO Temperature Spikes Noted: No Respiratory Status: Nasal cannula History of Recent Intubation: Yes Total duration of intubation (days): 2 days Date extubated: 07/30/23 Behavior/Cognition: Alert;Requires cueing Oral Cavity Assessment: Within Functional Limits Oral Care Completed by SLP: Yes Oral Cavity - Dentition: Edentulous (possible dentures at baseline per pt) Vision: Functional for self-feeding Self-Feeding Abilities: Total assist Patient Positioning: Upright in bed Baseline Vocal Quality: Low vocal intensity Volitional Cough: Congested;Weak Volitional Swallow: Unable to elicit    Oral/Motor/Sensory Function Overall Oral Motor/Sensory Function: Generalized oral weakness   Ice Chips Ice chips: Impaired Presentation: Spoon Oral Phase Impairments: Reduced lingual movement/coordination Oral Phase Functional Implications: Prolonged oral transit Pharyngeal Phase Impairments: Suspected delayed Swallow;Multiple swallows;Cough - Delayed;Wet Vocal Quality   Thin Liquid Thin Liquid: Not tested    Nectar Thick Nectar Thick Liquid: Not tested   Honey Thick Honey Thick Liquid: Not tested   Puree Puree: Impaired Presentation: Spoon Pharyngeal Phase Impairments: Suspected delayed Swallow;Multiple swallows;Cough - Delayed   Solid     Solid: Not tested     Mitzie HUNT MA, CCC-SLP Acute Rehabilitation Services   07/30/2023,4:03 PM

## 2023-07-30 NOTE — Procedures (Signed)
 Extubation Procedure Note  Patient Details:   Name: Jacob Mcgrath. DOB: 05-Oct-1955 MRN: [id number]   Airway Documentation:    Vent end date: 07/30/23 Vent end time: 1131   Evaluation  O2 sats: stable throughout Complications: No apparent complications Patient did tolerate procedure well. Bilateral Breath Sounds: Diminished, Clear   Yes, Patient able to vocalize post extubation to 4L [place]. Cuff leak positive.   Alto Dwayne Browning 07/30/2023, 11:33 AM

## 2023-07-31 DIAGNOSIS — G40901 Epilepsy, unspecified, not intractable, with status epilepticus: Secondary | ICD-10-CM | POA: Diagnosis not present

## 2023-07-31 LAB — BASIC METABOLIC PANEL
Anion gap: 12 (ref 5–15)
BUN: 10 mg/dL (ref 8–23)
CO2: 22 mmol/L (ref 22–32)
Calcium: 9 mg/dL (ref 8.9–10.3)
Chloride: 107 mmol/L (ref 98–111)
Creatinine, Ser: 0.88 mg/dL (ref 0.61–1.24)
GFR, Estimated: >60 mL/min (ref >60)
Glucose, Bld: 97 mg/dL (ref 70–99)
Potassium: 3.4 mmol/L — ABNORMAL LOW (ref 3.5–5.1)
Sodium: 141 mmol/L (ref 135–145)

## 2023-07-31 LAB — GLUCOSE, CAPILLARY
Glucose-Capillary: 65 mg/dL — ABNORMAL LOW (ref 70–99)
Glucose-Capillary: 151 mg/dL — ABNORMAL HIGH (ref 70–99)
Glucose-Capillary: 33 mg/dL — CL (ref 70–99)
Glucose-Capillary: 79 mg/dL (ref 70–99)
Glucose-Capillary: 95 mg/dL (ref 70–99)
Glucose-Capillary: 103 mg/dL — ABNORMAL HIGH (ref 70–99)
Glucose-Capillary: 17 mg/dL — CL (ref 70–99)
Glucose-Capillary: 46 mg/dL — ABNORMAL LOW (ref 70–99)
Glucose-Capillary: 124 mg/dL — ABNORMAL HIGH (ref 70–99)

## 2023-07-31 LAB — CBC
HCT: 34.1 % — ABNORMAL LOW (ref 39.0–52.0)
Hemoglobin: 11 g/dL — ABNORMAL LOW (ref 13.0–17.0)
MCH: 29.6 pg (ref 26.0–34.0)
MCHC: 32.3 g/dL (ref 30.0–36.0)
MCV: 91.7 fL (ref 80.0–100.0)
Platelets: 185 10*3/uL (ref 150–400)
RBC: 3.72 MIL/uL — ABNORMAL LOW (ref 4.22–5.81)
RDW: 13.5 % (ref 11.5–15.5)
WBC: 12.3 10*3/uL — ABNORMAL HIGH (ref 4.0–10.5)
nRBC: 0 % (ref 0.0–0.2)

## 2023-07-31 LAB — PROCALCITONIN: Procalcitonin: 0.94 ng/mL

## 2023-07-31 LAB — MAGNESIUM: Magnesium: 1.7 mg/dL (ref 1.7–2.4)

## 2023-07-31 LAB — PHOSPHORUS: Phosphorus: 2.7 mg/dL (ref 2.5–4.6)

## 2023-07-31 MED ORDER — DEXTROSE 50 % IV SOLN: 12.5000 g | Freq: Once | INTRAVENOUS | Status: AC

## 2023-07-31 MED ORDER — CLOPIDOGREL BISULFATE 75 MG PO TABS
75.0000 mg | ORAL_TABLET | Freq: Every day | ORAL | Status: DC
  Administered 2023-07-31 – 2023-08-04 (×5): 75 mg via ORAL
  Filled 2023-07-31 (×5): qty 1

## 2023-07-31 MED ORDER — LEVETIRACETAM 750 MG PO TABS
750.0000 mg | ORAL_TABLET | Freq: Two times a day (BID) | ORAL | Status: DC
  Administered 2023-07-31 – 2023-08-04 (×9): 750 mg via ORAL
  Filled 2023-07-31 (×9): qty 1

## 2023-07-31 MED ORDER — POTASSIUM CHLORIDE 10 MEQ/100ML IV SOLN
10.0000 meq | INTRAVENOUS | Status: DC
  Administered 2023-07-31 (×4): 10 meq via INTRAVENOUS
  Filled 2023-07-31 (×4): qty 100

## 2023-07-31 MED ORDER — INSULIN ASPART 100 UNIT/ML IJ SOLN: 0.0000 [IU] | Freq: Every day | INTRAMUSCULAR | Status: DC

## 2023-07-31 MED ORDER — ASPIRIN 81 MG PO CHEW
81.0000 mg | CHEWABLE_TABLET | Freq: Every day | ORAL | Status: DC
  Administered 2023-07-31 – 2023-08-04 (×5): 81 mg via ORAL
  Filled 2023-07-31 (×5): qty 1

## 2023-07-31 MED ORDER — INSULIN ASPART 100 UNIT/ML IJ SOLN: 0.0000 [IU] | Freq: Three times a day (TID) | INTRAMUSCULAR | Status: DC

## 2023-07-31 MED ORDER — THIAMINE MONONITRATE 100 MG PO TABS
100.0000 mg | ORAL_TABLET | Freq: Every day | ORAL | Status: AC
  Administered 2023-07-31 – 2023-08-02 (×3): 100 mg via ORAL
  Filled 2023-07-31 (×3): qty 1

## 2023-07-31 MED ORDER — DOCUSATE SODIUM 100 MG PO CAPS: 100.0000 mg | ORAL_CAPSULE | Freq: Two times a day (BID) | ORAL | Status: DC | PRN

## 2023-07-31 MED ORDER — ADULT MULTIVITAMIN W/MINERALS CH
1.0000 | ORAL_TABLET | Freq: Every day | ORAL | Status: DC
  Administered 2023-07-31 – 2023-08-04 (×5): 1 via ORAL
  Filled 2023-07-31 (×5): qty 1

## 2023-07-31 MED ORDER — POLYETHYLENE GLYCOL 3350 17 G PO PACK: 17.0000 g | PACK | Freq: Every day | ORAL | Status: DC | PRN

## 2023-07-31 MED ORDER — ATORVASTATIN CALCIUM 80 MG PO TABS
80.0000 mg | ORAL_TABLET | Freq: Every day | ORAL | Status: DC
  Administered 2023-07-31 – 2023-08-03 (×4): 80 mg via ORAL
  Filled 2023-07-31: qty 1
  Filled 2023-07-31: qty 2
  Filled 2023-07-31 (×2): qty 1

## 2023-07-31 MED ORDER — DEXTROSE 50 % IV SOLN
INTRAVENOUS | Status: AC
  Administered 2023-07-31: 12.5 g via INTRAVENOUS
  Filled 2023-07-31: qty 50

## 2023-07-31 MED ORDER — LEVETIRACETAM 750 MG PO TABS
750.0000 mg | ORAL_TABLET | Freq: Two times a day (BID) | ORAL | Status: DC
  Filled 2023-07-31: qty 1

## 2023-07-31 MED ORDER — MAGNESIUM SULFATE 2 GM/50ML IV SOLN
2.0000 g | Freq: Once | INTRAVENOUS | Status: AC
  Administered 2023-07-31: 2 g via INTRAVENOUS
  Filled 2023-07-31: qty 50

## 2023-07-31 MED ORDER — METOPROLOL TARTRATE 25 MG PO TABS
25.0000 mg | ORAL_TABLET | Freq: Two times a day (BID) | ORAL | Status: DC
  Administered 2023-07-31 – 2023-08-04 (×9): 25 mg via ORAL
  Filled 2023-07-31 (×9): qty 1

## 2023-07-31 MED ORDER — ACETAMINOPHEN 325 MG PO TABS
650.0000 mg | ORAL_TABLET | ORAL | Status: DC | PRN
Start: 2023-07-31 — End: 2023-08-04

## 2023-07-31 NOTE — Plan of Care (Signed)
  Problem: Education: Goal: Knowledge of General Education information will improve Description: Including pain rating scale, medication(s)/side effects and non-pharmacologic comfort measures Outcome: Progressing   Problem: Clinical Measurements: Goal: Ability to maintain clinical measurements within normal limits will improve Outcome: Progressing Goal: Will remain free from infection Outcome: Progressing Goal: Respiratory complications will improve Outcome: Progressing Goal: Cardiovascular complication will be avoided Outcome: Progressing   Problem: Nutrition: Goal: Adequate nutrition will be maintained Outcome: Progressing   Problem: Coping: Goal: Level of anxiety will decrease Outcome: Progressing   Problem: Elimination: Goal: Will not experience complications related to urinary retention Outcome: Progressing   Problem: Pain Management: Goal: General experience of comfort will improve Outcome: Progressing   Problem: Safety: Goal: Ability to remain free from injury will improve Outcome: Progressing

## 2023-07-31 NOTE — Progress Notes (Addendum)
 Speech Language Pathology Treatment: Dysphagia  Patient Details Name: Jacob Mcgrath. MRN: [id number] DOB: 04/06/1956 Today's Date: 07/31/2023 Time: 9149-9097 SLP Time Calculation (min) (ACUTE ONLY): 12 min  Assessment / Plan / Recommendation Clinical Impression  Pt was seen for skilled ST to assess for diet readiness.  Pt was encountered awake/alert in bed and was agreeable to session.  He was seen with trials of ice chips, thin liquid, puree, and regular solids.  Pt exhibited an immediate cough with regular solids.  Of note, pt is edentulous and stated that he wears dentures at baseline when consuming solids, but dentures are not currently present.  Pt exhibited a delayed throat clear with thin liquid in 1/10 trials, otherwise he tolerated thin liquid and puree well.  This is an improvement from clinical swallow evaluation yesterday (1/4).  Recommend diet initiation of Dysphagia 1 (puree) solids and thin liquids with medication administered crushed in puree and strict adherence to aspiration precautions.  SLP will f/u to monitor diet tolerance.    HPI HPI: 68 year old male with recent seizure on keppra , CVA w/ left sided weakness, HTN, Type2 diabetes mellitus, and dementia presents to [facility] ED on 1/2 w/ seizure. ETT 1/2-1/4. Chest x ray with small bilateral infiltrates at the bases; possibe aspiration.      SLP Plan  Continue with current plan of care      Recommendations for follow up therapy are one component of a multi-disciplinary discharge planning process, led by the attending physician.  Recommendations may be updated based on patient status, additional functional criteria and insurance authorization.    Recommendations  Diet recommendations: Thin liquid;Dysphagia 1 (puree) Liquids provided via: Cup;No straw Medication Administration: Crushed with puree Supervision: Staff to assist with self feeding;Intermittent supervision to cue for compensatory strategies Compensations:  Minimize environmental distractions;Slow rate;Small sips/bites                  Oral care BID;Staff/trained caregiver to provide oral care   Frequent or constant Supervision/Assistance Dysphagia, unspecified (R13.10)     Continue with current plan of care    Earnie Cable, M.S., CCC-SLP Acute Rehabilitation Services Office: [phone]  Earnie SQUIBB [facility]  07/31/2023, 9:34 AM

## 2023-07-31 NOTE — Evaluation (Signed)
 Physical Therapy Evaluation Patient Details Name: Jacob Mcgrath. MRN: [id number] DOB: [birth year] Today's Date: 07/31/2023  History of Present Illness  Pt is 68 year old presented to [facility] on  07/28/23 for seizure. Pt intubated on arrival. Transferred to [facility] on 1/3. Extubated 1/4. PMH - dementia, DM 2, neuropathy, HTN, HDL, GERD, depression, anxiety, PVD,CVA with chronic lt hand contracture.  Clinical Impression  Pt presents to PT with longstanding mobility issues due to lt sided weakness and poor cognition. Pt is resident of long term care facility. Recommend return there at prior level of care. Will follow here to maximize mobility to decr burden of care for caregivers.         If plan is discharge home, recommend the following: Two people to help with walking and/or transfers;A lot of help with bathing/dressing/bathroom;Direct supervision/assist for medications management;Assist for transportation   Can travel by private vehicle   No    Equipment Recommendations None recommended by PT  Recommendations for Other Services       Functional Status Assessment Patient has had a recent decline in their functional status and/or demonstrates limited ability to make significant improvements in function in a reasonable and predictable amount of time     Precautions / Restrictions Precautions Precautions: Fall Restrictions Weight Bearing Restrictions Per Provider Order: No      Mobility  Bed Mobility Overal bed mobility: Needs Assistance Bed Mobility: Supine to Sit, Sit to Supine     Supine to sit: +2 for physical assistance, Max assist, HOB elevated Sit to supine: +2 for physical assistance, Max assist   General bed mobility comments: Assist for all aspects with pt resisting at times    Transfers                   General transfer comment: Did not attempt due to poor cognition and pt resisting some movements due to cognition and poor balance    Ambulation/Gait                   Stairs            Wheelchair Mobility     Tilt Bed    Modified Rankin (Stroke Patients Only)       Balance Overall balance assessment: Needs assistance Sitting-balance support: Single extremity supported, Feet supported Sitting balance-Leahy Scale: Poor Sitting balance - Comments: Varied from min to max assist Postural control: Right lateral lean                                   Pertinent Vitals/Pain Pain Assessment Pain Assessment: Faces Faces Pain Scale: Hurts little more Pain Location: lt little toe Pain Descriptors / Indicators: Moaning, Grimacing Pain Intervention(s): Limited activity within patient's tolerance, Repositioned    Home Living Family/patient expects to be discharged to:: Skilled nursing facility                   Additional Comments: From [facility]    Prior Function Prior Level of Function : Needs assist;Patient poor historian/Family not available       Physical Assist : Mobility (physical) Mobility (physical): Bed mobility;Transfers   Mobility Comments: Assist for bed to w/c transfers. Pt unable to say how much assist       Extremity/Trunk Assessment   Upper Extremity Assessment Upper Extremity Assessment: LUE deficits/detail;Generalized weakness LUE Deficits / Details: Chronic weakness and contracture from prior CVA  Lower Extremity Assessment Lower Extremity Assessment: Generalized weakness;LLE deficits/detail LLE Deficits / Details: Chronic weakness from prior CVA       Communication   Communication Communication: No apparent difficulties  Cognition Arousal: Alert Behavior During Therapy: WFL for tasks assessed/performed Overall Cognitive Status: No family/caregiver present to determine baseline cognitive functioning                                 General Comments: Pt with baseline dementia. Followed 1 step commands inconsistently and with incr time         General Comments General comments (skin integrity, edema, etc.): VSS    Exercises     Assessment/Plan    PT Assessment Patient needs continued PT services  PT Problem List Decreased strength;Decreased activity tolerance;Decreased balance;Decreased mobility;Decreased cognition       PT Treatment Interventions DME instruction;Functional mobility training;Therapeutic activities;Therapeutic exercise;Balance training;Neuromuscular re-education;Cognitive remediation;Patient/family education    PT Goals (Current goals can be found in the Care Plan section)  Acute Rehab PT Goals Patient Stated Goal: not stated PT Goal Formulation: Patient unable to participate in goal setting Time For Goal Achievement: 08/14/23 Potential to Achieve Goals: Fair    Frequency Min 1X/week     Co-evaluation               AM-PAC PT 6 Clicks Mobility  Outcome Measure Help needed turning from your back to your side while in a flat bed without using bedrails?: Total Help needed moving from lying on your back to sitting on the side of a flat bed without using bedrails?: Total Help needed moving to and from a bed to a chair (including a wheelchair)?: Total Help needed standing up from a chair using your arms (e.g., wheelchair or bedside chair)?: Total Help needed to walk in hospital room?: Total Help needed climbing 3-5 steps with a railing? : Total 6 Click Score: 6    End of Session   Activity Tolerance: Patient tolerated treatment well Patient left: in bed;with call bell/phone within reach;with bed alarm set Nurse Communication: Mobility status PT Visit Diagnosis: Other abnormalities of gait and mobility (R26.89);Hemiplegia and hemiparesis Hemiplegia - Right/Left: Left Hemiplegia - dominant/non-dominant: Non-dominant Hemiplegia - caused by: Cerebral infarction    Time: 9057-9042 PT Time Calculation (min) (ACUTE ONLY): 15 min   Charges:   PT Evaluation $PT Eval Moderate Complexity: 1  Mod   PT General Charges $$ ACUTE PT VISIT: 1 Visit         [facility] PT Acute Rehabilitation Services Office [phone]   Rodgers ORN [facility] 07/31/2023, 11:17 AM

## 2023-07-31 NOTE — Progress Notes (Signed)
 [facility] ADULT ICU REPLACEMENT PROTOCOL   The patient does apply for the [facility] Adult ICU Electrolyte Replacment Protocol based on the criteria listed below:   1.Exclusion criteria: TCTS, ECMO, Dialysis, and Myasthenia Gravis patients 2. Is GFR >/= 30 ml/min? Yes.    Patient's GFR today is >60 3. Is SCr </= 2? Yes.   Patient's SCr is 0.88 mg/dL 4. Did SCr increase >/= 0.5 in 24 hours? No. 5.Pt's weight >40kg  Yes.   6. Abnormal electrolyte(s): K, Mag  7. Electrolytes replaced per protocol 8.  Call MD STAT for K+ </= 2.5, Phos </= 1, or Mag </= 1 Physician:  Fate Hunter BRAVO Evelyn Moch 07/31/2023 3:51 AM

## 2023-07-31 NOTE — Progress Notes (Signed)
 NAME:  Jacob Witherspoon., MRN:  [id number], DOB:  March 29, 1956, LOS: 2 ADMISSION DATE:  07/28/2023, CONSULTATION DATE:  1/3 REFERRING MD:  Dr. Zackowski, CHIEF COMPLAINT:  status epilepticus   History of Present Illness:  Patient is a 68 yo M w/ pertinent PMH recent seizure on keppra , CVA w/ left sided weakness on asa/plavix , htn, t2dm, dementia presents to [facility] ED on 1/2 w/ seizure.  Patient recently hospitalized on 12/6-12/10 w/ new onset seizures. Started on keppra . Also w/ UTI and pna. Discharged to [facility]   On 1/2 patient having muscle spasms and brought in by EMS to [facility] ED. In ED had witnessed generalized tonic-clonic seizure activity and became unresponsive. Eyes deviated to left. Given ativan  and keppra . Intubated for airway protection. Started on propofol  for seizure control. Patient afebrile and bp stable. CT head no acute abnormality. CXR w/ small b/l infiltrate at bases; possible aspiration. Given vanc/rocephin . Neurology consulted. Recommended transfer to [facility] for cEEG. PCCM consulted for icu admission.  Pertinent  Medical History   Past Medical History:  Diagnosis Date   Dementia (HCC)    Diabetes mellitus without complication (HCC)    Hypertension    Stroke (HCC)    Significant Hospital Events: Including procedures, antibiotic start and stop dates in addition to other pertinent events   1/2 status epilepticus; intubated 1/3 transferred to [facility] from [facility]; pccm to admit 1/4 no further seizures off sedation.  Extubated.  Interim History / Subjective:  Extubated yesterday.  Remains calm but confused.  Objective   Blood pressure (!) 162/95, pulse (!) 101, temperature 98.9 F (37.2 C), temperature source Oral, resp. rate 16, height 5' 9 (1.753 m), weight 62.3 kg, SpO2 100%.      Intake/Output Summary (Last 24 hours) at 07/31/2023 0755 Last data filed at 07/31/2023 0650 Gross per 24 hour  Intake 730.35 ml  Output 675 ml  Net 55.35 ml   Filed Weights    07/29/23 0404 07/30/23 0200 07/31/23 0500  Weight: 59.9 kg 61.4 kg 62.3 kg    Examination: General: Thin chronically ill-appearing man. HEENT: No pressure injuries Neuro: Awake and following commands.  Disoriented to place and time.  Moves right side freely.  Left upper extremity plegic and contracted.  Moves left leg. CV: Normal heart sounds. PULM: Clear to auscultation bilaterally. GI: Soft nontender. Extremities: warm/dry, no edema    Ancillary test personally reviewed:   Creatinine normal at 0.88.  Mild hypokalemia 3.4 Mild leukocytosis 12.3, improving  Assessment & Plan:   Status epilepticus now resolved Acute encephalopathy due to postictal state-improving. Hx of recent seizure disorder in the context of prior CVA and dementia. 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  Continue Keppra  current dose -Ready for transfer.  PT OT ordered. -Resume home metoprolol  for hypertension. -Complete 5 days of antibiotics for aspiration pneumonia. -Sliding scale insulin . -Discharge planning came from SNF.  May be ready to return there soon.  Best Practice (right click and Reselect all SmartList Selections daily)   Diet/type: SLP to evaluate and advance diet.   DVT prophylaxis LMWH Pressure ulcer(s): N/A GI prophylaxis: Not applicable Lines: N/A Foley: External catheter Code Status:  full code Last date of multidisciplinary goals of care discussion [pending]   Fredia Alderton, MD [facility] ICU Physician [facility] Critical Care  Pager: [phone] Mobile: [phone] After hours: [phone].

## 2023-08-01 DIAGNOSIS — G40901 Epilepsy, unspecified, not intractable, with status epilepticus: Secondary | ICD-10-CM | POA: Diagnosis not present

## 2023-08-01 LAB — CULTURE, RESPIRATORY W GRAM STAIN

## 2023-08-01 LAB — GLUCOSE, CAPILLARY
Glucose-Capillary: 83 mg/dL (ref 70–99)
Glucose-Capillary: 99 mg/dL (ref 70–99)
Glucose-Capillary: 75 mg/dL (ref 70–99)
Glucose-Capillary: 128 mg/dL — ABNORMAL HIGH (ref 70–99)

## 2023-08-01 LAB — LEVETIRACETAM LEVEL: Levetiracetam Lvl: 50.8 ug/mL — ABNORMAL HIGH (ref 10.0–40.0)

## 2023-08-01 MED ORDER — MUPIROCIN 2 % EX OINT
1.0000 | TOPICAL_OINTMENT | Freq: Two times a day (BID) | CUTANEOUS | Status: DC
  Administered 2023-08-01 – 2023-08-04 (×7): 1 via NASAL
  Filled 2023-08-01 (×4): qty 22

## 2023-08-01 MED ORDER — NICOTINE 21 MG/24HR TD PT24
21.0000 mg | MEDICATED_PATCH | Freq: Every day | TRANSDERMAL | Status: DC
  Administered 2023-08-01 – 2023-08-04 (×4): 21 mg via TRANSDERMAL
  Filled 2023-08-01 (×4): qty 1

## 2023-08-01 MED ORDER — VANCOMYCIN HCL 750 MG IV SOLR
750.0000 mg | Freq: Two times a day (BID) | INTRAVENOUS | Status: DC
  Administered 2023-08-02 (×3): 750 mg via INTRAVENOUS
  Filled 2023-08-01 (×5): qty 15

## 2023-08-01 MED ORDER — ENSURE ENLIVE PO LIQD
237.0000 mL | Freq: Two times a day (BID) | ORAL | Status: DC
  Administered 2023-08-02 – 2023-08-04 (×5): 237 mL via ORAL

## 2023-08-01 MED ORDER — VANCOMYCIN HCL 1250 MG/250ML IV SOLN
1250.0000 mg | Freq: Once | INTRAVENOUS | Status: AC
  Administered 2023-08-01: 1250 mg via INTRAVENOUS
  Filled 2023-08-01: qty 250

## 2023-08-01 MED ORDER — VANCOMYCIN HCL 750 MG/150ML IV SOLN
750.0000 mg | Freq: Two times a day (BID) | INTRAVENOUS | Status: DC
  Filled 2023-08-01: qty 150

## 2023-08-01 NOTE — Progress Notes (Signed)
 PROGRESS NOTE    Jacob Mcgrath.  FMW:[id number] DOB: 04-03-1956 DOA: 07/28/2023 PCP: [name], MD  Outpatient Specialists:     Brief Narrative:  Patient is a 68 year old African-American male with past medical history significant for CVA with left-sided hemiparesis on aspirin  and Plavix , seizure on Keppra , hypertension, type 2 diabetes mellitus and dementia.  Patient was admitted with seizure.  Chest x-ray revealed bilateral infiltrates at the bases, possible aspiration.  ICU swab is positive for MRSA.  Sputum cultures growing Staph aureus.  Patient is currently on IV Rocephin .  Discussed with pharmacy team, IV vancomycin  given today.  Patient was initially under ICU, but transferred to [facility] today.    Significant Hospital Events: Including procedures, antibiotic start and stop dates in addition to other pertinent events   1/2 status epilepticus; intubated 1/3 transferred to [facility] from [facility]; pccm to admit 1/4 no further seizures off sedation.  Extubated.  08/01/2023: Patient seen.  No significant coherent history from patient.  Assessment & Plan:   Principal Problem:   Status epilepticus (HCC)   Status epilepticus/Hx of recent seizure disorder in the context of prior CVA and dementia.: -Resolved. -Continue Keppra . -Seizure precautions.   -Mechanical ventilation for airway protection now extubated  Acute encephalopathy: -Due to postictal state -improving.  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  Possible aspiration: -IV Rocephin . -IV vancomycin  until final sputum cultures. -Aspiration precautions.  Hypertension: -Continue to optimize. -Goal blood pressure of less than 130/80 mmHg.  Hyperlipidemia: -Continue Lipitor .  Type 2 diabetes mellitus: -Continue sliding scale insulin  coverage.   DVT prophylaxis: Subcutaneous Lovenox  Code Status: Full code Family Communication:  Disposition Plan:  Likely discharge in the next couple of days.   Consultants:  Patient was initially in the ICU team. Neurology  Procedures:  The patient and extubation  Antimicrobials:  IV Rocephin . IV vancomycin  (while awaiting final sputum culture)   Subjective: No significant history from patient.  Objective: Vitals:   08/01/23 0600 08/01/23 0700 08/01/23 0800 08/01/23 0900  BP: (!) 173/95 (!) 125/98 (!) 143/98 (!) 166/91  Pulse: (!) 101 77 73 69  Resp: 17 (!) 21 12 16   Temp:   97.8 F (36.6 C)   TempSrc:   Axillary   SpO2: 100% 97% 100% 100%  Weight:      Height:        Intake/Output Summary (Last 24 hours) at 08/01/2023 0948 Last data filed at 08/01/2023 0900 Gross per 24 hour  Intake 780.72 ml  Output 3150 ml  Net -2369.28 ml   Filed Weights   07/29/23 0404 07/30/23 0200 07/31/23 0500  Weight: 59.9 kg 61.4 kg 62.3 kg    Examination:  General exam: Appears calm and comfortable.  Left-sided hemiparesis. Respiratory system: Clear to auscultation. . Cardiovascular system: S1 & S2 heard Gastrointestinal system: Abdomen is soft and nontender. Central nervous system: Awake and alert.  Left-sided hemiparesis.     Data Reviewed: I have personally reviewed following labs and imaging studies  CBC: Recent Labs  Lab 07/28/23 2004 07/30/23 0028 07/31/23 0238  WBC 21.2* 13.8* 12.3*  NEUTROABS 14.7*  --   --   HGB 12.7* 12.1* 11.0*  HCT 41.2 37.5* 34.1*  MCV 97.4 91.2 91.7  PLT 277 227 185   Basic Metabolic Panel: Recent Labs  Lab 07/28/23 2004 07/30/23 0028 07/31/23 0238  NA 142 144 141  K 4.3 3.9 3.4*  CL 110 110 107  CO2  14* 21* 22  GLUCOSE 151* 139* 97  BUN 17 11 10   CREATININE 1.29* 0.94 0.88  CALCIUM  9.0 9.0 9.0  MG 1.7 1.8 1.7  PHOS  --   --  2.7   GFR: Estimated Creatinine Clearance: 71.8 mL/min (by C-G formula based on SCr of 0.88 mg/dL). Liver Function Tests: Recent Labs  Lab 07/28/23 2004  AST 27  ALT 21  ALKPHOS 92  BILITOT 0.6  PROT 7.9   ALBUMIN 3.8   No results for input(s): LIPASE, AMYLASE in the last 168 hours. No results for input(s): AMMONIA in the last 168 hours. Coagulation Profile: No results for input(s): INR, PROTIME in the last 168 hours. Cardiac Enzymes: No results for input(s): CKTOTAL, CKMB, CKMBINDEX, TROPONINI in the last 168 hours. BNP (last 3 results) No results for input(s): PROBNP in the last 8760 hours. HbA1C: No results for input(s): HGBA1C in the last 72 hours. CBG: Recent Labs  Lab 07/31/23 1531 07/31/23 1536 07/31/23 1538 07/31/23 1919 08/01/23 0803  GLUCAP 17* 46* 103* 124* 83   Lipid Profile: Recent Labs    07/30/23 0028  TRIG 56   Thyroid Function Tests: No results for input(s): TSH, T4TOTAL, FREET4, T3FREE, THYROIDAB in the last 72 hours. Anemia Panel: No results for input(s): VITAMINB12, FOLATE, FERRITIN, TIBC, IRON, RETICCTPCT in the last 72 hours. Urine analysis:    Component Value Date/Time   COLORURINE YELLOW 07/29/2023 0528   APPEARANCEUR CLEAR 07/29/2023 0528   LABSPEC 1.014 07/29/2023 0528   PHURINE 6.0 07/29/2023 0528   GLUCOSEU NEGATIVE 07/29/2023 0528   HGBUR NEGATIVE 07/29/2023 0528   BILIRUBINUR NEGATIVE 07/29/2023 0528   KETONESUR NEGATIVE 07/29/2023 0528   PROTEINUR NEGATIVE 07/29/2023 0528   NITRITE NEGATIVE 07/29/2023 0528   LEUKOCYTESUR TRACE (A) 07/29/2023 0528   Sepsis Labs: @LABRCNTIP (procalcitonin:4,lacticidven:4)  ) Recent Results (from the past 240 hours)  Resp panel by RT-PCR (RSV, Flu A&B, Covid) Anterior Nasal Swab     Status: None   Collection Time: 07/28/23  7:40 PM   Specimen: Anterior Nasal Swab  Result Value Ref Range Status   SARS Coronavirus 2 by RT PCR NEGATIVE NEGATIVE Final    Comment: (NOTE) SARS-CoV-2 target nucleic acids are NOT DETECTED.  The SARS-CoV-2 RNA is generally detectable in upper respiratory specimens during the acute phase of infection. The lowest concentration of  SARS-CoV-2 viral copies this assay can detect is 138 copies/mL. A negative result does not preclude SARS-Cov-2 infection and should not be used as the sole basis for treatment or other patient management decisions. A negative result may occur with  improper specimen collection/handling, submission of specimen other than nasopharyngeal swab, presence of viral mutation(s) within the areas targeted by this assay, and inadequate number of viral copies(<138 copies/mL). A negative result must be combined with clinical observations, patient history, and epidemiological information. The expected result is Negative.  Fact Sheet for Patients:  bloggercourse.com  Fact Sheet for Healthcare Providers:  seriousbroker.it  This test is no t yet approved or cleared by the [place]  FDA and  has been authorized for detection and/or diagnosis of SARS-CoV-2 by FDA under an Emergency Use Authorization (EUA). This EUA will remain  in effect (meaning this test can be used) for the duration of the COVID-19 declaration under Section 564(b)(1) of the Act, 21 U.S.C.section 360bbb-3(b)(1), unless the authorization is terminated  or revoked sooner.       Influenza A by PCR NEGATIVE NEGATIVE Final   Influenza B by PCR NEGATIVE NEGATIVE Final  Comment: (NOTE) The Xpert Xpress SARS-CoV-2/FLU/RSV plus assay is intended as an aid in the diagnosis of influenza from Nasopharyngeal swab specimens and should not be used as a sole basis for treatment. Nasal washings and aspirates are unacceptable for Xpert Xpress SARS-CoV-2/FLU/RSV testing.  Fact Sheet for Patients: bloggercourse.com  Fact Sheet for Healthcare Providers: seriousbroker.it  This test is not yet approved or cleared by the [place]  FDA and has been authorized for detection and/or diagnosis of SARS-CoV-2 by FDA under an Emergency Use  Authorization (EUA). This EUA will remain in effect (meaning this test can be used) for the duration of the COVID-19 declaration under Section 564(b)(1) of the Act, 21 U.S.C. section 360bbb-3(b)(1), unless the authorization is terminated or revoked.     Resp Syncytial Virus by PCR NEGATIVE NEGATIVE Final    Comment: (NOTE) Fact Sheet for Patients: bloggercourse.com  Fact Sheet for Healthcare Providers: seriousbroker.it  This test is not yet approved or cleared by the [place]  FDA and has been authorized for detection and/or diagnosis of SARS-CoV-2 by FDA under an Emergency Use Authorization (EUA). This EUA will remain in effect (meaning this test can be used) for the duration of the COVID-19 declaration under Section 564(b)(1) of the Act, 21 U.S.C. section 360bbb-3(b)(1), unless the authorization is terminated or revoked.  Performed at [facility], [address], [place], [place] [address]   Culture, blood (Routine X 2) w Reflex to ID Panel     Status: None (Preliminary result)   Collection Time: 07/28/23  8:04 PM   Specimen: BLOOD  Result Value Ref Range Status   Specimen Description BLOOD LEFT ANTECUBITAL  Final   Special Requests   Final    BOTTLES DRAWN AEROBIC AND ANAEROBIC Blood Culture results may not be optimal due to an inadequate volume of blood received in culture bottles   Culture   Final    NO GROWTH 4 DAYS Performed at [facility], [address] [place], [place] [address]    Report Status PENDING  Incomplete  Culture, blood (Routine X 2) w Reflex to ID Panel     Status: None (Preliminary result)   Collection Time: 07/28/23  8:04 PM   Specimen: BLOOD  Result Value Ref Range Status   Specimen Description BLOOD BLOOD LEFT FOREARM  Final   Special Requests   Final    BOTTLES DRAWN AEROBIC ONLY Blood Culture results may not be optimal due to an inadequate volume of blood received in culture bottles   Culture    Final    NO GROWTH 4 DAYS Performed at [facility], [address] [place], [place] [address]    Report Status PENDING  Incomplete  MRSA Next Gen by PCR, Nasal     Status: Abnormal   Collection Time: 07/29/23  4:03 AM   Specimen: Nasal Mucosa; Nasal Swab  Result Value Ref Range Status   MRSA by PCR Next Gen DETECTED (A) NOT DETECTED Final    Comment: RESULT CALLED TO, READ BACK BY AND VERIFIED WITH: K MOLEN,RN@0535  07/29/23 MK (NOTE) The GeneXpert MRSA Assay (FDA approved for NASAL specimens only), is one component of a comprehensive MRSA colonization surveillance program. It is not intended to diagnose MRSA infection nor to guide or monitor treatment for MRSA infections. Test performance is not FDA approved in patients less than 50 years old. Performed at [facility], [address], [place], [place] [address]   Culture, Respiratory w Gram Stain     Status: None (Preliminary  result)   Collection Time: 07/29/23  9:52 PM   Specimen: Tracheal Aspirate; Respiratory  Result Value Ref Range Status   Specimen Description TRACHEAL ASPIRATE  Final   Special Requests NONE  Final   Gram Stain   Final    MODERATE WBC PRESENT,BOTH PMN AND MONONUCLEAR FEW GRAM POSITIVE COCCI IN PAIRS    Culture   Final    ABUNDANT STAPHYLOCOCCUS AUREUS SUSCEPTIBILITIES TO FOLLOW Performed at [facility], [address] [place], [place] [address]    Report Status PENDING  Incomplete         Radiology Studies: No results found.      Scheduled Meds:  aspirin   81 mg Oral Daily   atorvastatin   80 mg Oral QHS   Chlorhexidine  Gluconate Cloth  6 each Topical Q0600   clopidogrel   75 mg Oral Q breakfast   enoxaparin  (LOVENOX ) injection  40 mg Subcutaneous Q24H   insulin  aspart  0-5 Units Subcutaneous QHS   insulin  aspart  0-9 Units Subcutaneous TID WC   levETIRAcetam   750 mg Oral BID   metoprolol  tartrate  25 mg Oral BID   multivitamin with minerals  1 tablet Oral Daily    thiamine   100 mg Oral Daily   Continuous Infusions:  cefTRIAXone  (ROCEPHIN )  IV 2 g (07/31/23 2154)     LOS: 3 days    Time spent: 35 minutes    Leatrice Chapel, MD  [facility] Pager #: [phone] 7PM-7AM contact night coverage as above

## 2023-08-01 NOTE — Progress Notes (Addendum)
 Pharmacy Antibiotic Note  Jacob Mcgrath. is a 68 y.o. male admitted on 07/28/2023 with seizures, now w/ concern for pneumonia with abundant MRSA in respiratory cx.  Pharmacy has been consulted for vancomycin  dosing.  Plan: Vancomycin  750mg  IV Q12H. Goal AUC 400-550.  Expected AUC 520.  Height: 5' 9 (175.3 cm) Weight: 62.3 kg (137 lb 5.6 oz) IBW/kg (Calculated) : 70.7  Temp (24hrs), Avg:98.2 F (36.8 C), Min:97.8 F (36.6 C), Max:98.4 F (36.9 C)  Recent Labs  Lab 07/28/23 2004 07/28/23 2140 07/30/23 0028 07/31/23 0238  WBC 21.2*  --  13.8* 12.3*  CREATININE 1.29*  --  0.94 0.88  LATICACIDVEN >9.0* 2.4*  --   --     Estimated Creatinine Clearance: 71.8 mL/min (by C-G formula based on SCr of 0.88 mg/dL).    Allergies  Allergen Reactions   Amoxicillin Other (See Comments)    Unknown reaction per facility   Ampicillin Other (See Comments)    Unknown reaction per facility   Tetracyclines & Related Rash    Thank you for allowing pharmacy to be a part of this patient's care.  Marvetta Dauphin, PharmD, BCPS  08/01/2023 11:05 PM

## 2023-08-01 NOTE — Plan of Care (Signed)
  Problem: Education: Goal: Knowledge of General Education information will improve Description: Including pain rating scale, medication(s)/side effects and non-pharmacologic comfort measures Outcome: Progressing   Problem: Health Behavior/Discharge Planning: Goal: Ability to manage health-related needs will improve Outcome: Progressing   Problem: Clinical Measurements: Goal: Ability to maintain clinical measurements within normal limits will improve Outcome: Progressing Goal: Will remain free from infection Outcome: Progressing Goal: Diagnostic test results will improve Outcome: Progressing Goal: Respiratory complications will improve Outcome: Progressing Goal: Cardiovascular complication will be avoided Outcome: Progressing   Problem: Activity: Goal: Risk for activity intolerance will decrease Outcome: Progressing   Problem: Nutrition: Goal: Adequate nutrition will be maintained Outcome: Progressing   Problem: Coping: Goal: Level of anxiety will decrease Outcome: Progressing   Problem: Elimination: Goal: Will not experience complications related to bowel motility Outcome: Progressing Goal: Will not experience complications related to urinary retention Outcome: Progressing   Problem: Pain Management: Goal: General experience of comfort will improve Outcome: Progressing   Problem: Safety: Goal: Ability to remain free from injury will improve Outcome: Progressing   Problem: Skin Integrity: Goal: Risk for impaired skin integrity will decrease Outcome: Progressing   Problem: Education: Goal: Ability to describe self-care measures that may prevent or decrease complications (Diabetes Survival Skills Education) will improve Outcome: Progressing Goal: Individualized Educational Video(s) Outcome: Progressing   Problem: Coping: Goal: Ability to adjust to condition or change in health will improve Outcome: Progressing   Problem: Fluid Volume: Goal: Ability to  maintain a balanced intake and output will improve Outcome: Progressing

## 2023-08-01 NOTE — Progress Notes (Signed)
 Nutrition Follow-up  DOCUMENTATION CODES:  Severe malnutrition in context of social or environmental circumstances  INTERVENTION:  Continue current diet as ordered per SLP Encourage PO intake Ordering assistance Ensure Enlive po BID, each supplement provides 350 kcal and 20 grams of protein. MVI with minerals daily  NUTRITION DIAGNOSIS:  Severe Malnutrition related to social / environmental circumstances (inadequate energy intake) as evidenced by severe fat depletion, severe muscle depletion. -   GOAL:  Patient will meet greater than or equal to 90% of their needs  MONITOR:  PO intake, Supplement acceptance, Labs, Weight trends  REASON FOR ASSESSMENT:  Ventilator    ASSESSMENT:  Pt with hx of HTN, CVA with residual left sided deficits, DM type 2, and dementia presented to ED from SNF with seizures.  1/2 - presented to [facility] ED, intubated 1/3 - transferred to [facility] 1/4 - extubated, bedside swallow, SLP recommended NPO 1/5 - SLP advanced to DYS 1 1/9 - SLP advanced to regular/thin  Pt resting in bed at the time of assessment. Awake and alert but pleasantly confused. States he will be going to the store in a little while.   Pt reports good intake and flowsheet is reflective of this. Pt also reports that he likes the ensure that he is receiving. Will continue current nutrition plan. TOC following and will assist with placing pt back in LTC when stable.   Average Meal Intake: 1/5-1/6: 78% average intake x 4 recorded meals  Admit weight: 59.2 kg  Current weight: 60.7kg   Intake/Output Summary (Last 24 hours) at 08/02/2023 1334 Last data filed at 08/02/2023 1010 Gross per 24 hour  Intake 470 ml  Output 2800 ml  Net -2330 ml  Net IO Since Admission: -3,776.73 mL [08/02/23 1334]  Drains/Lines: UOP x 24 hours  Nutritionally Relevant Medications: Scheduled Meds:  atorvastatin   80 mg Oral QHS   Ensure Plus High Protein  237 mL Oral BID BM   multivitamin with minerals  1  tablet Oral Daily   Continuous Infusions:  cefTRIAXone  (ROCEPHIN )  IV Stopped (08/01/23 2202)   vancomycin  (VANCOCIN ) 750 mg in sodium chloride  0.9 % 250 mL IVPB 750 mg (08/02/23 1018)   PRN Meds: docusate sodium , polyethylene glycol  Labs Reviewed: Mg 1.5 CBG ranges from 75-145 mg/dL over the last 24 hours HgbA1c 5.4% (12/6)  NUTRITION - FOCUSED PHYSICAL EXAM: Flowsheet Row Most Recent Value  Orbital Region Severe depletion  Upper Arm Region Severe depletion  Thoracic and Lumbar Region Moderate depletion  Buccal Region Severe depletion  Temple Region Severe depletion  Clavicle Bone Region Severe depletion  Clavicle and Acromion Bone Region Severe depletion  Scapular Bone Region Moderate depletion  Dorsal Hand Unable to assess  [mittens]  Patellar Region Severe depletion  Anterior Thigh Region Severe depletion  Posterior Calf Region Severe depletion  Edema (RD Assessment) None  Hair Reviewed  Eyes Reviewed  Mouth Reviewed  Skin Reviewed  Nails Unable to assess  [mittens]   Diet Order:   Diet Order             Diet regular Fluid consistency: Thin  Diet effective now                   EDUCATION NEEDS:  Not appropriate for education at this time  Skin:  Skin Assessment: Reviewed RN Assessment  Last BM:  1/6 - type 6  Height:  Ht Readings from Last 1 Encounters:  07/28/23 5' 9 (1.753 m)    Weight:  Wt Readings from Last 1 Encounters:  08/02/23 60.7 kg    Ideal Body Weight:  72.7 kg  BMI:  Body mass index is 19.76 kg/m.  Estimated Nutritional Needs:  Kcal:  1700-1900 kcal/d Protein:  90-105g/d Fluid:  >/=1.8L/d    Vernell Lukes, RD, LDN Registered Dietitian II Please reach out via secure chat Weekend on-call pager # available in [facility]

## 2023-08-01 NOTE — Inpatient Diabetes Management (Addendum)
 Inpatient Diabetes Program Recommendations  AACE/ADA: New Consensus Statement on Inpatient Glycemic Control (2015)  Target Ranges:  Prepandial:   less than 140 mg/dL      Peak postprandial:   less than 180 mg/dL (1-2 hours)      Critically ill patients:  140 - 180 mg/dL    Latest Reference Range & Units 07/30/23 15:32 07/30/23 19:23 07/30/23 23:14 07/31/23 03:19 07/31/23 03:54 07/31/23 03:58 07/31/23 07:56 07/31/23 11:29  Glucose-Capillary 70 - 99 mg/dL 63 (L) 891 (H) 88 65 (L) 33 (LL) 151 (H) 79 95    Latest Reference Range & Units 07/31/23 15:31 07/31/23 15:36 07/31/23 15:38 07/31/23 19:19 08/01/23 08:03  Glucose-Capillary 70 - 99 mg/dL 17 (LL) 46 (L) 896 (H) 124 (H) 83  (LL): Data is critically low (L): Data is abnormally low (H): Data is abnormally high     History: DM2  Home DM Meds: None Listed  Current Orders: Novolog  Sensitive Correction Scale/ SSI (0-9 units) TID AC + HS    MD- Note pt having issues with Hypoglycemia.  Only 1 unit Novolog  total has been given since admission.  Please consider Stopping Novolog  SSI for now but Continue CBG checks    --Will follow patient during hospitalization--  Adina Rudolpho Arrow RN, MSN, CDCES Diabetes Coordinator Inpatient Glycemic Control Team Team Pager: [phone] (8a-5p)

## 2023-08-01 NOTE — TOC Initial Note (Signed)
 Transition of Care (TOC) - Initial/Assessment Note    Patient Details  Name: Jacob Mcgrath. MRN: [id number] Date of Birth: 29-Apr-1956  Transition of Care [facility]) CM/SW Contact:    Inocente GORMAN Kindle, LCSW Phone Number: 08/01/2023, 3:32 PM  Clinical Narrative:                 Patient admitted from [facility] under long term care and has a Marine Scientist. CSW continuing to follow for return.   Expected Discharge Plan: Skilled Nursing Facility Barriers to Discharge: Continued Medical Work up   Patient Goals and CMS Choice Patient states their goals for this hospitalization and ongoing recovery are:: Return to [facility]          Expected Discharge Plan and Services In-house Referral: Clinical Social Work   Post Acute Care Choice: Skilled Nursing Facility Living arrangements for the past 2 months: Skilled Nursing Facility                                      Prior Living Arrangements/Services Living arrangements for the past 2 months: Skilled Nursing Facility Lives with:: Facility Resident Patient language and need for interpreter reviewed:: Yes Do you feel safe going back to the place where you live?: Yes      Need for Family Participation in Patient Care: Yes (Comment) Care giver support system in place?: No (comment) Current home services: DME Criminal Activity/Legal Involvement Pertinent to Current Situation/Hospitalization: No - Comment as needed  Activities of Daily Living      Permission Sought/Granted Permission sought to share information with : Facility Medical Sales Representative, Family Supports Permission granted to share information with : Yes, Verbal Permission Granted  Share Information with NAME: Kayla-Guardian  Permission granted to share info w AGENCY: [facility]  Permission granted to share info w Relationship: Guardian  Permission granted to share info w Contact Information: [phone]  Emotional Assessment Appearance:: Appears stated  age Attitude/Demeanor/Rapport: Unable to Assess Affect (typically observed): Unable to Assess Orientation: : Oriented to Self Alcohol / Substance Use: Not Applicable Psych Involvement: No (comment)  Admission diagnosis:  Status epilepticus (HCC) [G40.901] Patient Active Problem List   Diagnosis Date Noted   Status epilepticus (HCC) 07/29/2023   Seizure (HCC) 07/02/2023   Stroke (HCC) 07/01/2023   Leukocytosis 07/01/2023    Class: Acute   Tremor 02/27/2023   GERD (gastroesophageal reflux disease) 02/27/2023   Depression 02/27/2023   Protein-calorie malnutrition, severe 10/07/2022   Ischemic leg 10/01/2022   Hypertension 10/01/2022   Diabetes mellitus without complication (HCC) 10/01/2022   PCP:  [name], MD Pharmacy:  No Pharmacies Listed    Social Drivers of Health (SDOH) Social History: SDOH Screenings   Food Insecurity: Patient Unable To Answer (07/30/2023)  Housing: Patient Unable To Answer (07/30/2023)  Transportation Needs: Patient Unable To Answer (07/30/2023)  Utilities: Patient Unable To Answer (07/30/2023)  Social Connections: Patient Unable To Answer (07/30/2023)  Tobacco Use: High Risk (07/28/2023)   SDOH Interventions:     Readmission Risk Interventions    08/01/2023    3:31 PM 03/01/2023    2:40 PM  Readmission Risk Prevention Plan  Transportation Screening Complete Complete  PCP or Specialist Appt within 5-7 Days Complete Not Complete  Home Care Screening Complete Complete  Medication Review (RN CM) Complete Complete

## 2023-08-01 NOTE — Progress Notes (Signed)
 The pt was transported to 3W27 on monitor, VSS. Receiving RN at bedside.

## 2023-08-01 NOTE — Progress Notes (Signed)
 Speech Language Pathology Treatment: Dysphagia  Patient Details Name: Jacob Mcgrath. MRN: [id number] DOB: [birth year] Today's Date: 08/01/2023 Time: 9151-9142 SLP Time Calculation (min) (ACUTE ONLY): 9 min  Assessment / Plan / Recommendation Clinical Impression  Pt seen for ongoing dysphagia management.  RN with no negative reports with POs and pt with good intake at AM meal.  Today pt tolerated puree, regular solids, and thin liquid by cup and straw.  There was cough x1 which was delayed to PO intake of thin liquids by cup, but pt did attribute this to water going down the wrong way.  There were no further clinical s/s of aspiration, including with straw sips.  Pt exhibited good oral clearance of solids.  Pt does not have dentures in hospital, but he reports he hasn't had them for some time now and is not having much difficulty chewing at this time.  Pt would prefer regular diet and can choose softer foods as needed.  Recommend regular texture diet with thin liquids.     HPI HPI: 69 year old male with recent seizure on keppra , CVA w/ left sided weakness, HTN, Type2 diabetes mellitus, and dementia presents to [facility] ED on 1/2 w/ seizure. ETT 1/2-1/4. Chest x ray with small bilateral infiltrates at the bases; possibe aspiration.      SLP Plan  Continue with current plan of care      Recommendations for follow up therapy are one component of a multi-disciplinary discharge planning process, led by the attending physician.  Recommendations may be updated based on patient status, additional functional criteria and insurance authorization.    Recommendations  Diet recommendations: Regular;Thin liquid Liquids provided via: Cup;Straw Medication Administration: Whole meds with liquid Supervision: Patient able to self feed Compensations: Minimize environmental distractions;Slow rate;Small sips/bites Postural Changes and/or Swallow Maneuvers: Seated upright 90 degrees                  Oral  care BID;Staff/trained caregiver to provide oral care   Frequent or constant Supervision/Assistance Dysphagia, unspecified (R13.10)     Continue with current plan of care     Anette FORBES Grippe, MA, CCC-SLP Acute Rehabilitation Services Office: [phone] 08/01/2023, 9:43 AM

## 2023-08-01 NOTE — NC FL2 (Signed)
 [place]  MEDICAID FL2 LEVEL OF CARE FORM     IDENTIFICATION  Patient Name: Jacob Mcgrath. Birthdate: Oct 13, 1955 Sex: male Admission Date (Current Location): 07/28/2023  [place] and [place] Number:  [employer] and Address:  [address], [address], [place], [place] [address]      Provider Number: [id number]  Attending Physician Name and Address:  Rosario Leatrice FERNS, MD  Relative Name and Phone Number:       Current Level of Care: Hospital Recommended Level of Care: Skilled Nursing Facility Prior Approval Number:    Date Approved/Denied:   PASRR Number: [id number]  Discharge Plan: SNF    Current Diagnoses: Patient Active Problem List   Diagnosis Date Noted   Status epilepticus (HCC) 07/29/2023   Seizure (HCC) 07/02/2023   Stroke (HCC) 07/01/2023   Leukocytosis 07/01/2023   Tremor 02/27/2023   GERD (gastroesophageal reflux disease) 02/27/2023   Depression 02/27/2023   Protein-calorie malnutrition, severe 10/07/2022   Ischemic leg 10/01/2022   Hypertension 10/01/2022   Diabetes mellitus without complication (HCC) 10/01/2022    Orientation RESPIRATION BLADDER Height & Weight     Self  Normal Incontinent, External catheter Weight: 137 lb 5.6 oz (62.3 kg) Height:  5' 9 (175.3 cm)  BEHAVIORAL SYMPTOMS/MOOD NEUROLOGICAL BOWEL NUTRITION STATUS      Incontinent Diet (See dc summary)  AMBULATORY STATUS COMMUNICATION OF NEEDS Skin   Extensive Assist Verbally Normal                       Personal Care Assistance Level of Assistance  Bathing, Feeding, Dressing Bathing Assistance: Maximum assistance Feeding assistance: Maximum assistance Dressing Assistance: Maximum assistance     Functional Limitations Info  Sight Sight Info: Impaired        SPECIAL CARE FACTORS FREQUENCY                       Contractures Contractures Info: Not present    Additional Factors Info  Code Status, Allergies Code  Status Info: Full Allergies Info: Amoxicillin, Ampicillin, Tetracyclines & Related           Current Medications (08/01/2023):  This is the current hospital active medication list Current Facility-Administered Medications  Medication Dose Route Frequency Provider Last Rate Last Admin   acetaminophen  (TYLENOL ) suppository 650 mg  650 mg Rectal Q6H PRN Agarwala, Ravi, MD   650 mg at 07/30/23 1749   acetaminophen  (TYLENOL ) tablet 650 mg  650 mg Oral Q4H PRN Zelphia Randall PEDLAR, RPH       aspirin  chewable tablet 81 mg  81 mg Oral Daily Agarwala, Fredia, MD   81 mg at 08/01/23 0909   atorvastatin  (LIPITOR ) tablet 80 mg  80 mg Oral QHS Agarwala, Ravi, MD   80 mg at 07/31/23 2211   cefTRIAXone  (ROCEPHIN ) 2 g in sodium chloride  0.9 % 100 mL IVPB  2 g Intravenous Q24H Agarwala, Fredia, MD 200 mL/hr at 07/31/23 2154 2 g at 07/31/23 2154   Chlorhexidine  Gluconate Cloth 2 % PADS 6 each  6 each Topical Q0600 Kara Dorn NOVAK, MD   6 each at 07/31/23 2211   clopidogrel  (PLAVIX ) tablet 75 mg  75 mg Oral Q breakfast Agarwala, Ravi, MD   75 mg at 08/01/23 9090   docusate sodium  (COLACE) capsule 100 mg  100 mg Oral BID PRN Zelphia Randall PEDLAR, [facility]       enoxaparin  (LOVENOX ) injection 40 mg  40 mg  Subcutaneous Q24H Payne, John D, PA-C   40 mg at 08/01/23 9092   insulin  aspart (novoLOG ) injection 0-5 Units  0-5 Units Subcutaneous QHS Agarwala, Ravi, MD       insulin  aspart (novoLOG ) injection 0-9 Units  0-9 Units Subcutaneous TID WC Agarwala, Ravi, MD       labetalol  (NORMODYNE ) injection 10 mg  10 mg Intravenous Q2H PRN Agarwala, Ravi, MD   10 mg at 08/01/23 9480   levETIRAcetam  (KEPPRA ) tablet 750 mg  750 mg Oral BID Zhou, Jenny Z, RPH   750 mg at 08/01/23 9090   metoprolol  tartrate (LOPRESSOR ) tablet 25 mg  25 mg Oral BID Agarwala, Ravi, MD   25 mg at 08/01/23 9090   multivitamin with minerals tablet 1 tablet  1 tablet Oral Daily Agarwala, Ravi, MD   1 tablet at 08/01/23 9090   mupirocin  ointment (BACTROBAN ) 2 % 1  Application  1 Application Nasal BID Rosario Eland I, MD   1 Application at 08/01/23 1430   nicotine  (NICODERM CQ  - dosed in mg/24 hours) patch 21 mg  21 mg Transdermal Daily Rosario Eland I, MD       Oral care mouth rinse  15 mL Mouth Rinse PRN Kara Dorn NOVAK, MD       polyethylene glycol (MIRALAX  / GLYCOLAX ) packet 17 g  17 g Oral Daily PRN Zelphia Randall PEDLAR, RPH       thiamine  (VITAMIN B1) tablet 100 mg  100 mg Oral Daily Agarwala, Ravi, MD   100 mg at 08/01/23 0909     Discharge Medications: Please see discharge summary for a list of discharge medications.  Relevant Imaging Results:  Relevant Lab Results:   Additional Information SS#: [id number]  Jacob Mcintire S Aryanah Enslow, LCSW

## 2023-08-02 DIAGNOSIS — G40901 Epilepsy, unspecified, not intractable, with status epilepticus: Principal | ICD-10-CM

## 2023-08-02 DIAGNOSIS — I10 Essential (primary) hypertension: Secondary | ICD-10-CM | POA: Diagnosis not present

## 2023-08-02 DIAGNOSIS — J189 Pneumonia, unspecified organism: Secondary | ICD-10-CM | POA: Diagnosis not present

## 2023-08-02 DIAGNOSIS — G9341 Metabolic encephalopathy: Secondary | ICD-10-CM

## 2023-08-02 DIAGNOSIS — E119 Type 2 diabetes mellitus without complications: Secondary | ICD-10-CM

## 2023-08-02 DIAGNOSIS — F0390 Unspecified dementia without behavioral disturbance: Secondary | ICD-10-CM

## 2023-08-02 LAB — CBC WITH DIFFERENTIAL/PLATELET
Abs Immature Granulocytes: 0.03 10*3/uL (ref 0.00–0.07)
Basophils Absolute: 0 10*3/uL (ref 0.0–0.1)
Basophils Relative: 0 %
Eosinophils Absolute: 0.6 10*3/uL — ABNORMAL HIGH (ref 0.0–0.5)
Eosinophils Relative: 7 %
HCT: 38.1 % — ABNORMAL LOW (ref 39.0–52.0)
Hemoglobin: 11.8 g/dL — ABNORMAL LOW (ref 13.0–17.0)
Immature Granulocytes: 0 %
Lymphocytes Relative: 33 %
Lymphs Abs: 3 10*3/uL (ref 0.7–4.0)
MCH: 29.4 pg (ref 26.0–34.0)
MCHC: 31 g/dL (ref 30.0–36.0)
MCV: 94.8 fL (ref 80.0–100.0)
Monocytes Absolute: 1.2 10*3/uL — ABNORMAL HIGH (ref 0.1–1.0)
Monocytes Relative: 13 %
Neutro Abs: 4.2 10*3/uL (ref 1.7–7.7)
Neutrophils Relative %: 47 %
Platelets: 202 10*3/uL (ref 150–400)
RBC: 4.02 MIL/uL — ABNORMAL LOW (ref 4.22–5.81)
RDW: 13.3 % (ref 11.5–15.5)
WBC: 9.1 10*3/uL (ref 4.0–10.5)
nRBC: 0 % (ref 0.0–0.2)

## 2023-08-02 LAB — CULTURE, BLOOD (ROUTINE X 2)
Culture: NO GROWTH
Culture: NO GROWTH

## 2023-08-02 LAB — GLUCOSE, CAPILLARY
Glucose-Capillary: 88 mg/dL (ref 70–99)
Glucose-Capillary: 145 mg/dL — ABNORMAL HIGH (ref 70–99)
Glucose-Capillary: 159 mg/dL — ABNORMAL HIGH (ref 70–99)
Glucose-Capillary: 145 mg/dL — ABNORMAL HIGH (ref 70–99)

## 2023-08-02 LAB — RENAL FUNCTION PANEL
Albumin: 3 g/dL — ABNORMAL LOW (ref 3.5–5.0)
Anion gap: 12 (ref 5–15)
BUN: 11 mg/dL (ref 8–23)
CO2: 20 mmol/L — ABNORMAL LOW (ref 22–32)
Calcium: 9.2 mg/dL (ref 8.9–10.3)
Chloride: 111 mmol/L (ref 98–111)
Creatinine, Ser: 0.96 mg/dL (ref 0.61–1.24)
GFR, Estimated: >60 mL/min (ref >60)
Glucose, Bld: 116 mg/dL — ABNORMAL HIGH (ref 70–99)
Phosphorus: 4 mg/dL (ref 2.5–4.6)
Potassium: 3.9 mmol/L (ref 3.5–5.1)
Sodium: 143 mmol/L (ref 135–145)

## 2023-08-02 LAB — MAGNESIUM: Magnesium: 1.5 mg/dL — ABNORMAL LOW (ref 1.7–2.4)

## 2023-08-02 MED ORDER — BACLOFEN 5 MG HALF TABLET
5.0000 mg | ORAL_TABLET | Freq: Three times a day (TID) | ORAL | Status: DC
  Administered 2023-08-02 – 2023-08-04 (×5): 5 mg via ORAL
  Filled 2023-08-02 (×7): qty 1

## 2023-08-02 MED ORDER — DULOXETINE HCL 30 MG PO CPEP
30.0000 mg | ORAL_CAPSULE | Freq: Every day | ORAL | Status: DC
  Administered 2023-08-02 – 2023-08-04 (×3): 30 mg via ORAL
  Filled 2023-08-02 (×3): qty 1

## 2023-08-02 MED ORDER — TAMSULOSIN HCL 0.4 MG PO CAPS
0.4000 mg | ORAL_CAPSULE | Freq: Every day | ORAL | Status: DC
  Administered 2023-08-02 – 2023-08-03 (×2): 0.4 mg via ORAL
  Filled 2023-08-02 (×3): qty 1

## 2023-08-02 MED ORDER — MAGNESIUM SULFATE 4 GM/100ML IV SOLN
4.0000 g | Freq: Once | INTRAVENOUS | Status: AC
  Administered 2023-08-02: 4 g via INTRAVENOUS
  Filled 2023-08-02: qty 100

## 2023-08-02 MED ORDER — PANTOPRAZOLE SODIUM 40 MG PO TBEC
40.0000 mg | DELAYED_RELEASE_TABLET | Freq: Every day | ORAL | Status: DC
  Administered 2023-08-02 – 2023-08-04 (×3): 40 mg via ORAL
  Filled 2023-08-02 (×3): qty 1

## 2023-08-02 NOTE — Plan of Care (Signed)
   Problem: Education: Goal: Knowledge of General Education information will improve Description Including pain rating scale, medication(s)/side effects and non-pharmacologic comfort measures Outcome: Progressing

## 2023-08-02 NOTE — Progress Notes (Signed)
 PROGRESS NOTE    Jacob Mcgrath.  FMW:[id number] DOB: 22-Sep-1955 DOA: 07/28/2023 PCP: [name], MD    Chief Complaint  Patient presents with   Seizures    Brief Narrative:  Patient is a 68 year old African-American male with past medical history significant for CVA with left-sided hemiparesis on aspirin  and Plavix , seizure on Keppra , hypertension, type 2 diabetes mellitus and dementia. Patient was admitted with seizure. Chest x-ray revealed bilateral infiltrates at the bases, possible aspiration. ICU swab is positive for MRSA. Sputum cultures growing Staph aureus. Patient is currently on IV Rocephin . Discussed with pharmacy team, IV vancomycin  given today. Patient was initially under ICU, but transferred to [facility].    Assessment & Plan:   Principal Problem:   Status epilepticus (HCC) Active Problems:   Controlled type 2 diabetes mellitus without complication, without long-term current use of insulin  (HCC)   Hypomagnesemia   Community acquired pneumonia of right lower lobe of lung   Dementia without behavioral disturbance (HCC)   Acute metabolic encephalopathy   Essential hypertension  #1 status epilepticus/history of recent seizure disorder in the context of prior CVA and dementia -Patient admitted with concerns for status epilepticus was intubated for airway protection on the critical care service. -Patient seen in consultation by neurology and placed on LTM EEG. -Urinalysis ordered with trace leukocytes, nitrite negative, 0-5 WBCs. -Chest x-ray concerning for pneumonia. -Neurology recommended increasing home regimen of Keppra  to 750 twice daily which would be his max due to his creatinine clearance. -Patient improved, no further seizures subsequently extubated. -Continue seizure precautions. -Will need outpatient follow-up with neurology postdischarge.  2.  Acute metabolic encephalopathy -Secondary to postictal state. -Improved.  3.  Concern for aspiration  pneumonia versus CAP -Noted on chest x-ray. -Patient intubated and subsequently extubated. -Currently with sats of 100% on room air. -Tracheal aspirate with MRSA. -Continue IV vancomycin  and IV Rocephin . -Complete 5-day course of IV Rocephin  and could likely transition from IV vancomycin  to Zyvox  to complete a 7 to 10-day course of treatment.  4.  Hypomagnesemia -Magnesium  at 1.5. -Magnesium  sulfate 4 g IV x 1. -Repeat labs in AM.  5.  Dementia -Stable.  6.  Hypertension -Continue Lopressor .  7.  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 -Continue aspirin  and Plavix  for secondary stroke prophylaxis. -Continue statin.  8.  Well-controlled diabetes mellitus type 2 -Hemoglobin A1c 5.4 07/01/2023 -CBG 88 this morning. -Discontinue SSI.    DVT prophylaxis: Lovenox  Code Status: Full Family Communication: Updated patient.  No family at bedside. Disposition: Likely back to skilled nursing facility.  Status is: Inpatient Remains inpatient appropriate because: Severity of illness   Consultants:  PCCM admission Neurology: Dr.Bhagat 07/29/2023  Procedures:  CT head 07/28/2023 Chest x-ray 07/28/2023, 07/29/2023 Abdominal films 07/28/2023 EEG  Significant Hospital Events: Including procedures, antibiotic start and stop dates in addition to other pertinent events   1/2 status epilepticus; intubated 1/3 transferred to [facility] from [facility]; pccm to admit 1/4 no further seizures off sedation.  Extubated.   Antimicrobials:  Anti-infectives (From admission, onward)    Start     Dose/Rate Route Frequency Ordered Stop   08/02/23 0000  vancomycin  (VANCOREADY) IVPB 750 mg/150 mL  Status:  Discontinued        750 mg 150 mL/hr over 60 Minutes Intravenous Every 12 hours 08/01/23 2305 08/01/23 2310   08/02/23 0000  vancomycin  (VANCOCIN ) 750 mg in sodium chloride  0.9 % 250 mL IVPB        750 mg  250 mL/hr over 60 Minutes Intravenous Every 12 hours 08/01/23 2311     08/01/23  1045  vancomycin  (VANCOREADY) IVPB 1250 mg/250 mL        1,250 mg 166.7 mL/hr over 90 Minutes Intravenous  Once 08/01/23 0957 08/01/23 1327   07/29/23 2100  cefTRIAXone  (ROCEPHIN ) 2 g in sodium chloride  0.9 % 100 mL IVPB        2 g 200 mL/hr over 30 Minutes Intravenous Every 24 hours 07/29/23 0236 08/03/23 2059   07/28/23 2015  cefTRIAXone  (ROCEPHIN ) 2 g in sodium chloride  0.9 % 100 mL IVPB        2 g 200 mL/hr over 30 Minutes Intravenous Once 07/28/23 2004 07/28/23 2139   07/28/23 2015  vancomycin  (VANCOCIN ) IVPB 1000 mg/200 mL premix        1,000 mg 200 mL/hr over 60 Minutes Intravenous  Once 07/28/23 2004 07/28/23 2251         Subjective: Patient sitting up in bed eating lunch.  Denies any further seizure-like episodes.  Denies any chest pain no significant shortness of breath.  No chest pain.  Overall feels well.  Objective: Vitals:   08/02/23 0731 08/02/23 1124 08/02/23 1621 08/02/23 1938  BP: (!) 175/79 (!) 152/77 (!) 166/71 (!) 176/68  Pulse: 88 86 65 87  Resp:  17 17 18   Temp: 97.8 F (36.6 C) 97.8 F (36.6 C) 98.4 F (36.9 C) 97.9 F (36.6 C)  TempSrc: Oral Axillary Oral Oral  SpO2: 94% 100% 100% (!) 89%  Weight:      Height:        Intake/Output Summary (Last 24 hours) at 08/02/2023 2008 Last data filed at 08/02/2023 1632 Gross per 24 hour  Intake 700 ml  Output 2000 ml  Net -1300 ml   Filed Weights   07/30/23 0200 07/31/23 0500 08/02/23 0455  Weight: 61.4 kg 62.3 kg 60.7 kg    Examination:  General exam: Appears calm and comfortable  Respiratory system: Clear to auscultation anterior lung fields.  No wheezes, no crackles, no rhonchi.  Fair air movement.  Speaking in full sentences.SABRA Respiratory effort normal. Cardiovascular system: S1 & S2 heard, RRR. No JVD, murmurs, rubs, gallops or clicks. No pedal edema. Gastrointestinal system: Abdomen is nondistended, soft and nontender. No organomegaly or masses felt. Normal bowel sounds heard. Central nervous  system: Alert and oriented. No focal neurological deficits. Extremities: Symmetric 5 x 5 power. Skin: No rashes, lesions or ulcers Psychiatry: Judgement and insight appear normal. Mood & affect appropriate.     Data Reviewed: I have personally reviewed following labs and imaging studies  CBC: Recent Labs  Lab 07/28/23 2004 07/30/23 0028 07/31/23 0238 08/02/23 0545  WBC 21.2* 13.8* 12.3* 9.1  NEUTROABS 14.7*  --   --  4.2  HGB 12.7* 12.1* 11.0* 11.8*  HCT 41.2 37.5* 34.1* 38.1*  MCV 97.4 91.2 91.7 94.8  PLT 277 227 185 202    Basic Metabolic Panel: Recent Labs  Lab 07/28/23 2004 07/30/23 0028 07/31/23 0238 08/02/23 0545  NA 142 144 141 143  K 4.3 3.9 3.4* 3.9  CL 110 110 107 111  CO2 14* 21* 22 20*  GLUCOSE 151* 139* 97 116*  BUN 17 11 10 11   CREATININE 1.29* 0.94 0.88 0.96  CALCIUM  9.0 9.0 9.0 9.2  MG 1.7 1.8 1.7 1.5*  PHOS  --   --  2.7 4.0    GFR: Estimated Creatinine Clearance: 64.1 mL/min (by C-G formula based on SCr of 0.96  mg/dL).  Liver Function Tests: Recent Labs  Lab 07/28/23 2004 08/02/23 0545  AST 27  --   ALT 21  --   ALKPHOS 92  --   BILITOT 0.6  --   PROT 7.9  --   ALBUMIN 3.8 3.0*    CBG: Recent Labs  Lab 08/01/23 1622 08/01/23 2112 08/02/23 0802 08/02/23 1117 08/02/23 1619  GLUCAP 75 128* 88 145* 159*     Recent Results (from the past 240 hours)  Resp panel by RT-PCR (RSV, Flu A&B, Covid) Anterior Nasal Swab     Status: None   Collection Time: 07/28/23  7:40 PM   Specimen: Anterior Nasal Swab  Result Value Ref Range Status   SARS Coronavirus 2 by RT PCR NEGATIVE NEGATIVE Final    Comment: (NOTE) SARS-CoV-2 target nucleic acids are NOT DETECTED.  The SARS-CoV-2 RNA is generally detectable in upper respiratory specimens during the acute phase of infection. The lowest concentration of SARS-CoV-2 viral copies this assay can detect is 138 copies/mL. A negative result does not preclude SARS-Cov-2 infection and should not be  used as the sole basis for treatment or other patient management decisions. A negative result may occur with  improper specimen collection/handling, submission of specimen other than nasopharyngeal swab, presence of viral mutation(s) within the areas targeted by this assay, and inadequate number of viral copies(<138 copies/mL). A negative result must be combined with clinical observations, patient history, and epidemiological information. The expected result is Negative.  Fact Sheet for Patients:  bloggercourse.com  Fact Sheet for Healthcare Providers:  seriousbroker.it  This test is no t yet approved or cleared by the [place]  FDA and  has been authorized for detection and/or diagnosis of SARS-CoV-2 by FDA under an Emergency Use Authorization (EUA). This EUA will remain  in effect (meaning this test can be used) for the duration of the COVID-19 declaration under Section 564(b)(1) of the Act, 21 U.S.C.section 360bbb-3(b)(1), unless the authorization is terminated  or revoked sooner.       Influenza A by PCR NEGATIVE NEGATIVE Final   Influenza B by PCR NEGATIVE NEGATIVE Final    Comment: (NOTE) The Xpert Xpress SARS-CoV-2/FLU/RSV plus assay is intended as an aid in the diagnosis of influenza from Nasopharyngeal swab specimens and should not be used as a sole basis for treatment. Nasal washings and aspirates are unacceptable for Xpert Xpress SARS-CoV-2/FLU/RSV testing.  Fact Sheet for Patients: bloggercourse.com  Fact Sheet for Healthcare Providers: seriousbroker.it  This test is not yet approved or cleared by the [place]  FDA and has been authorized for detection and/or diagnosis of SARS-CoV-2 by FDA under an Emergency Use Authorization (EUA). This EUA will remain in effect (meaning this test can be used) for the duration of the COVID-19 declaration under Section  564(b)(1) of the Act, 21 U.S.C. section 360bbb-3(b)(1), unless the authorization is terminated or revoked.     Resp Syncytial Virus by PCR NEGATIVE NEGATIVE Final    Comment: (NOTE) Fact Sheet for Patients: bloggercourse.com  Fact Sheet for Healthcare Providers: seriousbroker.it  This test is not yet approved or cleared by the [place]  FDA and has been authorized for detection and/or diagnosis of SARS-CoV-2 by FDA under an Emergency Use Authorization (EUA). This EUA will remain in effect (meaning this test can be used) for the duration of the COVID-19 declaration under Section 564(b)(1) of the Act, 21 U.S.C. section 360bbb-3(b)(1), unless the authorization is terminated or revoked.  Performed at [facility], [address], [place], [place]  [address]   Culture, blood (Routine X 2) w Reflex to ID Panel     Status: None   Collection Time: 07/28/23  8:04 PM   Specimen: BLOOD  Result Value Ref Range Status   Specimen Description BLOOD LEFT ANTECUBITAL  Final   Special Requests   Final    BOTTLES DRAWN AEROBIC AND ANAEROBIC Blood Culture results may not be optimal due to an inadequate volume of blood received in culture bottles   Culture   Final    NO GROWTH 5 DAYS Performed at [facility], [address], [place], [place] [address]    Report Status 08/02/2023 FINAL  Final  Culture, blood (Routine X 2) w Reflex to ID Panel     Status: None   Collection Time: 07/28/23  8:04 PM   Specimen: BLOOD  Result Value Ref Range Status   Specimen Description BLOOD BLOOD LEFT FOREARM  Final   Special Requests   Final    BOTTLES DRAWN AEROBIC ONLY Blood Culture results may not be optimal due to an inadequate volume of blood received in culture bottles   Culture   Final    NO GROWTH 5 DAYS Performed at [facility], [address], [place], [place] [address]    Report Status 08/02/2023 FINAL  Final  MRSA Next Gen by PCR, Nasal      Status: Abnormal   Collection Time: 07/29/23  4:03 AM   Specimen: Nasal Mucosa; Nasal Swab  Result Value Ref Range Status   MRSA by PCR Next Gen DETECTED (A) NOT DETECTED Final    Comment: RESULT CALLED TO, READ BACK BY AND VERIFIED WITH: K MOLEN,RN@0535  07/29/23 MK (NOTE) The GeneXpert MRSA Assay (FDA approved for NASAL specimens only), is one component of a comprehensive MRSA colonization surveillance program. It is not intended to diagnose MRSA infection nor to guide or monitor treatment for MRSA infections. Test performance is not FDA approved in patients less than 86 years old. Performed at [facility], [address], [place], [place] [address]   Culture, Respiratory w Gram Stain     Status: None   Collection Time: 07/29/23  9:52 PM   Specimen: Tracheal Aspirate; Respiratory  Result Value Ref Range Status   Specimen Description TRACHEAL ASPIRATE  Final   Special Requests NONE  Final   Gram Stain   Final    MODERATE WBC PRESENT,BOTH PMN AND MONONUCLEAR FEW GRAM POSITIVE COCCI IN PAIRS Performed at [facility], [address] [place], [place] [address]    Culture   Final    ABUNDANT METHICILLIN RESISTANT STAPHYLOCOCCUS AUREUS   Report Status 08/01/2023 FINAL  Final   Organism ID, Bacteria METHICILLIN RESISTANT STAPHYLOCOCCUS AUREUS  Final      Susceptibility   Methicillin resistant staphylococcus aureus - MIC*    CIPROFLOXACIN  >=8 RESISTANT Resistant     ERYTHROMYCIN >=8 RESISTANT Resistant     GENTAMICIN <=0.5 SENSITIVE Sensitive     OXACILLIN >=4 RESISTANT Resistant     TETRACYCLINE <=1 SENSITIVE Sensitive     VANCOMYCIN  1 SENSITIVE Sensitive     TRIMETH/SULFA >=320 RESISTANT Resistant     CLINDAMYCIN <=0.25 SENSITIVE Sensitive     RIFAMPIN <=0.5 SENSITIVE Sensitive     Inducible Clindamycin NEGATIVE Sensitive     LINEZOLID  2 SENSITIVE Sensitive     * ABUNDANT METHICILLIN RESISTANT STAPHYLOCOCCUS AUREUS         Radiology Studies: No results  found.      Scheduled Meds:  aspirin   81 mg Oral Daily   atorvastatin   80 mg Oral QHS   baclofen   5 mg Oral TID   clopidogrel   75 mg Oral Q breakfast   DULoxetine   30 mg Oral Daily   enoxaparin  (LOVENOX ) injection  40 mg Subcutaneous Q24H   feeding supplement  237 mL Oral BID BM   levETIRAcetam   750 mg Oral BID   metoprolol  tartrate  25 mg Oral BID   multivitamin with minerals  1 tablet Oral Daily   mupirocin  ointment  1 Application Nasal BID   nicotine   21 mg Transdermal Daily   pantoprazole   40 mg Oral Daily   tamsulosin   0.4 mg Oral Daily   Continuous Infusions:  cefTRIAXone  (ROCEPHIN )  IV Stopped (08/01/23 2202)   vancomycin  (VANCOCIN ) 750 mg in sodium chloride  0.9 % 250 mL IVPB 750 mg (08/02/23 1018)     LOS: 4 days    Time spent: 40 minutes    Toribio Hummer, MD [facility]   To contact the attending provider between 7A-7P or the covering provider during after hours 7P-7A, please log into the web site [email or web address] and access using universal Commerce password for that web site. If you do not have the password, please call the hospital operator.  08/02/2023, 8:08 PM

## 2023-08-02 NOTE — Evaluation (Signed)
 Occupational Therapy Evaluation Patient Details Name: Jacob Mcgrath. MRN: [id number] DOB: Apr 02, 1956 Today's Date: 08/02/2023   History of Present Illness Pt is 68 year old presented to [facility] on  07/28/23 for seizure. Pt intubated on arrival. Transferred to [facility] on 1/3. Extubated 1/4. PMH - dementia, DM 2, neuropathy, HTN, HDL, GERD, depression, anxiety, PVD,CVA with chronic lt hand contracture.   Clinical Impression   Pt admitted with the above diagnoses and presents with below problem list. Pt will benefit from continued acute OT to address the below listed deficits and maximize independence with basic ADLs prior to d/c. At baseline, pt lines in long-term care facility and needs significant assist with functional transfers and basic ADLs. Pt able to feed self this morning with setup and able to lean forward in the bed with HOB elevated. Unable to assess transfers this morning as pt needs +2 assist and only +1 assist. Plan to follow acutely.        If plan is discharge home, recommend the following: A lot of help with walking and/or transfers;Two people to help with walking and/or transfers;A lot of help with bathing/dressing/bathroom;Two people to help with bathing/dressing/bathroom;Assistance with feeding    Functional Status Assessment  Patient has had a recent decline in their functional status and demonstrates the ability to make significant improvements in function in a reasonable and predictable amount of time.  Equipment Recommendations  None recommended by OT    Recommendations for Other Services       Precautions / Restrictions Precautions Precautions: Fall Precaution Comments: LUE hemiplegia from prior CVA Restrictions Weight Bearing Restrictions Per Provider Order: No      Mobility Bed Mobility               General bed mobility comments: Able to come to forward trunk flexion in bed with HOB elevated. Unable to further assess as pt needs +2 assist and only +1  assist available during OT eval.    Transfers                          Balance                                           ADL either performed or assessed with clinical judgement   ADL Overall ADL's : Needs assistance/impaired Eating/Feeding: Set up;Supervision/ safety;Bed level   Grooming: Maximal assistance;Bed level Grooming Details (indicate cue type and reason): cognition and LUE hemiplegia Upper Body Bathing: Total assistance   Lower Body Bathing: Total assistance   Upper Body Dressing : Maximal assistance   Lower Body Dressing: Total assistance                 General ADL Comments: Follows 1 step commands most of the time. Able to lean forward in bed for OT to assess pt's back at bed level, HOB elevated. LUE limited functional use at baseline     Vision         Perception         Praxis         Pertinent Vitals/Pain Pain Assessment Pain Assessment: Faces Faces Pain Scale: Hurts a little bit Pain Location: left foot Pain Descriptors / Indicators: Discomfort Pain Intervention(s): Monitored during session, Limited activity within patient's tolerance     Extremity/Trunk Assessment Upper Extremity Assessment Upper Extremity Assessment: LUE deficits/detail;Difficult  to assess due to impaired cognition;Generalized weakness LUE Deficits / Details: Chronic weakness and contracture from prior CVA LUE Coordination: decreased fine motor;decreased gross motor   Lower Extremity Assessment Lower Extremity Assessment: Defer to PT evaluation       Communication Communication Communication: No apparent difficulties   Cognition Arousal: Alert Behavior During Therapy: WFL for tasks assessed/performed Overall Cognitive Status: History of cognitive impairments - at baseline                                 General Comments: oriented to self only. Likes to joke. Tangenital and inaccurate responses ie I walk to the  store     General Comments       Exercises Exercises: Other exercises Other Exercises Other Exercises: encouraged bed level ROM and trunk flexion exercises to maintain strength   Shoulder Instructions      Home Living Family/patient expects to be discharged to:: Skilled nursing facility                                 Additional Comments: From [facility]. Pt unable to give history of baseline with ADLs. Per chart review, observation, and clinical judgement needs significant at baseline.      Prior Functioning/Environment Prior Level of Function : Needs assist;Patient poor historian/Family not available             Mobility Comments: Assist for bed to w/c transfers. Pt unable to say how much assist ADLs Comments: assist with ADLs at baseline. +2 transfers and bed mobility.        OT Problem List: Decreased cognition;Impaired UE functional use;Impaired tone;Decreased coordination;Impaired balance (sitting and/or standing)      OT Treatment/Interventions: Self-care/ADL training;Therapeutic exercise;Energy conservation;DME and/or AE instruction;Therapeutic activities;Patient/family education;Balance training    OT Goals(Current goals can be found in the care plan section) Acute Rehab OT Goals Patient Stated Goal: pt unable to state OT Goal Formulation: Patient unable to participate in goal setting Time For Goal Achievement: 08/16/23 Potential to Achieve Goals: Fair  OT Frequency: Min 1X/week    Co-evaluation              AM-PAC OT 6 Clicks Daily Activity     Outcome Measure Help from another person eating meals?: A Little Help from another person taking care of personal grooming?: A Lot Help from another person toileting, which includes using toliet, bedpan, or urinal?: Total Help from another person bathing (including washing, rinsing, drying)?: Total Help from another person to put on and taking off regular upper body clothing?:  Total Help from another person to put on and taking off regular lower body clothing?: Total 6 Click Score: 9   End of Session    Activity Tolerance: Patient limited by fatigue;Patient tolerated treatment well Patient left: in bed;with bed alarm set;with call bell/phone within reach  OT Visit Diagnosis: Other symptoms and signs involving cognitive function;Other abnormalities of gait and mobility (R26.89)                Time: 9089-9075 OT Time Calculation (min): 14 min Charges:  OT General Charges $OT Visit: 1 Visit OT Evaluation $OT Eval Low Complexity: 1 Low  Lamarr Hoots, OT Acute Rehabilitation Services Office: [phone]   Hoots Lamarr DEL 08/02/2023, 11:49 AM

## 2023-08-02 NOTE — Consult Note (Signed)
 [facility] Liaison Consult Note    08/02/2023  Helton Oleson 15-Dec-1955 [id number]  Primary Care Provider:  [name], MD  Insurance: [facility] [employer]  Patient was reviewed for less than 30 days readmission medium high risk score with a 4 day length of stay for post hospital  barriers to care.   Patient was screened for hospitalization and on behalf of [facility]  Care Coordination to assess for post hospital community care needs.  Patient is note being long term care skilled nursing facility level of care with a legal guardian.  Patient seen on unit rounds in bed, currently on contact precautions with concerns for MRSA pneumonia noted.   Plan:  Will follow inpatient [facility] team for any changes in current plan. Patient is long term care SNF at [facility] and current plan shows to return when medically ready as reviewed by inpatient TOC LCSW notes. NO VBCI needs noted.  For questions or referrals, please contact:  Richerd Fish, RN, BSN, CCM [place]  [facility] Liaison Direct Dial: [phone] or secure chat Email: [email or web address]

## 2023-08-02 NOTE — Plan of Care (Signed)
  Problem: Education: Goal: Knowledge of General Education information will improve Description: Including pain rating scale, medication(s)/side effects and non-pharmacologic comfort measures Outcome: Progressing   Problem: Health Behavior/Discharge Planning: Goal: Ability to manage health-related needs will improve Outcome: Progressing   Problem: Clinical Measurements: Goal: Ability to maintain clinical measurements within normal limits will improve Outcome: Progressing Goal: Will remain free from infection Outcome: Progressing Goal: Diagnostic test results will improve Outcome: Progressing Goal: Respiratory complications will improve Outcome: Progressing Goal: Cardiovascular complication will be avoided Outcome: Progressing   Problem: Activity: Goal: Risk for activity intolerance will decrease Outcome: Progressing   Problem: Nutrition: Goal: Adequate nutrition will be maintained Outcome: Progressing   Problem: Coping: Goal: Level of anxiety will decrease Outcome: Progressing   Problem: Elimination: Goal: Will not experience complications related to bowel motility Outcome: Progressing Goal: Will not experience complications related to urinary retention Outcome: Progressing   Problem: Pain Management: Goal: General experience of comfort will improve Outcome: Progressing   Problem: Safety: Goal: Ability to remain free from injury will improve Outcome: Progressing   Problem: Skin Integrity: Goal: Risk for impaired skin integrity will decrease Outcome: Progressing   Problem: Education: Goal: Ability to describe self-care measures that may prevent or decrease complications (Diabetes Survival Skills Education) will improve Outcome: Progressing Goal: Individualized Educational Video(s) Outcome: Progressing   Problem: Coping: Goal: Ability to adjust to condition or change in health will improve Outcome: Progressing   Problem: Fluid Volume: Goal: Ability to  maintain a balanced intake and output will improve Outcome: Progressing   Problem: Health Behavior/Discharge Planning: Goal: Ability to identify and utilize available resources and services will improve Outcome: Progressing Goal: Ability to manage health-related needs will improve Outcome: Progressing   Problem: Metabolic: Goal: Ability to maintain appropriate glucose levels will improve Outcome: Progressing   Problem: Nutritional: Goal: Maintenance of adequate nutrition will improve Outcome: Progressing Goal: Progress toward achieving an optimal weight will improve Outcome: Progressing   Problem: Skin Integrity: Goal: Risk for impaired skin integrity will decrease Outcome: Progressing   Problem: Tissue Perfusion: Goal: Adequacy of tissue perfusion will improve Outcome: Progressing   Problem: Activity: Goal: Ability to tolerate increased activity will improve Outcome: Progressing   Problem: Respiratory: Goal: Ability to maintain a clear airway and adequate ventilation will improve Outcome: Progressing   Problem: Role Relationship: Goal: Method of communication will improve Outcome: Progressing

## 2023-08-03 DIAGNOSIS — I10 Essential (primary) hypertension: Secondary | ICD-10-CM | POA: Diagnosis not present

## 2023-08-03 DIAGNOSIS — G9341 Metabolic encephalopathy: Secondary | ICD-10-CM | POA: Diagnosis not present

## 2023-08-03 DIAGNOSIS — F0390 Unspecified dementia without behavioral disturbance: Secondary | ICD-10-CM | POA: Diagnosis not present

## 2023-08-03 DIAGNOSIS — G40901 Epilepsy, unspecified, not intractable, with status epilepticus: Secondary | ICD-10-CM | POA: Diagnosis not present

## 2023-08-03 LAB — GLUCOSE, CAPILLARY
Glucose-Capillary: 136 mg/dL — ABNORMAL HIGH (ref 70–99)
Glucose-Capillary: 103 mg/dL — ABNORMAL HIGH (ref 70–99)
Glucose-Capillary: 107 mg/dL — ABNORMAL HIGH (ref 70–99)
Glucose-Capillary: 119 mg/dL — ABNORMAL HIGH (ref 70–99)

## 2023-08-03 LAB — BASIC METABOLIC PANEL
Anion gap: 9 (ref 5–15)
BUN: 8 mg/dL (ref 8–23)
CO2: 25 mmol/L (ref 22–32)
Calcium: 9.3 mg/dL (ref 8.9–10.3)
Chloride: 110 mmol/L (ref 98–111)
Creatinine, Ser: 0.78 mg/dL (ref 0.61–1.24)
GFR, Estimated: >60 mL/min (ref >60)
Glucose, Bld: 116 mg/dL — ABNORMAL HIGH (ref 70–99)
Potassium: 4.5 mmol/L (ref 3.5–5.1)
Sodium: 144 mmol/L (ref 135–145)

## 2023-08-03 LAB — CBC
HCT: 38.4 % — ABNORMAL LOW (ref 39.0–52.0)
Hemoglobin: 12.6 g/dL — ABNORMAL LOW (ref 13.0–17.0)
MCH: 29.5 pg (ref 26.0–34.0)
MCHC: 32.8 g/dL (ref 30.0–36.0)
MCV: 89.9 fL (ref 80.0–100.0)
Platelets: 235 10*3/uL (ref 150–400)
RBC: 4.27 MIL/uL (ref 4.22–5.81)
RDW: 13.3 % (ref 11.5–15.5)
WBC: 9.3 10*3/uL (ref 4.0–10.5)
nRBC: 0 % (ref 0.0–0.2)

## 2023-08-03 LAB — MAGNESIUM: Magnesium: 1.9 mg/dL (ref 1.7–2.4)

## 2023-08-03 MED ORDER — CHLORHEXIDINE GLUCONATE CLOTH 2 % EX PADS: 6.0000 | MEDICATED_PAD | Freq: Every day | CUTANEOUS | Status: DC

## 2023-08-03 MED ORDER — LINEZOLID 600 MG PO TABS: 600.0000 mg | ORAL_TABLET | Freq: Two times a day (BID) | ORAL | 0 refills | Status: DC

## 2023-08-03 MED ORDER — NICOTINE 21 MG/24HR TD PT24: 21.0000 mg | MEDICATED_PATCH | Freq: Every day | TRANSDERMAL | 0 refills | Status: DC

## 2023-08-03 MED ORDER — LINEZOLID 600 MG PO TABS
600.0000 mg | ORAL_TABLET | Freq: Two times a day (BID) | ORAL | Status: DC
  Administered 2023-08-03 – 2023-08-04 (×3): 600 mg via ORAL
  Filled 2023-08-03 (×4): qty 1

## 2023-08-03 MED ORDER — ENSURE ENLIVE PO LIQD: 237.0000 mL | Freq: Two times a day (BID) | ORAL | Status: DC

## 2023-08-03 MED ORDER — LEVETIRACETAM 750 MG PO TABS: 750.0000 mg | ORAL_TABLET | Freq: Two times a day (BID) | ORAL | 1 refills | Status: DC

## 2023-08-03 MED ORDER — MUPIROCIN 2 % EX OINT: 1.0000 | TOPICAL_OINTMENT | Freq: Two times a day (BID) | CUTANEOUS | 0 refills | Status: AC

## 2023-08-03 NOTE — Progress Notes (Signed)
 Speech Language Pathology Treatment: Dysphagia  Patient Details Name: Jacob Mcgrath. MRN: [id number] DOB: 10/23/1955 Today's Date: 08/03/2023 Time: 8688-8667 SLP Time Calculation (min) (ACUTE ONLY): 21 min  Assessment / Plan / Recommendation Clinical Impression  Pt seen with lunch meal after texture upgraded to regular during previous session. He is endentulous and states his gums are hard and can chew well, I just take my time. He had a hamburger and french fries and mastication was slightly prolonged however he was able to chew within a reasonable amount of time with adequate endurance. He was encouraged to take sip liquid if need to assist with transit and was able to clear oral cavity consistently. Sips of coffee via cup and water from straw did not reveal signs of aspiration. He had one delayed cough throughout observation that did not appear related to po's. Recommend pt continue regular texture, thin liquids, pills with thin and liquid wash as needed. No further ST is needed.    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 ETT 1/2-1/4. Chest x ray with small bilateral infiltrates at the bases; possibe aspiration.      SLP Plan  All goals met      Recommendations for follow up therapy are one component of a multi-disciplinary discharge planning process, led by the attending physician.  Recommendations may be updated based on patient status, additional functional criteria and insurance authorization.    Recommendations  Diet recommendations: Regular;Thin liquid Liquids provided via: Cup;Straw Medication Administration: Whole meds with liquid Supervision: Patient able to self feed Compensations: Minimize environmental distractions;Slow rate;Small sips/bites Postural Changes and/or Swallow Maneuvers: Seated upright 90 degrees                  Oral care  BID   Intermittent Supervision/Assistance Dysphagia, unspecified (R13.10)     All goals met     Dustin Olam Bull  08/03/2023, 1:45 PM

## 2023-08-03 NOTE — Discharge Summary (Signed)
 Physician Discharge Summary  Jacob Mcgrath. FMW:[id number] DOB: [birth year] DOA: 07/28/2023  PCP: [name], MD  Admit date: 07/28/2023 Discharge date: 08/03/2023  Time spent: 60 minutes  Recommendations for Outpatient Follow-up:  Follow-up with MD at skilled nursing facility.  Patient will need a basic metabolic profile, magnesium  level checked in 1 week to follow-up on electrolytes and renal function. Follow-up with [facility] for follow-up on seizures/status epilepticus.   Discharge Diagnoses:  Principal Problem:   Status epilepticus (HCC) Active Problems:   Controlled type 2 diabetes mellitus without complication, without long-term current use of insulin  (HCC)   Hypomagnesemia   Community acquired pneumonia of right lower lobe of lung   Dementia without behavioral disturbance (HCC)   Acute metabolic encephalopathy   Essential hypertension   Discharge Condition: Stable and improved.  Diet recommendation: Regular  Filed Weights   07/31/23 0500 08/02/23 0455 08/03/23 0456  Weight: 62.3 kg 60.7 kg 59.7 kg    History of present illness:  HPI per Dr. Kara Patient is a 68 yo M w/ pertinent PMH recent seizure on keppra , CVA w/ left sided weakness on asa/plavix , htn, t2dm, dementia presents to [facility] ED on 1/2 w/ seizure.   Patient recently hospitalized on 12/6-12/10 w/ new onset seizures. Started on keppra . Also w/ UTI and pna. Discharged to [facility].    On 1/2 patient having muscle spasms and brought in by EMS to [facility] ED. In ED had witnessed generalized tonic-clonic seizure activity and became unresponsive. Eyes deviated to left. Given ativan  and keppra . Intubated for airway protection. Started on propofol  for seizure control. Patient afebrile and bp stable. CT head no acute abnormality. CXR w/ small b/l infiltrate at bases; possible aspiration. Given vanc/rocephin . Neurology consulted. Recommended transfer to [facility] for cEEG. PCCM  consulted for icu admission.  Hospital Course:  #1 status epilepticus/history of recent seizure disorder in the context of prior CVA and dementia -Patient admitted with concerns for status epilepticus was intubated for airway protection on the critical care service. -Patient seen in consultation by neurology and placed on LTM EEG. -Urinalysis ordered with trace leukocytes, nitrite negative, 0-5 WBCs. -Chest x-ray concerning for pneumonia. -Neurology recommended increasing home regimen of Keppra  to 750 twice daily which would be his max due to his creatinine clearance. -Patient improved on increased dose of Keppra , no further seizures subsequently extubated. -Patient remained in stable condition without any further seizure activity.  -Outpatient follow-up with neurology in 4 weeks.     2.  Acute metabolic encephalopathy -Secondary to postictal state. -Improved and had resolved by day of discharge.   3.  Concern for aspiration pneumonia versus CAP -Noted on chest x-ray. -Patient intubated and subsequently extubated. -O2 sats remained at 100% on room air.  -Tracheal aspirate with MRSA. -Patient initially placed on IV Rocephin  and completed a 5-day course of IV Rocephin  prior to finalization of tracheal aspirate cultures. -Once tracheal aspirate was positive for MRSA patient started on IV vancomycin  and subsequently transition to oral Zyvox  on day of discharge and be discharged on 8 more days of Zyvox  to complete a 10-day course of treatment.  -Outpatient follow-up with MD at facility.   4.  Hypomagnesemia -Repleted during the hospitalization.   -Outpatient follow-up.   5.  Dementia -Stable.   6.  Hypertension -Controlled on home regimen Lopressor .   7.  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 -Patient maintained on aspirin  and Plavix  for secondary stroke prophylaxis. -Patient also maintained on  home regimen statin.   8.  Well-controlled diabetes  mellitus type 2 -Hemoglobin A1c 5.4 07/01/2023 -Patient initially placed on sliding scale insulin  while in the ICU for tight glycemic control, blood glucose levels may remain well-controlled and sliding scale insulin  discontinued.   -Outpatient follow-up.        Procedures: CT head 07/28/2023 Chest x-ray 07/28/2023, 07/29/2023 Abdominal films 07/28/2023 EEG   Significant Hospital Events: Including procedures, antibiotic start and stop dates in addition to other pertinent events   1/2 status epilepticus; intubated 1/3 transferred to [facility] from [facility]; pccm to admit 1/4 no further seizures off sedation.  Extubated.  Consultations: PCCM admission Neurology: Dr.Bhagat 07/29/2023  Discharge Exam: Vitals:   08/03/23 0813 08/03/23 1140  BP: (!) 166/74 137/68  Pulse: 81 73  Resp: 17 17  Temp: 98.4 F (36.9 C) 98 F (36.7 C)  SpO2: 99% 99%    General: NAD Cardiovascular: RRR no murmurs rubs or gallops.  No JVD.  No lower extremity edema. Respiratory: Clear to auscultation bilaterally.  No wheezes, no crackles, no rhonchi.  Fair air movement.  Speaking in full sentences.  Discharge Instructions   Discharge Instructions     Ambulatory referral to Neurology   Complete by: As directed    An appointment is requested in approximately: 4 weeks   Diet general   Complete by: As directed    Increase activity slowly   Complete by: As directed       Allergies as of 08/03/2023       Reactions   Amoxicillin Other (See Comments)   Unknown reaction per facility   Ampicillin Other (See Comments)   Unknown reaction per facility   Tetracyclines & Related Rash        Medication List     TAKE these medications    aspirin  EC 81 MG tablet Take 81 mg by mouth daily. Swallow whole.   atorvastatin  80 MG tablet Commonly known as: LIPITOR  Take 80 mg by mouth at bedtime.   Baclofen  5 MG Tabs Take 1 tablet by mouth in the morning, at noon, and at bedtime.   clopidogrel  75 MG tablet Commonly  known as: PLAVIX  Take 1 tablet (75 mg total) by mouth daily with breakfast. What changed: when to take this   DULoxetine  30 MG capsule Commonly known as: CYMBALTA  Take 30 mg by mouth daily.   feeding supplement Liqd Take 237 mLs by mouth 2 (two) times daily between meals.   guaiFENesin 600 MG 12 hr tablet Commonly known as: MUCINEX Take 600 mg by mouth 2 (two) times daily as needed for cough.   levETIRAcetam  750 MG tablet Commonly known as: KEPPRA  Take 1 tablet (750 mg total) by mouth 2 (two) times daily. What changed:  medication strength how much to take   linezolid  600 MG tablet Commonly known as: ZYVOX  Take 1 tablet (600 mg total) by mouth every 12 (twelve) hours for 8 days.   melatonin 3 MG Tabs tablet Take 2 tablets (6 mg total) by mouth at bedtime.   metoprolol  tartrate 25 MG tablet Commonly known as: LOPRESSOR  Take 1 tablet (25 mg total) by mouth 2 (two) times daily.   multivitamin tablet Take 1 tablet by mouth daily.   mupirocin  ointment 2 % Commonly known as: BACTROBAN  Place 1 Application into the nose 2 (two) times daily for 5 days.   nicotine  21 mg/24hr patch Commonly known as: NICODERM CQ  - dosed in mg/24 hours Place 1 patch (21 mg total) onto the skin  daily. Start taking on: August 04, 2023   pantoprazole  40 MG tablet Commonly known as: PROTONIX  Take 1 tablet (40 mg total) by mouth daily.   PARoxetine  40 MG tablet Commonly known as: PAXIL  Take 40 mg by mouth daily.   tamsulosin  0.4 MG Caps capsule Commonly known as: FLOMAX  Take 1 capsule (0.4 mg total) by mouth daily.   TYLENOL  PO Take 1 tablet by mouth every 6 (six) hours as needed (mild pain).       Allergies  Allergen Reactions   Amoxicillin Other (See Comments)    Unknown reaction per facility   Ampicillin Other (See Comments)    Unknown reaction per facility   Tetracyclines & Related Rash    Contact information for follow-up providers     MD AT SNF Follow up.           [facility]. Schedule an appointment as soon as possible for a visit in 4 week(s).   Specialty: Neurology Contact information: [address] [place]  [address] [phone]             Contact information for after-discharge care     Destination     [facility] .   Service: Skilled Nursing Contact information: [address] [place]  [address] [phone]                      The results of significant diagnostics from this hospitalization (including imaging, microbiology, ancillary and laboratory) are listed below for reference.    Significant Diagnostic Studies: DG Chest Port 1 View Result Date: 07/29/2023 CLINICAL DATA:  Intubated patient. EXAM: PORTABLE CHEST 1 VIEW COMPARISON:  Radiographs 07/28/2023 and 07/01/2023.  CT 10/21/2021. FINDINGS: 0456 hours. Two views submitted. Endotracheal tube is unchanged at the level of the mid trachea. Enteric tube projects below the diaphragm, tip over the proximal stomach. The heart size and mediastinal contours are stable with aortic atherosclerosis. Persistent left greater than right basilar airspace opacities, similar to yesterday's examination. Possible small left pleural effusion. No evidence of pneumothorax. Old fracture of the proximal left humerus noted. IMPRESSION: 1. Persistent left greater than right basilar airspace opacities, similar to yesterday's examination, suspicious for possible pneumonia or aspiration. Correlate clinically. Possible small left pleural effusion. 2. Stable support system. Electronically Signed   By: Elsie Perone M.D.   On: 07/29/2023 09:58   Overnight EEG with video Result Date: 07/29/2023 Shelton Arlin KIDD, MD     07/30/2023  7:39 AM Patient Name: Alexes Lamarque. MRN: [id number] Epilepsy Attending: Arlin KIDD Shelton Referring Physician/Provider: Jerrie Lola CROME, MD  Duration:  07/29/2023 9470 to 07/30/2023 9470 Patient history:  68 y.o. male with breakthrough seizure with no clear triggering etiology. EEG to evaluate for seizure. Level of alertness:  comatose AEDs during EEG study: Propofol , LEV Technical aspects: This EEG study was done with scalp electrodes positioned according to the 10-20 International system of electrode placement. Electrical activity was reviewed with band pass filter of 1-70Hz , sensitivity of 7 uV/mm, display speed of 45mm/sec with a 60Hz  notched filter applied as appropriate. EEG data were recorded continuously and digitally stored.  Video monitoring was available and reviewed as appropriate. Description: EEG showed continuous generalized and lateralized right hemisphere 3 to 6 Hz theta- delta slowing admixed with 12 to 13 Hz beta activity.  Hyperventilation and photic stimulation were not performed.   ABNORMALITY - Continuous slow, generalized and lateralized right hemisphere  IMPRESSION: This study is suggestive of cortical dysfunction in right hemisphere likely secondary to underlying encephalomalacia.  Additionally there is moderate to severe diffuse encephalopathy, likely related to sedation.  No seizures or epileptiform discharges were seen throughout the recording. Priyanka O Yadav   CT Head Wo Contrast Result Date: 07/28/2023 CLINICAL DATA:  Seizure EXAM: CT HEAD WITHOUT CONTRAST TECHNIQUE: Contiguous axial images were obtained from the base of the skull through the vertex without intravenous contrast. RADIATION DOSE REDUCTION: This exam was performed according to the departmental dose-optimization program which includes automated exposure control, adjustment of the mA and/or kV according to patient size and/or use of iterative reconstruction technique. COMPARISON:  MRI 07/01/2023, CT 07/01/2023 FINDINGS: Brain: No acute territorial infarction, hemorrhage or intracranial mass. Chronic right MCA infarct with encephalomalacia in the right frontal and parietal  lobes. Chronic small vessel ischemic changes of the white matter. Stable ventricle size. Vascular: No hyperdense vessels.  Carotid vascular calcification Skull: Normal. Negative for fracture or focal lesion. Sinuses/Orbits: No acute finding. Other: None IMPRESSION: 1. No CT evidence for acute intracranial abnormality. 2. Chronic right MCA infarct. Chronic small vessel ischemic changes of the white matter. Electronically Signed   By: Luke Bun M.D.   On: 07/28/2023 23:26   DG Chest Port 1 View Result Date: 07/28/2023 CLINICAL DATA:  Intubated EXAM: PORTABLE CHEST 1 VIEW COMPARISON:  07/01/2023 FINDINGS: Endotracheal tube tip is about 4.5 cm superior to the carina. Interval airspace disease at the bilateral bases. Normal cardiac size. Aortic atherosclerosis IMPRESSION: 1. Endotracheal tube tip about 4.5 cm superior to the carina. 2. Interval airspace disease at the bilateral bases, atelectasis versus pneumonia versus aspiration Electronically Signed   By: Luke Bun M.D.   On: 07/28/2023 23:22   DG Abd 1 View Result Date: 07/28/2023 CLINICAL DATA:  OG tube placement EXAM: ABDOMEN - 1 VIEW COMPARISON:  07/01/2023 FINDINGS: Esophageal tube tip projects at the level of left diaphragm trauma presumably due to mild elevation of left diaphragm and upward displacement of the stomach. IMPRESSION: Esophageal tube tip projects at the level of left diaphragm, presumably due to mild elevation of left diaphragm and upward displacement of the stomach. Electronically Signed   By: Luke Bun M.D.   On: 07/28/2023 23:20    Microbiology: Recent Results (from the past 240 hours)  Resp panel by RT-PCR (RSV, Flu A&B, Covid) Anterior Nasal Swab     Status: None   Collection Time: 07/28/23  7:40 PM   Specimen: Anterior Nasal Swab  Result Value Ref Range Status   SARS Coronavirus 2 by RT PCR NEGATIVE NEGATIVE Final    Comment: (NOTE) SARS-CoV-2 target nucleic acids are NOT DETECTED.  The SARS-CoV-2 RNA is generally  detectable in upper respiratory specimens during the acute phase of infection. The lowest concentration of SARS-CoV-2 viral copies this assay can detect is 138 copies/mL. A negative result does not preclude SARS-Cov-2 infection and should not be used as the sole basis for treatment or other patient management decisions. A negative result may occur with  improper specimen collection/handling, submission of specimen other than nasopharyngeal swab, presence of viral mutation(s) within the areas targeted by this assay, and inadequate number of viral copies(<138 copies/mL). A negative result must be combined with clinical observations, patient history, and epidemiological information. The expected result is Negative.  Fact Sheet for Patients:  bloggercourse.com  Fact Sheet for Healthcare Providers:  seriousbroker.it  This test is no t yet approved or cleared by the [place]  FDA and  has been authorized for detection and/or diagnosis of SARS-CoV-2 by FDA under an Emergency Use Authorization (EUA). This EUA will remain  in effect (meaning this test can be used) for the duration of the COVID-19 declaration under Section 564(b)(1) of the Act, 21 U.S.C.section 360bbb-3(b)(1), unless the authorization is terminated  or revoked sooner.       Influenza A by PCR NEGATIVE NEGATIVE Final   Influenza B by PCR NEGATIVE NEGATIVE Final    Comment: (NOTE) The Xpert Xpress SARS-CoV-2/FLU/RSV plus assay is intended as an aid in the diagnosis of influenza from Nasopharyngeal swab specimens and should not be used as a sole basis for treatment. Nasal washings and aspirates are unacceptable for Xpert Xpress SARS-CoV-2/FLU/RSV testing.  Fact Sheet for Patients: bloggercourse.com  Fact Sheet for Healthcare Providers: seriousbroker.it  This test is not yet approved or cleared by the [place]  FDA  and has been authorized for detection and/or diagnosis of SARS-CoV-2 by FDA under an Emergency Use Authorization (EUA). This EUA will remain in effect (meaning this test can be used) for the duration of the COVID-19 declaration under Section 564(b)(1) of the Act, 21 U.S.C. section 360bbb-3(b)(1), unless the authorization is terminated or revoked.     Resp Syncytial Virus by PCR NEGATIVE NEGATIVE Final    Comment: (NOTE) Fact Sheet for Patients: bloggercourse.com  Fact Sheet for Healthcare Providers: seriousbroker.it  This test is not yet approved or cleared by the [place]  FDA and has been authorized for detection and/or diagnosis of SARS-CoV-2 by FDA under an Emergency Use Authorization (EUA). This EUA will remain in effect (meaning this test can be used) for the duration of the COVID-19 declaration under Section 564(b)(1) of the Act, 21 U.S.C. section 360bbb-3(b)(1), unless the authorization is terminated or revoked.  Performed at [facility], [address], [place], [place] [address]   Culture, blood (Routine X 2) w Reflex to ID Panel     Status: None   Collection Time: 07/28/23  8:04 PM   Specimen: BLOOD  Result Value Ref Range Status   Specimen Description BLOOD LEFT ANTECUBITAL  Final   Special Requests   Final    BOTTLES DRAWN AEROBIC AND ANAEROBIC Blood Culture results may not be optimal due to an inadequate volume of blood received in culture bottles   Culture   Final    NO GROWTH 5 DAYS Performed at [facility], [address] [place], [place] [address]    Report Status 08/02/2023 FINAL  Final  Culture, blood (Routine X 2) w Reflex to ID Panel     Status: None   Collection Time: 07/28/23  8:04 PM   Specimen: BLOOD  Result Value Ref Range Status   Specimen Description BLOOD BLOOD LEFT FOREARM  Final   Special Requests   Final    BOTTLES DRAWN AEROBIC ONLY Blood Culture results may not be optimal due to an  inadequate volume of blood received in culture bottles   Culture   Final    NO GROWTH 5 DAYS Performed at [facility], [address], [place], [place] [address]    Report Status 08/02/2023 FINAL  Final  MRSA Next Gen by PCR, Nasal     Status: Abnormal   Collection Time: 07/29/23  4:03 AM   Specimen: Nasal Mucosa; Nasal Swab  Result Value Ref Range Status   MRSA by PCR Next Gen DETECTED (A) NOT DETECTED Final    Comment: RESULT CALLED TO, READ BACK BY AND VERIFIED WITH: K MOLEN,RN@0535  07/29/23 MK (  NOTE) The GeneXpert MRSA Assay (FDA approved for NASAL specimens only), is one component of a comprehensive MRSA colonization surveillance program. It is not intended to diagnose MRSA infection nor to guide or monitor treatment for MRSA infections. Test performance is not FDA approved in patients less than 71 years old. Performed at [facility], [address], [place], [place] [address]   Culture, Respiratory w Gram Stain     Status: None   Collection Time: 07/29/23  9:52 PM   Specimen: Tracheal Aspirate; Respiratory  Result Value Ref Range Status   Specimen Description TRACHEAL ASPIRATE  Final   Special Requests NONE  Final   Gram Stain   Final    MODERATE WBC PRESENT,BOTH PMN AND MONONUCLEAR FEW GRAM POSITIVE COCCI IN PAIRS Performed at [facility], [address], [place], [place] [address]    Culture   Final    ABUNDANT METHICILLIN RESISTANT STAPHYLOCOCCUS AUREUS   Report Status 08/01/2023 FINAL  Final   Organism ID, Bacteria METHICILLIN RESISTANT STAPHYLOCOCCUS AUREUS  Final      Susceptibility   Methicillin resistant staphylococcus aureus - MIC*    CIPROFLOXACIN  >=8 RESISTANT Resistant     ERYTHROMYCIN >=8 RESISTANT Resistant     GENTAMICIN <=0.5 SENSITIVE Sensitive     OXACILLIN >=4 RESISTANT Resistant     TETRACYCLINE <=1 SENSITIVE Sensitive     VANCOMYCIN  1 SENSITIVE Sensitive     TRIMETH/SULFA >=320 RESISTANT Resistant     CLINDAMYCIN <=0.25 SENSITIVE  Sensitive     RIFAMPIN <=0.5 SENSITIVE Sensitive     Inducible Clindamycin NEGATIVE Sensitive     LINEZOLID  2 SENSITIVE Sensitive     * ABUNDANT METHICILLIN RESISTANT STAPHYLOCOCCUS AUREUS     Labs: Basic Metabolic Panel: Recent Labs  Lab 07/28/23 2004 07/30/23 0028 07/31/23 0238 08/02/23 0545 08/03/23 0940  NA 142 144 141 143 144  K 4.3 3.9 3.4* 3.9 4.5  CL 110 110 107 111 110  CO2 14* 21* 22 20* 25  GLUCOSE 151* 139* 97 116* 116*  BUN 17 11 10 11 8   CREATININE 1.29* 0.94 0.88 0.96 0.78  CALCIUM  9.0 9.0 9.0 9.2 9.3  MG 1.7 1.8 1.7 1.5* 1.9  PHOS  --   --  2.7 4.0  --    Liver Function Tests: Recent Labs  Lab 07/28/23 2004 08/02/23 0545  AST 27  --   ALT 21  --   ALKPHOS 92  --   BILITOT 0.6  --   PROT 7.9  --   ALBUMIN 3.8 3.0*   No results for input(s): LIPASE, AMYLASE in the last 168 hours. No results for input(s): AMMONIA in the last 168 hours. CBC: Recent Labs  Lab 07/28/23 2004 07/30/23 0028 07/31/23 0238 08/02/23 0545 08/03/23 0940  WBC 21.2* 13.8* 12.3* 9.1 9.3  NEUTROABS 14.7*  --   --  4.2  --   HGB 12.7* 12.1* 11.0* 11.8* 12.6*  HCT 41.2 37.5* 34.1* 38.1* 38.4*  MCV 97.4 91.2 91.7 94.8 89.9  PLT 277 227 185 202 235   Cardiac Enzymes: No results for input(s): CKTOTAL, CKMB, CKMBINDEX, TROPONINI in the last 168 hours. BNP: BNP (last 3 results) Recent Labs    10/08/22 0434 10/09/22 0445 10/10/22 0423  BNP 446.0* 214.5* 183.6*    ProBNP (last 3 results) No results for input(s): PROBNP in the last 8760 hours.  CBG: Recent Labs  Lab 08/02/23 1117 08/02/23 1619 08/02/23 2108 08/03/23 0625 08/03/23 1138  GLUCAP 145* 159* 145*  136* 103*       Signed:  Toribio Hummer MD.  [facility] 08/03/2023, 3:07 PM

## 2023-08-03 NOTE — Plan of Care (Signed)
  Problem: Clinical Measurements: Goal: Ability to maintain clinical measurements within normal limits will improve Outcome: Progressing   Problem: Activity: Goal: Risk for activity intolerance will decrease Outcome: Progressing   Problem: Nutrition: Goal: Adequate nutrition will be maintained Outcome: Progressing   Problem: Elimination: Goal: Will not experience complications related to bowel motility Outcome: Progressing   Problem: Safety: Goal: Ability to remain free from injury will improve Outcome: Progressing   Problem: Skin Integrity: Goal: Risk for impaired skin integrity will decrease Outcome: Progressing   Problem: Pain Management: Goal: General experience of comfort will improve Outcome: Progressing

## 2023-08-03 NOTE — Progress Notes (Signed)
 Physical Therapy Treatment Patient Details Name: Jacob Mcgrath. MRN: [id number] DOB: 03-05-1956 Today's Date: 08/03/2023   History of Present Illness Pt is 68 year old presented to [facility] on  07/28/23 for seizure. Pt intubated on arrival. Transferred to [facility] on 1/3. Extubated 1/4. PMH - dementia, DM 2, neuropathy, HTN, HDL, GERD, depression, anxiety, PVD,CVA with chronic lt hand contracture.    PT Comments  Pt resting in bed on arrival and agreeable to session with good progress towards acute goals. Pt awake and alert throughout session, oriented to self and able to maintain simple conversation and express needs/likes. Pt able to follow simple single step commands throughout session and physically requiring min A for bed mobility and up to mod A for transfers to stand with single UE support on back of chair. Pt able to progress OOB mobility taking side steps toward [facility] with HHA on R and mod A to maintain balance. Current plan remains appropriate to address deficits and maximize functional independence and decrease caregiver burden. Pt continues to benefit from skilled PT services to progress toward functional mobility goals.      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 Restrictions Weight Bearing Restrictions Per Provider Order: No     Mobility  Bed Mobility Overal bed mobility: Needs Assistance Bed Mobility: Supine to Sit, Sit to Supine     Supine to sit: HOB elevated, Min assist Sit to supine: Min assist   General bed mobility comments: light min A to elevate  trunk and scoot out to EOB, min A to reposition midline on return to supine    Transfers Overall transfer level: Needs assistance Equipment used: 1 person hand held assist (chair back) Transfers: Sit to/from Stand Sit to Stand: Mod assist           General transfer comment: mod A to boost up with R hand on back of standard arm chair, able to take side steps to [facility] x5 with HHA on R, moments on standing with R hand on chair at [facility] with pt blocking backs of legs at EOB    Ambulation/Gait                   Stairs             Wheelchair Mobility     Tilt Bed    Modified Rankin (Stroke Patients Only)       Balance Overall balance assessment: Needs assistance Sitting-balance support: Single extremity supported, Feet supported Sitting balance-Leahy Scale: Fair Sitting balance - Comments: able to maintain at CGA, light cues for anterior weight shift Postural control: Posterior lean Standing balance support: Single extremity supported Standing balance-Leahy Scale: Poor Standing balance comment: at least single UE support to maintain balance                            Cognition Arousal: Alert Behavior During Therapy: WFL for tasks assessed/performed Overall Cognitive Status: History of cognitive impairments - at baseline  General Comments: oriented to self only. Likes to joke.        Exercises Other Exercises Other Exercises: warm up LE exercises LAQ, seated marching x20    General Comments General comments (skin integrity, edema, etc.): VSS      Pertinent Vitals/Pain Pain Assessment Pain Assessment: Faces Faces Pain Scale: No hurt Pain Intervention(s): Monitored during session    Home Living                          Prior Function            PT Goals (current goals can now be found in the care plan section) Acute Rehab PT Goals Patient Stated Goal: not stated PT Goal  Formulation: Patient unable to participate in goal setting Time For Goal Achievement: 08/14/23 Progress towards PT goals: Progressing toward goals    Frequency    Min 1X/week      PT Plan      Co-evaluation              AM-PAC PT 6 Clicks Mobility   Outcome Measure  Help needed turning from your back to your side while in a flat bed without using bedrails?: A Lot Help needed moving from lying on your back to sitting on the side of a flat bed without using bedrails?: A Lot Help needed moving to and from a bed to a chair (including a wheelchair)?: A Lot Help needed standing up from a chair using your arms (e.g., wheelchair or bedside chair)?: A Lot Help needed to walk in hospital room?: Total Help needed climbing 3-5 steps with a railing? : Total 6 Click Score: 10    End of Session Equipment Utilized During Treatment: Gait belt Activity Tolerance: Patient tolerated treatment well Patient left: in bed;with call bell/phone within reach;with bed alarm set Nurse Communication: Mobility status PT Visit Diagnosis: Other abnormalities of gait and mobility (R26.89);Hemiplegia and hemiparesis Hemiplegia - Right/Left: Left Hemiplegia - dominant/non-dominant: Non-dominant Hemiplegia - caused by: Cerebral infarction     Time: 8491-8473 PT Time Calculation (min) (ACUTE ONLY): 18 min  Charges:    $Gait Training: 8-22 mins PT General Charges $$ ACUTE PT VISIT: 1 Visit                     Manford Sprong R. PTA Acute Rehabilitation Services Office: [phone]   Therisa CHRISTELLA Boor 08/03/2023, 4:18 PM

## 2023-08-03 NOTE — TOC Progression Note (Signed)
 Transition of Care (TOC) - Progression Note    Patient Details  Name: Jacob Mcgrath. MRN: [id number] Date of Birth: [birth year]  Transition of Care [facility]) CM/SW Contact  Almarie CHRISTELLA [place]ie, [place] Phone Number: 08/03/2023, 3:16 PM  Clinical Narrative:   CSW updated by MD that patient is stable to return to LTC today. CSW contacted [facility], patient did not do a bed hold so will need to determine if they have a bed for patient, will call CSW back. CSW sent discharge summary and received call back from [facility] that they do not have a room available for the patient today, but will have one tomorrow. CSW updated MD with barrier to discharge. CSW to follow.    Expected Discharge Plan: Skilled Nursing Facility Barriers to Discharge: No SNF bed  Expected Discharge Plan and Services In-house Referral: Clinical Social Work   Post Acute Care Choice: Skilled Nursing Facility Living arrangements for the past 2 months: Skilled Nursing Facility Expected Discharge Date: 08/03/23                                     Social Determinants of Health (SDOH) Interventions SDOH Screenings   Food Insecurity: No Food Insecurity (08/01/2023)  Housing: Unknown (08/01/2023)  Transportation Needs: Patient Unable To Answer (07/30/2023)  Utilities: Patient Unable To Answer (07/30/2023)  Social Connections: Patient Unable To Answer (07/30/2023)  Tobacco Use: High Risk (07/28/2023)    Readmission Risk Interventions    08/01/2023    3:31 PM 03/01/2023    2:40 PM  Readmission Risk Prevention Plan  Transportation Screening Complete Complete  PCP or Specialist Appt within 5-7 Days Complete Not Complete  Home Care Screening Complete Complete  Medication Review (RN CM) Complete Complete

## 2023-08-04 LAB — GLUCOSE, CAPILLARY
Glucose-Capillary: 136 mg/dL — ABNORMAL HIGH (ref 70–99)
Glucose-Capillary: 148 mg/dL — ABNORMAL HIGH (ref 70–99)

## 2023-08-04 MED ORDER — LINEZOLID 600 MG PO TABS: 600.0000 mg | ORAL_TABLET | Freq: Two times a day (BID) | ORAL | 0 refills | Status: AC

## 2023-08-04 NOTE — Progress Notes (Signed)
 Patient was to be discharged yesterday however facility was unable to accommodate him.  Patient currently stable.  Vital signs stable.  Patient medically stable for discharge today back to facility.  No charge.

## 2023-08-04 NOTE — Discharge Summary (Signed)
 Physician Discharge Summary  Jacob Mcgrath. FMW:[id number] DOB: Jan 13, 1956 DOA: 07/28/2023  PCP: [name], MD  Admit date: 07/28/2023 Discharge date: 08/04/2023  Time spent: 60 minutes  Recommendations for Outpatient Follow-up:  Follow-up with MD at skilled nursing facility.  Patient will need a basic metabolic profile, magnesium  level checked in 1 week to follow-up on electrolytes and renal function. Follow-up with [facility] for follow-up on seizures/status epilepticus.   Discharge Diagnoses:  Principal Problem:   Status epilepticus (HCC) Active Problems:   Controlled type 2 diabetes mellitus without complication, without long-term current use of insulin  (HCC)   Hypomagnesemia   Community acquired pneumonia of right lower lobe of lung   Dementia without behavioral disturbance (HCC)   Acute metabolic encephalopathy   Essential hypertension   Discharge Condition: Stable and improved.  Diet recommendation: Regular  Filed Weights   08/02/23 0455 08/03/23 0456 08/04/23 0500  Weight: 60.7 kg 59.7 kg 60 kg    History of present illness:  HPI per Dr. Kara Patient is a 68 yo M w/ pertinent PMH recent seizure on keppra , CVA w/ left sided weakness on asa/plavix , htn, t2dm, dementia presents to [facility] ED on 1/2 w/ seizure.   Patient recently hospitalized on 12/6-12/10 w/ new onset seizures. Started on keppra . Also w/ UTI and pna. Discharged to [facility].    On 1/2 patient having muscle spasms and brought in by EMS to [facility] ED. In ED had witnessed generalized tonic-clonic seizure activity and became unresponsive. Eyes deviated to left. Given ativan  and keppra . Intubated for airway protection. Started on propofol  for seizure control. Patient afebrile and bp stable. CT head no acute abnormality. CXR w/ small b/l infiltrate at bases; possible aspiration. Given vanc/rocephin . Neurology consulted. Recommended transfer to [facility] for cEEG. PCCM consulted  for icu admission.  Hospital Course:  #1 status epilepticus/history of recent seizure disorder in the context of prior CVA and dementia -Patient admitted with concerns for status epilepticus was intubated for airway protection on the critical care service. -Patient seen in consultation by neurology and placed on LTM EEG. -Urinalysis ordered with trace leukocytes, nitrite negative, 0-5 WBCs. -Chest x-ray concerning for pneumonia. -Neurology recommended increasing home regimen of Keppra  to 750 twice daily which would be his max due to his creatinine clearance. -Patient improved on increased dose of Keppra , no further seizures subsequently extubated. -Patient remained in stable condition without any further seizure activity.  -Outpatient follow-up with neurology in 4 weeks.     2.  Acute metabolic encephalopathy -Secondary to postictal state. -Improved and had resolved by day of discharge.   3.  Concern for aspiration pneumonia versus CAP -Noted on chest x-ray. -Patient intubated and subsequently extubated. -O2 sats remained at 100% on room air.  -Tracheal aspirate with MRSA. -Patient initially placed on IV Rocephin  and completed a 5-day course of IV Rocephin  prior to finalization of tracheal aspirate cultures. -Once tracheal aspirate was noted to be positive for MRSA patient started on IV vancomycin  and subsequently transitioned to oral Zyvox  and will be discharged on 7 more days of Zyvox  to complete a 10-day course of treatment.  -Outpatient follow-up with MD at facility.   4.  Hypomagnesemia -Repleted during the hospitalization.   -Outpatient follow-up.   5.  Dementia -Stable.   6.  Hypertension -Controlled on home regimen Lopressor .   7.  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 -Patient maintained on aspirin  and Plavix  for secondary stroke prophylaxis. -Patient also maintained on  home regimen statin.   8.  Well-controlled diabetes mellitus type  2 -Hemoglobin A1c 5.4 07/01/2023 -Patient initially placed on sliding scale insulin  while in the ICU for tight glycemic control, blood glucose levels may remain well-controlled and sliding scale insulin  discontinued.   -Outpatient follow-up.        Procedures: CT head 07/28/2023 Chest x-ray 07/28/2023, 07/29/2023 Abdominal films 07/28/2023 EEG   Significant Hospital Events: Including procedures, antibiotic start and stop dates in addition to other pertinent events   1/2 status epilepticus; intubated 1/3 transferred to [facility] from [facility]; pccm to admit 1/4 no further seizures off sedation.  Extubated.  Consultations: PCCM admission Neurology: Dr.Bhagat 07/29/2023  Discharge Exam: Vitals:   08/04/23 0359 08/04/23 0824  BP: 130/65 (!) 121/58  Pulse: 85 71  Resp: 17 17  Temp: 98.1 F (36.7 C) 97.9 F (36.6 C)  SpO2: 98% 100%    General: NAD Cardiovascular: RRR no murmurs rubs or gallops.  No JVD.  No lower extremity edema. Respiratory: Clear to auscultation bilaterally.  No wheezes, no crackles, no rhonchi.  Fair air movement.  Speaking in full sentences.  Discharge Instructions   Discharge Instructions     Ambulatory referral to Neurology   Complete by: As directed    An appointment is requested in approximately: 4 weeks   Diet - low sodium heart healthy   Complete by: As directed    Diet general   Complete by: As directed    Increase activity slowly   Complete by: As directed    Increase activity slowly   Complete by: As directed       Allergies as of 08/04/2023       Reactions   Amoxicillin Other (See Comments)   Unknown reaction per facility   Ampicillin Other (See Comments)   Unknown reaction per facility   Tetracyclines & Related Rash        Medication List     TAKE these medications    aspirin  EC 81 MG tablet Take 81 mg by mouth daily. Swallow whole.   atorvastatin  80 MG tablet Commonly known as: LIPITOR  Take 80 mg by mouth at bedtime.   Baclofen  5 MG  Tabs Take 1 tablet by mouth in the morning, at noon, and at bedtime.   clopidogrel  75 MG tablet Commonly known as: PLAVIX  Take 1 tablet (75 mg total) by mouth daily with breakfast. What changed: when to take this   DULoxetine  30 MG capsule Commonly known as: CYMBALTA  Take 30 mg by mouth daily.   feeding supplement Liqd Take 237 mLs by mouth 2 (two) times daily between meals.   guaiFENesin 600 MG 12 hr tablet Commonly known as: MUCINEX Take 600 mg by mouth 2 (two) times daily as needed for cough.   levETIRAcetam  750 MG tablet Commonly known as: KEPPRA  Take 1 tablet (750 mg total) by mouth 2 (two) times daily. What changed:  medication strength how much to take   linezolid  600 MG tablet Commonly known as: ZYVOX  Take 1 tablet (600 mg total) by mouth every 12 (twelve) hours for 7 days.   melatonin 3 MG Tabs tablet Take 2 tablets (6 mg total) by mouth at bedtime.   metoprolol  tartrate 25 MG tablet Commonly known as: LOPRESSOR  Take 1 tablet (25 mg total) by mouth 2 (two) times daily.   multivitamin tablet Take 1 tablet by mouth daily.   mupirocin  ointment 2 % Commonly known as: BACTROBAN  Place 1 Application into the nose 2 (two) times daily for  5 days.   nicotine  21 mg/24hr patch Commonly known as: NICODERM CQ  - dosed in mg/24 hours Place 1 patch (21 mg total) onto the skin daily.   pantoprazole  40 MG tablet Commonly known as: PROTONIX  Take 1 tablet (40 mg total) by mouth daily.   PARoxetine  40 MG tablet Commonly known as: PAXIL  Take 40 mg by mouth daily.   tamsulosin  0.4 MG Caps capsule Commonly known as: FLOMAX  Take 1 capsule (0.4 mg total) by mouth daily.   TYLENOL  PO Take 1 tablet by mouth every 6 (six) hours as needed (mild pain).       Allergies  Allergen Reactions   Amoxicillin Other (See Comments)    Unknown reaction per facility   Ampicillin Other (See Comments)    Unknown reaction per facility   Tetracyclines & Related Rash    Contact  information for follow-up providers     MD AT SNF Follow up.          [facility]. Schedule an appointment as soon as possible for a visit in 4 week(s).   Specialty: Neurology Contact information: [address] [place]  [address] [phone]             Contact information for after-discharge care     Destination     [facility] .   Service: Skilled Nursing Contact information: [address] [place]  [address] [phone]                      The results of significant diagnostics from this hospitalization (including imaging, microbiology, ancillary and laboratory) are listed below for reference.    Significant Diagnostic Studies: DG Chest Port 1 View Result Date: 07/29/2023 CLINICAL DATA:  Intubated patient. EXAM: PORTABLE CHEST 1 VIEW COMPARISON:  Radiographs 07/28/2023 and 07/01/2023.  CT 10/21/2021. FINDINGS: 0456 hours. Two views submitted. Endotracheal tube is unchanged at the level of the mid trachea. Enteric tube projects below the diaphragm, tip over the proximal stomach. The heart size and mediastinal contours are stable with aortic atherosclerosis. Persistent left greater than right basilar airspace opacities, similar to yesterday's examination. Possible small left pleural effusion. No evidence of pneumothorax. Old fracture of the proximal left humerus noted. IMPRESSION: 1. Persistent left greater than right basilar airspace opacities, similar to yesterday's examination, suspicious for possible pneumonia or aspiration. Correlate clinically. Possible small left pleural effusion. 2. Stable support system. Electronically Signed   By: Elsie Perone M.D.   On: 07/29/2023 09:58   Overnight EEG with video Result Date: 07/29/2023 Shelton Arlin KIDD, MD     07/30/2023  7:39 AM Patient Name: Jacob Mcgrath. MRN: [id number] Epilepsy Attending:  Arlin KIDD Shelton Referring Physician/Provider: Jerrie Lola CROME, MD  Duration: 07/29/2023 9470 to 07/30/2023 9470 Patient history:  68 y.o. male with breakthrough seizure with no clear triggering etiology. EEG to evaluate for seizure. Level of alertness:  comatose AEDs during EEG study: Propofol , LEV Technical aspects: This EEG study was done with scalp electrodes positioned according to the 10-20 International system of electrode placement. Electrical activity was reviewed with band pass filter of 1-70Hz , sensitivity of 7 uV/mm, display speed of 73mm/sec with a 60Hz  notched filter applied as appropriate. EEG data were recorded continuously and digitally stored.  Video monitoring was available and reviewed as appropriate. Description: EEG showed continuous generalized and lateralized right hemisphere 3 to 6 Hz theta- delta slowing admixed with 12 to 13 Hz  beta activity.  Hyperventilation and photic stimulation were not performed.   ABNORMALITY - Continuous slow, generalized and lateralized right hemisphere IMPRESSION: This study is suggestive of cortical dysfunction in right hemisphere likely secondary to underlying encephalomalacia.  Additionally there is moderate to severe diffuse encephalopathy, likely related to sedation.  No seizures or epileptiform discharges were seen throughout the recording. Priyanka O Yadav   CT Head Wo Contrast Result Date: 07/28/2023 CLINICAL DATA:  Seizure EXAM: CT HEAD WITHOUT CONTRAST TECHNIQUE: Contiguous axial images were obtained from the base of the skull through the vertex without intravenous contrast. RADIATION DOSE REDUCTION: This exam was performed according to the departmental dose-optimization program which includes automated exposure control, adjustment of the mA and/or kV according to patient size and/or use of iterative reconstruction technique. COMPARISON:  MRI 07/01/2023, CT 07/01/2023 FINDINGS: Brain: No acute territorial infarction, hemorrhage or intracranial mass.  Chronic right MCA infarct with encephalomalacia in the right frontal and parietal lobes. Chronic small vessel ischemic changes of the white matter. Stable ventricle size. Vascular: No hyperdense vessels.  Carotid vascular calcification Skull: Normal. Negative for fracture or focal lesion. Sinuses/Orbits: No acute finding. Other: None IMPRESSION: 1. No CT evidence for acute intracranial abnormality. 2. Chronic right MCA infarct. Chronic small vessel ischemic changes of the white matter. Electronically Signed   By: Luke Bun M.D.   On: 07/28/2023 23:26   DG Chest Port 1 View Result Date: 07/28/2023 CLINICAL DATA:  Intubated EXAM: PORTABLE CHEST 1 VIEW COMPARISON:  07/01/2023 FINDINGS: Endotracheal tube tip is about 4.5 cm superior to the carina. Interval airspace disease at the bilateral bases. Normal cardiac size. Aortic atherosclerosis IMPRESSION: 1. Endotracheal tube tip about 4.5 cm superior to the carina. 2. Interval airspace disease at the bilateral bases, atelectasis versus pneumonia versus aspiration Electronically Signed   By: Luke Bun M.D.   On: 07/28/2023 23:22   DG Abd 1 View Result Date: 07/28/2023 CLINICAL DATA:  OG tube placement EXAM: ABDOMEN - 1 VIEW COMPARISON:  07/01/2023 FINDINGS: Esophageal tube tip projects at the level of left diaphragm trauma presumably due to mild elevation of left diaphragm and upward displacement of the stomach. IMPRESSION: Esophageal tube tip projects at the level of left diaphragm, presumably due to mild elevation of left diaphragm and upward displacement of the stomach. Electronically Signed   By: Luke Bun M.D.   On: 07/28/2023 23:20    Microbiology: Recent Results (from the past 240 hours)  Resp panel by RT-PCR (RSV, Flu A&B, Covid) Anterior Nasal Swab     Status: None   Collection Time: 07/28/23  7:40 PM   Specimen: Anterior Nasal Swab  Result Value Ref Range Status   SARS Coronavirus 2 by RT PCR NEGATIVE NEGATIVE Final    Comment:  (NOTE) SARS-CoV-2 target nucleic acids are NOT DETECTED.  The SARS-CoV-2 RNA is generally detectable in upper respiratory specimens during the acute phase of infection. The lowest concentration of SARS-CoV-2 viral copies this assay can detect is 138 copies/mL. A negative result does not preclude SARS-Cov-2 infection and should not be used as the sole basis for treatment or other patient management decisions. A negative result may occur with  improper specimen collection/handling, submission of specimen other than nasopharyngeal swab, presence of viral mutation(s) within the areas targeted by this assay, and inadequate number of viral copies(<138 copies/mL). A negative result must be combined with clinical observations, patient history, and epidemiological information. The expected result is Negative.  Fact Sheet for Patients:  bloggercourse.com  Fact Sheet  for Healthcare Providers:  seriousbroker.it  This test is no t yet approved or cleared by the [place]  FDA and  has been authorized for detection and/or diagnosis of SARS-CoV-2 by FDA under an Emergency Use Authorization (EUA). This EUA will remain  in effect (meaning this test can be used) for the duration of the COVID-19 declaration under Section 564(b)(1) of the Act, 21 U.S.C.section 360bbb-3(b)(1), unless the authorization is terminated  or revoked sooner.       Influenza A by PCR NEGATIVE NEGATIVE Final   Influenza B by PCR NEGATIVE NEGATIVE Final    Comment: (NOTE) The Xpert Xpress SARS-CoV-2/FLU/RSV plus assay is intended as an aid in the diagnosis of influenza from Nasopharyngeal swab specimens and should not be used as a sole basis for treatment. Nasal washings and aspirates are unacceptable for Xpert Xpress SARS-CoV-2/FLU/RSV testing.  Fact Sheet for Patients: bloggercourse.com  Fact Sheet for Healthcare  Providers: seriousbroker.it  This test is not yet approved or cleared by the [place]  FDA and has been authorized for detection and/or diagnosis of SARS-CoV-2 by FDA under an Emergency Use Authorization (EUA). This EUA will remain in effect (meaning this test can be used) for the duration of the COVID-19 declaration under Section 564(b)(1) of the Act, 21 U.S.C. section 360bbb-3(b)(1), unless the authorization is terminated or revoked.     Resp Syncytial Virus by PCR NEGATIVE NEGATIVE Final    Comment: (NOTE) Fact Sheet for Patients: bloggercourse.com  Fact Sheet for Healthcare Providers: seriousbroker.it  This test is not yet approved or cleared by the [place]  FDA and has been authorized for detection and/or diagnosis of SARS-CoV-2 by FDA under an Emergency Use Authorization (EUA). This EUA will remain in effect (meaning this test can be used) for the duration of the COVID-19 declaration under Section 564(b)(1) of the Act, 21 U.S.C. section 360bbb-3(b)(1), unless the authorization is terminated or revoked.  Performed at [facility], [address], [place], [place] [address]   Culture, blood (Routine X 2) w Reflex to ID Panel     Status: None   Collection Time: 07/28/23  8:04 PM   Specimen: BLOOD  Result Value Ref Range Status   Specimen Description BLOOD LEFT ANTECUBITAL  Final   Special Requests   Final    BOTTLES DRAWN AEROBIC AND ANAEROBIC Blood Culture results may not be optimal due to an inadequate volume of blood received in culture bottles   Culture   Final    NO GROWTH 5 DAYS Performed at [facility], [address] [place], [place] [address]    Report Status 08/02/2023 FINAL  Final  Culture, blood (Routine X 2) w Reflex to ID Panel     Status: None   Collection Time: 07/28/23  8:04 PM   Specimen: BLOOD  Result Value Ref Range Status   Specimen Description BLOOD BLOOD LEFT  FOREARM  Final   Special Requests   Final    BOTTLES DRAWN AEROBIC ONLY Blood Culture results may not be optimal due to an inadequate volume of blood received in culture bottles   Culture   Final    NO GROWTH 5 DAYS Performed at [facility], [address] [place], [place] [address]    Report Status 08/02/2023 FINAL  Final  MRSA Next Gen by PCR, Nasal     Status: Abnormal   Collection Time: 07/29/23  4:03 AM   Specimen: Nasal Mucosa; Nasal Swab  Result Value Ref Range Status   MRSA by PCR Next Gen  DETECTED (A) NOT DETECTED Final    Comment: RESULT CALLED TO, READ BACK BY AND VERIFIED WITH: K MOLEN,RN@0535  07/29/23 MK (NOTE) The GeneXpert MRSA Assay (FDA approved for NASAL specimens only), is one component of a comprehensive MRSA colonization surveillance program. It is not intended to diagnose MRSA infection nor to guide or monitor treatment for MRSA infections. Test performance is not FDA approved in patients less than 23 years old. Performed at [facility], [address] [place], [place] [address]   Culture, Respiratory w Gram Stain     Status: None   Collection Time: 07/29/23  9:52 PM   Specimen: Tracheal Aspirate; Respiratory  Result Value Ref Range Status   Specimen Description TRACHEAL ASPIRATE  Final   Special Requests NONE  Final   Gram Stain   Final    MODERATE WBC PRESENT,BOTH PMN AND MONONUCLEAR FEW GRAM POSITIVE COCCI IN PAIRS Performed at [facility], [address] [place], [place] [address]    Culture   Final    ABUNDANT METHICILLIN RESISTANT STAPHYLOCOCCUS AUREUS   Report Status 08/01/2023 FINAL  Final   Organism ID, Bacteria METHICILLIN RESISTANT STAPHYLOCOCCUS AUREUS  Final      Susceptibility   Methicillin resistant staphylococcus aureus - MIC*    CIPROFLOXACIN  >=8 RESISTANT Resistant     ERYTHROMYCIN >=8 RESISTANT Resistant     GENTAMICIN <=0.5 SENSITIVE Sensitive     OXACILLIN >=4 RESISTANT Resistant     TETRACYCLINE <=1 SENSITIVE  Sensitive     VANCOMYCIN  1 SENSITIVE Sensitive     TRIMETH/SULFA >=320 RESISTANT Resistant     CLINDAMYCIN <=0.25 SENSITIVE Sensitive     RIFAMPIN <=0.5 SENSITIVE Sensitive     Inducible Clindamycin NEGATIVE Sensitive     LINEZOLID  2 SENSITIVE Sensitive     * ABUNDANT METHICILLIN RESISTANT STAPHYLOCOCCUS AUREUS     Labs: Basic Metabolic Panel: Recent Labs  Lab 07/28/23 2004 07/30/23 0028 07/31/23 0238 08/02/23 0545 08/03/23 0940  NA 142 144 141 143 144  K 4.3 3.9 3.4* 3.9 4.5  CL 110 110 107 111 110  CO2 14* 21* 22 20* 25  GLUCOSE 151* 139* 97 116* 116*  BUN 17 11 10 11 8   CREATININE 1.29* 0.94 0.88 0.96 0.78  CALCIUM  9.0 9.0 9.0 9.2 9.3  MG 1.7 1.8 1.7 1.5* 1.9  PHOS  --   --  2.7 4.0  --    Liver Function Tests: Recent Labs  Lab 07/28/23 2004 08/02/23 0545  AST 27  --   ALT 21  --   ALKPHOS 92  --   BILITOT 0.6  --   PROT 7.9  --   ALBUMIN 3.8 3.0*   No results for input(s): LIPASE, AMYLASE in the last 168 hours. No results for input(s): AMMONIA in the last 168 hours. CBC: Recent Labs  Lab 07/28/23 2004 07/30/23 0028 07/31/23 0238 08/02/23 0545 08/03/23 0940  WBC 21.2* 13.8* 12.3* 9.1 9.3  NEUTROABS 14.7*  --   --  4.2  --   HGB 12.7* 12.1* 11.0* 11.8* 12.6*  HCT 41.2 37.5* 34.1* 38.1* 38.4*  MCV 97.4 91.2 91.7 94.8 89.9  PLT 277 227 185 202 235   Cardiac Enzymes: No results for input(s): CKTOTAL, CKMB, CKMBINDEX, TROPONINI in the last 168 hours. BNP: BNP (last 3 results) Recent Labs    10/08/22 0434 10/09/22 0445 10/10/22 0423  BNP 446.0* 214.5* 183.6*    ProBNP (last 3 results) No results for input(s): PROBNP in the last 8760  hours.  CBG: Recent Labs  Lab 08/03/23 0625 08/03/23 1138 08/03/23 1602 08/03/23 2112 08/04/23 0613  GLUCAP 136* 103* 107* 119* 136*       Signed:  Toribio Hummer MD.  [facility] 08/04/2023, 11:00 AM

## 2023-08-04 NOTE — Plan of Care (Signed)
  Problem: Activity: Goal: Risk for activity intolerance will decrease Outcome: Progressing   Problem: Nutrition: Goal: Adequate nutrition will be maintained Outcome: Progressing   Problem: Elimination: Goal: Will not experience complications related to bowel motility Outcome: Progressing   Problem: Pain Management: Goal: General experience of comfort will improve Outcome: Progressing   Problem: Safety: Goal: Ability to remain free from injury will improve Outcome: Progressing   Problem: Skin Integrity: Goal: Risk for impaired skin integrity will decrease Outcome: Progressing   Problem: Health Behavior/Discharge Planning: Goal: Ability to identify and utilize available resources and services will improve Outcome: Progressing

## 2023-08-04 NOTE — TOC Transition Note (Signed)
 Transition of Care [facility]) - Discharge Note   Patient Details  Name: Jacob Mcgrath. MRN: [id number] Date of Birth: 08-23-1955  Transition of Care [facility]) CM/SW Contact:  Almarie CHRISTELLA Goodie, LCSW Phone Number: 08/04/2023, 1:44 PM   Clinical Narrative:   CSW updated by [facility] that bed is available for patient today. CSW updated MD, sent new discharge summary. CSW spoke with guardian, Ileana, to answer questions and send copy of discharge. Transport arranged with [facility] for next available.  Nurse to call report to [phone], Room B22-1.    Final next level of care: Skilled Nursing Facility Barriers to Discharge: Barriers Resolved   Patient Goals and CMS Choice Patient states their goals for this hospitalization and ongoing recovery are:: Return to [facility]          Discharge Placement              Patient [facility] bed at:  [facility]) Patient to be transferred to facility by: [facility] Name of family member notified: Kayla Patient and family notified of of transfer: 08/04/23  Discharge Plan and Services Additional resources added to the After Visit Summary for   In-house Referral: Clinical Social Work   Post Acute Care Choice: Skilled Nursing Facility                               Social Drivers of Health (SDOH) Interventions SDOH Screenings   Food Insecurity: No Food Insecurity (08/01/2023)  Housing: Unknown (08/01/2023)  Transportation Needs: Patient Unable To Answer (07/30/2023)  Utilities: Patient Unable To Answer (07/30/2023)  Social Connections: Patient Unable To Answer (07/30/2023)  Tobacco Use: High Risk (07/28/2023)     Readmission Risk Interventions    08/01/2023    3:31 PM 03/01/2023    2:40 PM  Readmission Risk Prevention Plan  Transportation Screening Complete Complete  PCP or Specialist Appt within 5-7 Days Complete Not Complete  Home Care Screening Complete Complete  Medication Review (RN CM) Complete Complete

## 2023-12-08 ENCOUNTER — Emergency Department (HOSPITAL_COMMUNITY)

## 2023-12-08 ENCOUNTER — Other Ambulatory Visit: Payer: Self-pay

## 2023-12-08 ENCOUNTER — Encounter (HOSPITAL_COMMUNITY): Payer: Self-pay | Admitting: Emergency Medicine

## 2023-12-08 ENCOUNTER — Inpatient Hospital Stay (HOSPITAL_COMMUNITY)
Admission: EM | Admit: 2023-12-08 | Discharge: 2023-12-16 | DRG: 056 | Disposition: A | Discharge status: Skilled Nursing Facility | Source: Skilled Nursing Facility | Attending: Internal Medicine | Admitting: Internal Medicine

## 2023-12-08 DIAGNOSIS — R569 Unspecified convulsions: Secondary | ICD-10-CM | POA: Diagnosis not present

## 2023-12-08 DIAGNOSIS — I69398 Other sequelae of cerebral infarction: Secondary | ICD-10-CM | POA: Diagnosis not present

## 2023-12-08 DIAGNOSIS — E16A1 Hypoglycemia level 1: Secondary | ICD-10-CM | POA: Diagnosis present

## 2023-12-08 DIAGNOSIS — R1312 Dysphagia, oropharyngeal phase: Secondary | ICD-10-CM | POA: Diagnosis present

## 2023-12-08 DIAGNOSIS — J9690 Respiratory failure, unspecified, unspecified whether with hypoxia or hypercapnia: Secondary | ICD-10-CM | POA: Diagnosis not present

## 2023-12-08 DIAGNOSIS — Z79899 Other long term (current) drug therapy: Secondary | ICD-10-CM

## 2023-12-08 DIAGNOSIS — J9601 Acute respiratory failure with hypoxia: Secondary | ICD-10-CM | POA: Diagnosis present

## 2023-12-08 DIAGNOSIS — E440 Moderate protein-calorie malnutrition: Secondary | ICD-10-CM | POA: Diagnosis present

## 2023-12-08 DIAGNOSIS — Z7982 Long term (current) use of aspirin: Secondary | ICD-10-CM

## 2023-12-08 DIAGNOSIS — G40911 Epilepsy, unspecified, intractable, with status epilepticus: Secondary | ICD-10-CM | POA: Diagnosis present

## 2023-12-08 DIAGNOSIS — I69354 Hemiplegia and hemiparesis following cerebral infarction affecting left non-dominant side: Secondary | ICD-10-CM

## 2023-12-08 DIAGNOSIS — G9340 Encephalopathy, unspecified: Secondary | ICD-10-CM | POA: Diagnosis not present

## 2023-12-08 DIAGNOSIS — E872 Acidosis, unspecified: Secondary | ICD-10-CM | POA: Diagnosis present

## 2023-12-08 DIAGNOSIS — D72829 Elevated white blood cell count, unspecified: Secondary | ICD-10-CM | POA: Diagnosis not present

## 2023-12-08 DIAGNOSIS — Z681 Body mass index (BMI) 19 or less, adult: Secondary | ICD-10-CM | POA: Diagnosis not present

## 2023-12-08 DIAGNOSIS — Z7902 Long term (current) use of antithrombotics/antiplatelets: Secondary | ICD-10-CM | POA: Diagnosis not present

## 2023-12-08 DIAGNOSIS — Z880 Allergy status to penicillin: Secondary | ICD-10-CM

## 2023-12-08 DIAGNOSIS — I10 Essential (primary) hypertension: Secondary | ICD-10-CM | POA: Diagnosis present

## 2023-12-08 DIAGNOSIS — R000 Tachycardia, unspecified: Secondary | ICD-10-CM | POA: Diagnosis not present

## 2023-12-08 DIAGNOSIS — E162 Hypoglycemia, unspecified: Secondary | ICD-10-CM | POA: Diagnosis not present

## 2023-12-08 DIAGNOSIS — Z8673 Personal history of transient ischemic attack (TIA), and cerebral infarction without residual deficits: Secondary | ICD-10-CM | POA: Diagnosis not present

## 2023-12-08 DIAGNOSIS — G40909 Epilepsy, unspecified, not intractable, without status epilepticus: Secondary | ICD-10-CM | POA: Diagnosis not present

## 2023-12-08 DIAGNOSIS — R1310 Dysphagia, unspecified: Secondary | ICD-10-CM | POA: Diagnosis not present

## 2023-12-08 DIAGNOSIS — N179 Acute kidney failure, unspecified: Secondary | ICD-10-CM | POA: Diagnosis not present

## 2023-12-08 DIAGNOSIS — R627 Adult failure to thrive: Secondary | ICD-10-CM | POA: Diagnosis present

## 2023-12-08 DIAGNOSIS — Z8659 Personal history of other mental and behavioral disorders: Secondary | ICD-10-CM | POA: Diagnosis not present

## 2023-12-08 DIAGNOSIS — G40901 Epilepsy, unspecified, not intractable, with status epilepticus: Principal | ICD-10-CM | POA: Diagnosis present

## 2023-12-08 DIAGNOSIS — Z7401 Bed confinement status: Secondary | ICD-10-CM | POA: Diagnosis not present

## 2023-12-08 DIAGNOSIS — E11649 Type 2 diabetes mellitus with hypoglycemia without coma: Secondary | ICD-10-CM | POA: Diagnosis not present

## 2023-12-08 DIAGNOSIS — F17210 Nicotine dependence, cigarettes, uncomplicated: Secondary | ICD-10-CM | POA: Diagnosis present

## 2023-12-08 DIAGNOSIS — Z881 Allergy status to other antibiotic agents status: Secondary | ICD-10-CM

## 2023-12-08 DIAGNOSIS — F0390 Unspecified dementia without behavioral disturbance: Secondary | ICD-10-CM | POA: Diagnosis not present

## 2023-12-08 DIAGNOSIS — N400 Enlarged prostate without lower urinary tract symptoms: Secondary | ICD-10-CM | POA: Diagnosis present

## 2023-12-08 DIAGNOSIS — E119 Type 2 diabetes mellitus without complications: Secondary | ICD-10-CM

## 2023-12-08 DIAGNOSIS — F0393 Unspecified dementia, unspecified severity, with mood disturbance: Secondary | ICD-10-CM | POA: Diagnosis present

## 2023-12-08 DIAGNOSIS — E8729 Other acidosis: Secondary | ICD-10-CM | POA: Diagnosis not present

## 2023-12-08 LAB — POCT I-STAT 7, (LYTES, BLD GAS, ICA,H+H)
Acid-base deficit: 1 mmol/L (ref 0.0–2.0)
Acid-base deficit: 2 mmol/L (ref 0.0–2.0)
Bicarbonate: 22.4 mmol/L (ref 20.0–28.0)
Bicarbonate: 23.1 mmol/L (ref 20.0–28.0)
Calcium, Ion: 1.15 mmol/L (ref 1.15–1.40)
Calcium, Ion: 1.2 mmol/L (ref 1.15–1.40)
HCT: 38 % — ABNORMAL LOW (ref 39.0–52.0)
HCT: 39 % (ref 39.0–52.0)
Hemoglobin: 12.9 g/dL — ABNORMAL LOW (ref 13.0–17.0)
Hemoglobin: 13.3 g/dL (ref 13.0–17.0)
O2 Saturation: 87 %
O2 Saturation: 97 %
Patient temperature: 37.4
Patient temperature: 37.4
Potassium: 3.3 mmol/L — ABNORMAL LOW (ref 3.5–5.1)
Potassium: 3.7 mmol/L (ref 3.5–5.1)
Sodium: 140 mmol/L (ref 135–145)
Sodium: 140 mmol/L (ref 135–145)
TCO2: 24 mmol/L (ref 22–32)
TCO2: 24 mmol/L (ref 22–32)
pCO2 arterial: 37.9 mmHg (ref 32–48)
pCO2 arterial: 38.7 mmHg (ref 32–48)
pH, Arterial: 7.372 (ref 7.35–7.45)
pH, Arterial: 7.395 (ref 7.35–7.45)
pO2, Arterial: 56 mmHg — ABNORMAL LOW (ref 83–108)
pO2, Arterial: 89 mmHg (ref 83–108)

## 2023-12-08 LAB — COMPREHENSIVE METABOLIC PANEL WITH GFR
ALT: 22 U/L (ref 0–44)
AST: 27 U/L (ref 15–41)
Albumin: 3.8 g/dL (ref 3.5–5.0)
Alkaline Phosphatase: 92 U/L (ref 38–126)
Anion gap: 17 — ABNORMAL HIGH (ref 5–15)
BUN: 15 mg/dL (ref 8–23)
CO2: 18 mmol/L — ABNORMAL LOW (ref 22–32)
Calcium: 9 mg/dL (ref 8.9–10.3)
Chloride: 104 mmol/L (ref 98–111)
Creatinine, Ser: 1.06 mg/dL (ref 0.61–1.24)
GFR, Estimated: >60 mL/min (ref >60)
Glucose, Bld: 140 mg/dL — ABNORMAL HIGH (ref 70–99)
Potassium: 3.8 mmol/L (ref 3.5–5.1)
Sodium: 139 mmol/L (ref 135–145)
Total Bilirubin: 0.7 mg/dL (ref 0.0–1.2)
Total Protein: 7.6 g/dL (ref 6.5–8.1)

## 2023-12-08 LAB — URINALYSIS, ROUTINE W REFLEX MICROSCOPIC
Bacteria, UA: NONE SEEN
Bilirubin Urine: NEGATIVE
Glucose, UA: NEGATIVE mg/dL
Hgb urine dipstick: NEGATIVE
Ketones, ur: NEGATIVE mg/dL
Leukocytes,Ua: NEGATIVE
Nitrite: NEGATIVE
Protein, ur: 100 mg/dL — AB
Specific Gravity, Urine: 1.016 (ref 1.005–1.030)
pH: 5 (ref 5.0–8.0)

## 2023-12-08 LAB — I-STAT CHEM 8, ED
BUN: 13 mg/dL (ref 8–23)
Calcium, Ion: 1.13 mmol/L — ABNORMAL LOW (ref 1.15–1.40)
Chloride: 106 mmol/L (ref 98–111)
Creatinine, Ser: 1 mg/dL (ref 0.61–1.24)
Glucose, Bld: 138 mg/dL — ABNORMAL HIGH (ref 70–99)
HCT: 42 % (ref 39.0–52.0)
Hemoglobin: 14.3 g/dL (ref 13.0–17.0)
Potassium: 3.9 mmol/L (ref 3.5–5.1)
Sodium: 142 mmol/L (ref 135–145)
TCO2: 18 mmol/L — ABNORMAL LOW (ref 22–32)

## 2023-12-08 LAB — CBC WITH DIFFERENTIAL/PLATELET
Abs Immature Granulocytes: 0.09 10*3/uL — ABNORMAL HIGH (ref 0.00–0.07)
Basophils Absolute: 0 10*3/uL (ref 0.0–0.1)
Basophils Relative: 0 %
Eosinophils Absolute: 0.2 10*3/uL (ref 0.0–0.5)
Eosinophils Relative: 1 %
HCT: 40.5 % (ref 39.0–52.0)
Hemoglobin: 13.6 g/dL (ref 13.0–17.0)
Immature Granulocytes: 1 %
Lymphocytes Relative: 12 %
Lymphs Abs: 2.4 10*3/uL (ref 0.7–4.0)
MCH: 29.9 pg (ref 26.0–34.0)
MCHC: 33.6 g/dL (ref 30.0–36.0)
MCV: 89 fL (ref 80.0–100.0)
Monocytes Absolute: 1.6 10*3/uL — ABNORMAL HIGH (ref 0.1–1.0)
Monocytes Relative: 8 %
Neutro Abs: 15.1 10*3/uL — ABNORMAL HIGH (ref 1.7–7.7)
Neutrophils Relative %: 78 %
Platelets: 286 10*3/uL (ref 150–400)
RBC: 4.55 MIL/uL (ref 4.22–5.81)
RDW: 13.4 % (ref 11.5–15.5)
WBC: 19.5 10*3/uL — ABNORMAL HIGH (ref 4.0–10.5)
nRBC: 0 % (ref 0.0–0.2)

## 2023-12-08 LAB — MAGNESIUM: Magnesium: 1.7 mg/dL (ref 1.7–2.4)

## 2023-12-08 LAB — GLUCOSE, CAPILLARY: Glucose-Capillary: 102 mg/dL — ABNORMAL HIGH (ref 70–99)

## 2023-12-08 LAB — CBG MONITORING, ED: Glucose-Capillary: 141 mg/dL — ABNORMAL HIGH (ref 70–99)

## 2023-12-08 MED ORDER — VALPROATE SODIUM 100 MG/ML IV SOLN
2400.0000 mg | Freq: Once | INTRAVENOUS | Status: AC
  Administered 2023-12-08: 2400 mg via INTRAVENOUS
  Filled 2023-12-08: qty 24

## 2023-12-08 MED ORDER — LORAZEPAM 2 MG/ML PO CONC: 2.0000 mg | Freq: Once | ORAL | Status: DC

## 2023-12-08 MED ORDER — VANCOMYCIN HCL 1500 MG/300ML IV SOLN
1500.0000 mg | Freq: Once | INTRAVENOUS | Status: AC
  Administered 2023-12-09: 1500 mg via INTRAVENOUS
  Filled 2023-12-08: qty 300

## 2023-12-08 MED ORDER — FAMOTIDINE 20 MG PO TABS
20.0000 mg | ORAL_TABLET | Freq: Two times a day (BID) | ORAL | Status: DC
  Administered 2023-12-09 – 2023-12-11 (×5): 20 mg
  Filled 2023-12-08 (×5): qty 1

## 2023-12-08 MED ORDER — DOCUSATE SODIUM 50 MG/5ML PO LIQD
100.0000 mg | Freq: Two times a day (BID) | ORAL | Status: DC | PRN
  Administered 2023-12-11: 100 mg
  Filled 2023-12-08: qty 10

## 2023-12-08 MED ORDER — INSULIN ASPART 100 UNIT/ML IJ SOLN
0.0000 [IU] | INTRAMUSCULAR | Status: DC
  Administered 2023-12-09 (×2): 2 [IU] via SUBCUTANEOUS
  Administered 2023-12-10 (×2): 3 [IU] via SUBCUTANEOUS
  Administered 2023-12-10 – 2023-12-11 (×5): 2 [IU] via SUBCUTANEOUS
  Administered 2023-12-11 (×2): 3 [IU] via SUBCUTANEOUS
  Administered 2023-12-11: 2 [IU] via SUBCUTANEOUS
  Administered 2023-12-11 – 2023-12-12 (×2): 3 [IU] via SUBCUTANEOUS
  Administered 2023-12-12 – 2023-12-13 (×4): 2 [IU] via SUBCUTANEOUS
  Administered 2023-12-14: 8 [IU] via SUBCUTANEOUS

## 2023-12-08 MED ORDER — PHENYLEPHRINE 80 MCG/ML (10ML) SYRINGE FOR IV PUSH (FOR BLOOD PRESSURE SUPPORT)
PREFILLED_SYRINGE | INTRAVENOUS | Status: AC
  Administered 2023-12-08: 80 ug via INTRAVENOUS
  Filled 2023-12-08: qty 10

## 2023-12-08 MED ORDER — LORAZEPAM 2 MG/ML IJ SOLN: 2.0000 mg | INTRAMUSCULAR | Status: DC | PRN

## 2023-12-08 MED ORDER — LEVETIRACETAM (KEPPRA) 500 MG/5 ML ADULT IV PUSH
4000.0000 mg | Freq: Once | INTRAVENOUS | Status: AC
  Administered 2023-12-08: 4000 mg via INTRAVENOUS
  Filled 2023-12-08: qty 40

## 2023-12-08 MED ORDER — ROCURONIUM BROMIDE 10 MG/ML (PF) SYRINGE
100.0000 mg | PREFILLED_SYRINGE | Freq: Once | INTRAVENOUS | Status: AC
  Administered 2023-12-08: 100 mg via INTRAVENOUS
  Filled 2023-12-08: qty 10

## 2023-12-08 MED ORDER — LABETALOL HCL 5 MG/ML IV SOLN
10.0000 mg | INTRAVENOUS | Status: DC | PRN
Start: 2023-12-08 — End: 2023-12-16
  Administered 2023-12-09 (×3): 10 mg via INTRAVENOUS
  Filled 2023-12-08 (×2): qty 4

## 2023-12-08 MED ORDER — PROPOFOL 10 MG/ML IV BOLUS
1.0000 mg/kg | Freq: Once | INTRAVENOUS | Status: AC
  Administered 2023-12-08: 60 mg via INTRAVENOUS

## 2023-12-08 MED ORDER — PROPOFOL 1000 MG/100ML IV EMUL
5.0000 ug/kg/min | INTRAVENOUS | Status: DC
  Administered 2023-12-08: 60 ug/kg/min via INTRAVENOUS
  Administered 2023-12-08: 5 ug/kg/min via INTRAVENOUS
  Administered 2023-12-09: 20 ug/kg/min via INTRAVENOUS
  Filled 2023-12-08 (×2): qty 100

## 2023-12-08 MED ORDER — PROPOFOL 1000 MG/100ML IV EMUL
INTRAVENOUS | Status: AC
  Administered 2023-12-08: 5 ug/kg/min via INTRAVENOUS
  Filled 2023-12-08: qty 100

## 2023-12-08 MED ORDER — ENOXAPARIN SODIUM 40 MG/0.4ML IJ SOSY
40.0000 mg | PREFILLED_SYRINGE | Freq: Every day | INTRAMUSCULAR | Status: DC
  Administered 2023-12-09 – 2023-12-16 (×7): 40 mg via SUBCUTANEOUS
  Filled 2023-12-08 (×8): qty 0.4

## 2023-12-08 MED ORDER — FENTANYL 2500MCG IN NS 250ML (10MCG/ML) PREMIX INFUSION
0.0000 ug/h | INTRAVENOUS | Status: DC
  Administered 2023-12-08: 25 ug/h via INTRAVENOUS
  Filled 2023-12-08: qty 250

## 2023-12-08 MED ORDER — SODIUM CHLORIDE 0.9 % IV SOLN
2.0000 g | Freq: Three times a day (TID) | INTRAVENOUS | Status: DC
  Administered 2023-12-08 – 2023-12-09 (×2): 2 g via INTRAVENOUS
  Filled 2023-12-08 (×2): qty 12.5

## 2023-12-08 MED ORDER — METRONIDAZOLE 500 MG/100ML IV SOLN
500.0000 mg | Freq: Two times a day (BID) | INTRAVENOUS | Status: DC
  Administered 2023-12-08: 500 mg via INTRAVENOUS
  Filled 2023-12-08 (×2): qty 100

## 2023-12-08 MED ORDER — FENTANYL BOLUS VIA INFUSION: 25.0000 ug | INTRAVENOUS | Status: DC | PRN

## 2023-12-08 MED ORDER — LORAZEPAM 2 MG/ML IJ SOLN
INTRAMUSCULAR | Status: AC
  Administered 2023-12-08: 4 mg via INTRAVENOUS
  Filled 2023-12-08: qty 2

## 2023-12-08 MED ORDER — LORAZEPAM 2 MG/ML IJ SOLN: 4.0000 mg | Freq: Once | INTRAMUSCULAR | Status: AC

## 2023-12-08 MED ORDER — LORAZEPAM 2 MG/ML IJ SOLN
INTRAMUSCULAR | Status: AC
  Administered 2023-12-08: 2 mg via INTRAVENOUS
  Filled 2023-12-08: qty 2

## 2023-12-08 MED ORDER — PROPOFOL 1000 MG/100ML IV EMUL: 5.0000 ug/kg/min | INTRAVENOUS | Status: DC

## 2023-12-08 MED ORDER — POLYETHYLENE GLYCOL 3350 17 G PO PACK
17.0000 g | PACK | Freq: Every day | ORAL | Status: DC | PRN
  Administered 2023-12-10: 17 g
  Filled 2023-12-08: qty 1

## 2023-12-08 MED ORDER — LACTATED RINGERS IV BOLUS
1000.0000 mL | Freq: Once | INTRAVENOUS | Status: AC
  Administered 2023-12-08: 1000 mL via INTRAVENOUS

## 2023-12-08 MED ORDER — LORAZEPAM 2 MG/ML IJ SOLN
2.0000 mg | Freq: Once | INTRAMUSCULAR | Status: AC
  Administered 2023-12-08: 2 mg via INTRAVENOUS

## 2023-12-08 MED ORDER — PHENYLEPHRINE 80 MCG/ML (10ML) SYRINGE FOR IV PUSH (FOR BLOOD PRESSURE SUPPORT)
80.0000 ug | PREFILLED_SYRINGE | INTRAVENOUS | Status: AC | PRN
  Filled 2023-12-08: qty 10

## 2023-12-08 MED ORDER — VANCOMYCIN HCL 1500 MG/300ML IV SOLN: 1500.0000 mg | INTRAVENOUS | Status: DC

## 2023-12-08 NOTE — ED Notes (Signed)
 Attempted to contact [facility] regarding pt disposition. No answer at this time with VM full

## 2023-12-08 NOTE — ED Notes (Addendum)
 Pt receiving IV Keppra  at this time; being pushed by Bridgette Campus, RN Keppra  paused for intubation  1917 propofol  60mg  given 1917 100 rocc given IV 1917 Keppra  IV resumed  1918 EDP suctioned pt 1918 ett past chords 1919 8 ett 24 at lips; bilateral breath sounds assessed by EDP  1925 temp foley placed by NT/RN 1932 OG 69fr placed

## 2023-12-08 NOTE — ED Notes (Signed)
[facility] present to transport pt

## 2023-12-08 NOTE — ED Notes (Signed)
 Patient transported to CT

## 2023-12-08 NOTE — Progress Notes (Signed)
 Pharmacy Antibiotic Note  Jacob Mcgrath. is a 68 y.o. male admitted on 12/08/2023 with sepsis.  Pharmacy has been consulted for vancomycin  dosing.  -pt also on Cefepime and Metronidazole  -Hx DM, seizures, CVA, dementia -intubated  -unknown source  Plan: Will order Vancomycin  1500 mg IV x 1 as loading dose. Will continue with Vancomycin  1500 mg IV Q 24 hrs. Goal AUC 400-550. Expected AUC: 504 Cmin 10.1 SCr used: 1.00 TBW<IBW  Continue to assess renal fxn, cultures, length of therapy, etc    Height: 5\' 9"  (175.3 cm) Weight: 67.5 kg (148 lb 12.8 oz) IBW/kg (Calculated) : 70.7  Temp (24hrs), Avg:99.4 F (37.4 C), Min:98.1 F (36.7 C), Max:99.9 F (37.7 C)  Recent Labs  Lab 12/08/23 1904 12/08/23 1908  WBC 19.5*  --   CREATININE 1.06 1.00    Estimated Creatinine Clearance: 68.4 mL/min (by C-G formula based on SCr of 1 mg/dL).    Allergies  Allergen Reactions   Amoxicillin Other (See Comments)    Unknown reaction per facility   Ampicillin Other (See Comments)    Unknown reaction per facility   Tetracyclines & Related Rash    Antimicrobials this admission: vancomycin  5/15 >>   cefepime 5/15 >>   Metronidazole  5/15>>  Dose adjustments this admission:    Microbiology results: 5/15 BCx:     UCx:      Sputum:      MRSA PCR:  ordered CXR 5/15 negative  Thank you for allowing pharmacy to be a part of this patient's care.  Thomasine Flick PharmD Clinical Pharmacist 12/08/2023

## 2023-12-08 NOTE — ED Notes (Addendum)
 SWAT RN at bedside attempting to find IV access  This RN attempted 2 times for IV access prior to intubation, both IV sites blew at insertion.

## 2023-12-08 NOTE — Progress Notes (Signed)
 Patient intubated by ER MD with 8.0 ET tube secured at 24 @ lip.  Equal BBS and positive CO2 color change.  Patient placed on the ventilator with following settings.  PRVC 600/18/40% +5

## 2023-12-08 NOTE — ED Notes (Signed)
 Pt started actively seizing, SWAT RN told EDP. EDP arrived to pt's room. EDP ordered 80mcg phenylephrine for BP; 60mg  propofol   2108 80mcg phenylephrine given 2109 60mg  propofol  given

## 2023-12-08 NOTE — ED Triage Notes (Signed)
 Pt arrives to Ed from [place] status epilepticus.

## 2023-12-08 NOTE — Progress Notes (Signed)
 Patient transported on ventilator to and from CT with no complications.   Patient back to ER room and continues on previous vent settings.

## 2023-12-08 NOTE — H&P (Signed)
 NAME:  Jacob Hoggarth., MRN:  [id number], DOB:  09-Nov-1955, LOS: 0 ADMISSION DATE:  12/08/2023, CONSULTATION DATE:  5/15 REFERRING MD:  Dr. Drury Geralds, CHIEF COMPLAINT:  Status epilepticus   History of Present Illness:  68 year old male with past medical history as below, which is significant for dementia, diabetes, stroke with left-sided weakness, and recent diagnosis of seizure in December 2024.  He was admitted in January with status epilepticus requiring intubation in January of this year.  Course was complicated by aspiration pneumonia.  He was discharged on Keppra .  He resides in some sort of facility and has a legal guardian.  He presented to [facility] emergency department 5/15 after having seizure witnessed by his roommate.  He did wake up after the seizure but only briefly and then had another seizure.  Facility reports he has been compliant with Keppra .  Upon EMS arrival he had been seizing for approximately 25 minutes and was given IM Versed  in the field without improvement.  Upon arrival to [facility] he had active seizure.  8 total milligrams of IV Ativan  were given without improvement.  He was intubated for airway protection.  Generalized seizure improved after intubation with continuous propofol  infusion but twitches were still observed.  The patient was transferred to [facility] for continuous EEG monitoring and neurology evaluation.  Pertinent  Medical History   has a past medical history of Dementia (HCC), Diabetes mellitus without complication (HCC), Hypertension, and Stroke (HCC).   Significant Hospital Events: Including procedures, antibiotic start and stop dates in addition to other pertinent events   5/15 admitted for status epilepticus. Intubated.   Interim History / Subjective:    Objective    Blood pressure 137/87, pulse (!) 115, temperature 99.3 F (37.4 C), resp. rate 14, height 5\' 9"  (1.753 m), weight 67.5 kg, SpO2 100%.    Vent Mode: PRVC FiO2 (%):  [40 %] 40  % Set Rate:  [14 bmp-18 bmp] 14 bmp Vt Set:  [570 mL-600 mL] 570 mL PEEP:  [5 cmH20] 5 cmH20 Plateau Pressure:  [14 cmH20-20 cmH20] 20 cmH20   Intake/Output Summary (Last 24 hours) at 12/08/2023 2327 Last data filed at 12/08/2023 2031 Gross per 24 hour  Intake --  Output 200 ml  Net -200 ml   Filed Weights   12/08/23 1958  Weight: 67.5 kg    Examination: General: adult male of normal body habitus in NAD HENT: /AT, PERRL, no JVD Lungs: Clear bilateral breath sounds Cardiovascular: RRR, no MRG Abdomen: Soft, Non-distended Extremities: no acute deformity or edema. Contracture of the L wrist/hand Neuro: Sedated propofol /fentanyl . Moves R to pain. LUE tremors to pain.   Resolved problem list   Assessment and Plan   Status epilepticus: with history of seizure. Admitted in January with same. CT head non-acute. - Appreciate neurology - AEDs per neuro - EEG pending  Endotracheal tube present - Full vent support - ABG pending - CXR reviewed - advance ETT 2 cm.  - VAP bundle - Await the OK from neurology for WUA and SBT.   Lactic acidosis: secondary to seizure. clearing - supportive care  Hypertension - hold home medications for now - PRN labetalol  to keep SBP < 170 mmHg  DM2 - CBG monitoring and SSI  Leukocytosis: likely reactive to seizure - defer antibiotics for now  Dementia Prior stroke with L sided deficits - Supportive care - hold baclofen  for now - ASA/plavix  when taking PO   Best Practice (right click and "Reselect  all SmartList Selections" daily)   Diet/type: NPO DVT prophylaxis LMWH Pressure ulcer(s): pressure ulcer assessment deferred  GI prophylaxis: H2B Lines: N/A Foley:  N/A Code Status:  full code Last date of multidisciplinary goals of care discussion [ ]   Labs   CBC: Recent Labs  Lab 12/08/23 1904 12/08/23 1908  WBC 19.5*  --   NEUTROABS 15.1*  --   HGB 13.6 14.3  HCT 40.5 42.0  MCV 89.0  --   PLT 286  --     Basic  Metabolic Panel: Recent Labs  Lab 12/08/23 1904 12/08/23 1908  NA 139 142  K 3.8 3.9  CL 104 106  CO2 18*  --   GLUCOSE 140* 138*  BUN 15 13  CREATININE 1.06 1.00  CALCIUM  9.0  --   MG 1.7  --    GFR: Estimated Creatinine Clearance: 68.4 mL/min (by C-G formula based on SCr of 1 mg/dL). Recent Labs  Lab 12/08/23 1904  WBC 19.5*    Liver Function Tests: Recent Labs  Lab 12/08/23 1904  AST 27  ALT 22  ALKPHOS 92  BILITOT 0.7  PROT 7.6  ALBUMIN 3.8   No results for input(s): "LIPASE", "AMYLASE" in the last 168 hours. No results for input(s): "AMMONIA" in the last 168 hours.  ABG    Component Value Date/Time   PHART 7.39 07/29/2023 0243   PCO2ART 40 07/29/2023 0243   PO2ART 127 (H) 07/29/2023 0243   HCO3 24.2 07/29/2023 0243   TCO2 18 (L) 12/08/2023 1908   ACIDBASEDEF 0.7 07/29/2023 0243   O2SAT 100 07/29/2023 0243     Coagulation Profile: No results for input(s): "INR", "PROTIME" in the last 168 hours.  Cardiac Enzymes: No results for input(s): "CKTOTAL", "CKMB", "CKMBINDEX", "TROPONINI" in the last 168 hours.  HbA1C: Hgb A1c MFr Bld  Date/Time Value Ref Range Status  07/01/2023 12:36 PM 5.4 4.8 - 5.6 % Final    Comment:    (NOTE) Pre diabetes:          5.7%-6.4%  Diabetes:              >6.4%  Glycemic control for   <7.0% adults with diabetes   10/03/2022 03:10 AM 5.6 4.8 - 5.6 % Final    Comment:    (NOTE)         Prediabetes: 5.7 - 6.4         Diabetes: >6.4         Glycemic control for adults with diabetes: <7.0     CBG: Recent Labs  Lab 12/08/23 1855 12/08/23 2303  GLUCAP 141* 102*    Review of Systems:   Patient is encephalopathic and/or intubated; therefore, history has been obtained from chart review.   Past Medical History:  He,  has a past medical history of Dementia (HCC), Diabetes mellitus without complication (HCC), Hypertension, and Stroke (HCC).   Surgical History:   Past Surgical History:  Procedure Laterality  Date   ABDOMINAL AORTOGRAM W/LOWER EXTREMITY N/A 10/04/2022   Procedure: ABDOMINAL AORTOGRAM W/LOWER EXTREMITY;  Surgeon: Dannis Dy, MD;  [facility] INVASIVE CV LAB;  Service: Cardiovascular;  Laterality: N/A;   ABDOMINAL AORTOGRAM W/LOWER EXTREMITY N/A 10/08/2022   Procedure: ABDOMINAL AORTOGRAM W/LOWER EXTREMITY;  Surgeon: Carlene Che, MD;  Location: [facility] INVASIVE CV LAB;  Service: Cardiovascular;  Laterality: N/A;   PERIPHERAL VASCULAR INTERVENTION  10/04/2022   Procedure: PERIPHERAL VASCULAR INTERVENTION;  Surgeon: Dannis Dy, MD;  Location: [facility] INVASIVE CV LAB;  Service: Cardiovascular;;   PERIPHERAL VASCULAR INTERVENTION Left 10/08/2022   Procedure: PERIPHERAL VASCULAR INTERVENTION;  Surgeon: Carlene Che, MD;  Location: [facility] INVASIVE CV LAB;  Service: Cardiovascular;  Laterality: Left;   TONSILLECTOMY       Social History:   reports that he has been smoking cigarettes. He does not have any smokeless tobacco history on file. He reports that he does not currently use alcohol. He reports that he does not currently use drugs.   Family History:  His family history is not on file.   Allergies Allergies  Allergen Reactions   Amoxicillin Other (See Comments)    Unknown reaction per facility   Ampicillin Other (See Comments)    Unknown reaction per facility   Tetracyclines & Related Rash     Home Medications  Prior to Admission medications   Medication Sig Start Date End Date Taking? Authorizing Provider  aspirin  EC 81 MG tablet Take 81 mg by mouth daily. Swallow whole.   Yes [provider]  atorvastatin  (LIPITOR ) 80 MG tablet Take 80 mg by mouth at bedtime. 08/14/21  Yes [provider]  Baclofen  5 MG TABS Take 1 tablet by mouth in the morning, at noon, and at bedtime. 06/16/23  Yes [provider]  clopidogrel  (PLAVIX ) 75 MG tablet Take 1 tablet (75 mg total) by mouth daily with breakfast. Patient taking differently: Take 75 mg  by mouth daily. 10/11/22  Yes Cala Castleman, MD  DULoxetine  (CYMBALTA ) 30 MG capsule Take 30 mg by mouth daily. 07/23/22  Yes [provider]  guaiFENesin (MUCINEX) 600 MG 12 hr tablet Take 600 mg by mouth 2 (two) times daily as needed for cough.   Yes [provider]  levETIRAcetam  (KEPPRA ) 750 MG tablet Take 1 tablet (750 mg total) by mouth 2 (two) times daily. 08/03/23  Yes Armenta Landau, MD  melatonin 3 MG TABS tablet Take 2 tablets (6 mg total) by mouth at bedtime. 03/03/23  Yes Colin Dawley, MD  metoprolol  tartrate (LOPRESSOR ) 25 MG tablet Take 1 tablet (25 mg total) by mouth 2 (two) times daily. 10/11/22  Yes Singh, Prashant K, MD  Multiple Vitamin (MULTIVITAMIN) tablet Take 1 tablet by mouth daily.   Yes [provider]  pantoprazole  (PROTONIX ) 40 MG tablet Take 1 tablet (40 mg total) by mouth daily. 10/11/22  Yes Singh, Prashant K, MD  PARoxetine  (PAXIL ) 40 MG tablet Take 40 mg by mouth daily. 06/26/23  Yes [provider]  tamsulosin  (FLOMAX ) 0.4 MG CAPS capsule Take 1 capsule (0.4 mg total) by mouth daily. 10/11/22  Yes Cala Castleman, MD     Critical care time: 49 minutes       Roz Cornelia, AGACNP-BC [facility]  See Amion for personal pager PCCM on call pager [phone] until 7pm. Please call Elink 7p-7a. [phone]  12/08/2023 11:40 PM

## 2023-12-08 NOTE — ED Notes (Signed)
 [facility] called for transport . Backed up , on waiting list

## 2023-12-08 NOTE — ED Notes (Signed)
 AC called to bring Depacon from pharmacy. AC to bring

## 2023-12-08 NOTE — ED Provider Notes (Addendum)
 [place] EMERGENCY DEPARTMENT AT [facility] Provider Note   CSN: [id number] Arrival date & time: 12/08/23  1835     History  Chief Complaint  Patient presents with   Seizures    Jacob Mcgrath. is a 68 y.o. male with PMH as listed below who presents with status epilepticus. History obtained by EMS. Was reportedly at his facility when had seizure activity witnessed by roommate, who then noted that he woke up briefly but lost consciousness and began seizing again. Was seizing approx 25 min prior to EMS arrival. EMS could not obtain access, gave 5 mg IN versed  with no improvement. Noted all-over shaking/twitching with leftward gaze. Has h/o seizures and status epilepticus in January. Takes Keppra  BID and per facility hadn't missed any doses. Also has h/o CVA w/ residual left-sided deficits, on aspirin /plavix . No reported head trauma/falls.   Patient seizing on arrival, being bagged. Level 5 caveat.    Past Medical History:  Diagnosis Date   Dementia (HCC)    Diabetes mellitus without complication (HCC)    Hypertension    Stroke (HCC)        Home Medications Prior to Admission medications   Medication Sig Start Date End Date Taking? Authorizing Provider  aspirin  EC 81 MG tablet Take 81 mg by mouth daily. Swallow whole.   Yes [provider]  atorvastatin  (LIPITOR ) 80 MG tablet Take 80 mg by mouth at bedtime. 08/14/21  Yes [provider]  Baclofen  5 MG TABS Take 1 tablet by mouth in the morning, at noon, and at bedtime. 06/16/23  Yes [provider]  clopidogrel  (PLAVIX ) 75 MG tablet Take 1 tablet (75 mg total) by mouth daily with breakfast. Patient taking differently: Take 75 mg by mouth daily. 10/11/22  Yes Singh, Prashant K, MD  DULoxetine  (CYMBALTA ) 30 MG capsule Take 30 mg by mouth daily. 07/23/22  Yes [provider]  guaiFENesin (MUCINEX) 600 MG 12 hr tablet Take 600 mg by mouth 2 (two) times daily as needed for cough.   Yes  [provider]  levETIRAcetam  (KEPPRA ) 750 MG tablet Take 1 tablet (750 mg total) by mouth 2 (two) times daily. 08/03/23  Yes Armenta Landau, MD  melatonin 3 MG TABS tablet Take 2 tablets (6 mg total) by mouth at bedtime. 03/03/23  Yes Colin Dawley, MD  metoprolol  tartrate (LOPRESSOR ) 25 MG tablet Take 1 tablet (25 mg total) by mouth 2 (two) times daily. 10/11/22  Yes Singh, Prashant K, MD  Multiple Vitamin (MULTIVITAMIN) tablet Take 1 tablet by mouth daily.   Yes [provider]  pantoprazole  (PROTONIX ) 40 MG tablet Take 1 tablet (40 mg total) by mouth daily. 10/11/22  Yes Singh, Prashant K, MD  PARoxetine  (PAXIL ) 40 MG tablet Take 40 mg by mouth daily. 06/26/23  Yes [provider]  tamsulosin  (FLOMAX ) 0.4 MG CAPS capsule Take 1 capsule (0.4 mg total) by mouth daily. 10/11/22  Yes Singh, Prashant K, MD      Allergies    Amoxicillin, Ampicillin, and Tetracyclines & related    Review of Systems   Review of Systems A 10 point review of systems was performed and is negative unless otherwise reported in HPI.  Physical Exam Updated Vital Signs BP 130/86   Pulse (!) 105   Temp 99.6 F (37.6 C)   Resp 18   Ht 5\' 9"  (1.753 m)   Wt 67.5 kg   SpO2 100%   BMI 21.97 kg/m  Physical Exam General: Acutely  ill-appearing elderly male, HEENT: NCAT, left-ward gaze, PERRLA, Sclera anicteric, MMM, trachea midline.  Cardiology: regular tachycardic rate, no murmurs/rubs/gallops. BL radial and DP pulses equal bilaterally.  Resp: Tachypneic. CTAB, no wheezes, rhonchi, crackles.  Abd: Soft, non-distended.  GU: Deferred. MSK: No peripheral edema or signs of trauma. Extremities without deformity. No cyanosis or clubbing. Skin: warm, diaphoretic.  Neuro: All-over twitching/shaking of all extremities, head turned to left and leftward gaze.   ED Results / Procedures / Treatments   Labs (all labs ordered are listed, but only abnormal results are displayed) Labs Reviewed  CBC  WITH DIFFERENTIAL/PLATELET - Abnormal; Notable for the following components:      Result Value   WBC 19.5 (*)    Neutro Abs 15.1 (*)    Monocytes Absolute 1.6 (*)    Abs Immature Granulocytes 0.09 (*)    All other components within normal limits  COMPREHENSIVE METABOLIC PANEL WITH GFR - Abnormal; Notable for the following components:   CO2 18 (*)    Glucose, Bld 140 (*)    Anion gap 17 (*)    All other components within normal limits  URINALYSIS, ROUTINE W REFLEX MICROSCOPIC - Abnormal; Notable for the following components:   Protein, ur 100 (*)    All other components within normal limits  CBG MONITORING, ED - Abnormal; Notable for the following components:   Glucose-Capillary 141 (*)    All other components within normal limits  I-STAT CHEM 8, ED - Abnormal; Notable for the following components:   Glucose, Bld 138 (*)    Calcium , Ion 1.13 (*)    TCO2 18 (*)    All other components within normal limits  CULTURE, BLOOD (ROUTINE X 2)  CULTURE, BLOOD (ROUTINE X 2)  MAGNESIUM   LEVETIRACETAM  LEVEL    EKG EKG Interpretation Date/Time:  Thursday Dec 08 2023 20:18:13 EDT Ventricular Rate:  122 PR Interval:  162 QRS Duration:  64 QT Interval:  321 QTC Calculation: 458 R Axis:   74  Text Interpretation: Sinus tachycardia Anteroseptal infarct, old Confirmed by Annita Kindle [phone]) on 12/08/2023 8:31:39 PM  Radiology CT Head Wo Contrast Result Date: 12/08/2023 CLINICAL DATA:  Status epilepticus, altered level of consciousness EXAM: CT HEAD WITHOUT CONTRAST TECHNIQUE: Contiguous axial images were obtained from the base of the skull through the vertex without intravenous contrast. RADIATION DOSE REDUCTION: This exam was performed according to the departmental dose-optimization program which includes automated exposure control, adjustment of the mA and/or kV according to patient size and/or use of iterative reconstruction technique. COMPARISON:  07/28/2023 FINDINGS: Brain: No acute  infarct or hemorrhage. Stable chronic ischemic changes within the right MCA territory. Lateral ventricles and midline structures are unremarkable. No acute extra-axial fluid collections. No mass effect. Vascular: No hyperdense vessel or unexpected calcification. Skull: Normal. Negative for fracture or focal lesion. Sinuses/Orbits: No acute finding. Other: None. IMPRESSION: 1. Stable head CT, no acute intracranial process. Electronically Signed   By: Bobbye Burrow M.D.   On: 12/08/2023 20:44   DG Chest Portable 1 View Result Date: 12/08/2023 CLINICAL DATA:  Intubated, status epilepticus EXAM: PORTABLE CHEST 1 VIEW COMPARISON:  07/29/2023 FINDINGS: Single frontal view of the chest demonstrates endotracheal tube overlying tracheal air column, tip 6 cm above carina. Enteric catheter passes below diaphragm tip projecting over gastric fundus. Cardiac silhouette is unremarkable. Stable bibasilar scarring. No airspace disease, effusion, or pneumothorax. IMPRESSION: 1. Support devices as above. 2. No acute intrathoracic process.  Stable bibasilar scarring. Electronically Signed   By: Bambi Lever  Bevin Bucks M.D.   On: 12/08/2023 20:22    Procedures .Critical Care  Performed by: Merdis Stalling, MD Authorized by: Merdis Stalling, MD   Critical care provider statement:    Critical care time (minutes):  100   Critical care was necessary to treat or prevent imminent or life-threatening deterioration of the following conditions:  CNS failure or compromise   Critical care was time spent personally by me on the following activities:  Development of treatment plan with patient or surrogate, discussions with consultants, evaluation of patient's response to treatment, examination of patient, ordering and review of laboratory studies, ordering and review of radiographic studies, ordering and performing treatments and interventions, pulse oximetry, re-evaluation of patient's condition, review of old charts, ventilator management  and vascular access procedures   Care discussed with: accepting provider at another facility   Procedure Name: Intubation Date/Time: 12/08/2023 9:48 PM  Performed by: Merdis Stalling, MDPre-anesthesia Checklist: Patient identified, Emergency Drugs available, Suction available, Patient being monitored and Timeout performed Preoxygenation: Pre-oxygenation with 100% oxygen Induction Type: Rapid sequence Ventilation: Mask ventilation with difficulty Laryngoscope Size: Glidescope and 4 Grade View: Grade I Tube size: 8.0 mm Number of attempts: 1 Placement Confirmation: ETT inserted through vocal cords under direct vision, Positive ETCO2, Breath sounds checked- equal and bilateral and CO2 detector Tube secured with: ETT holder Dental Injury: Teeth and Oropharynx as per pre-operative assessment  Difficulty Due To: Difficulty was unanticipated    Central Line  Date/Time: 12/08/2023 9:49 PM  Performed by: Merdis Stalling, MD Authorized by: Merdis Stalling, MD   Consent:    Consent obtained:  Emergent situation Universal protocol:    Patient identity confirmed:  Arm band Pre-procedure details:    Indication(s): central venous access and insufficient peripheral access     Hand hygiene: Hand hygiene performed prior to insertion     Sterile barrier technique: All elements of maximal sterile technique followed     Skin preparation:  Chlorhexidine    Skin preparation agent: Skin preparation agent completely dried prior to procedure   Sedation:    Sedation type:  Deep Anesthesia:    Anesthesia method:  Local infiltration   Local anesthetic:  Lidocaine  1% WITH epi Procedure details:    Location:  L femoral   Patient position:  Supine   Procedural supplies:  Triple lumen   Landmarks identified: yes     Ultrasound guidance: yes     Ultrasound guidance timing: real time     Sterile ultrasound techniques: Sterile gel and sterile probe covers were used     Number of attempts:  1   Successful  placement: yes   Post-procedure details:    Post-procedure:  Dressing applied and line sutured   Assessment:  Blood return through all ports and free fluid flow   Procedure completion:  Tolerated well, no immediate complications .Ultrasound ED Peripheral IV (Provider)  Date/Time: 12/08/2023 9:59 PM  Performed by: Merdis Stalling, MD Authorized by: Merdis Stalling, MD   Procedure details:    Indications: multiple failed IV attempts and poor IV access     Skin Prep: chlorhexidine  gluconate     Location:  Left AC   Angiocath:  20 G (20 long)   Bedside Ultrasound Guided: Yes     Images: not archived     Patient tolerated procedure without complications: Yes     Dressing applied: Yes       Medications Ordered in ED Medications  valproate (DEPACON) 2,400 mg in  dextrose  5 % 50 mL IVPB (2,400 mg Intravenous New Bag/Given 12/08/23 2125)  fentaNYL  in NS (82mcg/ml) infusion-PREMIX (10 mcg/hr Intravenous New Bag/Given 12/08/23 2049)  propofol  (DIPRIVAN ) 1000 MG/100ML infusion (60 mcg/kg/min  67.5 kg Intravenous Bolus 12/08/23 2109)  levETIRAcetam  (KEPPRA ) undiluted injection 4,000 mg (4,000 mg Intravenous Given 12/08/23 1900)  LORazepam  (ATIVAN ) injection 2 mg (2 mg Intravenous Given 12/08/23 1853)  LORazepam  (ATIVAN ) injection 4 mg (4 mg Intravenous Given 12/08/23 1855)  PHENYLephrine 80 mcg/ml in normal saline Adult IV Push Syringe (For Blood Pressure Support) (80 mcg Intravenous Given 12/08/23 2108)  propofol  (DIPRIVAN ) 10 mg/mL bolus/IV push 67.5 mg (60 mg Intravenous Given 12/08/23 1917)  rocuronium  (ZEMURON ) injection 100 mg (100 mg Intravenous Given 12/08/23 1917)  lactated ringers  bolus 1,000 mL (1,000 mLs Intravenous New Bag/Given 12/08/23 2042)    ED Course/ Medical Decision Making/ A&P                          Medical Decision Making Amount and/or Complexity of Data Reviewed Labs: ordered. Decision-making details documented in ED Course. Radiology: ordered.  Decision-making details documented in ED Course.  Risk Prescription drug management. Decision regarding hospitalization.    This patient presents to the ED for concern of status epilepticus, this involves an extensive number of treatment options, and is a complaint that carries with it a high risk of complications and morbidity.  I considered the following differential and admission for this acute, potentially life threatening condition.   MDM:    Patient presents in status epilepticus, with diffuse twitching, leftward gaze, diaphoresis, tachycardia, tachypnea. Patient was given 5 mg IN versed  by EMS without clinical change, then 4 mg x 2 IV ativan  on arrival to ED with no change. 4 g IV keppra  was infused while preparing for intubation, and 1 mg/kg of proprofol was given with rocuronium  during intubation, prior to rocuronium  was given, twitching/shaking seemed to stop. Patient was successfully intubated. Labs show no severe electrolyte derangements, hypo/hyperglycemia. CTH without ICH, no e/o head trauma on exam. CXR without PNA. Given fluids for tachycardia. While intubated he continues to have intermittent shaking/twitching requiring boluses of 1 mg/kg propofol , and valproate 40 mg/kg IV is given as well. He has leukocytosis and is tachycardic and has low grade fever to 99.4 F, so blood cultures are drawn and fluids/antibiotics given. UA without signs of infection, temp foley placed. Consulted to neurology and intensivist.   Clinical Course as of 12/08/23 2158  Thu Dec 08, 2023  2003 DG Chest Portable 1 View ETT and OG tube in appropriate positions [HN]  2015 D/w Dr. Alecia Ames with neurology who recommends trasnfer to [facility] emergently for EEG. Consulted to intensivist. [HN]  2115 Patient with recurrent twitching, bolused 1 mg/kg of propofol  from infusion, and twitching stopped. Awaiting valproate from pharmacy.  [HN]  2116 CT Head Wo Contrast 1. Stable head CT, no acute intracranial  process. [HN]  2157 Anion gap(!): 17 [HN]  2157 CO2(!): 18 Likely lactic acidosis [HN]  2157 WBC(!): 19.5 +leukocytosis [HN]    Clinical Course User Index [HN] Merdis Stalling, MD    Labs: I Ordered, and personally interpreted labs.  The pertinent results include:  those listed above  Imaging Studies ordered: I ordered imaging studies including CTH, CXR I independently visualized and interpreted imaging. I agree with the radiologist interpretation  Additional history obtained from chart review, EMS.    Cardiac Monitoring: The patient was maintained on a cardiac  monitor.  I personally viewed and interpreted the cardiac monitored which showed an underlying rhythm of: sinus tachycardia  Reevaluation: After the interventions noted above, I reevaluated the patient and found that they have :improved  Social Determinants of Health: Lives at [facility] SNF  Disposition:  Admit to ICU at [facility] with neurology following  Co morbidities that complicate the patient evaluation  Past Medical History:  Diagnosis Date   Dementia (HCC)    Diabetes mellitus without complication (HCC)    Hypertension    Stroke (HCC)      Medicines Meds ordered this encounter  Medications   LORazepam  (ATIVAN ) 2 MG/ML injection    Eanes, Morgan P: cabinet override   levETIRAcetam  (KEPPRA ) undiluted injection 4,000 mg   DISCONTD: LORazepam  (ATIVAN ) 2 MG/ML concentrated solution 2 mg   DISCONTD: LORazepam  (ATIVAN ) 2 MG/ML concentrated solution 2 mg   LORazepam  (ATIVAN ) injection 2 mg   valproate (DEPACON) 2,400 mg in dextrose  5 % 50 mL IVPB   LORazepam  (ATIVAN ) injection 4 mg   LORazepam  (ATIVAN ) 2 MG/ML injection    Merlene Stark A: cabinet override   PHENYLephrine 80 mcg/ml in normal saline Adult IV Push Syringe (For Blood Pressure Support)   propofol  (DIPRIVAN ) 10 mg/mL bolus/IV push 67.5 mg   rocuronium  (ZEMURON ) injection 100 mg   propofol  (DIPRIVAN ) 1000 MG/100ML infusion    Pruitt,  Ginger M: cabinet override   DISCONTD: propofol  (DIPRIVAN ) 1000 MG/100ML infusion   fentaNYL  in NS (3mcg/ml) infusion-PREMIX   lactated ringers  bolus 1,000 mL   propofol  (DIPRIVAN ) 1000 MG/100ML infusion   DISCONTD: phenylephrine 80 mcg/10 mL injection    Bailey, Jamie S: cabinet override    I have reviewed the patients home medicines and have made adjustments as needed  Problem List / ED Course: Problem List Items Addressed This Visit       Other   * (Principal) Status epilepticus (HCC) - Primary   Relevant Medications   valproate (DEPACON) 2,400 mg in dextrose  5 % 50 mL IVPB        Merdis Stalling, MD 12/08/23 2200

## 2023-12-08 NOTE — Progress Notes (Signed)
 eLink Physician-Brief Progress Note Patient Name: Jacob Mcgrath. DOB: June 15, 1956 MRN: [id number]   Date of Service  12/08/2023  HPI/Events of Note  Patient with a history of seizures admitted with status epilepticus, altered mental status, and acute hypoxemic / hypercapnic respiratory failure requiring intubation and mechanical ventilation.Jacob Mcgrath  eICU Interventions  New Patient Evaluation.        Jacob Mcgrath Jacob Mcgrath Jacob Mcgrath 12/08/2023, 11:26 PM

## 2023-12-08 NOTE — ED Notes (Signed)
 Verbal order by Drury Geralds to give an additional 4 more milligrams of ativan  given at 1900. Pt has had a total of 8mg  of IV ativan  thus far

## 2023-12-08 NOTE — ED Notes (Signed)
 Lab at bedside

## 2023-12-08 NOTE — ED Notes (Signed)
 X-ray at bedside

## 2023-12-08 NOTE — ED Notes (Signed)
[facility] en route.  °

## 2023-12-09 ENCOUNTER — Inpatient Hospital Stay (HOSPITAL_COMMUNITY)

## 2023-12-09 DIAGNOSIS — J9601 Acute respiratory failure with hypoxia: Secondary | ICD-10-CM

## 2023-12-09 DIAGNOSIS — E119 Type 2 diabetes mellitus without complications: Secondary | ICD-10-CM | POA: Diagnosis not present

## 2023-12-09 DIAGNOSIS — G40901 Epilepsy, unspecified, not intractable, with status epilepticus: Secondary | ICD-10-CM | POA: Diagnosis not present

## 2023-12-09 DIAGNOSIS — D72829 Elevated white blood cell count, unspecified: Secondary | ICD-10-CM | POA: Diagnosis not present

## 2023-12-09 LAB — GLUCOSE, CAPILLARY
Glucose-Capillary: 77 mg/dL (ref 70–99)
Glucose-Capillary: 56 mg/dL — ABNORMAL LOW (ref 70–99)
Glucose-Capillary: 87 mg/dL (ref 70–99)
Glucose-Capillary: 103 mg/dL — ABNORMAL HIGH (ref 70–99)
Glucose-Capillary: 127 mg/dL — ABNORMAL HIGH (ref 70–99)
Glucose-Capillary: 136 mg/dL — ABNORMAL HIGH (ref 70–99)

## 2023-12-09 LAB — AMMONIA: Ammonia: 19 umol/L (ref 9–35)

## 2023-12-09 LAB — CBC
HCT: 37.2 % — ABNORMAL LOW (ref 39.0–52.0)
Hemoglobin: 12.3 g/dL — ABNORMAL LOW (ref 13.0–17.0)
MCH: 29.2 pg (ref 26.0–34.0)
MCHC: 33.1 g/dL (ref 30.0–36.0)
MCV: 88.4 fL (ref 80.0–100.0)
Platelets: 260 10*3/uL (ref 150–400)
RBC: 4.21 MIL/uL — ABNORMAL LOW (ref 4.22–5.81)
RDW: 13.3 % (ref 11.5–15.5)
WBC: 15.2 10*3/uL — ABNORMAL HIGH (ref 4.0–10.5)
nRBC: 0 % (ref 0.0–0.2)

## 2023-12-09 LAB — BASIC METABOLIC PANEL WITH GFR
Anion gap: 9 (ref 5–15)
BUN: 13 mg/dL (ref 8–23)
CO2: 22 mmol/L (ref 22–32)
Calcium: 8.4 mg/dL — ABNORMAL LOW (ref 8.9–10.3)
Chloride: 108 mmol/L (ref 98–111)
Creatinine, Ser: 1.01 mg/dL (ref 0.61–1.24)
GFR, Estimated: >60 mL/min (ref >60)
Glucose, Bld: 106 mg/dL — ABNORMAL HIGH (ref 70–99)
Potassium: 3.6 mmol/L (ref 3.5–5.1)
Sodium: 139 mmol/L (ref 135–145)

## 2023-12-09 LAB — MRSA NEXT GEN BY PCR, NASAL: MRSA by PCR Next Gen: DETECTED — AB

## 2023-12-09 LAB — LEVETIRACETAM LEVEL: Levetiracetam Lvl: 18.8 ug/mL (ref 10.0–40.0)

## 2023-12-09 LAB — MAGNESIUM: Magnesium: 1.5 mg/dL — ABNORMAL LOW (ref 1.7–2.4)

## 2023-12-09 LAB — VALPROIC ACID LEVEL: Valproic Acid Lvl: 64 ug/mL (ref 50–100)

## 2023-12-09 LAB — PROCALCITONIN: Procalcitonin: 0.23 ng/mL

## 2023-12-09 LAB — TRIGLYCERIDES: Triglycerides: 35 mg/dL (ref <150)

## 2023-12-09 LAB — PHOSPHORUS: Phosphorus: 3.3 mg/dL (ref 2.5–4.6)

## 2023-12-09 MED ORDER — MUPIROCIN 2 % EX OINT
1.0000 | TOPICAL_OINTMENT | Freq: Two times a day (BID) | CUTANEOUS | Status: AC
  Administered 2023-12-09 – 2023-12-13 (×10): 1 via NASAL
  Filled 2023-12-09 (×2): qty 22

## 2023-12-09 MED ORDER — OSMOLITE 1.5 CAL PO LIQD
1000.0000 mL | ORAL | Status: DC
  Administered 2023-12-09: 1000 mL
  Filled 2023-12-09 (×2): qty 1000

## 2023-12-09 MED ORDER — ADULT MULTIVITAMIN W/MINERALS CH
1.0000 | ORAL_TABLET | Freq: Every day | ORAL | Status: DC
  Administered 2023-12-09 – 2023-12-11 (×3): 1
  Filled 2023-12-09 (×3): qty 1

## 2023-12-09 MED ORDER — PAROXETINE HCL 20 MG PO TABS
40.0000 mg | ORAL_TABLET | Freq: Every day | ORAL | Status: DC
  Administered 2023-12-09 – 2023-12-11 (×3): 40 mg
  Filled 2023-12-09 (×4): qty 2

## 2023-12-09 MED ORDER — DEXTROSE 5 % IV SOLN: INTRAVENOUS | Status: DC

## 2023-12-09 MED ORDER — BACLOFEN 10 MG PO TABS
5.0000 mg | ORAL_TABLET | Freq: Three times a day (TID) | ORAL | Status: DC
  Administered 2023-12-09 – 2023-12-11 (×8): 5 mg
  Filled 2023-12-09 (×8): qty 1

## 2023-12-09 MED ORDER — CLOPIDOGREL BISULFATE 75 MG PO TABS
75.0000 mg | ORAL_TABLET | Freq: Every day | ORAL | Status: DC
  Administered 2023-12-09 – 2023-12-11 (×3): 75 mg
  Filled 2023-12-09 (×3): qty 1

## 2023-12-09 MED ORDER — CHLORHEXIDINE GLUCONATE CLOTH 2 % EX PADS
6.0000 | MEDICATED_PAD | Freq: Every day | CUTANEOUS | Status: AC
  Administered 2023-12-08 – 2023-12-12 (×5): 6 via TOPICAL

## 2023-12-09 MED ORDER — ACETAMINOPHEN 160 MG/5ML PO SOLN: 650.0000 mg | ORAL | Status: DC | PRN

## 2023-12-09 MED ORDER — ORAL CARE MOUTH RINSE
15.0000 mL | OROMUCOSAL | Status: DC
  Administered 2023-12-09 – 2023-12-13 (×44): 15 mL via OROMUCOSAL

## 2023-12-09 MED ORDER — METOPROLOL TARTRATE 25 MG PO TABS
25.0000 mg | ORAL_TABLET | Freq: Two times a day (BID) | ORAL | Status: DC
Start: 2023-12-09 — End: 2023-12-12
  Administered 2023-12-09 – 2023-12-11 (×5): 25 mg
  Filled 2023-12-09 (×5): qty 1

## 2023-12-09 MED ORDER — ORAL CARE MOUTH RINSE: 15.0000 mL | OROMUCOSAL | Status: DC | PRN

## 2023-12-09 MED ORDER — POTASSIUM CHLORIDE 20 MEQ PO PACK
40.0000 meq | PACK | Freq: Once | ORAL | Status: AC
  Administered 2023-12-09: 40 meq
  Filled 2023-12-09: qty 2

## 2023-12-09 MED ORDER — PROSOURCE TF20 ENFIT COMPATIBL EN LIQD
60.0000 mL | Freq: Every day | ENTERAL | Status: DC
  Administered 2023-12-09 – 2023-12-11 (×3): 60 mL
  Filled 2023-12-09 (×3): qty 60

## 2023-12-09 MED ORDER — DEXTROSE 50 % IV SOLN
INTRAVENOUS | Status: AC
  Administered 2023-12-09: 50 mL
  Filled 2023-12-09: qty 50

## 2023-12-09 MED ORDER — MAGNESIUM SULFATE 4 GM/100ML IV SOLN
INTRAVENOUS | Status: AC
  Filled 2023-12-09: qty 100

## 2023-12-09 MED ORDER — SODIUM CHLORIDE 0.9 % IV SOLN: INTRAVENOUS | Status: AC | PRN

## 2023-12-09 MED ORDER — ASPIRIN 81 MG PO CHEW
81.0000 mg | CHEWABLE_TABLET | Freq: Every day | ORAL | Status: DC
  Administered 2023-12-09 – 2023-12-11 (×3): 81 mg
  Filled 2023-12-09 (×3): qty 1

## 2023-12-09 MED ORDER — MAGNESIUM SULFATE 4 GM/100ML IV SOLN
4.0000 g | Freq: Once | INTRAVENOUS | Status: AC
  Administered 2023-12-09: 4 g via INTRAVENOUS
  Filled 2023-12-09: qty 100

## 2023-12-09 MED ORDER — LEVETIRACETAM (KEPPRA) 500 MG/5 ML ADULT IV PUSH
1500.0000 mg | Freq: Two times a day (BID) | INTRAVENOUS | Status: DC
  Administered 2023-12-09 – 2023-12-11 (×5): 1500 mg via INTRAVENOUS
  Filled 2023-12-09 (×6): qty 15

## 2023-12-09 MED ORDER — THIAMINE MONONITRATE 100 MG PO TABS
100.0000 mg | ORAL_TABLET | Freq: Every day | ORAL | Status: DC
  Administered 2023-12-09 – 2023-12-11 (×3): 100 mg
  Filled 2023-12-09 (×3): qty 1

## 2023-12-09 MED ORDER — VALPROATE SODIUM 100 MG/ML IV SOLN
500.0000 mg | Freq: Three times a day (TID) | INTRAVENOUS | Status: DC
  Administered 2023-12-09 – 2023-12-10 (×5): 500 mg via INTRAVENOUS
  Administered 2023-12-10: 499.9995 mg via INTRAVENOUS
  Administered 2023-12-11: 500 mg via INTRAVENOUS
  Filled 2023-12-09 (×2): qty 500
  Filled 2023-12-09 (×5): qty 5

## 2023-12-09 NOTE — Consult Note (Incomplete)
 NEUROLOGY CONSULT NOTE   Date of service: Dec 09, 2023 Patient Name: Jacob Mcgrath. MRN:  [id number] DOB:  09-Jul-1956 Chief Complaint: "***" Requesting Provider: Claven Cumming, MD  History of Present Illness  Sevin Pacha. is a 68 y.o. male with hx of ***  LKW: *** Modified rankin score: {Modified Rankin Scale:21264} IV Thrombolysis: ***Yes, *** No (reason) EVT: ***Yes, *** No (reason) ICH Score:***  NIHSS components Score: Comment  1a Level of Conscious 0[]  1[]  2[]  3[]      1b LOC Questions 0[]  1[]  2[]       1c LOC Commands 0[]  1[]  2[]       2 Best Gaze 0[]  1[]  2[]       3 Visual 0[]  1[]  2[]  3[]      4 Facial Palsy 0[]  1[]  2[]  3[]      5a Motor Arm - left 0[]  1[]  2[]  3[]  4[]  UN[]    5b Motor Arm - Right 0[]  1[]  2[]  3[]  4[]  UN[]    6a Motor Leg - Left 0[]  1[]  2[]  3[]  4[]  UN[]    6b Motor Leg - Right 0[]  1[]  2[]  3[]  4[]  UN[]    7 Limb Ataxia 0[]  1[]  2[]  UN[]      8 Sensory 0[]  1[]  2[]  UN[]      9 Best Language 0[]  1[]  2[]  3[]      10 Dysarthria 0[]  1[]  2[]  UN[]      11 Extinct. and Inattention 0[]  1[]  2[]       TOTAL:       ROS  ***Comprehensive ROS performed and pertinent positives documented in HPI  ***Unable to ascertain due to ***  Past History   Past Medical History:  Diagnosis Date  . Dementia (HCC)   . Diabetes mellitus without complication (HCC)   . Hypertension   . Stroke [facility])     Past Surgical History:  Procedure Laterality Date  . ABDOMINAL AORTOGRAM W/LOWER EXTREMITY N/A 10/04/2022   Procedure: ABDOMINAL AORTOGRAM W/LOWER EXTREMITY;  Surgeon: Dannis Dy, MD;  Location[facility] INVASIVE CV LAB;  Service: Cardiovascular;  Laterality: N/A;  . ABDOMINAL AORTOGRAM W/LOWER EXTREMITY N/A 10/08/2022   Procedure: ABDOMINAL AORTOGRAM W/LOWER EXTREMITY;  Surgeon: Carlene Che, MD;  Location: [facility] INVASIVE CV LAB;  Service: Cardiovascular;  Laterality: N/A;  . PERIPHERAL VASCULAR INTERVENTION  10/04/2022   Procedure: PERIPHERAL VASCULAR INTERVENTION;  Surgeon:  Dannis Dy, MD;  Location: [facility] INVASIVE CV LAB;  Service: Cardiovascular;;  . PERIPHERAL VASCULAR INTERVENTION Left 10/08/2022   Procedure: PERIPHERAL VASCULAR INTERVENTION;  Surgeon: Carlene Che, MD;  Location: [facility] INVASIVE CV LAB;  Service: Cardiovascular;  Laterality: Left;  . TONSILLECTOMY      Family History: History reviewed. No pertinent family history.  Social History  reports that he has been smoking cigarettes. He does not have any smokeless tobacco history on file. He reports that he does not currently use alcohol. He reports that he does not currently use drugs.  Allergies  Allergen Reactions  . Amoxicillin Other (See Comments)    Unknown reaction per facility  . Ampicillin Other (See Comments)    Unknown reaction per facility  . Tetracyclines & Related Rash    Medications   Current Facility-Administered Medications:  .  ceFEPIme (MAXIPIME) 2 g in sodium chloride  0.9 % 100 mL IVPB, 2 g, Intravenous, Q8H, Merdis Stalling, MD, Last Rate: 200 mL/hr at 12/08/23 2324, 2 g at 12/08/23 2324 .  docusate (COLACE) 50 MG/5ML liquid 100 mg, 100 mg, Per Tube, BID PRN, Lemmie Pyo, NP .  enoxaparin  (  LOVENOX ) injection 40 mg, 40 mg, Subcutaneous, Daily, Lemmie Pyo, NP .  famotidine  (PEPCID ) tablet 20 mg, 20 mg, Per Tube, BID, Lemmie Pyo, NP .  fentaNYL  (SUBLIMAZE ) bolus via infusion 25-100 mcg, 25-100 mcg, Intravenous, Q15 min PRN, Lemmie Pyo, NP .  fentaNYL  in NS (68mcg/ml) infusion-PREMIX, 0-400 mcg/hr, Intravenous, Continuous, Merdis Stalling, MD, Last Rate: 2.5 mL/hr at Dec 24, 2023 2049, 25 mcg/hr at 12/24/23 2049 .  insulin  aspart (novoLOG ) injection 0-15 Units, 0-15 Units, Subcutaneous, Q4H, Lemmie Pyo, NP .  labetalol  (NORMODYNE ) injection 10 mg, 10 mg, Intravenous, Q10 min PRN, Lemmie Pyo, NP .  LORazepam  (ATIVAN ) injection 2 mg, 2 mg, Intravenous, Q5 Min x 3 PRN, Lemmie Pyo, NP .  metroNIDAZOLE  (FLAGYL ) IVPB 500 mg, 500  mg, Intravenous, Q12H, Merdis Stalling, MD, Last Rate: 100 mL/hr at Dec 24, 2023 2340, 500 mg at Dec 24, 2023 2340 .  polyethylene glycol (MIRALAX  / GLYCOLAX ) packet 17 g, 17 g, Per Tube, Daily PRN, Lemmie Pyo, NP .  propofol  (DIPRIVAN ) 1000 MG/100ML infusion, 5-80 mcg/kg/min, Intravenous, Continuous, Scotty Cyphers, RPH, Last Rate: 8.1 mL/hr at 12-24-23 2158, 20 mcg/kg/min at 12-24-2023 2158 .  vancomycin  (VANCOREADY) IVPB 1500 mg/300 mL, 1,500 mg, Intravenous, Once, Scotty Cyphers, RPH .  vancomycin  (VANCOREADY) IVPB 1500 mg/300 mL, 1,500 mg, Intravenous, Q24H, Scotty Cyphers, RPH  Vitals   Vitals:   24-Dec-2023 2130 Dec 24, 2023 2145 Dec 24, 2023 2200 12/24/23 2300  BP: (!) 182/95 130/86 137/87   Pulse: (!) 115 (!) 105  (!) 115  Resp: 18 18 18 14   Temp: 99.5 F (37.5 C) 99.6 F (37.6 C) 99.3 F (37.4 C)   SpO2: 100% 100%  100%  Weight:      Height:        Body mass index is 21.97 kg/m.  Physical Exam   Constitutional: Appears well-developed and well-nourished. *** Psych: Affect appropriate to situation. *** Eyes: No scleral injection. *** HENT: No OP obstruction. *** Head: Normocephalic. *** Cardiovascular: Normal rate and regular rhythm. *** Respiratory: Effort normal, non-labored breathing. *** GI: Soft.  No distension. There is no tenderness. *** Skin: WDI. ***  Neurologic Examination   ***  Labs/Imaging/Neurodiagnostic studies   CBC:  Recent Labs  Lab Dec 24, 2023 1904 12-24-2023 1908 24-Dec-2023 2335 Dec 24, 2023 2348  WBC 19.5*  --   --   --   NEUTROABS 15.1*  --   --   --   HGB 13.6   < > 13.3 12.9*  HCT 40.5   < > 39.0 38.0*  MCV 89.0  --   --   --   PLT 286  --   --   --    < > = values in this interval not displayed.   Basic Metabolic Panel:  Lab Results  Component Value Date   NA 140 Dec 24, 2023   K 3.7 2023-12-24   CO2 18 (L) 12/24/2023   GLUCOSE 138 (H) 12/24/2023   BUN 13 2023-12-24   CREATININE 1.00 12/24/23   CALCIUM  9.0 2023-12-24   GFRNONAA >60  24-Dec-2023   Lipid Panel:  Lab Results  Component Value Date   LDLCALC 40 10/05/2022   HgbA1c:  Lab Results  Component Value Date   HGBA1C 5.4 07/01/2023   Urine Drug Screen:     Component Value Date/Time   LABOPIA NONE DETECTED 07/01/2023 1127   COCAINSCRNUR NONE DETECTED 07/01/2023 1127   LABBENZ NONE DETECTED 07/01/2023 1127   AMPHETMU NONE DETECTED 07/01/2023 1127  THCU NONE DETECTED 07/01/2023 1127   LABBARB NONE DETECTED 07/01/2023 1127    Alcohol Level     Component Value Date/Time   ETH <10 07/01/2023 1236   INR  Lab Results  Component Value Date   INR 1.3 (H) 07/02/2023   APTT  Lab Results  Component Value Date   APTT 32 07/02/2023   AED levels:  Lab Results  Component Value Date   LEVETIRACETA 50.8 (H) 07/28/2023    CT Head without contrast(Personally reviewed): ***  CT angio Head and Neck with contrast(Personally reviewed): ***  MR Angio head without contrast and Carotid Duplex BL(Personally reviewed): ***  MRI Brain(Personally reviewed): ***  Neurodiagnostics rEEG:  ***  ASSESSMENT   Labarron Mussie Adger. is a 68 y.o. male ***  RECOMMENDATIONS  *** ______________________________________________________________________    Flint Hummer, MD [facility]

## 2023-12-09 NOTE — Plan of Care (Signed)
  Problem: Clinical Measurements: Goal: Will remain free from infection Outcome: Progressing Goal: Diagnostic test results will improve Outcome: Progressing Goal: Respiratory complications will improve Outcome: Progressing Goal: Cardiovascular complication will be avoided Outcome: Progressing   Problem: Nutrition: Goal: Adequate nutrition will be maintained Outcome: Progressing   Problem: Elimination: Goal: Will not experience complications related to urinary retention Outcome: Progressing   Problem: Pain Managment: Goal: General experience of comfort will improve and/or be controlled Outcome: Progressing   Problem: Safety: Goal: Ability to remain free from injury will improve Outcome: Progressing   Problem: Skin Integrity: Goal: Risk for impaired skin integrity will decrease Outcome: Progressing   Problem: Fluid Volume: Goal: Ability to maintain a balanced intake and output will improve Outcome: Progressing   Problem: Metabolic: Goal: Ability to maintain appropriate glucose levels will improve Outcome: Progressing   Problem: Nutritional: Goal: Maintenance of adequate nutrition will improve Outcome: Progressing   Problem: Skin Integrity: Goal: Risk for impaired skin integrity will decrease Outcome: Progressing   Problem: Tissue Perfusion: Goal: Adequacy of tissue perfusion will improve Outcome: Progressing

## 2023-12-09 NOTE — Progress Notes (Signed)
 LTM maint complete - no skin breakdown under:  T8,Fp1

## 2023-12-09 NOTE — Progress Notes (Signed)
 NAME:  Spiridon Gaudreault., MRN:  [id number], DOB:  [birth year], LOS: 1 ADMISSION DATE:  12/08/2023, CONSULTATION DATE:  5/15 REFERRING MD:  Dr. Drury Geralds, CHIEF COMPLAINT:  Status epilepticus   History of Present Illness:  68 year old male with past medical history as below, which is significant for dementia, diabetes, stroke with left-sided weakness, and recent diagnosis of seizure in December 2024.  He was admitted in January with status epilepticus requiring intubation in January of this year.  Course was complicated by aspiration pneumonia.  He was discharged on Keppra .  He resides in some sort of facility and has a legal guardian.  He presented to [facility] emergency department 5/15 after having seizure witnessed by his roommate.  He did wake up after the seizure but only briefly and then had another seizure.  Facility reports he has been compliant with Keppra .  Upon EMS arrival he had been seizing for approximately 25 minutes and was given IM Versed  in the field without improvement.  Upon arrival to [facility] he had active seizure.  8 total milligrams of IV Ativan  were given without improvement.  He was intubated for airway protection.  Generalized seizure improved after intubation with continuous propofol  infusion but twitches were still observed.  The patient was transferred to [facility] for continuous EEG monitoring and neurology evaluation.  Pertinent  Medical History   has a past medical history of Dementia (HCC), Diabetes mellitus without complication (HCC), Hypertension, and Stroke (HCC).   Significant Hospital Events: Including procedures, antibiotic start and stop dates in addition to other pertinent events   5/15 admitted for status epilepticus. Intubated.   Interim History / Subjective:  No overnight issues. Deeply sedated  on fentanyl  and propofol . EEG no seizures. Needing PRN labetalol  for SBP>200  Objective    Blood pressure 101/72, pulse 90, temperature 99.5 F (37.5 C),  resp. rate 14, height 5\' 9"  (1.753 m), weight 61.6 kg, SpO2 100%.    Vent Mode: PRVC FiO2 (%):  [40 %] 40 % Set Rate:  [14 bmp-18 bmp] 14 bmp Vt Set:  [570 mL-600 mL] 570 mL PEEP:  [5 cmH20] 5 cmH20 Plateau Pressure:  [14 cmH20-20 cmH20] 18 cmH20   Intake/Output Summary (Last 24 hours) at 12/09/2023 0945 Last data filed at 12/09/2023 0600 Gross per 24 hour  Intake 1733.82 ml  Output 1142 ml  Net 591.82 ml   Filed Weights   12/08/23 1958 12/08/23 2300 12/09/23 0500  Weight: 67.5 kg 60.4 kg 61.6 kg    Examination: Gen:      Intubated, sedated, acutely ill appearing HEENT:  ETT to vent Lungs:    sounds of mechanical ventilation auscultated no wheeze CV:         tachycardic, regular Abd:      + bowel sounds; soft, non-tender; no palpable masses, no distension Ext:    No edem, LUE contractures Skin:      Warm and dry; no rashes Neuro:   sedated, RASS -3, left hemiparesis, downgoing babinski reflex on right, +cough and gag   Resolved problem list   Assessment and Plan   Status epilepticus: with history of seizure. Admitted in January with same. CT head non-acute. - Appreciate neurology - AEDs per neuro - EEG pending  Acute hypoxemic respiratory failure Intubated for encephalopathy secondary to above - Full vent support - ABG pending - CXR reviewed - ETT is now in the right Mainstem bronchus, will retract - VAP bundle - will start SAT/SBT process today based  on neuro recs, no ongoing seizures  Hypertension - resume home metoprolol  - PRN labetalol  to keep SBP < 170 mmHg  DM2 - CBG monitoring and SSI  Leukocytosis: likely reactive to seizure - abx initially started. Will d/c given no clear infectious etiology.   Dementia Prior stroke with L sided deficits - Supportive care - hold baclofen  for now - ASA/plavix  when taking PO  Hypoglycemia - start D5 until can tolerate po or starting tube feeds.  Best Practice (right click and "Reselect all SmartList Selections"  daily)   Diet/type: NPO DVT prophylaxis LMWH Pressure ulcer(s): pressure ulcer assessment deferred  GI prophylaxis: H2B Lines: N/A Foley:  N/A Code Status:  full code Last date of multidisciplinary goals of care discussion [ ]   Labs   CBC: Recent Labs  Lab 12/08/23 1904 12/08/23 1908 12/08/23 2335 12/08/23 2348 12/09/23 0500  WBC 19.5*  --   --   --  15.2*  NEUTROABS 15.1*  --   --   --   --   HGB 13.6 14.3 13.3 12.9* 12.3*  HCT 40.5 42.0 39.0 38.0* 37.2*  MCV 89.0  --   --   --  88.4  PLT 286  --   --   --  260    Basic Metabolic Panel: Recent Labs  Lab 12/08/23 1904 12/08/23 1908 12/08/23 2335 12/08/23 2348 12/09/23 0500  NA 139 142 140 140 139  K 3.8 3.9 3.3* 3.7 3.6  CL 104 106  --   --  108  CO2 18*  --   --   --  22  GLUCOSE 140* 138*  --   --  106*  BUN 15 13  --   --  13  CREATININE 1.06 1.00  --   --  1.01  CALCIUM  9.0  --   --   --  8.4*  MG 1.7  --   --   --  1.5*  PHOS  --   --   --   --  3.3   GFR: Estimated Creatinine Clearance: 61.8 mL/min (by C-G formula based on SCr of 1.01 mg/dL). Recent Labs  Lab 12/08/23 1904 12/09/23 0500  WBC 19.5* 15.2*    Liver Function Tests: Recent Labs  Lab 12/08/23 1904  AST 27  ALT 22  ALKPHOS 92  BILITOT 0.7  PROT 7.6  ALBUMIN 3.8   No results for input(s): "LIPASE", "AMYLASE" in the last 168 hours. No results for input(s): "AMMONIA" in the last 168 hours.  ABG    Component Value Date/Time   PHART 7.395 12/08/2023 2348   PCO2ART 37.9 12/08/2023 2348   PO2ART 89 12/08/2023 2348   HCO3 23.1 12/08/2023 2348   TCO2 24 12/08/2023 2348   ACIDBASEDEF 1.0 12/08/2023 2348   O2SAT 97 12/08/2023 2348     Coagulation Profile: No results for input(s): "INR", "PROTIME" in the last 168 hours.  Cardiac Enzymes: No results for input(s): "CKTOTAL", "CKMB", "CKMBINDEX", "TROPONINI" in the last 168 hours.  HbA1C: Hgb A1c MFr Bld  Date/Time Value Ref Range Status  07/01/2023 12:36 PM 5.4 4.8 - 5.6 %  Final    Comment:    (NOTE) Pre diabetes:          5.7%-6.4%  Diabetes:              >6.4%  Glycemic control for   <7.0% adults with diabetes   10/03/2022 03:10 AM 5.6 4.8 - 5.6 % Final    Comment:    (  NOTE)         Prediabetes: 5.7 - 6.4         Diabetes: >6.4         Glycemic control for adults with diabetes: <7.0     CBG: Recent Labs  Lab 12/08/23 1855 12/08/23 2303 12/09/23 0358 12/09/23 0740 12/09/23 0835  GLUCAP 141* 102* 77 56* 87     Critical care time:     The patient is critically ill due to encephalopathy, respiratory failure.  Critical care was necessary to treat or prevent imminent or life-threatening deterioration.  Critical care was time spent personally by me on the following activities: development of treatment plan with patient and/or surrogate as well as nursing, discussions with consultants, evaluation of patient's response to treatment, examination of patient, obtaining history from patient or surrogate, ordering and performing treatments and interventions, ordering and review of laboratory studies, ordering and review of radiographic studies, pulse oximetry, re-evaluation of patient's condition and participation in multidisciplinary rounds.   Critical Care Time devoted to patient care services described in this note is 38 minutes. This time reflects time of care of this signee Sheilah Rayos S Belen Zwahlen . This critical care time does not reflect separately billable procedures or procedure time, teaching time or supervisory time of PA/NP/Med student/Med Resident etc but could involve care discussion time.       Aleck Hurdle [facility] 12/09/2023 9:58 AM  Pager: see AMION  If no response to pager , please call critical care on call (see AMION) until 7pm After 7:00 pm call Elink

## 2023-12-09 NOTE — Progress Notes (Signed)
 [facility] ADULT ICU REPLACEMENT PROTOCOL   The patient does apply for the [facility] Adult ICU Electrolyte Replacment Protocol based on the criteria listed below:   1.Exclusion criteria: TCTS, ECMO, Dialysis, and Myasthenia Gravis patients 2. Is GFR >/= 30 ml/min? Yes.    Patient's GFR today is >60 3. Is SCr </= 2? Yes.   Patient's SCr is 1.01 mg/dL 4. Did SCr increase >/= 0.5 in 24 hours? No. 5.Pt's weight >40kg  Yes.   6. Abnormal electrolyte(s): potassium, magnesium   7. Electrolytes replaced per protocol 8.  Call MD STAT for K+ </= 2.5, Phos </= 1, or Mag </= 1 Physician:  Dr. Ogan  Jacob Mcgrath 12/09/2023 7:01 AM

## 2023-12-09 NOTE — Plan of Care (Signed)
 Seen and examined on rounds-same-day progress note-see full consult from Dr. Alecia Ames from earlier this morning  Upon holding sedation, patient was able to localize some with his right upper extremity and have some movement in his toes to noxious simulation on the right.  His left side seems to have increased tone-likely baseline hemiplegia.  Overnight EEG with no evidence of seizures-revealed generalized encephalopathy.  At this time recommendations would be continue Keppra  and Depakote, and for now continue the EEG.   Minimize patient as tolerated.  Extubation per primary team  Will continue to follow Plan discussed with Jacob Hence, NP   -- Jacob Francis, MD Neurologist [facility]

## 2023-12-09 NOTE — Consult Note (Signed)
 NEUROLOGY CONSULT NOTE   Date of service: Dec 09, 2023 Patient Name: Jacob Mcgrath. MRN:  [id number] DOB:  Dec 12, 1955 Chief Complaint: "Status epilepticus" Requesting Provider: Claven Cumming, MD  History of Present Illness  Jacob Mcgrath. is a 68 y.o. male with hx of dementia, diabetes, hypertension, previous stroke, seizures who presented to the [facility] emergency department with status epilepticus.  He takes Keppra  750 twice daily at home.  He was given 8 mg of Ativan , as well as 4 g of Keppra  with no improvement.  He was therefore intubated and given propofol  as well as being loaded with Depacon.  Following initiation of the propofol , he appeared to have cessation of movements but no improvement in his mental status and therefore he has been transferred to [facility] for emergency evaluation of status epilepticus.  Past History   Past Medical History:  Diagnosis Date   Dementia (HCC)    Diabetes mellitus without complication (HCC)    Hypertension    Stroke [facility])     Past Surgical History:  Procedure Laterality Date   ABDOMINAL AORTOGRAM W/LOWER EXTREMITY N/A 10/04/2022   Procedure: ABDOMINAL AORTOGRAM W/LOWER EXTREMITY;  Surgeon: Dannis Dy, MD;  Location: [facility] INVASIVE CV LAB;  Service: Cardiovascular;  Laterality: N/A;   ABDOMINAL AORTOGRAM W/LOWER EXTREMITY N/A 10/08/2022   Procedure: ABDOMINAL AORTOGRAM W/LOWER EXTREMITY;  Surgeon: Carlene Che, MD;  Location: [facility] INVASIVE CV LAB;  Service: Cardiovascular;  Laterality: N/A;   PERIPHERAL VASCULAR INTERVENTION  10/04/2022   Procedure: PERIPHERAL VASCULAR INTERVENTION;  Surgeon: Dannis Dy, MD;  Location: [facility] INVASIVE CV LAB;  Service: Cardiovascular;;   PERIPHERAL VASCULAR INTERVENTION Left 10/08/2022   Procedure: PERIPHERAL VASCULAR INTERVENTION;  Surgeon: Carlene Che, MD;  Location: [facility] INVASIVE CV LAB;  Service: Cardiovascular;  Laterality: Left;   TONSILLECTOMY      Family  History: History reviewed. No pertinent family history.  Social History  reports that he has been smoking cigarettes. He does not have any smokeless tobacco history on file. He reports that he does not currently use alcohol. He reports that he does not currently use drugs.  Allergies  Allergen Reactions   Amoxicillin Other (See Comments)    Unknown reaction per facility   Ampicillin Other (See Comments)    Unknown reaction per facility   Tetracyclines & Related Rash    Medications   Current Facility-Administered Medications:    ceFEPIme (MAXIPIME) 2 g in sodium chloride  0.9 % 100 mL IVPB, 2 g, Intravenous, Q8H, Merdis Stalling, MD, Last Rate: 200 mL/hr at 12/08/23 2324, 2 g at 12/08/23 2324   docusate (COLACE) 50 MG/5ML liquid 100 mg, 100 mg, Per Tube, BID PRN, Lemmie Pyo, NP   enoxaparin  (LOVENOX ) injection 40 mg, 40 mg, Subcutaneous, Daily, Lemmie Pyo, NP   famotidine  (PEPCID ) tablet 20 mg, 20 mg, Per Tube, BID, Lemmie Pyo, NP   fentaNYL  (SUBLIMAZE ) bolus via infusion 25-100 mcg, 25-100 mcg, Intravenous, Q15 min PRN, Lemmie Pyo, NP   fentaNYL  in NS 250mL (38mcg/ml) infusion-PREMIX, 0-400 mcg/hr, Intravenous, Continuous, Merdis Stalling, MD, Last Rate: 2.5 mL/hr at 12/08/23 2049, 25 mcg/hr at 12/08/23 2049   insulin  aspart (novoLOG ) injection 0-15 Units, 0-15 Units, Subcutaneous, Q4H, Lemmie Pyo, NP   labetalol  (NORMODYNE ) injection 10 mg, 10 mg, Intravenous, Q10 min PRN, Lemmie Pyo, NP   LORazepam  (ATIVAN ) injection 2 mg, 2 mg, Intravenous, Q5 Min x 3 PRN, Lemmie Pyo, NP  metroNIDAZOLE  (FLAGYL ) IVPB 500 mg, 500 mg, Intravenous, Q12H, Merdis Stalling, MD, Last Rate: 100 mL/hr at 2023/12/29 2340, 500 mg at 29-Dec-2023 2340   polyethylene glycol (MIRALAX  / GLYCOLAX ) packet 17 g, 17 g, Per Tube, Daily PRN, Lemmie Pyo, NP   propofol  (DIPRIVAN ) 1000 MG/100ML infusion, 5-80 mcg/kg/min, Intravenous, Continuous, Scotty Cyphers, RPH, Last Rate: 8.1  mL/hr at 12/29/2023 2158, 20 mcg/kg/min at 2023-12-29 2158   vancomycin  (VANCOREADY) IVPB 1500 mg/300 mL, 1,500 mg, Intravenous, Once, Scotty Cyphers, RPH   vancomycin  (VANCOREADY) IVPB 1500 mg/300 mL, 1,500 mg, Intravenous, Q24H, Scotty Cyphers, RPH  Vitals   Vitals:   12-29-2023 2130 12/29/2023 2145 12-29-2023 2200 2023-12-29 2300  BP: (!) 182/95 130/86 137/87   Pulse: (!) 115 (!) 105  (!) 115  Resp: 18 18 18 14   Temp: 99.5 F (37.5 C) 99.6 F (37.6 C) 99.3 F (37.4 C)   SpO2: 100% 100%  100%  Weight:      Height:        Body mass index is 21.97 kg/m.  Physical Exam   Constitutional: Appears well-developed and well-nourished.   Neurologic Examination   Examined on propofol  30 mcg/kg/min and fentanyl  2mcg/hr Mental status: Does not open eyes or follow commands Cranial nerves: Pupils are reactive bilaterally, corneals are intact, cough is intact Motor: He has increased tone in the left upper extremity with some contracture, to noxious stimulation on the left I do see him move both the left side as well as the right, no significant response to noxious stimulation on the right Sensory: As above  Labs/Imaging/Neurodiagnostic studies   CBC:  Recent Labs  Lab 12/29/23 1904 12-29-2023 1908 2023-12-29 2335 2023/12/29 2348  WBC 19.5*  --   --   --   NEUTROABS 15.1*  --   --   --   HGB 13.6   < > 13.3 12.9*  HCT 40.5   < > 39.0 38.0*  MCV 89.0  --   --   --   PLT 286  --   --   --    < > = values in this interval not displayed.   Basic Metabolic Panel:  Lab Results  Component Value Date   NA 140 29-Dec-2023   K 3.7 December 29, 2023   CO2 18 (L) 2023-12-29   GLUCOSE 138 (H) 2023-12-29   BUN 13 12/29/2023   CREATININE 1.00 2023/12/29   CALCIUM  9.0 29-Dec-2023   GFRNONAA >60 2023/12/29   Lipid Panel:  Lab Results  Component Value Date   LDLCALC 40 10/05/2022   HgbA1c:  Lab Results  Component Value Date   HGBA1C 5.4 07/01/2023   Urine Drug Screen:     Component Value  Date/Time   LABOPIA NONE DETECTED 07/01/2023 1127   COCAINSCRNUR NONE DETECTED 07/01/2023 1127   LABBENZ NONE DETECTED 07/01/2023 1127   AMPHETMU NONE DETECTED 07/01/2023 1127   THCU NONE DETECTED 07/01/2023 1127   LABBARB NONE DETECTED 07/01/2023 1127    Alcohol Level     Component Value Date/Time   ETH <10 07/01/2023 1236   INR  Lab Results  Component Value Date   INR 1.3 (H) 07/02/2023   APTT  Lab Results  Component Value Date   APTT 32 07/02/2023   AED levels:  Lab Results  Component Value Date   LEVETIRACETA 50.8 (H) 07/28/2023    CT Head without contrast(Personally reviewed): 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 is a  68 y.o. male with a history of previous stroke and subsequent seizures who presented with refractory status epilepticus.  He does appear to have stopped seizing at this point, but we will need emergent EEG to rule out ongoing seizure activity that is no longer manifesting clinically.  No definite etiology has yet been identified for his breakthrough seizure, and we will need to continue to monitor.  RECOMMENDATIONS  Continue Keppra  1500 mg twice daily Continue Depacon 500 mg 3 times daily Overnight EEG Further titration of medications will be undertaken if indicated based on EEG ______________________________________________________________________  This patient is critically ill and at significant risk of neurological worsening, death and care requires constant monitoring of vital signs, hemodynamics,respiratory and cardiac monitoring, neurological assessment, discussion with family, other specialists and medical decision making of high complexity. I spent 50 minutes of neurocritical care time  in the care of  this patient. This was time spent independent of any time provided by nurse practitioner or PA.  Ann Keto, MD [facility]   If 7pm- 7am, please page neurology on call as listed in  AMION. 12/09/2023  12:17 AM

## 2023-12-09 NOTE — Progress Notes (Signed)
 LTM EEG hooked up and running - no initial skin breakdown - push button tested - [facility] monitoring.

## 2023-12-09 NOTE — Procedures (Addendum)
 Patient Name: Jacob Mcgrath.  MRN: [id number]  Epilepsy Attending: Arleene Lack  Referring Physician/Provider: Augustin Leber, MD  Duration: 12/09/2023 0123 to 12/10/2023 0123  Patient history:  68 y.o. male with a history of previous stroke and subsequent seizures who presented with refractory status epilepticus.  EEG to evaluate for seizure.  Level of alertness:  comatose  AEDs during EEG study: LEV, VPA, Propofol   Technical aspects: This EEG study was done with scalp electrodes positioned according to the 10-20 International system of electrode placement. Electrical activity was reviewed with band pass filter of 1-70Hz , sensitivity of 7 uV/mm, display speed of 52mm/sec with a 60Hz  notched filter applied as appropriate. EEG data were recorded continuously and digitally stored.  Video monitoring was available and reviewed as appropriate.  Description: EEG initially showed continuous generalized and lateralized right hemisphere 3 to 6 Hz theta-delta slowing admixed with 13-15hz  generalized beta activity. Hyperventilation and photic stimulation were not performed.    Event button was pressed on 12/09/2023 at 1755 for right arm tremor like movements. Concomitant eeg before, during and after he event didn't show any eeg change to suggest seizure.   ABNORMALITY - Continuous slow, generalized and lateralized right hemisphere  IMPRESSION: This study is suggestive of cortical dysfunction in right hemisphere likely secondary to underlying stroke, post-ictal state. Additionally there was severe diffuse encephalopathy, nonspecific etiology but likely related to sedation. No seizures or epileptiform discharges were seen throughout the recording.  Event button was pressed on 12/09/2023 at 1755 for right arm tremor like movements without concomitant eeg change. This was most likely a NON epileptic event.  Myria Steenbergen O Ifeoluwa Beller

## 2023-12-09 NOTE — Progress Notes (Signed)
 Initial Nutrition Assessment  DOCUMENTATION CODES:   Non-severe (moderate) malnutrition in context of social or environmental circumstances  INTERVENTION:  If not extubated, recommend the following via OGT: Osmolite 1.5 at 45 ml/h (1080 ml per day) Start at 15mL and advance by 10mL q8h to goal  Prosource TF20 60 ml 1x/d Provides 1700 kcal, 88 gm protein, 823 ml free water daily Monitor magnesium  and phosphorus every 12 hours x 4 occurrences, MD to replete as needed, as pt is at risk for refeeding syndrome given malnutrition. Thiamine  100mg  x 5 days   NUTRITION DIAGNOSIS:  Moderate Malnutrition related to social / environmental circumstances (dementia, communal living) as evidenced by mild muscle depletion, moderate fat depletion, mild fat depletion, moderate muscle depletion.  GOAL:  Patient will meet greater than or equal to 90% of their needs  MONITOR:  TF tolerance, I & O's, Vent status, Labs  REASON FOR ASSESSMENT:  Ventilator    ASSESSMENT:  Pt with hx of dementia, DM type 2, HTN, and hx CVA presented to ED from facility after witnessed seizures.  5/15 - presented to ED from facility, intubated   Patient is currently intubated on ventilator support. No family present to provide a hx. Pt currently undergoing EEG. RN reports that plan is to attempt extubation this afternoon. MD ok to start feeds if pt remains on ventilator.   Deficits noted throughout body. Poor muscle tone on the left side, consistent with previous deficits from CVA.   MV: 8.2 L/min Temp (24hrs), Avg:99.3 F (37.4 C), Min:98.1 F (36.7 C), Max:99.9 F (37.7 C) MAP (cuff):  Propofol : 12.15 ml/hr (321 kcal/d)  Admit weight: 67.5 kg   Current weight: 61.6 kg   Intake/Output Summary (Last 24 hours) at 12/09/2023 1522 Last data filed at 12/09/2023 1500 Gross per 24 hour  Intake 2291.5 ml  Output 2002 ml  Net 289.5 ml  Net IO Since Admission: 289.5 mL [12/09/23 1522]  Drains/Lines: CVC  Triple Lumen Left Femoral OGT 16 Fr. UOP x 24 hours  Nutritionally Relevant Medications: Scheduled Meds:  baclofen   5 mg Per Tube TID   famotidine   20 mg Per Tube BID   insulin  aspart  0-15 Units Subcutaneous Q4H   Continuous Infusions:  dextrose  75 mL/hr at 12/09/23 9629   fentaNYL  infusion INTRAVENOUS 25 mcg/hr (12/09/23 0600)   propofol  (DIPRIVAN ) infusion 30 mcg/kg/min (12/09/23 0600)   PRN Meds: docusate, polyethylene glycol  Labs Reviewed: Magnesium  1.5 CBG ranges from 56-141 mg/dL over the last 24 hours HgbA1c 5.4% (12/6)  NUTRITION - FOCUSED PHYSICAL EXAM: Flowsheet Row Most Recent Value  Orbital Region Mild depletion  Upper Arm Region Moderate depletion  Thoracic and Lumbar Region Moderate depletion  Buccal Region Moderate depletion  Temple Region Mild depletion  Clavicle Bone Region Mild depletion  Clavicle and Acromion Bone Region Mild depletion  Scapular Bone Region Moderate depletion  Dorsal Hand No depletion  Patellar Region Severe depletion  Anterior Thigh Region Severe depletion  Posterior Calf Region Severe depletion  Edema (RD Assessment) None  Hair Reviewed  Eyes Reviewed  Mouth Reviewed  Skin Reviewed  Nails Reviewed    Diet Order:   Diet Order             Diet NPO time specified  Diet effective now                   EDUCATION NEEDS:  Not appropriate for education at this time  Skin:  Skin Assessment: Reviewed RN Assessment  Last BM:  unsure  Height:  Ht Readings from Last 1 Encounters:  12/08/23 5\' 9"  (1.753 m)    Weight:  Wt Readings from Last 1 Encounters:  12/09/23 61.6 kg    Ideal Body Weight:  72.7 kg  BMI:  Body mass index is 20.05 kg/m.  Estimated Nutritional Needs:  Kcal:  1700-1900 kcal/d Protein:  85-100 g/d Fluid:  1.8L/d    Edwena Graham, RD, LDN Registered Dietitian II Please reach out via secure chat

## 2023-12-10 ENCOUNTER — Inpatient Hospital Stay (HOSPITAL_COMMUNITY)

## 2023-12-10 DIAGNOSIS — D72829 Elevated white blood cell count, unspecified: Secondary | ICD-10-CM | POA: Diagnosis not present

## 2023-12-10 DIAGNOSIS — E440 Moderate protein-calorie malnutrition: Secondary | ICD-10-CM | POA: Insufficient documentation

## 2023-12-10 DIAGNOSIS — E119 Type 2 diabetes mellitus without complications: Secondary | ICD-10-CM | POA: Diagnosis not present

## 2023-12-10 DIAGNOSIS — G9340 Encephalopathy, unspecified: Secondary | ICD-10-CM

## 2023-12-10 DIAGNOSIS — J9601 Acute respiratory failure with hypoxia: Secondary | ICD-10-CM | POA: Diagnosis not present

## 2023-12-10 DIAGNOSIS — G40909 Epilepsy, unspecified, not intractable, without status epilepticus: Secondary | ICD-10-CM | POA: Diagnosis not present

## 2023-12-10 DIAGNOSIS — G40901 Epilepsy, unspecified, not intractable, with status epilepticus: Secondary | ICD-10-CM | POA: Diagnosis not present

## 2023-12-10 LAB — GLUCOSE, CAPILLARY
Glucose-Capillary: 134 mg/dL — ABNORMAL HIGH (ref 70–99)
Glucose-Capillary: 140 mg/dL — ABNORMAL HIGH (ref 70–99)
Glucose-Capillary: 140 mg/dL — ABNORMAL HIGH (ref 70–99)
Glucose-Capillary: 154 mg/dL — ABNORMAL HIGH (ref 70–99)
Glucose-Capillary: 157 mg/dL — ABNORMAL HIGH (ref 70–99)
Glucose-Capillary: 161 mg/dL — ABNORMAL HIGH (ref 70–99)
Glucose-Capillary: 168 mg/dL — ABNORMAL HIGH (ref 70–99)

## 2023-12-10 LAB — BASIC METABOLIC PANEL WITH GFR
Anion gap: 8 (ref 5–15)
BUN: 13 mg/dL (ref 8–23)
CO2: 22 mmol/L (ref 22–32)
Calcium: 8.7 mg/dL — ABNORMAL LOW (ref 8.9–10.3)
Chloride: 107 mmol/L (ref 98–111)
Creatinine, Ser: 0.98 mg/dL (ref 0.61–1.24)
GFR, Estimated: >60 mL/min (ref >60)
Glucose, Bld: 194 mg/dL — ABNORMAL HIGH (ref 70–99)
Potassium: 3.6 mmol/L (ref 3.5–5.1)
Sodium: 137 mmol/L (ref 135–145)

## 2023-12-10 LAB — CBC
HCT: 38 % — ABNORMAL LOW (ref 39.0–52.0)
Hemoglobin: 12.5 g/dL — ABNORMAL LOW (ref 13.0–17.0)
MCH: 28.9 pg (ref 26.0–34.0)
MCHC: 32.9 g/dL (ref 30.0–36.0)
MCV: 88 fL (ref 80.0–100.0)
Platelets: 264 10*3/uL (ref 150–400)
RBC: 4.32 MIL/uL (ref 4.22–5.81)
RDW: 13.5 % (ref 11.5–15.5)
WBC: 13 10*3/uL — ABNORMAL HIGH (ref 4.0–10.5)
nRBC: 0 % (ref 0.0–0.2)

## 2023-12-10 LAB — MAGNESIUM: Magnesium: 2 mg/dL (ref 1.7–2.4)

## 2023-12-10 LAB — PHOSPHORUS: Phosphorus: 2.6 mg/dL (ref 2.5–4.6)

## 2023-12-10 NOTE — Procedures (Signed)
 Patient Name: Jacob Mcgrath.  MRN: [id number]  Epilepsy Attending: Arleene Lack  Referring Physician/Provider: Augustin Leber, MD  Duration: 12/10/2023 0123 to 12/11/2023 0123   Patient history:  68 y.o. male with a history of previous stroke and subsequent seizures who presented with refractory status epilepticus.  EEG to evaluate for seizure.   Level of alertness: lethargic   AEDs during EEG study: LEV, VPA   Technical aspects: This EEG study was done with scalp electrodes positioned according to the 10-20 International system of electrode placement. Electrical activity was reviewed with band pass filter of 1-70Hz , sensitivity of 7 uV/mm, display speed of 33mm/sec with a 60Hz  notched filter applied as appropriate. EEG data were recorded continuously and digitally stored. Video monitoring was available and reviewed as appropriate.   Description: The posterior dominant rhythm consists of 8 Hz activity of moderate voltage (25-35 uV) seen predominantly in posterior head regions, symmetric and reactive to eye opening and eye closing. Sleep was characterized by sleep spindles (12-14hz ), maximal fronto-central region. EEG showed continuous generalized and lateralized right hemisphere predominantly 5 to 7 Hz theta slowing admixed with intermittent 2-3hz  delta slowing, at times with triphasic morphology. Hyperventilation and photic stimulation were not performed.      ABNORMALITY - Continuous slow, generalized and lateralized right hemisphere   IMPRESSION: This study is suggestive of cortical dysfunction in right hemisphere likely secondary to underlying stroke, post-ictal state. Additionally there was moderate diffuse encephalopathy. No seizures or epileptiform discharges were seen throughout the recording.   Alaila Pillard O Arnel Wymer

## 2023-12-10 NOTE — Progress Notes (Signed)
 LTM maint complete - no skin breakdown under:  f8 fp1 f3

## 2023-12-10 NOTE — Progress Notes (Signed)
 Pt more alert with eyes open spontaneously and tracks staff visually around room with head and eyes.   Purposeful  movement of RUE resisting oral care. Pt resists bats/swings at staff providing oral care and suctioning of copious oral secretions.  No family or caregiver visits/calls today.

## 2023-12-10 NOTE — Plan of Care (Signed)
  Problem: Clinical Measurements: Goal: Ability to maintain clinical measurements within normal limits will improve Outcome: Progressing Goal: Will remain free from infection Outcome: Progressing Goal: Diagnostic test results will improve Outcome: Progressing Goal: Cardiovascular complication will be avoided Outcome: Progressing   Problem: Nutrition: Goal: Adequate nutrition will be maintained Outcome: Progressing   Problem: Elimination: Goal: Will not experience complications related to bowel motility Outcome: Progressing Goal: Will not experience complications related to urinary retention Outcome: Progressing   Problem: Pain Managment: Goal: General experience of comfort will improve and/or be controlled Outcome: Progressing   Problem: Safety: Goal: Ability to remain free from injury will improve Outcome: Progressing   Problem: Skin Integrity: Goal: Risk for impaired skin integrity will decrease Outcome: Progressing   Problem: Education: Goal: Ability to describe self-care measures that may prevent or decrease complications (Diabetes Survival Skills Education) will improve Outcome: Progressing Goal: Individualized Educational Video(s) Outcome: Progressing   Problem: Coping: Goal: Ability to adjust to condition or change in health will improve Outcome: Progressing   Problem: Fluid Volume: Goal: Ability to maintain a balanced intake and output will improve Outcome: Progressing   Problem: Metabolic: Goal: Ability to maintain appropriate glucose levels will improve Outcome: Progressing   Problem: Nutritional: Goal: Maintenance of adequate nutrition will improve Outcome: Progressing Goal: Progress toward achieving an optimal weight will improve Outcome: Progressing   Problem: Skin Integrity: Goal: Risk for impaired skin integrity will decrease Outcome: Progressing   Problem: Tissue Perfusion: Goal: Adequacy of tissue perfusion will improve Outcome:  Progressing

## 2023-12-10 NOTE — Progress Notes (Signed)
LTM maint complete - no skin breakdown under:  Fp1 F3 [facility] monitored, Event button test confirmed by [facility].  

## 2023-12-10 NOTE — Progress Notes (Signed)
 NAME:  Jacob Branton., MRN:  [id number], DOB:  1955-08-26, LOS: 2 ADMISSION DATE:  12/08/2023, CONSULTATION DATE:  5/15 REFERRING MD:  Dr. Drury Geralds, CHIEF COMPLAINT:  Status epilepticus   History of Present Illness:  68 year old male with past medical history as below, which is significant for dementia, diabetes, stroke with left-sided weakness, and recent diagnosis of seizure in December 2024.  He was admitted in January with status epilepticus requiring intubation in January of this year.  Course was complicated by aspiration pneumonia.  He was discharged on Keppra .  He resides in some sort of facility and has a legal guardian.  He presented to [facility] emergency department 5/15 after having seizure witnessed by his roommate.  He did wake up after the seizure but only briefly and then had another seizure.  Facility reports he has been compliant with Keppra .  Upon EMS arrival he had been seizing for approximately 25 minutes and was given IM Versed  in the field without improvement.  Upon arrival to [facility] he had active seizure.  8 total milligrams of IV Ativan  were given without improvement.  He was intubated for airway protection.  Generalized seizure improved after intubation with continuous propofol  infusion but twitches were still observed.  The patient was transferred to [facility] for continuous EEG monitoring and neurology evaluation.  Pertinent  Medical History   has a past medical history of Dementia (HCC), Diabetes mellitus without complication (HCC), Hypertension, and Stroke (HCC).   Significant Hospital Events: Including procedures, antibiotic start and stop dates in addition to other pertinent events   5/15 admitted for status epilepticus. Intubated.   Interim History / Subjective:  No seizures overnight. Right hemispheric cortical dysfunction secondary to previous right MCA stroke. Off propofol  x24 hours. Failed SBT for apnea.   Objective    Blood pressure (!) 143/85, pulse  94, temperature 99.5 F (37.5 C), resp. rate 14, height 5\' 9"  (1.753 m), weight 60.5 kg, SpO2 100%.    Vent Mode: PRVC FiO2 (%):  [40 %] 40 % Set Rate:  [14 bmp] 14 bmp Vt Set:  [470 mL-570 mL] 570 mL PEEP:  [5 cmH20] 5 cmH20 Plateau Pressure:  [16 cmH20-18 cmH20] 18 cmH20   Intake/Output Summary (Last 24 hours) at 12/10/2023 0803 Last data filed at 12/10/2023 0700 Gross per 24 hour  Intake 1752.11 ml  Output 2295 ml  Net -542.89 ml   Filed Weights   12/08/23 2300 12/09/23 0500 12/10/23 0200  Weight: 60.4 kg 61.6 kg 60.5 kg    Examination: Gen:      Intubated, sedated, acutely ill appearing HEENT:  ETT to vent Lungs:    sounds of mechanical ventilation auscultated no wheeze, thick secretions CV:         RRR Abd:      + bowel sounds; soft, non-tender; no palpable masses, no distension Ext:    No edema Skin:      Warm and dry; no rashes Neuro:   RASS -2, withdraws to pain in right lower extremity, +cough and gag   Resolved problem list   Assessment and Plan   Status epilepticus: with history of seizure. Admitted in January with same. CT head non-acute. - Appreciate neurology - AEDs per neuro - on keppra  and depakote - continue LTM EEG - suspect he will need more time to arouse from post-ictal state.  Acute hypoxemic respiratory failure Intubated for encephalopathy secondary to above - lung protective ventilation - VAP bundle - continue attempts for  SAT/SBT and extubation.  Hypertension - resume home metoprolol  - PRN labetalol  to keep SBP < 170 mmHg  DM2 - CBG monitoring and SSI  Dementia Prior stroke with L sided deficits - Supportive care - resumed home baclofen  to prevent withdrawal - ASA/plavix  enterally  Hypoglycemia - improved once tube feeds started  Best Practice (right click and "Reselect all SmartList Selections" daily)   Diet/type: tubefeeds DVT prophylaxis LMWH Pressure ulcer(s): pressure ulcer assessment deferred  GI prophylaxis:  H2B Lines: N/A Foley:  N/A Code Status:  full code Last date of multidisciplinary goals of care discussion [ ]   Labs   CBC: Recent Labs  Lab 12/08/23 1904 12/08/23 1908 12/08/23 2335 12/08/23 2348 12/09/23 0500 12/10/23 0533  WBC 19.5*  --   --   --  15.2* 13.0*  NEUTROABS 15.1*  --   --   --   --   --   HGB 13.6 14.3 13.3 12.9* 12.3* 12.5*  HCT 40.5 42.0 39.0 38.0* 37.2* 38.0*  MCV 89.0  --   --   --  88.4 88.0  PLT 286  --   --   --  260 264    Basic Metabolic Panel: Recent Labs  Lab 12/08/23 1904 12/08/23 1908 12/08/23 2335 12/08/23 2348 12/09/23 0500 12/10/23 0533  NA 139 142 140 140 139 137  K 3.8 3.9 3.3* 3.7 3.6 3.6  CL 104 106  --   --  108 107  CO2 18*  --   --   --  22 22  GLUCOSE 140* 138*  --   --  106* 194*  BUN 15 13  --   --  13 13  CREATININE 1.06 1.00  --   --  1.01 0.98  CALCIUM  9.0  --   --   --  8.4* 8.7*  MG 1.7  --   --   --  1.5* 2.0  PHOS  --   --   --   --  3.3 2.6   GFR: Estimated Creatinine Clearance: 62.6 mL/min (by C-G formula based on SCr of 0.98 mg/dL). Recent Labs  Lab 12/08/23 1904 12/09/23 0500 12/09/23 1538 12/10/23 0533  PROCALCITON  --   --  0.23  --   WBC 19.5* 15.2*  --  13.0*    Liver Function Tests: Recent Labs  Lab 12/08/23 1904  AST 27  ALT 22  ALKPHOS 92  BILITOT 0.7  PROT 7.6  ALBUMIN 3.8   No results for input(s): "LIPASE", "AMYLASE" in the last 168 hours. Recent Labs  Lab 12/09/23 1538  AMMONIA 19    ABG    Component Value Date/Time   PHART 7.395 12/08/2023 2348   PCO2ART 37.9 12/08/2023 2348   PO2ART 89 12/08/2023 2348   HCO3 23.1 12/08/2023 2348   TCO2 24 12/08/2023 2348   ACIDBASEDEF 1.0 12/08/2023 2348   O2SAT 97 12/08/2023 2348     Coagulation Profile: No results for input(s): "INR", "PROTIME" in the last 168 hours.  Cardiac Enzymes: No results for input(s): "CKTOTAL", "CKMB", "CKMBINDEX", "TROPONINI" in the last 168 hours.  HbA1C: Hgb A1c MFr Bld  Date/Time Value Ref  Range Status  07/01/2023 12:36 PM 5.4 4.8 - 5.6 % Final    Comment:    (NOTE) Pre diabetes:          5.7%-6.4%  Diabetes:              >6.4%  Glycemic control for   <7.0% adults with diabetes  10/03/2022 03:10 AM 5.6 4.8 - 5.6 % Final    Comment:    (NOTE)         Prediabetes: 5.7 - 6.4         Diabetes: >6.4         Glycemic control for adults with diabetes: <7.0     CBG: Recent Labs  Lab 12/09/23 1131 12/09/23 1510 12/09/23 1944 12/10/23 0026 12/10/23 0350  GLUCAP 103* 127* 136* 140* 161*     Critical care time:     The patient is critically ill due to respiratory failure, status epilepticus.  Critical care was necessary to treat or prevent imminent or life-threatening deterioration.  Critical care was time spent personally by me on the following activities: development of treatment plan with patient and/or surrogate as well as nursing, discussions with consultants, evaluation of patient's response to treatment, examination of patient, obtaining history from patient or surrogate, ordering and performing treatments and interventions, ordering and review of laboratory studies, ordering and review of radiographic studies, pulse oximetry, re-evaluation of patient's condition and participation in multidisciplinary rounds.   Critical Care Time devoted to patient care services described in this note is 32 minutes. This time reflects time of care of this signee Jacob Mcgrath . This critical care time does not reflect separately billable procedures or procedure time, teaching time or supervisory time of PA/NP/Med student/Med Resident etc but could involve care discussion time.       Jacob Mcgrath Pulmonary and Critical Care Medicine 12/10/2023 8:03 AM  Pager: see AMION  If no response to pager , please call critical care on call (see AMION) until 7pm After 7:00 pm call Elink

## 2023-12-10 NOTE — Progress Notes (Signed)
 NEUROLOGY CONSULT FOLLOW UP NOTE   Date of service: Dec 10, 2023 Patient [name]. MRN:  [id number] DOB:  03-14-1956  Interval Hx/subjective  Seen and examined.  Has been off of sedation since yesterday-on some fentanyl  for vent dyssynchrony.  Vitals   Vitals:   12/10/23 0415 12/10/23 0500 12/10/23 0600 12/10/23 0700  BP: (!) 152/85 138/84 (!) 146/85 (!) 143/85  Pulse: 91 91 99 94  Resp: 14 14 14 14   Temp: 99.5 F (37.5 C) 99.5 F (37.5 C) 99.7 F (37.6 C) 99.5 F (37.5 C)  TempSrc:      SpO2: 100% 100% 100% 100%  Weight:      Height:         Body mass index is 19.7 kg/m.  Physical Exam  General: On minimal fentanyl , intubated HEENT: Normocephalic atraumatic Lungs: Vented Neurological exam Comfortably sleeping in bed on the vent Upon calling Jacob Mcgrath a couple of times, he does tend to move Jacob head towards examiner but does not open eyes. Actively resists eye opening Pupils are equal round reactive with no gaze deviation or preference. Difficult to assess extraocular movements giving the active resistance to eye opening. To noxious stimulation, has some withdrawal and attempted localization with right upper extremity and mild withdrawal to the right lower extremity but minimal response on the left. Still not following commands-was told by overnight RN report that he was following some commands but was slow to respond.  Medications  Current Facility-Administered Medications:    0.9 %  sodium chloride  infusion, , Intravenous, PRN, Desai, Nikita S, MD, Last Rate: 10 mL/hr at 12/10/23 0700, Infusion Verify at 12/10/23 0700   acetaminophen  (TYLENOL ) 160 MG/5ML solution 650 mg, 650 mg, Per Tube, Q4H PRN, Ogan, Okoronkwo U, MD   aspirin  chewable tablet 81 mg, 81 mg, Per Tube, Daily, Bowser, Darin Edinger, NP, 81 mg at 12/09/23 4098   baclofen  (LIORESAL ) tablet 5 mg, 5 mg, Per Tube, TID, Desai, Nikita S, MD, 5 mg at 12/09/23 2154   Chlorhexidine  Gluconate Cloth 2 %  PADS 6 each, 6 each, Topical, Daily, Claven Cumming, MD, 6 each at 12/09/23 2305   clopidogrel  (PLAVIX ) tablet 75 mg, 75 mg, Per Tube, Daily, Bowser, Darin Edinger, NP, 75 mg at 12/09/23 0936   dextrose  5 % solution, , Intravenous, Continuous, Aleck Hurdle, MD, Stopped at 12/09/23 1745   docusate (COLACE) 50 MG/5ML liquid 100 mg, 100 mg, Per Tube, BID PRN, Lemmie Pyo, NP   enoxaparin  (LOVENOX ) injection 40 mg, 40 mg, Subcutaneous, Daily, Lemmie Pyo, NP, 40 mg at 12/09/23 1191   famotidine  (PEPCID ) tablet 20 mg, 20 mg, Per Tube, BID, Hoffman, Paul W, NP, 20 mg at 12/09/23 2154   feeding supplement (OSMOLITE 1.5 CAL) liquid 1,000 mL, 1,000 mL, Per Tube, Continuous, Aleck Hurdle, MD, Last Rate: 45 mL/hr at 12/10/23 0700, Infusion Verify at 12/10/23 0700   feeding supplement (PROSource TF20) liquid 60 mL, 60 mL, Per Tube, Daily, Desai, Nikita S, MD, 60 mL at 12/09/23 1704   fentaNYL  (SUBLIMAZE ) bolus via infusion 25-100 mcg, 25-100 mcg, Intravenous, Q15 min PRN, Lemmie Pyo, NP   fentaNYL  in NS 250mL (74mcg/ml) infusion-PREMIX, 0-400 mcg/hr, Intravenous, Continuous, Merdis Stalling, MD, Last Rate: 2.5 mL/hr at 12/10/23 0700, 25 mcg/hr at 12/10/23 0700   insulin  aspart (novoLOG ) injection 0-15 Units, 0-15 Units, Subcutaneous, Q4H, Lemmie Pyo, NP, 3 Units at 12/10/23 0355   labetalol  (NORMODYNE ) injection 10 mg, 10 mg, Intravenous,  Q10 min PRN, Lemmie Pyo, NP, 10 mg at 12/09/23 1751   levETIRAcetam  (KEPPRA ) undiluted injection 1,500 mg, 1,500 mg, Intravenous, Q12H, Augustin Leber, MD, 1,500 mg at 12/09/23 2149   LORazepam  (ATIVAN ) injection 2 mg, 2 mg, Intravenous, Q5 Min x 3 PRN, Lemmie Pyo, NP   metoprolol  tartrate (LOPRESSOR ) tablet 25 mg, 25 mg, Per Tube, BID, Desai, Nikita S, MD, 25 mg at 12/09/23 2154   multivitamin with minerals tablet 1 tablet, 1 tablet, Per Tube, Daily, Desai, Nikita S, MD, 1 tablet at 12/09/23 1704   mupirocin  ointment (BACTROBAN ) 2 % 1  Application, 1 Application, Nasal, BID, Claven Cumming, MD, 1 Application at 12/09/23 2202   Oral care mouth rinse, 15 mL, Mouth Rinse, Q2H, Aleck Hurdle, MD, 15 mL at 12/10/23 1610   Oral care mouth rinse, 15 mL, Mouth Rinse, PRN, Desai, Nikita S, MD   PARoxetine  (PAXIL ) tablet 40 mg, 40 mg, Per Tube, Daily, Desai, Nikita S, MD, 40 mg at 12/09/23 1238   polyethylene glycol (MIRALAX  / GLYCOLAX ) packet 17 g, 17 g, Per Tube, Daily PRN, Lemmie Pyo, NP   propofol  (DIPRIVAN ) 1000 MG/100ML infusion, 5-80 mcg/kg/min, Intravenous, Continuous, Scotty Cyphers, RPH, Stopped at 12/09/23 9604   thiamine  (VITAMIN B1) tablet 100 mg, 100 mg, Per Tube, Daily, Desai, Nikita S, MD, 100 mg at 12/09/23 1704   valproate (DEPACON ) 500 mg in dextrose  5 % 50 mL IVPB, 500 mg, Intravenous, Q8H, Augustin Leber, MD, Stopped at 12/10/23 0117  Labs and Diagnostic Imaging   CBC:  Recent Labs  Lab 12/08/23 1904 12/08/23 1908 12/09/23 0500 12/10/23 0533  WBC 19.5*  --  15.2* 13.0*  NEUTROABS 15.1*  --   --   --   HGB 13.6   < > 12.3* 12.5*  HCT 40.5   < > 37.2* 38.0*  MCV 89.0  --  88.4 88.0  PLT 286  --  260 264   < > = values in this interval not displayed.    Basic Metabolic Panel:  Lab Results  Component Value Date   NA 137 12/10/2023   K 3.6 12/10/2023   CO2 22 12/10/2023   GLUCOSE 194 (H) 12/10/2023   BUN 13 12/10/2023   CREATININE 0.98 12/10/2023   CALCIUM  8.7 (L) 12/10/2023   GFRNONAA >60 12/10/2023   Lipid Panel:  Lab Results  Component Value Date   LDLCALC 40 10/05/2022   HgbA1c:  Lab Results  Component Value Date   HGBA1C 5.4 07/01/2023   Urine Drug Screen:     Component Value Date/Time   LABOPIA NONE DETECTED 07/01/2023 1127   COCAINSCRNUR NONE DETECTED 07/01/2023 1127   LABBENZ NONE DETECTED 07/01/2023 1127   AMPHETMU NONE DETECTED 07/01/2023 1127   THCU NONE DETECTED 07/01/2023 1127   LABBARB NONE DETECTED 07/01/2023 1127    Alcohol Level     Component Value  Date/Time   ETH <10 07/01/2023 1236   INR  Lab Results  Component Value Date   INR 1.3 (H) 07/02/2023   APTT  Lab Results  Component Value Date   APTT 32 07/02/2023   AED levels:  Lab Results  Component Value Date   LEVETIRACETA 18.8 12/08/2023  Valproate level 64 Ammonia 19 Magnesium  1.5-->2 today  CT Head without contrast(Personally reviewed): Extensive old right MCA infarct.  No acute findings  Overnight EEG: Cortical dysfunction in right hemisphere likely secondary to underlying stroke or postictal state.  Additionally moderate diffuse encephalopathy.  No seizures  or epileptiform discharges seen during recording.  Assessment   Jacob Paxten Appelt. is a 68 y.o. 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  Loaded with Jacob home medication of Keppra  and added Depakote, emergently intubated and transferred for higher level of care at [facility]. Impression: Status epilepticus due to breakthrough seizure, electrographically resolved, currently still remains encephalopathic-likely secondary to postictal state and lingering effects of sedation  Recommendations  Try to keep off sedation as much as possible Continue EEG till somewhat better exam is obtained Continue current antiseizure medications Keppra  1500 twice daily and Depakote 500-3 times daily Check Depakote level in the morning tomorrow Extubation per primary team  Will follow Plan relayed to Dr. Dione Franks ______________________________________________________________________   Signed, Tona Francis, MD [facility]  CRITICAL CARE ATTESTATION Performed by: Tona Francis, MD Total critical care time: 30 minutes Critical care time was exclusive of separately billable procedures and treating other patients and/or supervising APPs/Residents/Students Critical care was necessary to treat or prevent imminent  or life-threatening deterioration. This patient is critically ill and at significant risk for neurological worsening and/or death and care requires constant monitoring. Critical care was time spent personally by me on the following activities: development of treatment plan with patient and/or surrogate as well as nursing, discussions with consultants, evaluation of patient's response to treatment, examination of patient, obtaining history from patient or surrogate, ordering and performing treatments and interventions, ordering and review of laboratory studies, ordering and review of radiographic studies, pulse oximetry, re-evaluation of patient's condition, participation in multidisciplinary rounds and medical decision making of high complexity in the care of this patient.

## 2023-12-11 ENCOUNTER — Inpatient Hospital Stay (HOSPITAL_COMMUNITY)

## 2023-12-11 ENCOUNTER — Encounter (HOSPITAL_COMMUNITY)

## 2023-12-11 DIAGNOSIS — E162 Hypoglycemia, unspecified: Secondary | ICD-10-CM | POA: Diagnosis not present

## 2023-12-11 DIAGNOSIS — G40909 Epilepsy, unspecified, not intractable, without status epilepticus: Secondary | ICD-10-CM | POA: Diagnosis not present

## 2023-12-11 DIAGNOSIS — E119 Type 2 diabetes mellitus without complications: Secondary | ICD-10-CM | POA: Diagnosis not present

## 2023-12-11 DIAGNOSIS — J9690 Respiratory failure, unspecified, unspecified whether with hypoxia or hypercapnia: Secondary | ICD-10-CM | POA: Diagnosis not present

## 2023-12-11 DIAGNOSIS — J9601 Acute respiratory failure with hypoxia: Secondary | ICD-10-CM | POA: Diagnosis not present

## 2023-12-11 DIAGNOSIS — Z8673 Personal history of transient ischemic attack (TIA), and cerebral infarction without residual deficits: Secondary | ICD-10-CM

## 2023-12-11 DIAGNOSIS — Z8659 Personal history of other mental and behavioral disorders: Secondary | ICD-10-CM

## 2023-12-11 DIAGNOSIS — G9340 Encephalopathy, unspecified: Secondary | ICD-10-CM | POA: Diagnosis not present

## 2023-12-11 DIAGNOSIS — G40901 Epilepsy, unspecified, not intractable, with status epilepticus: Secondary | ICD-10-CM | POA: Diagnosis not present

## 2023-12-11 DIAGNOSIS — I10 Essential (primary) hypertension: Secondary | ICD-10-CM | POA: Diagnosis not present

## 2023-12-11 LAB — CBC
HCT: 34.6 % — ABNORMAL LOW (ref 39.0–52.0)
Hemoglobin: 11.6 g/dL — ABNORMAL LOW (ref 13.0–17.0)
MCH: 29.4 pg (ref 26.0–34.0)
MCHC: 33.5 g/dL (ref 30.0–36.0)
MCV: 87.6 fL (ref 80.0–100.0)
Platelets: 235 10*3/uL (ref 150–400)
RBC: 3.95 MIL/uL — ABNORMAL LOW (ref 4.22–5.81)
RDW: 13.6 % (ref 11.5–15.5)
WBC: 12.7 10*3/uL — ABNORMAL HIGH (ref 4.0–10.5)
nRBC: 0 % (ref 0.0–0.2)

## 2023-12-11 LAB — BASIC METABOLIC PANEL WITH GFR
Anion gap: 6 (ref 5–15)
BUN: 14 mg/dL (ref 8–23)
CO2: 25 mmol/L (ref 22–32)
Calcium: 8.7 mg/dL — ABNORMAL LOW (ref 8.9–10.3)
Chloride: 111 mmol/L (ref 98–111)
Creatinine, Ser: 0.94 mg/dL (ref 0.61–1.24)
GFR, Estimated: >60 mL/min (ref >60)
Glucose, Bld: 161 mg/dL — ABNORMAL HIGH (ref 70–99)
Potassium: 4 mmol/L (ref 3.5–5.1)
Sodium: 142 mmol/L (ref 135–145)

## 2023-12-11 LAB — MAGNESIUM: Magnesium: 1.8 mg/dL (ref 1.7–2.4)

## 2023-12-11 LAB — GLUCOSE, CAPILLARY
Glucose-Capillary: 151 mg/dL — ABNORMAL HIGH (ref 70–99)
Glucose-Capillary: 160 mg/dL — ABNORMAL HIGH (ref 70–99)
Glucose-Capillary: 133 mg/dL — ABNORMAL HIGH (ref 70–99)
Glucose-Capillary: 122 mg/dL — ABNORMAL HIGH (ref 70–99)
Glucose-Capillary: 97 mg/dL (ref 70–99)

## 2023-12-11 LAB — VALPROIC ACID LEVEL: Valproic Acid Lvl: 71 ug/mL (ref 50–100)

## 2023-12-11 LAB — PHOSPHORUS: Phosphorus: 2.4 mg/dL — ABNORMAL LOW (ref 2.5–4.6)

## 2023-12-11 MED ORDER — VALPROIC ACID 250 MG/5ML PO SOLN
750.0000 mg | Freq: Two times a day (BID) | ORAL | Status: DC
Start: 2023-12-11 — End: 2023-12-11

## 2023-12-11 MED ORDER — VALPROATE SODIUM 100 MG/ML IV SOLN
750.0000 mg | Freq: Two times a day (BID) | INTRAVENOUS | Status: DC
  Administered 2023-12-11: 750 mg via INTRAVENOUS
  Filled 2023-12-11 (×4): qty 7.5

## 2023-12-11 MED ORDER — MAGNESIUM SULFATE 2 GM/50ML IV SOLN
2.0000 g | Freq: Once | INTRAVENOUS | Status: AC
  Administered 2023-12-11: 2 g via INTRAVENOUS
  Filled 2023-12-11: qty 50

## 2023-12-11 MED ORDER — FENTANYL CITRATE PF 50 MCG/ML IJ SOSY: 25.0000 ug | PREFILLED_SYRINGE | INTRAMUSCULAR | Status: DC | PRN

## 2023-12-11 MED ORDER — K PHOS MONO-SOD PHOS DI & MONO 155-852-130 MG PO TABS
250.0000 mg | ORAL_TABLET | Freq: Once | ORAL | Status: AC
  Administered 2023-12-11: 250 mg
  Filled 2023-12-11: qty 1

## 2023-12-11 MED ORDER — LEVETIRACETAM 500 MG PO TABS: 1500.0000 mg | ORAL_TABLET | Freq: Two times a day (BID) | ORAL | Status: DC

## 2023-12-11 MED ORDER — VALPROATE SODIUM 100 MG/ML IV SOLN: 750.0000 mg | Freq: Three times a day (TID) | INTRAVENOUS | Status: DC

## 2023-12-11 MED ORDER — LEVETIRACETAM (KEPPRA) 500 MG/5 ML ADULT IV PUSH
1500.0000 mg | Freq: Two times a day (BID) | INTRAVENOUS | Status: DC
Start: 2023-12-11 — End: 2023-12-12
  Administered 2023-12-11 – 2023-12-12 (×2): 1500 mg via INTRAVENOUS
  Filled 2023-12-11 (×2): qty 15

## 2023-12-11 MED ORDER — VALPROIC ACID 250 MG/5ML PO SOLN
750.0000 mg | Freq: Two times a day (BID) | ORAL | Status: DC
  Filled 2023-12-11: qty 15

## 2023-12-11 MED ORDER — FUROSEMIDE 10 MG/ML IJ SOLN
20.0000 mg | Freq: Once | INTRAMUSCULAR | Status: AC
  Administered 2023-12-11: 20 mg via INTRAVENOUS
  Filled 2023-12-11: qty 2

## 2023-12-11 NOTE — Progress Notes (Signed)
   12/11/23 0752  Daily Weaning Assessment  Daily Assessment of Readiness to Wean Wean protocol criteria met (SBT performed)  SBT Method CPAP 5 cm H20 and PS 5 cm H20 (15/5)  Weaning Start Time (S)  0752  Patient response Failed SBT terminated  Reason SBT Terminated (S)  Apnea

## 2023-12-11 NOTE — Procedures (Addendum)
 Patient Name: [name].  MRN: [id number]  Epilepsy Attending: Arleene Lack  Referring Physician/Provider: Augustin Leber, MD  Duration: 12/11/2023 0123 to 12/11/2023 0947   Patient history:  68 y.o. male with a history of previous stroke and subsequent seizures who presented with refractory status epilepticus.  EEG to evaluate for seizure.   Level of alertness: lethargic   AEDs during EEG study: LEV, VPA   Technical aspects: This EEG study was done with scalp electrodes positioned according to the 10-20 International system of electrode placement. Electrical activity was reviewed with band pass filter of 1-70Hz , sensitivity of 7 uV/mm, display speed of 38mm/sec with a 60Hz  notched filter applied as appropriate. EEG data were recorded continuously and digitally stored. Video monitoring was available and reviewed as appropriate.   Description: The posterior dominant rhythm consists of 8 Hz activity of moderate voltage (25-35 uV) seen predominantly in posterior head regions, symmetric and reactive to eye opening and eye closing. Sleep was characterized by sleep spindles (12-14hz ), maximal fronto-central region. EEG showed continuous generalized and lateralized right hemisphere predominantly 5 to 7 Hz theta slowing admixed with intermittent 2-3hz  delta slowing, at times with triphasic morphology. Hyperventilation and photic stimulation were not performed.      ABNORMALITY - Continuous slow, generalized and lateralized right hemisphere   IMPRESSION: This study is suggestive of cortical dysfunction in right hemisphere likely secondary to underlying stroke, post-ictal state. Additionally there was moderate diffuse encephalopathy. No seizures or epileptiform discharges were seen throughout the recording.   Tiawana Forgy O Rinaldo Macqueen

## 2023-12-11 NOTE — Progress Notes (Signed)
 Wasted 150 ml Fentanyl  from iv bag after extubation. Witnessed by Caridad Chard, RN.

## 2023-12-11 NOTE — Progress Notes (Signed)
 [facility] ADULT ICU REPLACEMENT PROTOCOL   The patient does apply for the [facility] Adult ICU Electrolyte Replacment Protocol based on the criteria listed below:   1.Exclusion criteria: TCTS, ECMO, Dialysis, and Myasthenia Gravis patients 2. Is GFR >/= 30 ml/min? Yes.    Patient's GFR today is >60 3. Is SCr </= 2? Yes.   Patient's SCr is 0.94 mg/dL 4. Did SCr increase >/= 0.5 in 24 hours? No. 5.Pt's weight >40kg  Yes.   6. Abnormal electrolyte(s): Mag  7. Electrolytes replaced per protocol 8.  Call MD STAT for K+ </= 2.5, Phos </= 1, or Mag </= 1 Physician:  Allegra Arch Tarence Searcy 12/11/2023 5:29 AM

## 2023-12-11 NOTE — Progress Notes (Signed)
LTM maint complete - no skin breakdown under: Fp1 Fp2 F3 [facility] monitored, Event button test confirmed by [facility].

## 2023-12-11 NOTE — Progress Notes (Addendum)
 eLink Physician-Brief Progress Note Patient Name: Rilan Eiland. DOB: [birth year] MRN: [id number]   Date of Service  12/11/2023  HPI/Events of Note  Patient initially presented with status epilepticus and was intubated.  Now extubated but lost his enteral access.  Not safe to swallow independently at this time due to somnolence.  On examination, remains somnolent and having a hard time clearing secretions.  eICU Interventions  NT suction as needed  Insert NG tube for meds   0059 - Received Valproic  acid and Keppra  IV, unable to place NG tube, hold remaining meds and can reattempt in AM  Intervention Category Minor Interventions: Clinical assessment - ordering diagnostic tests  Khristina Janota 12/11/2023, 11:01 PM

## 2023-12-11 NOTE — Progress Notes (Signed)
 Pt follows commands and is interactive with questionable orientation while still on ventilator. Pt resisting oral care and personal care. Attempting to bat.hit with Right hand in safety mit.   No family/care giver visit or calls over last 3 days.

## 2023-12-11 NOTE — Progress Notes (Signed)
 Attempted to reach legal guardian, no answer     Eston Hence MSN, AGACNP-BC [place] [facility] 12/11/2023, 1:30 PM

## 2023-12-11 NOTE — Progress Notes (Signed)
 LTM VIDEO EEG discontinued - no skin breakdown at [facility].

## 2023-12-11 NOTE — Procedures (Signed)
 Extubation Procedure Note  Patient Details:   Name: Jacob Mcgrath. DOB: 1956/07/23 MRN: [id number]   Airway Documentation:    Vent end date: 12/11/23 Vent end time: 1746   Evaluation  O2 sats: stable throughout Complications: No apparent complications Patient did tolerate procedure well. Bilateral Breath Sounds: Clear, Diminished   Yes  Pt extubated to 4l [place] with RN and CCM at bedside. Positive cuff leak noted. Pt is tolerating well and vitals stable. RT will monitor.  Gustav Lehmann 12/11/2023, 5:47 PM

## 2023-12-11 NOTE — Progress Notes (Signed)
 NAME:  Jacob Mcgrath., MRN:  [id number], DOB:  [birth year], LOS: 3 ADMISSION DATE:  12/08/2023, CONSULTATION DATE:  5/15 REFERRING MD:  Dr. Drury Geralds, CHIEF COMPLAINT:  Status epilepticus   History of Present Illness:  68 year old male with past medical history as below, which is significant for dementia, diabetes, stroke with left-sided weakness, and recent diagnosis of seizure in December 2024.  He was admitted in January with status epilepticus requiring intubation in January of this year.  Course was complicated by aspiration pneumonia.  He was discharged on Keppra .  He resides in some sort of facility and has a legal guardian.  He presented to [facility] emergency department 5/15 after having seizure witnessed by his roommate.  He did wake up after the seizure but only briefly and then had another seizure.  Facility reports he has been compliant with Keppra .  Upon EMS arrival he had been seizing for approximately 25 minutes and was given IM Versed  in the field without improvement.  Upon arrival to [facility] he had active seizure.  8 total milligrams of IV Ativan  were given without improvement.  He was intubated for airway protection.  Generalized seizure improved after intubation with continuous propofol  infusion but twitches were still observed.  The patient was transferred to [facility] for continuous EEG monitoring and neurology evaluation.  Pertinent  Medical History   has a past medical history of Dementia (HCC), Diabetes mellitus without complication (HCC), Hypertension, and Stroke (HCC).   Significant Hospital Events: Including procedures, antibiotic start and stop dates in addition to other pertinent events   5/15 admitted for status epilepticus. Intubated.   Interim History / Subjective:  No seizures overnight. Right hemispheric cortical dysfunction secondary to previous right MCA stroke. Off propofol  x24 hours. Failed SBT for apnea.   Objective    Blood pressure (!) 152/78, pulse  83, temperature 98.8 F (37.1 C), resp. rate (!) 25, height 5\' 9"  (1.753 m), weight 60.5 kg, SpO2 100%.    Vent Mode: PRVC FiO2 (%):  [40 %] 40 % Set Rate:  [14 bmp] 14 bmp Vt Set:  [570 mL] 570 mL PEEP:  [5 cmH20] 5 cmH20 Plateau Pressure:  [15 cmH20-17 cmH20] 16 cmH20   Intake/Output Summary (Last 24 hours) at 12/11/2023 0934 Last data filed at 12/11/2023 0800 Gross per 24 hour  Intake 1449.66 ml  Output 1210 ml  Net 239.66 ml   Filed Weights   12/08/23 2300 12/09/23 0500 12/10/23 0200  Weight: 60.4 kg 61.6 kg 60.5 kg    Examination: Gen:      Chronically and critically ill appearing M  Neuro: awake, lethargic. Following commands inconsistently. On PSV he has variable respiratory effort -- becoming apneic when not stimulated  HEENT:  NCAT ETT secure anicteric sclera  Lungs:    symmetrical chest expansion, mechanically ventilated  CV:         rr s1s2 cap refill brisk  Abd:      Soft thin  Ext:    LUE contracture  Skin:      c/d/w    Resolved problem list   Assessment and Plan   Status epilepticus Acute encephalopathy Hx CVA, residual L sided def  / spasticity  Dementia  Mood disorder  CT head non-acute. Old stroke likely etiology for bthru sz P -keppra  and VPA per neuro. Anticipate will need to stay on depakote as had breakthrough sz w just keppra  -cont cEEG for now  -minimize sedating meds -- will dc orders  for cont prop and fent, and add PRN fent  -DAPT  -on home paxil , baclofen    Acute hypoxic resp failure P -WUA/SBT  -mentation is most limiting factor for extubation, when not stimulated he goes apneic on PSV. Cont efforts at weaning/progressing toward extubation  -VAP, pulm hygiene   Hypertension P - BID metop  - PRN labetalol    DM2 w hypoglycemia (improved) - CBG monitoring and SSI  Hypophosphatemia Borderline hypomagnesemia -replace  Best Practice (right click and "Reselect all SmartList Selections" daily)   Diet/type: tubefeeds DVT  prophylaxis LMWH Pressure ulcer(s): pressure ulcer assessment deferred  GI prophylaxis: H2B Lines: Central line Foley:  Yes, and it is no longer needed -- will discuss removal  Code Status:  full code Last date of multidisciplinary goals of care discussion [ ]   Labs   CBC: Recent Labs  Lab 12/08/23 1904 12/08/23 1908 12/08/23 2335 12/08/23 2348 12/09/23 0500 12/10/23 0533 12/11/23 0420  WBC 19.5*  --   --   --  15.2* 13.0* 12.7*  NEUTROABS 15.1*  --   --   --   --   --   --   HGB 13.6   < > 13.3 12.9* 12.3* 12.5* 11.6*  HCT 40.5   < > 39.0 38.0* 37.2* 38.0* 34.6*  MCV 89.0  --   --   --  88.4 88.0 87.6  PLT 286  --   --   --  260 264 235   < > = values in this interval not displayed.    Basic Metabolic Panel: Recent Labs  Lab 12/08/23 1904 12/08/23 1908 12/08/23 2335 12/08/23 2348 12/09/23 0500 12/10/23 0533 12/11/23 0420  NA 139 142 140 140 139 137 142  K 3.8 3.9 3.3* 3.7 3.6 3.6 4.0  CL 104 106  --   --  108 107 111  CO2 18*  --   --   --  22 22 25   GLUCOSE 140* 138*  --   --  106* 194* 161*  BUN 15 13  --   --  13 13 14   CREATININE 1.06 1.00  --   --  1.01 0.98 0.94  CALCIUM  9.0  --   --   --  8.4* 8.7* 8.7*  MG 1.7  --   --   --  1.5* 2.0 1.8  PHOS  --   --   --   --  3.3 2.6 2.4*   GFR: Estimated Creatinine Clearance: 65.3 mL/min (by C-G formula based on SCr of 0.94 mg/dL). Recent Labs  Lab 12/08/23 1904 12/09/23 0500 12/09/23 1538 12/10/23 0533 12/11/23 0420  PROCALCITON  --   --  0.23  --   --   WBC 19.5* 15.2*  --  13.0* 12.7*    Liver Function Tests: Recent Labs  Lab 12/08/23 1904  AST 27  ALT 22  ALKPHOS 92  BILITOT 0.7  PROT 7.6  ALBUMIN 3.8   No results for input(s): "LIPASE", "AMYLASE" in the last 168 hours. Recent Labs  Lab 12/09/23 1538  AMMONIA 19    ABG    Component Value Date/Time   PHART 7.395 12/08/2023 2348   PCO2ART 37.9 12/08/2023 2348   PO2ART 89 12/08/2023 2348   HCO3 23.1 12/08/2023 2348   TCO2 24  12/08/2023 2348   ACIDBASEDEF 1.0 12/08/2023 2348   O2SAT 97 12/08/2023 2348     Coagulation Profile: No results for input(s): "INR", "PROTIME" in the last 168 hours.  Cardiac Enzymes: No results  for input(s): "CKTOTAL", "CKMB", "CKMBINDEX", "TROPONINI" in the last 168 hours.  HbA1C: Hgb A1c MFr Bld  Date/Time Value Ref Range Status  07/01/2023 12:36 PM 5.4 4.8 - 5.6 % Final    Comment:    (NOTE) Pre diabetes:          5.7%-6.4%  Diabetes:              >6.4%  Glycemic control for   <7.0% adults with diabetes   10/03/2022 03:10 AM 5.6 4.8 - 5.6 % Final    Comment:    (NOTE)         Prediabetes: 5.7 - 6.4         Diabetes: >6.4         Glycemic control for adults with diabetes: <7.0     CBG: Recent Labs  Lab 12/10/23 1136 12/10/23 1528 12/10/23 1936 12/10/23 2357 12/11/23 0352  GLUCAP 134* 154* 140* 168* 151*    CRITICAL CARE Performed by: Delories Fetter   Total critical care time: 37 minutes  Critical care time was exclusive of separately billable procedures and treating other patients. Critical care was necessary to treat or prevent imminent or life-threatening deterioration.  Critical care was time spent personally by me on the following activities: development of treatment plan with patient and/or surrogate as well as nursing, discussions with consultants, evaluation of patient's response to treatment, examination of patient, obtaining history from patient or surrogate, ordering and performing treatments and interventions, ordering and review of laboratory studies, ordering and review of radiographic studies, pulse oximetry and re-evaluation of patient's condition.'  Eston Hence MSN, AGACNP-BC [place] [facility] Amion for pager  12/11/2023, 9:34 AM

## 2023-12-11 NOTE — Progress Notes (Addendum)
    Seen in follow up Suctioned aggressively, + cuff leak Mechanics are adequate and he is following commands Will plan for extubation Will give 20 lasix Add CPT (will do q4 initially, likely will be able to de-escalate), usual post extubation pulm hygiene measures   Add'l CCT 32 min  Eston Hence MSN, AGACNP-BC [place] [facility] 12/11/2023, 5:48 PM  _____________________________________   More awake this afternoon Will try PSV/CPAP again -- 5/5 40%  Will also dc foley   RT aware --  So far, minute ventilation is good. Resp pattern a little inconsistent.  Will give him some time and see how we level out. if we are doing well in a half hour or so, possible extubation    Eston Hence MSN, AGACNP-BC [facility] Pulmonary/Critical Care Medicine 12/11/2023, 3:42 PM

## 2023-12-11 NOTE — Progress Notes (Signed)
 NEUROLOGY CONSULT FOLLOW UP NOTE   Date of service: Dec 11, 2023 Patient Name: Jacob Mcgrath. MRN:  [id number] DOB:  [birth year]  Interval Hx/subjective  Seen and examined. Remained off of sedation now for a day. More awake today.  Following simple commands  Vitals   Vitals:   12/11/23 0400 12/11/23 0500 12/11/23 0600 12/11/23 0700  BP: (!) 142/65  136/82 (!) 152/78  Pulse: 78 (!) 103 80 83  Resp: 15 20 14  (!) 25  Temp: 99.1 F (37.3 C) 99.1 F (37.3 C) 98.8 F (37.1 C) 98.8 F (37.1 C)  TempSrc: Bladder     SpO2: 100% 100% 100% 100%  Weight:      Height:         Body mass index is 19.7 kg/m.  Physical Exam  General: Remains intubated, no sedation HEENT: Normocephalic/atraumatic Lungs: Vented Neurological exam He is awake, nods yes and no appropriately.  He was able to raise his right arm and leg to command and give me a fist bump but would not close his eyes to command or stick his tongue out-follows commands intermittently. Pupils equal round reactive to light Extraocular movements intact Blinks to threat inconsistently from both sides Facial symmetry difficult to ascertain due to the tube Left spastic hemiparesis Right sided full strength Sensation intact on the right-diminished on the left   Medications  Current Facility-Administered Medications:    acetaminophen  (TYLENOL ) 160 MG/5ML solution 650 mg, 650 mg, Per Tube, Q4H PRN, Ogan, Okoronkwo U, MD   aspirin  chewable tablet 81 mg, 81 mg, Per Tube, Daily, Bowser, Darin Edinger, NP, 81 mg at 12/11/23 0912   baclofen  (LIORESAL ) tablet 5 mg, 5 mg, Per Tube, TID, Desai, Nikita S, MD, 5 mg at 12/11/23 0911   Chlorhexidine  Gluconate Cloth 2 % PADS 6 each, 6 each, Topical, Daily, Claven Cumming, MD, 6 each at 12/10/23 1945   clopidogrel  (PLAVIX ) tablet 75 mg, 75 mg, Per Tube, Daily, Bowser, Darin Edinger, NP, 75 mg at 12/11/23 0913   docusate (COLACE) 50 MG/5ML liquid 100 mg, 100 mg, Per Tube, BID PRN, Lemmie Pyo, NP, 100  mg at 12/11/23 6295   enoxaparin  (LOVENOX ) injection 40 mg, 40 mg, Subcutaneous, Daily, Lemmie Pyo, NP, 40 mg at 12/11/23 0913   famotidine  (PEPCID ) tablet 20 mg, 20 mg, Per Tube, BID, Hoffman, Paul W, NP, 20 mg at 12/11/23 0912   feeding supplement (OSMOLITE 1.5 CAL) liquid 1,000 mL, 1,000 mL, Per Tube, Continuous, Aleck Hurdle, MD, Last Rate: 45 mL/hr at 12/11/23 0800, Infusion Verify at 12/11/23 0800   feeding supplement (PROSource TF20) liquid 60 mL, 60 mL, Per Tube, Daily, Desai, Nikita S, MD, 60 mL at 12/11/23 0911   fentaNYL  (SUBLIMAZE ) injection 25 mcg, 25 mcg, Intravenous, Q15 min PRN, Bowser, Darin Edinger, NP   fentaNYL  (SUBLIMAZE ) injection 25-100 mcg, 25-100 mcg, Intravenous, Q30 min PRN, Bowser, Darin Edinger, NP   insulin  aspart (novoLOG ) injection 0-15 Units, 0-15 Units, Subcutaneous, Q4H, Hoffman, Paul W, NP, 3 Units at 12/11/23 0900   labetalol  (NORMODYNE ) injection 10 mg, 10 mg, Intravenous, Q10 min PRN, Lemmie Pyo, NP, 10 mg at 12/09/23 1751   levETIRAcetam  (KEPPRA ) undiluted injection 1,500 mg, 1,500 mg, Intravenous, Q12H, Augustin Leber, MD, 1,500 mg at 12/11/23 0913   LORazepam  (ATIVAN ) injection 2 mg, 2 mg, Intravenous, Q5 Min x 3 PRN, Lemmie Pyo, NP   metoprolol  tartrate (LOPRESSOR ) tablet 25 mg, 25 mg, Per Tube, BID, Desai, Nikita S, MD, 25 mg  at 12/11/23 0912   multivitamin with minerals tablet 1 tablet, 1 tablet, Per Tube, Daily, Desai, Nikita S, MD, 1 tablet at 12/11/23 1610   mupirocin  ointment (BACTROBAN ) 2 % 1 Application, 1 Application, Nasal, BID, Claven Cumming, MD, 1 Application at 12/11/23 9604   Oral care mouth rinse, 15 mL, Mouth Rinse, Q2H, Aleck Hurdle, MD, 15 mL at 12/11/23 0815   Oral care mouth rinse, 15 mL, Mouth Rinse, PRN, Desai, Nikita S, MD   PARoxetine  (PAXIL ) tablet 40 mg, 40 mg, Per Tube, Daily, Desai, Nikita S, MD, 40 mg at 12/11/23 5409   phosphorus (K PHOS NEUTRAL) tablet 250 mg, 250 mg, Per Tube, Once, Bowser, Darin Edinger, NP    polyethylene glycol (MIRALAX  / GLYCOLAX ) packet 17 g, 17 g, Per Tube, Daily PRN, Lemmie Pyo, NP, 17 g at 12/10/23 8119   thiamine  (VITAMIN B1) tablet 100 mg, 100 mg, Per Tube, Daily, Desai, Nikita S, MD, 100 mg at 12/11/23 0912   valproate (DEPACON ) 500 mg in dextrose  5 % 50 mL IVPB, 500 mg, Intravenous, Q8H, Augustin Leber, MD, Last Rate: 55 mL/hr at 12/11/23 0904, 500 mg at 12/11/23 0904  Labs and Diagnostic Imaging   CBC:  Recent Labs  Lab 12/08/23 1904 12/08/23 1908 12/10/23 0533 12/11/23 0420  WBC 19.5*   < > 13.0* 12.7*  NEUTROABS 15.1*  --   --   --   HGB 13.6   < > 12.5* 11.6*  HCT 40.5   < > 38.0* 34.6*  MCV 89.0   < > 88.0 87.6  PLT 286   < > 264 235   < > = values in this interval not displayed.    Basic Metabolic Panel:  Lab Results  Component Value Date   NA 142 12/11/2023   K 4.0 12/11/2023   CO2 25 12/11/2023   GLUCOSE 161 (H) 12/11/2023   BUN 14 12/11/2023   CREATININE 0.94 12/11/2023   CALCIUM  8.7 (L) 12/11/2023   GFRNONAA >60 12/11/2023   Lipid Panel:  Lab Results  Component Value Date   LDLCALC 40 10/05/2022   HgbA1c:  Lab Results  Component Value Date   HGBA1C 5.4 07/01/2023   Urine Drug Screen:     Component Value Date/Time   LABOPIA NONE DETECTED 07/01/2023 1127   COCAINSCRNUR NONE DETECTED 07/01/2023 1127   LABBENZ NONE DETECTED 07/01/2023 1127   AMPHETMU NONE DETECTED 07/01/2023 1127   THCU NONE DETECTED 07/01/2023 1127   LABBARB NONE DETECTED 07/01/2023 1127    Alcohol Level     Component Value Date/Time   ETH <10 07/01/2023 1236   INR  Lab Results  Component Value Date   INR 1.3 (H) 07/02/2023   APTT  Lab Results  Component Value Date   APTT 32 07/02/2023   AED levels:  Lab Results  Component Value Date   LEVETIRACETA 18.8 12/08/2023  Depakote level is 71 today.  CT Head without contrast(Personally reviewed): Extensive old right MCA infarct.  No acute findings  Overnight EEG: Cortical dysfunction in  the right hemisphere likely secondary to underlying stroke or postictal state.  Moderate diffuse encephalopathy.  No seizures or epileptiform discharges.  Assessment   Jacob Rayshon Albaugh. is a 68 y.o. 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  Loaded with his home medication of Keppra  and added Depakote, emergently intubated and transferred for higher level of care at [facility].  Impression: Breakthrough seizure-resolved Status epilepticus-resolved Encephalopathy due to respiratory failure-resolving Prior stroke History of dementia Episodes of hypoglycemia-improved with tube feeds  Recommendations  Discontinue LTM EEG Continue antiseizure medications as below: (Route change from IV to p.o.) - Keppra  1500 twice daily - Depakote 500 3 times daily-can change to 750 twice daily. Seizure precautions Extubation per primary team Neurology will sign off and be available as needed  Plan relayed to Dr. Dione Franks and Eston Hence, NP ______________________________________________________________________   Signed, Tona Francis, MD [facility]  CRITICAL CARE ATTESTATION Performed by: Tona Francis, MD Total critical care time: 30 minutes Critical care time was exclusive of separately billable procedures and treating other patients and/or supervising APPs/Residents/Students Critical care was necessary to treat or prevent imminent or life-threatening deterioration. This patient is critically ill and at significant risk for neurological worsening and/or death and care requires constant monitoring. Critical care was time spent personally by me on the following activities: development of treatment plan with patient and/or surrogate as well as nursing, discussions with consultants, evaluation of patient's response to treatment, examination of patient, obtaining history from patient or  surrogate, ordering and performing treatments and interventions, ordering and review of laboratory studies, ordering and review of radiographic studies, pulse oximetry, re-evaluation of patient's condition, participation in multidisciplinary rounds and medical decision making of high complexity in the care of this patient.

## 2023-12-12 ENCOUNTER — Inpatient Hospital Stay (HOSPITAL_COMMUNITY)

## 2023-12-12 DIAGNOSIS — G40901 Epilepsy, unspecified, not intractable, with status epilepticus: Secondary | ICD-10-CM | POA: Diagnosis not present

## 2023-12-12 DIAGNOSIS — J9601 Acute respiratory failure with hypoxia: Secondary | ICD-10-CM | POA: Diagnosis not present

## 2023-12-12 DIAGNOSIS — R1310 Dysphagia, unspecified: Secondary | ICD-10-CM | POA: Diagnosis not present

## 2023-12-12 DIAGNOSIS — I10 Essential (primary) hypertension: Secondary | ICD-10-CM | POA: Diagnosis not present

## 2023-12-12 LAB — BASIC METABOLIC PANEL WITH GFR
Anion gap: 9 (ref 5–15)
Anion gap: 13 (ref 5–15)
BUN: 19 mg/dL (ref 8–23)
BUN: 27 mg/dL — ABNORMAL HIGH (ref 8–23)
CO2: 27 mmol/L (ref 22–32)
CO2: 27 mmol/L (ref 22–32)
Calcium: 9.3 mg/dL (ref 8.9–10.3)
Calcium: 9.3 mg/dL (ref 8.9–10.3)
Chloride: 106 mmol/L (ref 98–111)
Chloride: 104 mmol/L (ref 98–111)
Creatinine, Ser: 0.99 mg/dL (ref 0.61–1.24)
Creatinine, Ser: 1.33 mg/dL — ABNORMAL HIGH (ref 0.61–1.24)
GFR, Estimated: >60 mL/min (ref >60)
GFR, Estimated: 59 mL/min — ABNORMAL LOW (ref >60)
Glucose, Bld: 132 mg/dL — ABNORMAL HIGH (ref 70–99)
Glucose, Bld: 131 mg/dL — ABNORMAL HIGH (ref 70–99)
Potassium: 5.1 mmol/L (ref 3.5–5.1)
Potassium: 4.5 mmol/L (ref 3.5–5.1)
Sodium: 142 mmol/L (ref 135–145)
Sodium: 144 mmol/L (ref 135–145)

## 2023-12-12 LAB — TRIGLYCERIDES: Triglycerides: 49 mg/dL (ref <150)

## 2023-12-12 LAB — MAGNESIUM: Magnesium: 1.9 mg/dL (ref 1.7–2.4)

## 2023-12-12 LAB — PHOSPHORUS: Phosphorus: 4.6 mg/dL (ref 2.5–4.6)

## 2023-12-12 LAB — GLUCOSE, CAPILLARY
Glucose-Capillary: 129 mg/dL — ABNORMAL HIGH (ref 70–99)
Glucose-Capillary: 162 mg/dL — ABNORMAL HIGH (ref 70–99)
Glucose-Capillary: 73 mg/dL (ref 70–99)
Glucose-Capillary: 75 mg/dL (ref 70–99)
Glucose-Capillary: 93 mg/dL (ref 70–99)
Glucose-Capillary: 95 mg/dL (ref 70–99)

## 2023-12-12 LAB — CBC
HCT: 39.1 % (ref 39.0–52.0)
Hemoglobin: 12.6 g/dL — ABNORMAL LOW (ref 13.0–17.0)
MCH: 29 pg (ref 26.0–34.0)
MCHC: 32.2 g/dL (ref 30.0–36.0)
MCV: 90.1 fL (ref 80.0–100.0)
Platelets: 272 10*3/uL (ref 150–400)
RBC: 4.34 MIL/uL (ref 4.22–5.81)
RDW: 13.5 % (ref 11.5–15.5)
WBC: 18.8 10*3/uL — ABNORMAL HIGH (ref 4.0–10.5)
nRBC: 0 % (ref 0.0–0.2)

## 2023-12-12 MED ORDER — VALPROIC ACID 250 MG/5ML PO SOLN
750.0000 mg | Freq: Two times a day (BID) | ORAL | Status: DC
  Administered 2023-12-12 – 2023-12-16 (×9): 750 mg via ORAL
  Filled 2023-12-12 (×9): qty 15

## 2023-12-12 MED ORDER — PAROXETINE HCL 20 MG PO TABS
40.0000 mg | ORAL_TABLET | Freq: Every day | ORAL | Status: DC
  Administered 2023-12-12 – 2023-12-16 (×5): 40 mg via ORAL
  Filled 2023-12-12 (×5): qty 2

## 2023-12-12 MED ORDER — CLOPIDOGREL BISULFATE 75 MG PO TABS
75.0000 mg | ORAL_TABLET | Freq: Every day | ORAL | Status: DC
  Administered 2023-12-12 – 2023-12-16 (×5): 75 mg via ORAL
  Filled 2023-12-12 (×5): qty 1

## 2023-12-12 MED ORDER — ACETAMINOPHEN 160 MG/5ML PO SOLN: 650.0000 mg | ORAL | Status: AC | PRN

## 2023-12-12 MED ORDER — POLYETHYLENE GLYCOL 3350 17 G PO PACK
17.0000 g | PACK | Freq: Two times a day (BID) | ORAL | Status: DC
  Administered 2023-12-12 – 2023-12-16 (×7): 17 g via ORAL
  Filled 2023-12-12 (×8): qty 1

## 2023-12-12 MED ORDER — DOCUSATE SODIUM 50 MG/5ML PO LIQD: 100.0000 mg | Freq: Two times a day (BID) | ORAL | Status: DC

## 2023-12-12 MED ORDER — THIAMINE MONONITRATE 100 MG PO TABS
100.0000 mg | ORAL_TABLET | Freq: Every day | ORAL | Status: AC
  Administered 2023-12-12 – 2023-12-13 (×2): 100 mg via ORAL
  Filled 2023-12-12 (×2): qty 1

## 2023-12-12 MED ORDER — DOCUSATE SODIUM 50 MG/5ML PO LIQD
100.0000 mg | Freq: Two times a day (BID) | ORAL | Status: DC
  Administered 2023-12-12 – 2023-12-16 (×9): 100 mg via ORAL
  Filled 2023-12-12 (×9): qty 10

## 2023-12-12 MED ORDER — VALPROIC ACID 250 MG/5ML PO SOLN
750.0000 mg | Freq: Two times a day (BID) | ORAL | Status: DC
  Filled 2023-12-12: qty 15

## 2023-12-12 MED ORDER — ASPIRIN 81 MG PO CHEW
81.0000 mg | CHEWABLE_TABLET | Freq: Every day | ORAL | Status: DC
  Administered 2023-12-12 – 2023-12-16 (×5): 81 mg via ORAL
  Filled 2023-12-12 (×5): qty 1

## 2023-12-12 MED ORDER — MAGNESIUM SULFATE 2 GM/50ML IV SOLN
2.0000 g | Freq: Once | INTRAVENOUS | Status: AC
  Administered 2023-12-12: 2 g via INTRAVENOUS
  Filled 2023-12-12: qty 50

## 2023-12-12 MED ORDER — ADULT MULTIVITAMIN W/MINERALS CH
1.0000 | ORAL_TABLET | Freq: Every day | ORAL | Status: DC
  Administered 2023-12-12 – 2023-12-16 (×5): 1 via ORAL
  Filled 2023-12-12 (×5): qty 1

## 2023-12-12 MED ORDER — POLYETHYLENE GLYCOL 3350 17 G PO PACK
17.0000 g | PACK | Freq: Every day | ORAL | Status: DC | PRN
Start: 2023-12-12 — End: 2023-12-12

## 2023-12-12 MED ORDER — LEVETIRACETAM 500 MG PO TABS
1500.0000 mg | ORAL_TABLET | Freq: Two times a day (BID) | ORAL | Status: DC
  Administered 2023-12-13 – 2023-12-16 (×8): 1500 mg via ORAL
  Filled 2023-12-12 (×8): qty 3

## 2023-12-12 MED ORDER — METOPROLOL TARTRATE 25 MG PO TABS
25.0000 mg | ORAL_TABLET | Freq: Two times a day (BID) | ORAL | Status: DC
  Administered 2023-12-12 – 2023-12-15 (×7): 25 mg via ORAL
  Filled 2023-12-12 (×8): qty 1

## 2023-12-12 MED ORDER — ENSURE ENLIVE PO LIQD
237.0000 mL | Freq: Two times a day (BID) | ORAL | Status: DC
  Administered 2023-12-12 – 2023-12-16 (×7): 237 mL via ORAL

## 2023-12-12 MED ORDER — BACLOFEN 5 MG HALF TABLET
5.0000 mg | ORAL_TABLET | Freq: Three times a day (TID) | ORAL | Status: DC
  Administered 2023-12-12 – 2023-12-16 (×13): 5 mg via ORAL
  Filled 2023-12-12 (×13): qty 1

## 2023-12-12 NOTE — Procedures (Signed)
 Modified Barium Swallow Study  Patient Details  Name: Jacob Mcgrath. MRN: [id number] Date of Birth: 1956/01/12  Today's Date: 12/12/2023  Modified Barium Swallow completed.  Full report located under Chart Review in the Imaging Section.  History of Present Illness Jacob Mcgrath is a 68 year old male who resides in facility and has a legal guardian, and presented to [facility] emergency department 5/15 after having seizure witnessed by his roommate which lasted 25 minutes prior to EMS arrival.  Pt required ETT 5/15-18 for airway protection.  Pt with past medical history which is significant for dementia, diabetes, stroke with left-sided weakness, and recent diagnosis of seizure in December 2024. He was admitted in January with status epilepticus requiring intubation in January of this year. Course was complicated by aspiration pneumonia. Seen by speech with clinical management of dyspahgia and placed on regular diet with thin liquids.   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  These deficits resulted in intermittent oral holding of bolus trials, prolonged oral phase, delayed swallow initation intermittently to the pyriform sinuses, and oral and vallecula residue.  There a single throat clear observed during study which at time time was suspected to be 2/2 penetration of residuals, but could not be found on repeat viewing suggesting throat clear in absence of penetration/aspiration.  Pt benefited from cues to take multiple swallows to decrease oral and pharyngeal residue, but was able to adequately protect airway throughout today's study.  Pt should be able to advance diet clinically.  Oral holding was noted for longer periods of time with liquid than with solid trials, and pt  benefitted from verbal cues to swallow.  With pill simulation, pt prefered to swallow tablet with puree.  Pt was able acheive oral clearance and on esopahgeal sweep there was only trace-mild retention of contrast in distal esophagus.  Recommend chopped/ground (dysphagia 2) diet with thin liquids. Factors that may increase risk of adverse event in presence of aspiration Roderick Civatte & Jessy Morocco 2021): Reduced cognitive function  Swallow Evaluation Recommendations Recommendations: PO diet PO Diet Recommendation: Dysphagia 2 (Finely chopped) Liquid Administration via: Cup;Straw Medication Administration: Whole meds with puree Supervision: Staff to assist with self-feeding;Intermittent supervision/cueing for swallowing strategies (PRN) Swallowing strategies  : Slow rate;Small bites/sips;Multiple dry swallows after each bite/sip;Check for pocketing or oral holding (verbal cues to swallow) Postural changes: Position pt fully upright for meals Oral care recommendations: Oral care BID (2x/day)      Koriana Stepien E Kayston Jodoin 12/12/2023,10:47 AM

## 2023-12-12 NOTE — Progress Notes (Addendum)
 NAME:  Jacob Brumbach., MRN:  [id number], DOB:  [birth year], LOS: 4 ADMISSION DATE:  12/08/2023, CONSULTATION DATE:  5/15 REFERRING MD:  Dr. Drury Geralds, CHIEF COMPLAINT:  Status epilepticus   History of Present Illness:  68 year old male with past medical history as below, which is significant for dementia, diabetes, stroke with left-sided weakness, and recent diagnosis of seizure in December 2024.  He was admitted in January with status epilepticus requiring intubation in January of this year.  Course was complicated by aspiration pneumonia.  He was discharged on Keppra .  He resides in some sort of facility and has a legal guardian.  He presented to [facility] emergency department 5/15 after having seizure witnessed by his roommate.  He did wake up after the seizure but only briefly and then had another seizure.  Facility reports he has been compliant with Keppra .  Upon EMS arrival he had been seizing for approximately 25 minutes and was given IM Versed  in the field without improvement.  Upon arrival to [facility] he had active seizure.  8 total milligrams of IV Ativan  were given without improvement.  He was intubated for airway protection.  Generalized seizure improved after intubation with continuous propofol  infusion but twitches were still observed.  The patient was transferred to [facility] for continuous EEG monitoring and neurology evaluation.  Pertinent  Medical History   has a past medical history of Dementia (HCC), Diabetes mellitus without complication (HCC), Hypertension, and Stroke (HCC).   Significant Hospital Events: Including procedures, antibiotic start and stop dates in addition to other pertinent events   5/15 admitted for status epilepticus. Intubated.  5/18 extubated  Interim History / Subjective:  Extubated yesterday LTM stopped yesterday Required some nt suction some overnight Stable today on [place] AO and MAE  Objective    Blood pressure (!) 95/47, pulse (!) 104,  temperature 99.1 F (37.3 C), temperature source Axillary, resp. rate (!) 21, height 5\' 9"  (1.753 m), weight 59.5 kg, SpO2 100%.    Vent Mode: PSV;CPAP FiO2 (%):  [40 %] 40 % PEEP:  [5 cmH20] 5 cmH20 Pressure Support:  [5 cmH20-8 cmH20] 5 cmH20   Intake/Output Summary (Last 24 hours) at 12/12/2023 0859 Last data filed at 12/12/2023 0800 Gross per 24 hour  Intake 122.51 ml  Output 1400 ml  Net -1277.49 ml   Filed Weights   12/09/23 0500 12/10/23 0200 12/12/23 0720  Weight: 61.6 kg 60.5 kg 59.5 kg    Examination: General:   NAD HEENT: MM pink/moist; [place] in place; voice hoarse Neuro: AO; weak but MAE to command CV: s1s2, no m/r/g PULM:  dim clear BS bilaterally; [place]  GI: soft, bsx4 active  Extremities: warm/dry, no edema    Resolved problem list   Assessment and Plan   Status epilepticus Acute encephalopathy Hx CVA, residual L sided def  / spasticity  Dementia  Mood disorder  CT head non-acute. Old stroke likely etiology for bthru sz P: -neuro signed off 5/18; cont keppra  and vpa switch iv to po/per tube -limit sedating meds -PT/OT -DAPT  -on home paxil , baclofen    Acute hypoxic resp failure P: -extubated yesterday -cont [place] for sats >92% -pulm toiletry: CPT -nt suction prn  Hypertension P: - BID metop  - PRN labetalol    Dysphagia P: -SLP following -plan for swallow eval; will likely need cor track  DM2 w hypoglycemia (improved) P: - CBG monitoring and SSI  Hypophosphatemia Borderline hypomagnesemia P: -trend and replete as needed  Best Practice (  right click and "Reselect all SmartList Selections" daily)   Diet/type: NPO; getting swallow today; will likely need cor track DVT prophylaxis LMWH Pressure ulcer(s): pressure ulcer assessment deferred  GI prophylaxis: N/A Lines: Central line; consider removing if peripheral iv good Foley:  N/A Code Status:  full code Last date of multidisciplinary goals of care discussion [5/19 attempted to call  Shakeeta and Halie over phone but no answer ]  Labs   CBC: Recent Labs  Lab 12/08/23 1904 12/08/23 1908 12/08/23 2348 12/09/23 0500 12/10/23 0533 12/11/23 0420 12/12/23 0434  WBC 19.5*  --   --  15.2* 13.0* 12.7* 18.8*  NEUTROABS 15.1*  --   --   --   --   --   --   HGB 13.6   < > 12.9* 12.3* 12.5* 11.6* 12.6*  HCT 40.5   < > 38.0* 37.2* 38.0* 34.6* 39.1  MCV 89.0  --   --  88.4 88.0 87.6 90.1  PLT 286  --   --  260 264 235 272   < > = values in this interval not displayed.    Basic Metabolic Panel: Recent Labs  Lab 12/08/23 1904 12/08/23 1908 12/08/23 2335 12/08/23 2348 12/09/23 0500 12/10/23 0533 12/11/23 0420 12/12/23 0434  NA 139 142   < > 140 139 137 142 142  K 3.8 3.9   < > 3.7 3.6 3.6 4.0 5.1  CL 104 106  --   --  108 107 111 106  CO2 18*  --   --   --  22 22 25 27   GLUCOSE 140* 138*  --   --  106* 194* 161* 132*  BUN 15 13  --   --  13 13 14 19   CREATININE 1.06 1.00  --   --  1.01 0.98 0.94 0.99  CALCIUM  9.0  --   --   --  8.4* 8.7* 8.7* 9.3  MG 1.7  --   --   --  1.5* 2.0 1.8 1.9  PHOS  --   --   --   --  3.3 2.6 2.4* 4.6   < > = values in this interval not displayed.   GFR: Estimated Creatinine Clearance: 60.9 mL/min (by C-G formula based on SCr of 0.99 mg/dL). Recent Labs  Lab 12/09/23 0500 12/09/23 1538 12/10/23 0533 12/11/23 0420 12/12/23 0434  PROCALCITON  --  0.23  --   --   --   WBC 15.2*  --  13.0* 12.7* 18.8*    Liver Function Tests: Recent Labs  Lab 12/08/23 1904  AST 27  ALT 22  ALKPHOS 92  BILITOT 0.7  PROT 7.6  ALBUMIN 3.8   No results for input(s): "LIPASE", "AMYLASE" in the last 168 hours. Recent Labs  Lab 12/09/23 1538  AMMONIA 19    ABG    Component Value Date/Time   PHART 7.395 12/08/2023 2348   PCO2ART 37.9 12/08/2023 2348   PO2ART 89 12/08/2023 2348   HCO3 23.1 12/08/2023 2348   TCO2 24 12/08/2023 2348   ACIDBASEDEF 1.0 12/08/2023 2348   O2SAT 97 12/08/2023 2348     Coagulation Profile: No results  for input(s): "INR", "PROTIME" in the last 168 hours.  Cardiac Enzymes: No results for input(s): "CKTOTAL", "CKMB", "CKMBINDEX", "TROPONINI" in the last 168 hours.  HbA1C: Hgb A1c MFr Bld  Date/Time Value Ref Range Status  07/01/2023 12:36 PM 5.4 4.8 - 5.6 % Final    Comment:    (NOTE) Pre diabetes:  5.7%-6.4%  Diabetes:              >6.4%  Glycemic control for   <7.0% adults with diabetes   10/03/2022 03:10 AM 5.6 4.8 - 5.6 % Final    Comment:    (NOTE)         Prediabetes: 5.7 - 6.4         Diabetes: >6.4         Glycemic control for adults with diabetes: <7.0     CBG: Recent Labs  Lab 12/11/23 1630 12/11/23 2012 12/12/23 0006 12/12/23 0432 12/12/23 0731  GLUCAP 122* 97 75 93 95    CRITICAL CARE Performed by: Casimiro Cleaves   Total critical care time: NA  [facility] 12/12/2023, 9:23 AM  Please see [email or web address] for pager details.  From 7A-7P if no response, please call [phone]. After hours, please call ELink [phone].

## 2023-12-12 NOTE — Progress Notes (Signed)
 [facility] ADULT ICU REPLACEMENT PROTOCOL   The patient does apply for the [facility] Adult ICU Electrolyte Replacment Protocol based on the criteria listed below:   1.Exclusion criteria: TCTS, ECMO, Dialysis, and Myasthenia Gravis patients 2. Is GFR >/= 30 ml/min? Yes.    Patient's GFR today is >60 3. Is SCr </= 2? Yes.   Patient's SCr is 0.99 mg/dL 4. Did SCr increase >/= 0.5 in 24 hours? No. 5.Pt's weight >40kg  Yes.   6. Abnormal electrolyte(s): Mag  7. Electrolytes replaced per protocol 8.  Call MD STAT for K+ </= 2.5, Phos </= 1, or Mag </= 1 Physician:  Allegra Arch Shaira Sova 12/12/2023 6:02 AM

## 2023-12-12 NOTE — Progress Notes (Signed)
 PT Cancellation Note  Patient Details Name: Jacob Mcgrath. MRN: [id number] DOB: 1956/07/25   Cancelled Treatment:    Reason Eval/Treat Not Completed: Patient at procedure or test/unavailable   Clara Herbison B Param Capri 12/12/2023, 9:10 AM Annis Baseman, PT Acute Rehabilitation Services Office: [phone]

## 2023-12-12 NOTE — Progress Notes (Signed)
 Nutrition Follow-up  DOCUMENTATION CODES:  Non-severe (moderate) malnutrition in context of social or environmental circumstances  INTERVENTION:  Continue diet advancement as recommended per SLP Currently on dysphagia 2, thin liquids Ensure Enlive po BID, each supplement provides 350 kcal and 20 grams of protein. Last BM >/=3 days ago; consider addition of bowel regimen  NUTRITION DIAGNOSIS:  Moderate Malnutrition related to social / environmental circumstances (dementia, communal living) as evidenced by mild muscle depletion, moderate fat depletion, mild fat depletion, moderate muscle depletion. - remains applicable  GOAL:  Patient will meet greater than or equal to 90% of their needs - goal unmet  MONITOR:  TF tolerance, I & O's, Vent status, Labs  REASON FOR ASSESSMENT:  Consult Assessment of nutrition requirement/status  ASSESSMENT:  Pt with hx of dementia, DM type 2, HTN, and hx CVA presented to ED from facility after witnessed seizures.  5/15 - presented to ED from facility, intubated   5/18 - extubated 5/19 - s/p MBS- diet advanced to dysphagia 2, thin liquids  Patient extubated yesterday.  He is now s/p MBS and diet advancement today.  Checked in with pt at bedside, he is in good spirits and asking for something to eat.  No family present at time of visit.   Will add nutrition supplements to augment oral intake.   Admit weight: 67.5 kg   Current weight: 59.5 kg  Medications: SSI 0-15 units q4h, MVI, thiamine  100mg    Labs:   CBG's 75-133 x24 hours  Diet Order:   Diet Order             DIET DYS 2 Room service appropriate? Yes; Fluid consistency: Thin  Diet effective now                   EDUCATION NEEDS:   Not appropriate for education at this time  Skin:  Skin Assessment: Reviewed RN Assessment  Last BM:  PTA  Height:   Ht Readings from Last 1 Encounters:  12/08/23 5\' 9"  (1.753 m)    Weight:   Wt Readings from Last 1 Encounters:   12/12/23 59.5 kg    Ideal Body Weight:  72.7 kg  BMI:  Body mass index is 19.37 kg/m.  Estimated Nutritional Needs:   Kcal:  1700-1900 kcal/d  Protein:  85-100 g/d  Fluid:  1.8L/d  Rocklin Chute, RDN, LDN Clinical Nutrition See AMiON for contact information.

## 2023-12-12 NOTE — Evaluation (Signed)
 Physical Therapy Evaluation/discharge Patient Details Name: Jacob Mcgrath. MRN: [id number] DOB: 1956-03-19 Today's Date: 12/12/2023  History of Present Illness  68 yo male admitted 12/08/23 from [facility] with seizure. Intubated 5/15-5/18. PMhx: dementia, DM, HTN, HLD, depression, CVA with lt hand contracture, PVD, neuropathy  Clinical Impression  Pt from SNF as long term resisdent and he provides conflicting information of being able to walk with a crutch and using a WC. Per last admission pt requires +2 assist for transfers to [facility] at baseline and this date pt resistant to midline positioning EOB, unable to safely stand fully upright and resistant and unable to follow commands for OOB transfers. Anticipate pt at baseline functional status needing +2 assist for mobility and would recommend use of lift OOB at present. Will defer further needs to facility.      SPO2 98% on RA BP sitting 91/59 (70)    If plan is discharge home, recommend the following: A lot of help with walking and/or transfers;A lot of help with bathing/dressing/bathroom   Can travel by private vehicle   No    Equipment Recommendations None recommended by PT  Recommendations for Other Services       Functional Status Assessment Patient has not had a recent decline in their functional status     Precautions / Restrictions Precautions Precautions: Fall;Other (comment) Recall of Precautions/Restrictions: Impaired Precaution/Restrictions Comments: left hemiparesis with LUE contracture      Mobility  Bed Mobility Overal bed mobility: Needs Assistance Bed Mobility: Rolling, Supine to Sit, Sit to Supine Rolling: Total assist, Max assist   Supine to sit: Mod assist Sit to supine: Max assist, +2 for physical assistance   General bed mobility comments: Pt EOB with OT. Max +2 to transition back to supine. total assist to roll right with pt resistant and trying to push with RUE, max assist to roll left for  pad placement.    Transfers Overall transfer level: Needs assistance   Transfers: Sit to/from Stand Sit to Stand: Max assist, +2 physical assistance           General transfer comment: initial rise from bed min assist with Rt foot blocked and RUE on foot board to push off, assist to transition UE from rail to crutch and pt unable to shift weight or step with maintained hip flexion. Attempted 2 other standing trials with max +2 assist, pt unable to successfully follow cues for standing with posterior bias and resistance unable to transition as squat pivot to chair or stand and ultimately transitioned back to bed    Ambulation/Gait                  Stairs            Wheelchair Mobility     Tilt Bed    Modified Rankin (Stroke Patients Only)       Balance Overall balance assessment: Needs assistance Sitting-balance support: Single extremity supported, Feet supported Sitting balance-Leahy Scale: Poor Sitting balance - Comments: min-mod assist for sitting balance with posterior right lean                                     Pertinent Vitals/Pain Pain Assessment Pain Assessment: No/denies pain    Home Living Family/patient expects to be discharged to:: Skilled nursing facility  Additional Comments: From [facility]. Pt unable to give history of baseline with ADLs. Per chart review, observation, and clinical judgement needs significant at baseline.    Prior Function Prior Level of Function : Needs assist;Patient poor historian/Family not available       Physical Assist : Mobility (physical) Mobility (physical): Bed mobility;Transfers   Mobility Comments: Pt initially stating he gets up and walks with a crutch or uses a WC. Pt states he just pivots to [facility] but question reliability as resistant and unable with +2 assist ADLs Comments: assist with ADLs at baseline     Extremity/Trunk Assessment   Upper  Extremity Assessment Upper Extremity Assessment: Defer to OT evaluation    Lower Extremity Assessment Lower Extremity Assessment: LLE deficits/detail LLE Deficits / Details: pt actively able to move knee and extend however last 10 degress of extension only achieve with assist, assist to move leg for transfers and tendency to pick foot off floor when attempting transfers    Cervical / Trunk Assessment Cervical / Trunk Assessment: Kyphotic;Other exceptions Cervical / Trunk Exceptions: posterior right bias  Communication   Communication Factors Affecting Communication: Reduced clarity of speech    Cognition Arousal: Alert Behavior During Therapy: Flat affect   PT - Cognitive impairments: Difficult to assess, Problem solving, Safety/Judgement, Memory, No family/caregiver present to determine baseline                       PT - Cognition Comments: pt providing varied history, no caregivers present, pt resistant to assist with mobility Following commands: Impaired Following commands impaired: Follows one step commands inconsistently     Cueing       General Comments      Exercises     Assessment/Plan    PT Assessment All further PT needs can be met in the next venue of care  PT Problem List Decreased activity tolerance;Decreased balance;Decreased cognition;Decreased strength;Decreased coordination;Decreased mobility       PT Treatment Interventions      PT Goals (Current goals can be found in the Care Plan section)  Acute Rehab PT Goals PT Goal Formulation: All assessment and education complete, DC therapy    Frequency       Co-evaluation PT/OT/SLP Co-Evaluation/Treatment: Yes Reason for Co-Treatment: Complexity of the patient's impairments (multi-system involvement);Necessary to address cognition/behavior during functional activity;For patient/therapist safety PT goals addressed during session: Mobility/safety with mobility         AM-PAC PT "6  Clicks" Mobility  Outcome Measure Help needed turning from your back to your side while in a flat bed without using bedrails?: A Lot Help needed moving from lying on your back to sitting on the side of a flat bed without using bedrails?: Total Help needed moving to and from a bed to a chair (including a wheelchair)?: Total Help needed standing up from a chair using your arms (e.g., wheelchair or bedside chair)?: Total Help needed to walk in hospital room?: Total Help needed climbing 3-5 steps with a railing? : Total 6 Click Score: 7    End of Session Equipment Utilized During Treatment: Gait belt Activity Tolerance: Patient tolerated treatment well Patient left: in bed;with call bell/phone within reach;with bed alarm set Nurse Communication: Mobility status;Need for lift equipment PT Visit Diagnosis: Other abnormalities of gait and mobility (R26.89)    Time: 1018-1040 PT Time Calculation (min) (ACUTE ONLY): 22 min   Charges:   PT Evaluation $PT Eval Moderate Complexity: 1 Mod   PT  General Charges $$ ACUTE PT VISIT: 1 Visit         Annis Baseman, PT Acute Rehabilitation Services Office: [phone]   Jackey Mary Travis Purk 12/12/2023, 11:14 AM

## 2023-12-12 NOTE — Progress Notes (Signed)
 Pharmacy ICU Bowel Regimen Consult Note   Current Inpatient Medications for Bowel Management:  Docusate 100mg  BID PRN, Miralax  17g daily PRN  Assessment: Jacob Veronica. is a 68 y.o. year old male admitted on 12/08/2023. Constipation identified as non-opioid-induced constipation . LBM on prior to admission.  []  Bowel sounds present  []  No abdominal tenderness  []  Passing gas   Plan: Start docusate 100mg  BID, miralax  17g BID   Thank you for allowing pharmacy to participate in this patient's care.  Monigue Spraggins 12/12/2023,11:09 AM

## 2023-12-12 NOTE — Evaluation (Signed)
 Occupational Therapy Evaluation Patient Details Name: Jacob Mcgrath. MRN: [id number] DOB: 1956/02/07 Today's Date: 12/12/2023   History of Present Illness   68 yo male admitted 12/08/23 from [facility] with seizure. Intubated 5/15-5/18. PMhx: dementia, DM, HTN, HLD, depression, CVA with lt hand contracture, PVD, neuropathy     Clinical Impressions Pt from SNF, reports using crutch at baseline for mobility and also w/c, assist for ADL at baseline. Pt currently with L hemi from prior CVA, needs min-max A for ADL, mod-max +2 for bed mobility and max +2 for standing attempts at EOB. Pt with slow processing and inconsistently following commands. Pt presenting with impairments listed below, however all further needs can be deferred to facility at d/c.      If plan is discharge home, recommend the following:   Two people to help with walking and/or transfers;A lot of help with bathing/dressing/bathroom;Assistance with cooking/housework;Direct supervision/assist for medications management;Direct supervision/assist for financial management;Assist for transportation;Help with stairs or ramp for entrance;Supervision due to cognitive status     Functional Status Assessment   Patient has had a recent decline in their functional status and demonstrates the ability to make significant improvements in function in a reasonable and predictable amount of time.     Equipment Recommendations   Other (comment) (defer)     Recommendations for Other Services         Precautions/Restrictions   Precautions Precautions: Fall;Other (comment) Recall of Precautions/Restrictions: Impaired Precaution/Restrictions Comments: left hemiparesis with LUE contracture Restrictions Weight Bearing Restrictions Per Provider Order: No     Mobility Bed Mobility Overal bed mobility: Needs Assistance Bed Mobility: Rolling, Supine to Sit, Sit to Supine Rolling: Total assist, Max assist   Supine to  sit: Mod assist Sit to supine: Max assist, +2 for physical assistance        Transfers Overall transfer level: Needs assistance   Transfers: Sit to/from Stand Sit to Stand: Max assist, +2 physical assistance                  Balance Overall balance assessment: Needs assistance Sitting-balance support: Single extremity supported, Feet supported Sitting balance-Leahy Scale: Poor                                     ADL either performed or assessed with clinical judgement   ADL Overall ADL's : Needs assistance/impaired Eating/Feeding: Minimal assistance;Sitting;Bed level   Grooming: Minimal assistance;Sitting;Bed level   Upper Body Bathing: Moderate assistance;Sitting   Lower Body Bathing: Maximal assistance;Sitting/lateral leans   Upper Body Dressing : Moderate assistance;Bed level;Sitting   Lower Body Dressing: Maximal assistance;Sitting/lateral leans   Toilet Transfer: Minimal assistance;Moderate assistance;+2 for safety/equipment   Toileting- Clothing Manipulation and Hygiene: Maximal assistance       Functional mobility during ADLs: Minimal assistance;Moderate assistance;+2 for physical assistance       Vision   Additional Comments: appears baseline     Perception Perception: Not tested       Praxis Praxis: Not tested       Pertinent Vitals/Pain Pain Assessment Pain Assessment: No/denies pain     Extremity/Trunk Assessment Upper Extremity Assessment Upper Extremity Assessment: Generalized weakness;LUE deficits/detail LUE Deficits / Details: L hand with digits hyprextended, mild wrist contracture, no AROM   Lower Extremity Assessment Lower Extremity Assessment: Defer to PT evaluation LLE Deficits / Details: pt actively able to move knee and extend however last 10 degress of  extension only achieve with assist, assist to move leg for transfers and tendency to pick foot off floor when attempting transfers   Cervical / Trunk  Assessment Cervical / Trunk Assessment: Kyphotic;Other exceptions Cervical / Trunk Exceptions: posterior right bias   Communication Communication Factors Affecting Communication: Reduced clarity of speech   Cognition Arousal: Alert Behavior During Therapy: Flat affect Cognition: No family/caregiver present to determine baseline             OT - Cognition Comments: inconsistent responses to question/command follow, slow processing and delayed responses, oriented to self and place                 Following commands: Impaired Following commands impaired: Follows one step commands inconsistently     Cueing  General Comments      VSS on RA   Exercises     Shoulder Instructions      Home Living Family/patient expects to be discharged to:: Skilled nursing facility                                 Additional Comments: From [facility]. Pt unable to give history of baseline with ADLs. Per chart review, observation, and clinical judgement needs significant at baseline.      Prior Functioning/Environment Prior Level of Function : Needs assist;Patient poor historian/Family not available  Cognitive Assist : ADLs (cognitive)     Physical Assist : Mobility (physical) Mobility (physical): Bed mobility;Transfers   Mobility Comments: Pt initially stating he gets up and walks with a crutch or uses a WC. Pt states he just pivots to [facility] but question reliability as resistant and unable with +2 assist ADLs Comments: assist with ADLs at baseline    OT Problem List: Decreased strength;Decreased range of motion;Decreased activity tolerance;Impaired balance (sitting and/or standing);Decreased coordination;Decreased cognition;Decreased safety awareness;Impaired UE functional use   OT Treatment/Interventions:        OT Goals(Current goals can be found in the care plan section)   Acute Rehab OT Goals Patient Stated Goal: none stated OT Goal Formulation: With  patient Time For Goal Achievement: 12/26/23 Potential to Achieve Goals: Good   OT Frequency:       Co-evaluation   Reason for Co-Treatment: Complexity of the patient's impairments (multi-system involvement);Necessary to address cognition/behavior during functional activity;For patient/therapist safety PT goals addressed during session: Mobility/safety with mobility        AM-PAC OT "6 Clicks" Daily Activity     Outcome Measure Help from another person eating meals?: A Little Help from another person taking care of personal grooming?: A Little Help from another person toileting, which includes using toliet, bedpan, or urinal?: A Lot Help from another person bathing (including washing, rinsing, drying)?: A Lot Help from another person to put on and taking off regular upper body clothing?: A Lot Help from another person to put on and taking off regular lower body clothing?: A Lot 6 Click Score: 14   End of Session Equipment Utilized During Treatment: Gait belt;Other (comment) (crutch) Nurse Communication: Mobility status  Activity Tolerance: Patient tolerated treatment well Patient left: in bed;with call bell/phone within reach;with bed alarm set  OT Visit Diagnosis: Unsteadiness on feet (R26.81);Other abnormalities of gait and mobility (R26.89);Muscle weakness (generalized) (M62.81)                Time: 1016-1040 OT Time Calculation (min): 24 min Charges:  OT General Charges $OT Visit: 1  Visit OT Evaluation $OT Eval Moderate Complexity: 1 Mod  Annajulia Lewing K, OTD, OTR/L [employer] [facility] Acute Rehab [phone]   Antionette Kirks 12/12/2023, 12:34 PM

## 2023-12-12 NOTE — Evaluation (Signed)
 Clinical/Bedside Swallow Evaluation Patient Details  Name: Jacob Mcgrath. MRN: [id number] Date of Birth: Apr 28, 1956  Today's Date: 12/12/2023 Time: SLP Start Time (ACUTE ONLY): 0816 SLP Stop Time (ACUTE ONLY): 0825 SLP Time Calculation (min) (ACUTE ONLY): 9 min  Past Medical History:  Past Medical History:  Diagnosis Date   Dementia (HCC)    Diabetes mellitus without complication (HCC)    Hypertension    Stroke [facility])    Past Surgical History:  Past Surgical History:  Procedure Laterality Date   ABDOMINAL AORTOGRAM W/LOWER EXTREMITY N/A 10/04/2022   Procedure: ABDOMINAL AORTOGRAM W/LOWER EXTREMITY;  Surgeon: Dannis Dy, MD;  Location: [facility] INVASIVE CV LAB;  Service: Cardiovascular;  Laterality: N/A;   ABDOMINAL AORTOGRAM W/LOWER EXTREMITY N/A 10/08/2022   Procedure: ABDOMINAL AORTOGRAM W/LOWER EXTREMITY;  Surgeon: Carlene Che, MD;  Location: [facility] INVASIVE CV LAB;  Service: Cardiovascular;  Laterality: N/A;   PERIPHERAL VASCULAR INTERVENTION  10/04/2022   Procedure: PERIPHERAL VASCULAR INTERVENTION;  Surgeon: Dannis Dy, MD;  Location: [facility] INVASIVE CV LAB;  Service: Cardiovascular;;   PERIPHERAL VASCULAR INTERVENTION Left 10/08/2022   Procedure: PERIPHERAL VASCULAR INTERVENTION;  Surgeon: Carlene Che, MD;  Location: [facility] INVASIVE CV LAB;  Service: Cardiovascular;  Laterality: Left;   TONSILLECTOMY     HPI:  Jacob Mcgrath is a 68 year old male who resides in facility and has a legal guardian, and presented to [facility] emergency department 5/15 after having seizure witnessed by his roommate which lasted 25 minutes prior to EMS arrival.  Pt required ETT 5/15-18 for airway protection.  Pt with past medical history which is significant for dementia, diabetes, stroke with left-sided weakness, and recent diagnosis of seizure in December 2024. He was admitted in January with status epilepticus requiring intubation in January of this year. Course was complicated by  aspiration pneumonia. Seen by speech with clinical management of dyspahgia and placed on regular diet with thin liquids.    Assessment / Plan / Recommendation  Clinical Impression  Pt presents with clinical indicators of pharyngeal dysphagia.  Pt with hoarse, gravelly vocal quality following 3 day intubation.  SLP provided oral care prior to administration of PO trials.  Pt exhibited wet cough on 1 of 2 trials of thin liquid by spoon.  There were mulitple (3) swallows with cup sip and anterior loss of a portion of bolus.  Pt exhibited delayed cough with puree.  There was prolonged oral phase/oral holding with thin liquids and puree, but pt acheived good oral clearance of puree texture.  Pt would benefit from instrumental assessment prior to initiation of PO diet.  Will plan for MBSS later this date.  Recommend pt remain NPO with alternate means of nutrition, hydration, and medication.  SLP Visit Diagnosis: Dysphagia, unspecified (R13.10)    Aspiration Risk  Moderate aspiration risk    Diet Recommendation NPO    Medication Administration: Via alternative means    Other  Recommendations Oral Care Recommendations: Oral care QID    Recommendations for follow up therapy are one component of a multi-disciplinary discharge planning process, led by the attending physician.  Recommendations may be updated based on patient status, additional functional criteria and insurance authorization.  Follow up Recommendations  (TBD)      Assistance Recommended at Discharge  N/A  Functional Status Assessment  (TBD)  Frequency and Duration  (TBD)          Prognosis Prognosis for improved oropharyngeal function:  (TBD)      Swallow Study  General Date of Onset: 12/08/23 HPI: Jacob Mcgrath is a 68 year old male who resides in facility and has a legal guardian, and presented to [facility] emergency department 5/15 after having seizure witnessed by his roommate which lasted 25 minutes prior to EMS  arrival.  Pt required ETT 5/15-18 for airway protection.  Pt with past medical history which is significant for dementia, diabetes, stroke with left-sided weakness, and recent diagnosis of seizure in December 2024. He was admitted in January with status epilepticus requiring intubation in January of this year. Course was complicated by aspiration pneumonia. Seen by speech with clinical management of dyspahgia and placed on regular diet with thin liquids. Type of Study: Bedside Swallow Evaluation Previous Swallow Assessment: Jan 2025, clinical management Diet Prior to this Study: NPO Temperature Spikes Noted: No Respiratory Status: Nasal cannula History of Recent Intubation: Yes Total duration of intubation (days): 3 days Date extubated: 12/11/23 Behavior/Cognition: Alert;Cooperative;Pleasant mood Oral Cavity Assessment: Within Functional Limits Oral Care Completed by SLP: Yes Oral Cavity - Dentition: Edentulous;Dentures, not available Patient Positioning: Upright in bed Baseline Vocal Quality: Low vocal intensity;Hoarse (Gravelly) Volitional Cough: Weak Volitional Swallow: Able to elicit    Oral/Motor/Sensory Function Overall Oral Motor/Sensory Function: Mild impairment Facial ROM: Reduced right;Reduced left Facial Symmetry: Abnormal symmetry right Lingual ROM: Within Functional Limits Lingual Symmetry: Abnormal symmetry right Lingual Strength: Reduced Velum: Impaired right Mandible: Within Functional Limits   Ice Chips Ice chips: Not tested   Thin Liquid Thin Liquid: Impaired Presentation: Cup;Spoon Oral Phase Functional Implications: Right anterior spillage;Prolonged oral transit;Oral holding Pharyngeal  Phase Impairments: Multiple swallows;Cough - Immediate;Suspected delayed Swallow;Decreased hyoid-laryngeal movement    Nectar Thick Nectar Thick Liquid: Not tested   Honey Thick Honey Thick Liquid: Not tested   Puree Puree: Impaired Oral Phase Functional Implications: Prolonged  oral transit   Solid     Solid: Not tested      [name], MA, CCC-SLP Acute Rehabilitation Services Office: [phone] 12/12/2023,8:49 AM

## 2023-12-12 NOTE — Procedures (Signed)
 Modified Barium Swallow Study  Patient Details  Name: Jacob Mcgrath. MRN: [id number] Date of Birth: September 10, 1955  Today's Date: 12/12/2023  Modified Barium Swallow completed.  Full report located under Chart Review in the Imaging Section.  History of Present Illness Jacob Mcgrath is a 68 year old male who resides in facility and has a legal guardian, and presented to [facility] emergency department 5/15 after having seizure witnessed by his roommate which lasted 25 minutes prior to EMS arrival.  Pt required ETT 5/15-18 for airway protection.  Pt with past medical history which is significant for dementia, diabetes, stroke with left-sided weakness, and recent diagnosis of seizure in December 2024. He was admitted in January with status epilepticus requiring intubation in January of this year. Course was complicated by aspiration pneumonia. Seen by speech with clinical management of dyspahgia and placed on regular diet with thin liquids.   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  These deficits resulted in intermittent oral holding of bolus trials, prolonged oral phase, delayed swallow initation intermittently to the pyriform sinuses, and oral and vallecula residue.  There a single throat clear observed during study which at time time was suspected to be 2/2 penetration of residuals, but could not be found on repeat viewing suggesting throat clear in absence of penetration/aspiration.  Pt benefited from cues to take multiple swallows to decrease oral and pharyngeal residue, but was able to adequately protect airway throughout today's study.  Pt should be able to advance diet clinically.  Oral holding was noted for longer periods of time with liquid than with solid trials, and pt  benefitted from verbal cues to swallow.  With pill simulation, pt prefered to swallow tablet with puree.  Pt was able acheive oral clearance and on esopahgeal sweep there was only trace-mild retention of contrast in distal esophagus.    Recommend chopped/ground (dysphagia 2) diet with thin liquids.  DIGEST Swallow Severity Rating*  Safety: 0  Efficiency: 1  Overall Pharyngeal Swallow Severity: 1 1: mild; 2: moderate; 3: severe; 4: profound  *The Dynamic Imaging Grade of Swallowing Toxicity is standardized for the head and neck cancer population, however, demonstrates promising clinical applications across populations to standardize the clinical rating of pharyngeal swallow safety and severity.  Factors that may increase risk of adverse event in presence of aspiration Roderick Civatte & Jessy Morocco 2021): Reduced cognitive function  Swallow Evaluation Recommendations Recommendations: PO diet PO Diet Recommendation: Dysphagia 2 (Finely chopped) Liquid Administration via: Cup;Straw Medication Administration: Whole meds with puree Supervision: Staff to assist with self-feeding;Intermittent supervision/cueing for swallowing strategies (PRN) Swallowing strategies  :  Slow rate; Small bites/sips; Multiple dry swallows after each bite/sip; Check for pocketing or oral holding; verbal cues to swallow Postural changes: Position pt fully upright for meals Oral care recommendations: Oral care BID (2x/day)      [name] MA, CCC-SLP Acute Rehabilitation Services Office: [phone] 12/12/2023,10:44 AM

## 2023-12-12 NOTE — Plan of Care (Signed)
  Problem: Clinical Measurements: Goal: Ability to maintain clinical measurements within normal limits will improve Outcome: Progressing Goal: Will remain free from infection Outcome: Progressing Goal: Diagnostic test results will improve Outcome: Progressing Goal: Respiratory complications will improve Outcome: Progressing Goal: Cardiovascular complication will be avoided Outcome: Progressing   Problem: Nutrition: Goal: Adequate nutrition will be maintained Outcome: Progressing   Problem: Elimination: Goal: Will not experience complications related to bowel motility Outcome: Progressing Goal: Will not experience complications related to urinary retention Outcome: Progressing   Problem: Pain Managment: Goal: General experience of comfort will improve and/or be controlled Outcome: Progressing   Problem: Safety: Goal: Ability to remain free from injury will improve Outcome: Progressing   Problem: Skin Integrity: Goal: Risk for impaired skin integrity will decrease Outcome: Progressing   Problem: Education: Goal: Ability to describe self-care measures that may prevent or decrease complications (Diabetes Survival Skills Education) will improve Outcome: Progressing Goal: Individualized Educational Video(s) Outcome: Progressing   Problem: Coping: Goal: Ability to adjust to condition or change in health will improve Outcome: Progressing   Problem: Fluid Volume: Goal: Ability to maintain a balanced intake and output will improve Outcome: Progressing   Problem: Metabolic: Goal: Ability to maintain appropriate glucose levels will improve Outcome: Progressing   Problem: Nutritional: Goal: Maintenance of adequate nutrition will improve Outcome: Progressing Goal: Progress toward achieving an optimal weight will improve Outcome: Progressing   Problem: Skin Integrity: Goal: Risk for impaired skin integrity will decrease Outcome: Progressing   Problem: Tissue  Perfusion: Goal: Adequacy of tissue perfusion will improve Outcome: Progressing   Problem: Safety: Goal: Non-violent Restraint(s) Outcome: Progressing

## 2023-12-13 ENCOUNTER — Inpatient Hospital Stay (HOSPITAL_COMMUNITY)

## 2023-12-13 DIAGNOSIS — G40901 Epilepsy, unspecified, not intractable, with status epilepticus: Secondary | ICD-10-CM | POA: Diagnosis not present

## 2023-12-13 LAB — GLUCOSE, CAPILLARY
Glucose-Capillary: 124 mg/dL — ABNORMAL HIGH (ref 70–99)
Glucose-Capillary: 137 mg/dL — ABNORMAL HIGH (ref 70–99)
Glucose-Capillary: 137 mg/dL — ABNORMAL HIGH (ref 70–99)
Glucose-Capillary: 72 mg/dL (ref 70–99)
Glucose-Capillary: 93 mg/dL (ref 70–99)
Glucose-Capillary: 96 mg/dL (ref 70–99)

## 2023-12-13 MED ORDER — ATORVASTATIN CALCIUM 80 MG PO TABS
80.0000 mg | ORAL_TABLET | Freq: Every day | ORAL | Status: DC
  Administered 2023-12-13 – 2023-12-15 (×3): 80 mg via ORAL
  Filled 2023-12-13 (×3): qty 1

## 2023-12-13 MED ORDER — TAMSULOSIN HCL 0.4 MG PO CAPS
0.4000 mg | ORAL_CAPSULE | Freq: Every day | ORAL | Status: DC
  Administered 2023-12-13 – 2023-12-16 (×4): 0.4 mg via ORAL
  Filled 2023-12-13 (×4): qty 1

## 2023-12-13 MED ORDER — ACETAMINOPHEN 500 MG PO TABS: 1000.0000 mg | ORAL_TABLET | Freq: Four times a day (QID) | ORAL | Status: DC | PRN

## 2023-12-13 MED ORDER — PANTOPRAZOLE SODIUM 40 MG PO TBEC
40.0000 mg | DELAYED_RELEASE_TABLET | Freq: Every day | ORAL | Status: DC
  Administered 2023-12-13 – 2023-12-16 (×4): 40 mg via ORAL
  Filled 2023-12-13 (×4): qty 1

## 2023-12-13 NOTE — Progress Notes (Signed)
 [facility] night cross cover note:   I was notified by the patient's RN that the patient is complaining of left upper and left lower extremity discomfort, and is requesting something for pain to help address this.  He recently received his scheduled dose of baclofen .  I subsequently added tylenol  1 g po q6h prn for pain.    Camelia Cavalier, DO Hospitalist

## 2023-12-13 NOTE — Care Management Important Message (Signed)
 Important Message  Patient Details  Name: Jacob Mcgrath. MRN: [id number] Date of Birth: [birth year]   Important Message Given:  Yes - Medicare IM     Wynonia Hedges 12/13/2023, 3:59 PM

## 2023-12-13 NOTE — Progress Notes (Signed)
 PT Cancellation Note  Patient Details Name: Jacob Mcgrath. MRN: [id number] DOB: 11/23/1955   Cancelled Treatment:    Reason Eval/Treat Not Completed: PT screened, no needs identified, will sign off; new orders received, noted pt from long term care and likely not far from baseline.  PT will sign off.    Marley Simmers 12/13/2023, 1:28 PM Abigail Hoff, PT Acute Rehabilitation Services [phone] 12/13/2023

## 2023-12-13 NOTE — Progress Notes (Signed)
 Patient sleeping at this time. Will attempt CPT at a later time.

## 2023-12-13 NOTE — Progress Notes (Signed)
 PROGRESS NOTE                                                                                                                                                                                                             Patient Demographics:    Jacob Mcgrath, is a 68 y.o. male, DOB - 04/09/1956, ZOX:[id number]  Outpatient Primary MD for the patient is Wyvonna Heidelberg, MD    LOS - 5  Admit date - 12/08/2023    Chief Complaint  Patient presents with   Seizures       Brief Narrative (HPI from H&P)   68 year old male with past medical history as below, which is significant for dementia, diabetes, stroke with left-sided weakness, and recent diagnosis of seizure in December 2024. He was admitted in January with status epilepticus requiring intubation in January of this year. Course was complicated by aspiration pneumonia. He was discharged on Keppra . He resides in some sort of facility and has a legal guardian. He presented to [facility] department 5/15 after having seizure witnessed by his roommate. He did wake up after the seizure but only briefly and then had another seizure. Facility reports he has been compliant with Keppra . Upon EMS arrival he had been seizing for approximately 25 minutes and was given IM Versed  in the field without improvement. Upon arrival to [facility] he had active seizure. 8 total milligrams of IV Ativan  were given without improvement. He was intubated for airway protection. Generalized seizure improved after intubation with continuous propofol  infusion but twitches were still observed.   The patient was transferred to [facility] for continuous EEG monitoring and neurology evaluation.  He was admitted by the ICU team, seen by neurology, stabilized, extubated and transferred to my care on 12/13/2023 on day 5 of his hospital stay, still quite weak and frail, still has dysphagia with speech and PT  following.   Significant Hospital Events: Including procedures, antibiotic start and stop dates in addition to other pertinent events   5/15 admitted for status epilepticus. Intubated.  5/18 extubated   Subjective:    Ellene Gustin today has, No headache, No chest pain, No abdominal pain - No Nausea, No new weakness tingling or numbness, no SOB   Assessment  & Plan :    Status epilepticus Acute encephalopathy Hx CVA, residual  L sided def  / spasticity   CT head non-acute. Old stroke likely etiology for bthru sz now on combination of valproic  acid and Keppra , currently appears to be seizure-free, no headache or new focal deficits.  Seen by neuro and they have signed off.  Continue PT, OT and speech therapy.  Advance activity.  Dementia  Mood disorder  -on home paxil , baclofen     Acute hypoxic resp failure P: -extubated, continue chest PT, I-S and flutter valve, currently on room air, monitor.  Still has high risk for aspiration.   Hypertension P: - BID metop  - PRN labetalol     Dysphagia P: -SLP following - Currently on oral diet, core track removed   AKI.  Resume home Flomax  for BPH, monitor bladder scans, avoid nephrotoxins, repeat BMP today.    DM2 w hypoglycemia (improved) P: - CBG monitoring and SSI     Lab Results  Component Value Date   HGBA1C 5.4 07/01/2023   CBG (last 3)  Recent Labs    12/13/23 0021 12/13/23 0353 12/13/23 0745  GLUCAP 124* 96 93         Condition -  Guarded  Family Communication  :  None  Code Status :  Full  Consults  :  PCCM, Neuro  PUD Prophylaxis :  PPI   Procedures  :            Disposition Plan  :    Status is: Inpatient   DVT Prophylaxis  :    enoxaparin  (LOVENOX ) injection 40 mg Start: 12/09/23 1000     Lab Results  Component Value Date   PLT 272 12/12/2023    Diet :  Diet Order             DIET DYS 2 Room service appropriate? Yes; Fluid consistency: Thin  Diet effective now                     Inpatient Medications  Scheduled Meds:  aspirin   81 mg Oral Daily   atorvastatin   80 mg Oral QHS   baclofen   5 mg Oral TID   clopidogrel   75 mg Oral Daily   docusate  100 mg Oral BID   enoxaparin  (LOVENOX ) injection  40 mg Subcutaneous Daily   feeding supplement  237 mL Oral BID BM   insulin  aspart  0-15 Units Subcutaneous Q4H   levETIRAcetam   1,500 mg Oral BID   metoprolol  tartrate  25 mg Oral BID   multivitamin with minerals  1 tablet Oral Daily   mupirocin  ointment  1 Application Nasal BID   mouth rinse  15 mL Mouth Rinse Q2H   pantoprazole   40 mg Oral Daily   PARoxetine   40 mg Oral Daily   polyethylene glycol  17 g Oral BID   tamsulosin   0.4 mg Oral Daily   thiamine   100 mg Oral Daily   valproic  acid  750 mg Oral BID   Continuous Infusions: PRN Meds:.labetalol , LORazepam , mouth rinse  Antibiotics  :    Anti-infectives (From admission, onward)    Start     Dose/Rate Route Frequency Ordered Stop   12/09/23 2200  vancomycin  (VANCOREADY) IVPB 1500 mg/300 mL  Status:  Discontinued        1,500 mg 150 mL/hr over 120 Minutes Intravenous Every 24 hours 12/08/23 2240 12/09/23 0923   12/09/23 0000  metroNIDAZOLE  (FLAGYL ) IVPB 500 mg  Status:  Discontinued        500 mg 100  mL/hr over 60 Minutes Intravenous Every 12 hours 12/08/23 2229 12/09/23 0923   12/08/23 2230  vancomycin  (VANCOREADY) IVPB 1500 mg/300 mL        1,500 mg 150 mL/hr over 120 Minutes Intravenous  Once 12/08/23 2217 12/09/23 0700   12/08/23 2230  ceFEPIme  (MAXIPIME ) 2 g in sodium chloride  0.9 % 100 mL IVPB  Status:  Discontinued        2 g 200 mL/hr over 30 Minutes Intravenous Every 8 hours 12/08/23 2227 12/09/23 0923         Objective:   Vitals:   12/12/23 2322 12/13/23 0020 12/13/23 0352 12/13/23 0745  BP: 111/68 110/74 139/79 105/72  Pulse: (!) 109 (!) 108 96 86  Resp:  18 15 (!) 22  Temp:  98.3 F (36.8 C) 97.8 F (36.6 C) 98.4 F (36.9 C)  TempSrc:    Axillary  SpO2:  98%  99% 99%  Weight:      Height:        Wt Readings from Last 3 Encounters:  12/12/23 59.5 kg  08/04/23 60 kg  07/05/23 59.2 kg     Intake/Output Summary (Last 24 hours) at 12/13/2023 0819 Last data filed at 12/13/2023 0355 Gross per 24 hour  Intake 560 ml  Output 600 ml  Net -40 ml     Physical Exam  Elderly African-American male, overall appears weak and extremely deconditioned, in bed, No new F.N deficits,   .AT,PERRAL Supple Neck, No JVD,   Symmetrical Chest wall movement, Good air movement bilaterally, CTAB RRR,No Gallops,Rubs or new Murmurs,  +ve B.Sounds, Abd Soft, No tenderness,   No Cyanosis, Clubbing or edema     RN pressure injury documentation: Pressure Injury 10/04/22 Sacrum Medial Stage 1 -  Intact skin with non-blanchable redness of a localized area usually over a bony prominence. (Active)  10/04/22 2011  Location: Sacrum  Location Orientation: Medial  Staging: Stage 1 -  Intact skin with non-blanchable redness of a localized area usually over a bony prominence.  Wound Description (Comments):   Present on Admission:       Data Review:    Recent Labs  Lab 12/08/23 1904 12/08/23 1908 12/08/23 2348 12/09/23 0500 12/10/23 0533 12/11/23 0420 12/12/23 0434  WBC 19.5*  --   --  15.2* 13.0* 12.7* 18.8*  HGB 13.6   < > 12.9* 12.3* 12.5* 11.6* 12.6*  HCT 40.5   < > 38.0* 37.2* 38.0* 34.6* 39.1  PLT 286  --   --  260 264 235 272  MCV 89.0  --   --  88.4 88.0 87.6 90.1  MCH 29.9  --   --  29.2 28.9 29.4 29.0  MCHC 33.6  --   --  33.1 32.9 33.5 32.2  RDW 13.4  --   --  13.3 13.5 13.6 13.5  LYMPHSABS 2.4  --   --   --   --   --   --   MONOABS 1.6*  --   --   --   --   --   --   EOSABS 0.2  --   --   --   --   --   --   BASOSABS 0.0  --   --   --   --   --   --    < > = values in this interval not displayed.    Recent Labs  Lab 12/08/23 1904 12/08/23 1908 12/09/23 0500 12/09/23 1538 12/10/23 0533 12/11/23 9528  12/12/23 0434 12/12/23 1459  NA  139   < > 139  --  137 142 142 144  K 3.8   < > 3.6  --  3.6 4.0 5.1 4.5  CL 104   < > 108  --  107 111 106 104  CO2 18*  --  22  --  22 25 27 27   ANIONGAP 17*  --  9  --  8 6 9 13   GLUCOSE 140*   < > 106*  --  194* 161* 132* 131*  BUN 15   < > 13  --  13 14 19  27*  CREATININE 1.06   < > 1.01  --  0.98 0.94 0.99 1.33*  AST 27  --   --   --   --   --   --   --   ALT 22  --   --   --   --   --   --   --   ALKPHOS 92  --   --   --   --   --   --   --   BILITOT 0.7  --   --   --   --   --   --   --   ALBUMIN 3.8  --   --   --   --   --   --   --   PROCALCITON  --   --   --  0.23  --   --   --   --   AMMONIA  --   --   --  19  --   --   --   --   MG 1.7  --  1.5*  --  2.0 1.8 1.9  --   PHOS  --   --  3.3  --  2.6 2.4* 4.6  --   CALCIUM  9.0  --  8.4*  --  8.7* 8.7* 9.3 9.3   < > = values in this interval not displayed.      Recent Labs  Lab 12/08/23 1904 12/09/23 0500 12/09/23 1538 12/10/23 0533 12/11/23 0420 12/12/23 0434 12/12/23 1459  PROCALCITON  --   --  0.23  --   --   --   --   AMMONIA  --   --  19  --   --   --   --   MG 1.7 1.5*  --  2.0 1.8 1.9  --   CALCIUM  9.0 8.4*  --  8.7* 8.7* 9.3 9.3    --------------------------------------------------------------------------------------------------------------- Lab Results  Component Value Date   CHOL 77 10/05/2022   HDL 29 (L) 10/05/2022   LDLCALC 40 10/05/2022   TRIG 49 12/12/2023   CHOLHDL 2.7 10/05/2022    Lab Results  Component Value Date   HGBA1C 5.4 07/01/2023   No results for input(s): "TSH", "T4TOTAL", "FREET4", "T3FREE", "THYROIDAB" in the last 72 hours. No results for input(s): "VITAMINB12", "FOLATE", "FERRITIN", "TIBC", "IRON", "RETICCTPCT" in the last 72 hours. ------------------------------------------------------------------------------------------------------------------ Cardiac Enzymes No results for input(s): "CKMB", "TROPONINI", "MYOGLOBIN" in the last 168 hours.  Invalid input(s): "CK"  Micro  Results Recent Results (from the past 240 hours)  Blood culture (routine x 2)     Status: None (Preliminary result)   Collection Time: 12/08/23  8:27 PM   Specimen: BLOOD  Result Value Ref Range Status   Specimen Description BLOOD BLOOD RIGHT ARM  Final   Special Requests   Final  BOTTLES DRAWN AEROBIC ONLY Blood Culture results may not be optimal due to an inadequate volume of blood received in culture bottles   Culture   Final    NO GROWTH 4 DAYS Performed at [facility], [address] [place], [place] [address]    Report Status PENDING  Incomplete  Blood culture (routine x 2)     Status: None (Preliminary result)   Collection Time: 12/08/23  9:50 PM   Specimen: BLOOD  Result Value Ref Range Status   Specimen Description BLOOD CENTRAL LINE  Final   Special Requests   Final    BOTTLES DRAWN AEROBIC AND ANAEROBIC Blood Culture adequate volume   Culture   Final    NO GROWTH 4 DAYS Performed at [facility], [address] [place], [place] [address]    Report Status PENDING  Incomplete  MRSA Next Gen by PCR, Nasal     Status: Abnormal   Collection Time: 12/08/23 11:50 PM   Specimen: Nasal Mucosa; Nasal Swab  Result Value Ref Range Status   MRSA by PCR Next Gen DETECTED (A) NOT DETECTED Final    Comment: RESULT CALLED TO, READ BACK BY AND VERIFIED WITH: L [id number] RN 12/09/2023 @ 0246 BY AB (NOTE) The GeneXpert MRSA Assay (FDA approved for NASAL specimens only), is one component of a comprehensive MRSA colonization surveillance program. It is not intended to diagnose MRSA infection nor to guide or monitor treatment for MRSA infections. Test performance is not FDA approved in patients less than 10 years old. Performed at [facility], [address], [place], [place] [address]     Radiology Report DG Chest Port 1 View Result Date: 12/13/2023 CLINICAL DATA:  Shortness of breath EXAM: PORTABLE CHEST 1 VIEW COMPARISON:  12/09/2023 FINDINGS: Stable cardiomediastinal  contours. Aortic atherosclerotic calcifications. Interval removal of ET tube and enteric tube. Decreased lung volumes with persistent bilateral lower lobe atelectasis. No signs of pleural effusion or interstitial edema. IMPRESSION: 1. Interval removal of ET tube and enteric tube. 2. Decreased lung volumes with persistent bilateral lower lobe atelectasis. Electronically Signed   By: Kimberley Penman M.D.   On: 12/13/2023 06:43   DG Swallowing Func-Speech Pathology Result Date: 12/12/2023 Table formatting from the original result was not included. Modified Barium Swallow Study Patient Details Name: Jaxson Anglin. MRN: [id number] Date of Birth: 1955-07-31 Today's Date: 12/12/2023 HPI/PMH: HPI: Dewaine Morocho is a 68 year old male who resides in facility and has a legal guardian, and presented to [facility] emergency department 5/15 after having seizure witnessed by his roommate which lasted 25 minutes prior to EMS arrival.  Pt required ETT 5/15-18 for airway protection.  Pt with past medical history which is significant for dementia, diabetes, stroke with left-sided weakness, and recent diagnosis of seizure in December 2024. He was admitted in January with status epilepticus requiring intubation in January of this year. Course was complicated by aspiration pneumonia. Seen by speech with clinical management of dyspahgia and placed on regular diet with thin liquids. 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  These deficits resulted in intermittent oral holding of bolus trials, prolonged oral phase, delayed swallow initation intermittently to the pyriform sinuses, and oral and vallecula residue.  There a single throat clear observed during study which at time time was suspected to be 2/2  penetration of residuals, but could not be found on repeat viewing suggesting throat clear in absence of penetration/aspiration.  Pt benefited from cues to take multiple swallows to decrease oral and pharyngeal residue, but was able to adequately protect airway throughout today's study.  Pt should be able to advance diet clinically.  Oral holding was noted for longer periods of time with liquid than with solid trials, and pt benefitted from verbal cues to swallow.  With pill simulation, pt prefered to swallow tablet with puree.  Pt was able acheive oral clearance and on esopahgeal sweep there was only trace-mild retention of contrast in distal esophagus.  Recommend chopped/ground (dysphagia 2) diet with thin liquids. DIGEST Swallow Severity Rating*  Safety: 0  Efficiency: 1  Overall Pharyngeal Swallow Severity: 1 1: mild; 2: moderate; 3: severe; 4: profound *The Dynamic Imaging Grade of Swallowing Toxicity is standardized for the head and neck cancer population, however, demonstrates promising clinical applications across populations to standardize the clinical rating of pharyngeal swallow safety and severity. Factors that may increase risk of adverse event in presence of aspiration Roderick Civatte & Jessy Morocco 2021): Reduced cognitive function Swallow Evaluation Recommendations Recommendations: PO diet PO Diet Recommendation: Dysphagia 2 (Finely chopped) Liquid Administration via: Cup;Straw Medication Administration: Whole meds with puree Supervision: Staff to assist with self-feeding;Intermittent supervision/cueing for swallowing strategies (PRN) Swallowing strategies  : Slow rate; Small bites/sips; Multiple dry swallows after each bite/sip; Check for pocketing or oral holding; verbal cues to swallow Postural changes: Position pt fully upright for meals Oral care recommendations: Oral care BID (2x/day) Treatment Plan Treatment Plan Treatment recommendations: Therapy as outlined in treatment plan below Follow-up  recommendations: -- (ST at next level of care at present) Functional status assessment: Patient has had a recent decline in their functional status and demonstrates the ability to make significant improvements in function in a reasonable and predictable amount of time. Treatment frequency: Min 2x/week Treatment duration: 2 weeks Interventions: Aspiration precaution training; Trials of upgraded texture/liquids; Diet toleration management by SLP Recommendations Recommendations for follow up therapy are one component of a multi-disciplinary discharge planning process, led by the attending physician.  Recommendations may be updated based on patient status, additional functional criteria and insurance authorization. Assessment: Orofacial Exam: Orofacial Exam Oral Cavity: Oral Hygiene: WFL Oral Cavity - Dentition: Edentulous; Dentures, not available Orofacial Anatomy: WFL Oral Motor/Sensory Function: -- (See BSE) Anatomy: No data recorded Boluses Administered: Boluses Administered Boluses Administered: Thin liquids (Level 0); Mildly thick liquids (Level 2, nectar thick); Moderately thick liquids (Level 3, honey thick); Puree; Solid  Oral Impairment Domain: Oral Impairment Domain Lip Closure: Escape progressing to mid-chin Tongue control during bolus hold: Cohesive bolus between tongue to palatal seal (premature spillage at times, suspect inattention) Bolus preparation/mastication: Slow prolonged chewing/mashing with complete recollection Bolus transport/lingual motion: Delayed initiation of tongue motion (oral holding) Oral residue: Residue collection on oral structures Location of oral residue : Tongue; Floor of mouth Initiation of pharyngeal swallow : Pyriform sinuses; Posterior angle of the ramus  Pharyngeal Impairment Domain: Pharyngeal Impairment Domain Soft palate elevation: No bolus between soft palate (SP)/pharyngeal wall (PW) Laryngeal elevation: Complete superior movement of thyroid cartilage with complete  approximation of arytenoids to epiglottic petiole Anterior hyoid excursion: Complete anterior movement Epiglottic movement: Complete inversion Laryngeal vestibule closure: Complete, no air/contrast in laryngeal vestibule Pharyngeal stripping wave : Present - complete Pharyngeal contraction (A/P view only): N/A Pharyngoesophageal segment opening: Complete distension and complete duration, no obstruction of flow Tongue base retraction: Trace column of contrast or  air between tongue base and PPW Pharyngeal residue: Collection of residue within or on pharyngeal structures Location of pharyngeal residue: Valleculae; Pyriform sinuses  Esophageal Impairment Domain: Esophageal Impairment Domain Esophageal clearance upright position: Esophageal retention (mild) Pill: Pill Consistency administered: Puree Puree: WFL Penetration/Aspiration Scale Score: Penetration/Aspiration Scale Score 1.  Material does not enter airway: Thin liquids (Level 0); Moderately thick liquids (Level 3, honey thick); Mildly thick liquids (Level 2, nectar thick); Pill; Solid; Puree Compensatory Strategies: Compensatory Strategies Compensatory strategies: Yes Multiple swallows: Effective Effective Multiple Swallows: Puree; Pill   General Information: No data recorded Diet Prior to this Study: NPO   No data recorded  No data recorded  No data recorded  No data recorded Behavior/Cognition: Alert; Cooperative; Pleasant mood No data recorded No data recorded No data recorded Volitional Swallow: Able to elicit Exam Limitations: No limitations Goal Planning: Prognosis for improved oropharyngeal function: Good No data recorded No data recorded Patient/Family Stated Goal: not stated Consulted and agree with results and recommendations: Patient; Nurse Pain: Pain Assessment Breathing: 0 Negative Vocalization: 0 Facial Expression: 0 Body Language: 0 Consolability: 0 PAINAD Score: 0 Facial Expression: 0 Body Movements: 0 Muscle Tension: 0 Compliance with ventilator  (intubated pts.): 0 Vocalization (extubated pts.): N/A CPOT Total: 0 End of Session: Start Time:SLP Start Time (ACUTE ONLY): 0935 Stop Time: SLP Stop Time (ACUTE ONLY): 0954 Time Calculation:SLP Time Calculation (min) (ACUTE ONLY): 19 min Charges: SLP Evaluations $ SLP Speech Visit: 1 Visit SLP Evaluations $BSS Swallow: 1 Procedure $MBS Swallow: 1 Procedure SLP visit diagnosis: SLP Visit Diagnosis: Dysphagia, oropharyngeal phase (R13.12) Past Medical History: Past Medical History: Diagnosis Date  Dementia (HCC)   Diabetes mellitus without complication (HCC)   Hypertension   Stroke [facility])  Past Surgical History: Past Surgical History: Procedure Laterality Date  ABDOMINAL AORTOGRAM W/LOWER EXTREMITY N/A 10/04/2022  Procedure: ABDOMINAL AORTOGRAM W/LOWER EXTREMITY;  Surgeon: Dannis Dy, MD;  Location: [facility] INVASIVE CV LAB;  Service: Cardiovascular;  Laterality: N/A;  ABDOMINAL AORTOGRAM W/LOWER EXTREMITY N/A 10/08/2022  Procedure: ABDOMINAL AORTOGRAM W/LOWER EXTREMITY;  Surgeon: Carlene Che, MD;  Location: [facility] INVASIVE CV LAB;  Service: Cardiovascular;  Laterality: N/A;  PERIPHERAL VASCULAR INTERVENTION  10/04/2022  Procedure: PERIPHERAL VASCULAR INTERVENTION;  Surgeon: Dannis Dy, MD;  Location: [facility] INVASIVE CV LAB;  Service: Cardiovascular;;  PERIPHERAL VASCULAR INTERVENTION Left 10/08/2022  Procedure: PERIPHERAL VASCULAR INTERVENTION;  Surgeon: Carlene Che, MD;  Location: [facility] INVASIVE CV LAB;  Service: Cardiovascular;  Laterality: Left;  TONSILLECTOMY   [name], MA, CCC-SLP Acute Rehabilitation Services Office: [phone] 12/12/2023, 10:47 AM    Signature  -   Lynnwood Sauer M.D on 12/13/2023 at 8:19 AM   -  To page go to [email or web address]

## 2023-12-13 NOTE — TOC Initial Note (Signed)
 Transition of Care (TOC) - Initial/Assessment Note    Patient Details  Name: Jacob Mcgrath. MRN: [id number] Date of Birth: 02/04/1956  Transition of Care [facility]) CM/SW Contact:    Jannice Mends, LCSW Phone Number: 12/13/2023, 10:07 AM  Clinical Narrative:                 Patient transferred to 5W. Patient was admitted from [facility] under long term care and has a legal guardian. CSW will continue to follow.   Expected Discharge Plan: Skilled Nursing Facility Barriers to Discharge: Continued Medical Work up   Patient Goals and CMS Choice            Expected Discharge Plan and Services In-house Referral: Clinical Social Work     Living arrangements for the past 2 months: Skilled Nursing Facility                                      Prior Living Arrangements/Services Living arrangements for the past 2 months: Skilled Nursing Facility Lives with:: Facility Resident Patient language and need for interpreter reviewed:: Yes Do you feel safe going back to the place where you live?: Yes      Need for Family Participation in Patient Care: Yes (Comment) Care giver support system in place?: Yes (comment)   Criminal Activity/Legal Involvement Pertinent to Current Situation/Hospitalization: No - Comment as needed  Activities of Daily Living      Permission Sought/Granted Permission sought to share information with : Facility Medical sales representative, Family Supports    Share Information with NAME: Halie  Permission granted to share info w AGENCY: [facility]  Permission granted to share info w Relationship: Legal Guardian  Permission granted to share info w Contact Information: [phone]  Emotional Assessment Appearance:: Appears stated age Attitude/Demeanor/Rapport: Unable to Assess Affect (typically observed): Unable to Assess Orientation: : Oriented to Self, Oriented to Place Alcohol / Substance Use: Not Applicable Psych Involvement: No  (comment)  Admission diagnosis:  Status epilepticus (HCC) [G40.901] Patient Active Problem List   Diagnosis Date Noted   Malnutrition of moderate degree 12/10/2023   Hypomagnesemia 08/02/2023   Community acquired pneumonia of right lower lobe of lung 08/02/2023   Dementia without behavioral disturbance (HCC) 08/02/2023   Acute metabolic encephalopathy 08/02/2023   Essential hypertension 08/02/2023   Status epilepticus (HCC) 07/29/2023   Seizure (HCC) 07/02/2023   Stroke (HCC) 07/01/2023   Leukocytosis 07/01/2023    Class: Acute   Tremor 02/27/2023   GERD (gastroesophageal reflux disease) 02/27/2023   Depression 02/27/2023   Protein-calorie malnutrition, severe 10/07/2022   Ischemic leg 10/01/2022   Hypertension 10/01/2022   Controlled type 2 diabetes mellitus without complication, without long-term current use of insulin  (HCC) 10/01/2022   PCP:  Fanta, Tesfaye Demissie, MD Pharmacy:   [facility] - [place], [place] - [address] [place] [address] Phone: [phone] Fax: [phone]     Social Drivers of Health (SDOH) Social History: SDOH Screenings   Food Insecurity: Patient Unable To Answer (12/09/2023)  Housing: Patient Unable To Answer (12/09/2023)  Transportation Needs: Patient Unable To Answer (12/09/2023)  Utilities: Patient Unable To Answer (12/09/2023)  Social Connections: Patient Unable To Answer (12/09/2023)  Tobacco Use: High Risk (12/08/2023)   SDOH Interventions:     Readmission Risk Interventions    08/01/2023    3:31 PM 03/01/2023    2:40 PM  Readmission Risk Prevention Plan  Transportation Screening Complete Complete  PCP or Specialist Appt within 5-7 Days Complete Not Complete  Home Care Screening Complete Complete  Medication Review (RN CM) Complete Complete

## 2023-12-14 ENCOUNTER — Other Ambulatory Visit: Payer: Self-pay

## 2023-12-14 DIAGNOSIS — R627 Adult failure to thrive: Secondary | ICD-10-CM

## 2023-12-14 DIAGNOSIS — J9690 Respiratory failure, unspecified, unspecified whether with hypoxia or hypercapnia: Secondary | ICD-10-CM | POA: Diagnosis not present

## 2023-12-14 DIAGNOSIS — F0390 Unspecified dementia without behavioral disturbance: Secondary | ICD-10-CM

## 2023-12-14 DIAGNOSIS — G40901 Epilepsy, unspecified, not intractable, with status epilepticus: Secondary | ICD-10-CM | POA: Diagnosis not present

## 2023-12-14 DIAGNOSIS — G9340 Encephalopathy, unspecified: Secondary | ICD-10-CM | POA: Diagnosis not present

## 2023-12-14 DIAGNOSIS — E162 Hypoglycemia, unspecified: Secondary | ICD-10-CM | POA: Diagnosis not present

## 2023-12-14 LAB — GLUCOSE, CAPILLARY
Glucose-Capillary: 91 mg/dL (ref 70–99)
Glucose-Capillary: 259 mg/dL — ABNORMAL HIGH (ref 70–99)
Glucose-Capillary: 56 mg/dL — ABNORMAL LOW (ref 70–99)
Glucose-Capillary: 81 mg/dL (ref 70–99)
Glucose-Capillary: 56 mg/dL — ABNORMAL LOW (ref 70–99)
Glucose-Capillary: 114 mg/dL — ABNORMAL HIGH (ref 70–99)
Glucose-Capillary: 112 mg/dL — ABNORMAL HIGH (ref 70–99)
Glucose-Capillary: 131 mg/dL — ABNORMAL HIGH (ref 70–99)
Glucose-Capillary: 123 mg/dL — ABNORMAL HIGH (ref 70–99)

## 2023-12-14 LAB — CULTURE, BLOOD (ROUTINE X 2)
Culture: NO GROWTH
Culture: NO GROWTH
Special Requests: ADEQUATE

## 2023-12-14 MED ORDER — DEXTROSE 50 % IV SOLN
12.5000 g | Freq: Four times a day (QID) | INTRAVENOUS | Status: DC | PRN
  Administered 2023-12-14: 12.5 g via INTRAVENOUS

## 2023-12-14 MED ORDER — DEXTROSE 50 % IV SOLN
12.5000 g | INTRAVENOUS | Status: AC
  Administered 2023-12-14: 12.5 g via INTRAVENOUS

## 2023-12-14 MED ORDER — DEXTROSE 50 % IV SOLN
INTRAVENOUS | Status: AC
  Filled 2023-12-14: qty 50

## 2023-12-14 NOTE — Progress Notes (Signed)
 NEUROLOGY CONSULT FOLLOW UP NOTE   Date of service: Dec 14, 2023 Patient Name: Jacob Mcgrath. MRN:  [id number] DOB:  Jun 09, 1956  Jacob Mcgrath. is a 68 y.o. male with hx of dementia, diabetes, hypertension, previous stroke, seizures who presented to the [facility] emergency department on 12/08/2023 with status epilepticus.  He takes Keppra  750 twice daily at home.  He was given 8 mg of Ativan , as well as 4 g of Keppra  with no improvement.  He was therefore intubated and given propofol  as well as being loaded with Depacon .  Following initiation of the propofol , he appeared to have cessation of movements but no improvement in his mental status and therefore he has been transferred to [facility] for emergency evaluation of status epilepticus.   AEDs increased from home keppra  monotherapy to Depakote 750 mg BID in addition  Neurology asked to re-evaluate as primary team feels patient not at basline  07/29/23 presented with status as well and keppra  increased from 500 to 750 mg BID; also had c/f aspiration pneumonia vs CAP    Interval Hx/subjective  Seen in room. Hypoglycemic episode this morning, now improved. Mentation appears to be at baseline as he is oriented to self and that he is in the hospital.   Vitals   Vitals:   12/14/23 0100 12/14/23 0400 12/14/23 0715 12/14/23 0745  BP:    113/71  Pulse:    92  Resp:    (!) 21  Temp: 98.4 F (36.9 C) 98.2 F (36.8 C)  97.7 F (36.5 C)  TempSrc: Oral Oral  Axillary  SpO2:   99%   Weight:      Height:         Body mass index is 19.37 kg/m.  Physical Exam  General: NAD HEENT: Normocephalic/atraumatic Lungs: regular rate, on room air Neurological exam He is awake, nods yes and no appropriately. States name and that he is in the hospital Pupils equal round reactive to light Extraocular movements intact Blinks to threat from both sides Gaze incomplete to the left Face is symmetric  Left spastic  hemiparesis Generalized weakness on the right side with poor effort Sensation intact on the right-diminished on the left   Medications  Current Facility-Administered Medications:    acetaminophen  (TYLENOL ) tablet 1,000 mg, 1,000 mg, Oral, Q6H PRN, Mcgrath, Jacob B, DO   aspirin  chewable tablet 81 mg, 81 mg, Oral, Daily, Mcgrath, Jacob, RPH, 81 mg at 12/13/23 0920   atorvastatin  (LIPITOR ) tablet 80 mg, 80 mg, Oral, QHS, Mcgrath, Jacob K, MD, 80 mg at 12/13/23 2120   baclofen  (LIORESAL ) tablet 5 mg, 5 mg, Oral, TID, Mcgrath, Jacob, RPH, 5 mg at 12/13/23 2119   clopidogrel  (PLAVIX ) tablet 75 mg, 75 mg, Oral, Daily, Mcgrath, Jacob, RPH, 75 mg at 12/13/23 4098   dextrose  50 % solution 12.5 g, 12.5 g, Intravenous, QID PRN, Mcgrath, Jacob K, MD, 12.5 g at 12/14/23 0752   dextrose  50 % solution, , , ,    docusate (COLACE) 50 MG/5ML liquid 100 mg, 100 mg, Oral, BID, Mcgrath, Jacob, RPH, 100 mg at 12/13/23 2118   enoxaparin  (LOVENOX ) injection 40 mg, 40 mg, Subcutaneous, Daily, Jacob Pyo, NP, 40 mg at 12/12/23 1006   feeding supplement (ENSURE ENLIVE / ENSURE PLUS) liquid 237 mL, 237 mL, Oral, BID BM, Mcgrath, Jacob Kobs, MD, 237 mL at 12/13/23 1418   labetalol  (NORMODYNE ) injection 10 mg, 10 mg, Intravenous, Q10 min PRN, Jacob Pyo, NP, 10 mg at  12/09/23 1751   levETIRAcetam  (KEPPRA ) tablet 1,500 mg, 1,500 mg, Oral, BID, Mcgrath, Jacob, RPH, 1,500 mg at 12/13/23 2119   LORazepam  (ATIVAN ) injection 2 mg, 2 mg, Intravenous, Q5 Min x 3 PRN, Jacob Pyo, NP   metoprolol  tartrate (LOPRESSOR ) tablet 25 mg, 25 mg, Oral, BID, Mcgrath, Jacob, RPH, 25 mg at 12/13/23 2118   multivitamin with minerals tablet 1 tablet, 1 tablet, Oral, Daily, Mcgrath, Jacob, RPH, 1 tablet at 12/13/23 0920   Oral care mouth rinse, 15 mL, Mouth Rinse, Q2H, Aleck Hurdle, MD, 15 mL at 12/13/23 2200   Oral care mouth rinse, 15 mL, Mouth Rinse, PRN, Aleck Hurdle, MD   pantoprazole  (PROTONIX ) EC  tablet 40 mg, 40 mg, Oral, Daily, Mcgrath, Jacob K, MD, 40 mg at 12/13/23 0919   PARoxetine  (PAXIL ) tablet 40 mg, 40 mg, Oral, Daily, Mcgrath, Jacob, RPH, 40 mg at 12/13/23 0919   polyethylene glycol (MIRALAX  / GLYCOLAX ) packet 17 g, 17 g, Oral, BID, Mcgrath, Jacob, RPH, 17 g at 12/13/23 2118   tamsulosin  (FLOMAX ) capsule 0.4 mg, 0.4 mg, Oral, Daily, Mcgrath, Jacob K, MD, 0.4 mg at 12/13/23 0920   valproic  acid (DEPAKENE ) 250 MG/5ML solution 750 mg, 750 mg, Oral, BID, Mcgrath, Jacob, RPH, 750 mg at 12/13/23 2120  Labs and Diagnostic Imaging    Basic Metabolic Panel: Recent Labs  Lab 12/08/23 1904 12/08/23 1908 12/09/23 0500 12/10/23 0533 12/11/23 0420 12/12/23 0434 12/12/23 1459  NA 139   < > 139 137 142 142 144  K 3.8   < > 3.6 3.6 4.0 5.1 4.5  CL 104   < > 108 107 111 106 104  CO2 18*  --  22 22 25 27 27   GLUCOSE 140*   < > 106* 194* 161* 132* 131*  BUN 15   < > 13 13 14 19  27*  CREATININE 1.06   < > 1.01 0.98 0.94 0.99 1.33*  CALCIUM  9.0  --  8.4* 8.7* 8.7* 9.3 9.3  MG 1.7  --  1.5* 2.0 1.8 1.9  --   PHOS  --   --  3.3 2.6 2.4* 4.6  --    < > = values in this interval not displayed.    CBC: Recent Labs  Lab 12/08/23 1904 12/08/23 1908 12/08/23 2348 12/09/23 0500 12/10/23 0533 12/11/23 0420 12/12/23 0434  WBC 19.5*  --   --  15.2* 13.0* 12.7* 18.8*  NEUTROABS 15.1*  --   --   --   --   --   --   HGB 13.6   < > 12.9* 12.3* 12.5* 11.6* 12.6*  HCT 40.5   < > 38.0* 37.2* 38.0* 34.6* 39.1  MCV 89.0  --   --  88.4 88.0 87.6 90.1  PLT 286  --   --  260 264 235 272   < > = values in this interval not displayed.    Coagulation Studies: No results for input(s): "LABPROT", "INR" in the last 72 hours.    Lipid Panel:  Lab Results  Component Value Date   LDLCALC 40 10/05/2022   HgbA1c:  Lab Results  Component Value Date   HGBA1C 5.4 07/01/2023   Urine Drug Screen:     Component Value Date/Time   LABOPIA NONE DETECTED 07/01/2023 1127   COCAINSCRNUR  NONE DETECTED 07/01/2023 1127   LABBENZ NONE DETECTED 07/01/2023 1127   AMPHETMU NONE DETECTED 07/01/2023 1127   THCU NONE DETECTED 07/01/2023 1127   LABBARB NONE DETECTED  07/01/2023 1127    Alcohol Level     Component Value Date/Time   ETH <10 07/01/2023 1236   INR  Lab Results  Component Value Date   INR 1.3 (H) 07/02/2023   APTT  Lab Results  Component Value Date   APTT 32 07/02/2023   AED levels:  Lab Results  Component Value Date   LEVETIRACETA 18.8 12/08/2023   Component Ref Range & Units (hover) 5/18 5/16  Valproic  Acid Lvl 71 64 CM    CT Head without contrast(Personally reviewed): Extensive old right MCA infarct.  No acute findings  Overnight EEG: Cortical dysfunction in the right hemisphere likely secondary to underlying stroke or postictal state.  Moderate diffuse encephalopathy.  No seizures or epileptiform discharges.  Assessment   Jacob Mcgrath. is a 68 y.o. 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  Loaded with his home medication of Keppra  and added Depakote, emergently intubated and transferred for higher level of care at [facility].  Impression: Breakthrough seizure-resolved Status epilepticus-resolved Encephalopathy due to respiratory failure-resolving Prior stroke History of dementia Episodes of hypoglycemia-improved with tube feeds previously  Concern for failure to thrive , advancing dementia  Recommendations  -- Avoid hypoglycemia  -- Keppra  1500 twice daily, may need dose adjustment -- Depakote 750 twice daily,  -- Routine monitoring of LFTs and ammonia to confirm no toxicity (not completed today as phelotomy could not obtain blood, okay to hold off as patient returned to baseline with glucose correction) -- Seizure precautions  Neurology will sign off and be available as needed  Plan relayed to Dr. Zelda Mcgrath via secure chat   ______________________________________________________________________  We were reconsulted as primary team felt that the patient's mental status was not at baseline this morning.  This is likely secondary to his glucose of 56 in the morning and mental status appears to have improved with normalization of his glucose.   Would not change antiseizure medications at this time as clearly he was inadequately controlled on monotherapy and needs a second antiseizure medication  However unable to obtain labs as the patient is a hard stick.  Of note on my examination he is sitting with his lunch tray in front of him, not having eaten any of his lunch and reporting no appetite; otherwise he is conversant, oriented to self and hemiplegic on the left as previously described  Loss of interest in eating can be a sign of advancing dementia and if there is no other medical etiologies suspected, recommend continued goals of care discussion as a placement of feeding tubes in patients with dementia has not been shown to prolong life in patients in the [place]  and is therefore not recommended.  If he does remain a full code goals of care, he does need monitoring of his labs.  Keppra  may need to be adjusted if his renal function has worsened, and he certainly at risk of developing an acute kidney injury secondary to poor oral intake.  Additionally intermittent monitoring of ammonia and LFTs as well as CBC is required for Depakote safety monitoring although there is no urgency to do so given his exam is otherwise reassuring; typically monitored every few months or biannually on an outpatient basis.  Low concern for Depakote toxicity based on examination as well as reassuring Depakote level 3 days ago.   Neurology will sign off at this time, thank you  Baldwin Levee MD-PhD [facility] [phone] Available  7 AM to 7 PM, outside these hours please contact Neurologist on call listed on AMION

## 2023-12-14 NOTE — TOC Progression Note (Addendum)
 Transition of Care (TOC) - Progression Note    Patient Details  Name: Jacob Mcgrath. MRN: [id number] Date of Birth: 1956-01-01  Transition [facility]) CM/SW Contact  Jannice Mends, LCSW Phone Number: 12/14/2023, 11:12 AM  Clinical Narrative:    CSW provided update to [facility] on potential discharge tomorrow per MD.   CSW contacted patient's Legal Guardian with the [employer] [place], Halie, but voicemail is full. Also sent a HIPPA compliant text.   Halie responded and CSW made her aware of patient's potential discharge tomorrow.    Expected Discharge Plan: Skilled Nursing Facility Barriers to Discharge: Continued Medical Work up  Expected Discharge Plan and Services In-house Referral: Clinical Social Work     Living arrangements for the past 2 months: Skilled Nursing Facility                                       Social Determinants of Health (SDOH) Interventions SDOH Screenings   Food Insecurity: Patient Unable To Answer (12/09/2023)  Housing: Patient Unable To Answer (12/09/2023)  Transportation Needs: Patient Unable To Answer (12/09/2023)  Utilities: Patient Unable To Answer (12/09/2023)  Social Connections: Patient Unable To Answer (12/09/2023)  Tobacco Use: High Risk (12/08/2023)    Readmission Risk Interventions    08/01/2023    3:31 PM 03/01/2023    2:40 PM  Readmission Risk Prevention Plan  Transportation Screening Complete Complete  PCP or Specialist Appt within 5-7 Days Complete Not Complete  Home Care Screening Complete Complete  Medication Review (RN CM) Complete Complete

## 2023-12-14 NOTE — Progress Notes (Signed)
 PROGRESS NOTE                                                                                                                                                                                                             Patient Demographics:    Jacob Mcgrath, is a 68 y.o. male, DOB - Aug 19, 1955, ELF:[id number]  Outpatient Primary MD for the patient is Wyvonna Heidelberg, MD    LOS - 6  Admit date - 12/08/2023    Chief Complaint  Patient presents with   Seizures       Brief Narrative (HPI from H&P)   68 year old male with past medical history as below, which is significant for dementia, diabetes, stroke with left-sided weakness, and recent diagnosis of seizure in December 2024. He was admitted in January with status epilepticus requiring intubation in January of this year. Course was complicated by aspiration pneumonia. He was discharged on Keppra . He resides in some sort of facility and has a legal guardian. He presented to [facility] emergency department 5/15 after having seizure witnessed by his roommate. He did wake up after the seizure but only briefly and then had another seizure. Facility reports he has been compliant with Keppra . Upon EMS arrival he had been seizing for approximately 25 minutes and was given IM Versed  in the field without improvement. Upon arrival to [facility] he had active seizure. 8 total milligrams of IV Ativan  were given without improvement. He was intubated for airway protection. Generalized seizure improved after intubation with continuous propofol  infusion but twitches were still observed.   The patient was transferred to [facility] for continuous EEG monitoring and neurology evaluation.  He was admitted by the ICU team, seen by neurology, stabilized, extubated and transferred to my care on 12/13/2023 on day 5 of his hospital stay, still quite weak and frail, still has dysphagia with speech and PT  following.   Significant Hospital Events: Including procedures, antibiotic start and stop dates in addition to other pertinent events   5/15 admitted for status epilepticus. Intubated.  5/18 extubated   Subjective:   Seen in bed, mildly confused but denies any headache chest or abdominal pain.  No subjective complaints this morning.   Assessment  & Plan :    Status epilepticus Acute metabolic encephalopathy Hx CVA, residual L sided def  /  spasticity   CT head non-acute. Old stroke likely etiology for bthru sz now on combination of Depakene  and Keppra , currently appears to be seizure-free, no headache or new focal deficits.  Seen by neuro and they have signed off.  Continue PT, OT and speech therapy.  Advance activity.  He is still encephalopathic likely metabolic or hospital-acquired delirium, will request neurology to take a peek at him today to make sure seizure medications are not contributing to it.  Dementia ? Mood disorder  -on home paxil , baclofen     Acute hypoxic resp failure P: -extubated, continue chest PT, I-S and flutter valve, currently on room air, monitor.  Still has high risk for aspiration.   Hypertension P: - BID metop  - PRN labetalol     Dysphagia P: -SLP following - Currently on oral diet, core track removed   AKI.  Resume home Flomax  for BPH, monitor bladder scans, avoid nephrotoxins, repeat BMP today.    DM2 w hypoglycemia (improved) P: - CBG monitoring and SSI     Lab Results  Component Value Date   HGBA1C 5.4 07/01/2023   CBG (last 3)  Recent Labs    12/14/23 0435 12/14/23 0547 12/14/23 0746  GLUCAP 56* 81 56*         Condition -  Guarded  Family Communication  : Called daughter on the listed phone number [phone] on 12/14/2023 at 7:45 AM.  Phone switched off.  Code Status :  Full  Consults  :  PCCM, Neuro  PUD Prophylaxis :  PPI   Procedures  :            Disposition Plan  :    Status is: Inpatient   DVT  Prophylaxis  :    enoxaparin  (LOVENOX ) injection 40 mg Start: 12/09/23 1000     Lab Results  Component Value Date   PLT 272 12/12/2023    Diet :  Diet Order             DIET DYS 2 Room service appropriate? Yes; Fluid consistency: Thin  Diet effective now                    Inpatient Medications  Scheduled Meds:  aspirin   81 mg Oral Daily   atorvastatin   80 mg Oral QHS   baclofen   5 mg Oral TID   clopidogrel   75 mg Oral Daily   dextrose        docusate  100 mg Oral BID   enoxaparin  (LOVENOX ) injection  40 mg Subcutaneous Daily   feeding supplement  237 mL Oral BID BM   levETIRAcetam   1,500 mg Oral BID   metoprolol  tartrate  25 mg Oral BID   multivitamin with minerals  1 tablet Oral Daily   mouth rinse  15 mL Mouth Rinse Q2H   pantoprazole   40 mg Oral Daily   PARoxetine   40 mg Oral Daily   polyethylene glycol  17 g Oral BID   tamsulosin   0.4 mg Oral Daily   valproic  acid  750 mg Oral BID   Continuous Infusions: PRN Meds:.acetaminophen , dextrose , dextrose , labetalol , LORazepam , mouth rinse     Objective:   Vitals:   12/14/23 0100 12/14/23 0400 12/14/23 0715 12/14/23 0745  BP:    113/71  Pulse:    92  Resp:    (!) 21  Temp: 98.4 F (36.9 C) 98.2 F (36.8 C)  97.7 F (36.5 C)  TempSrc: Oral Oral  Axillary  SpO2:  99%   Weight:      Height:        Wt Readings from Last 3 Encounters:  12/12/23 59.5 kg  08/04/23 60 kg  07/05/23 59.2 kg     Intake/Output Summary (Last 24 hours) at 12/14/2023 0749 Last data filed at 12/14/2023 0715 Gross per 24 hour  Intake 240 ml  Output 1300 ml  Net -1060 ml     Physical Exam  Awake but somewhat confused, elderly African-American male, overall appears weak and extremely deconditioned, in bed, No new F.N deficits,   [place].AT,PERRAL Supple Neck, No JVD,   Symmetrical Chest wall movement, Good air movement bilaterally, CTAB RRR,No Gallops,Rubs or new Murmurs,  +ve B.Sounds, Abd Soft, No tenderness,   No  Cyanosis, Clubbing or edema     RN pressure injury documentation: Pressure Injury 10/04/22 Sacrum Medial Stage 1 -  Intact skin with non-blanchable redness of a localized area usually over a bony prominence. (Active)  10/04/22 2011  Location: Sacrum  Location Orientation: Medial  Staging: Stage 1 -  Intact skin with non-blanchable redness of a localized area usually over a bony prominence.  Wound Description (Comments):   Present on Admission:       Data Review:    Recent Labs  Lab 12/08/23 1904 12/08/23 1908 12/08/23 2348 12/09/23 0500 12/10/23 0533 12/11/23 0420 12/12/23 0434  WBC 19.5*  --   --  15.2* 13.0* 12.7* 18.8*  HGB 13.6   < > 12.9* 12.3* 12.5* 11.6* 12.6*  HCT 40.5   < > 38.0* 37.2* 38.0* 34.6* 39.1  PLT 286  --   --  260 264 235 272  MCV 89.0  --   --  88.4 88.0 87.6 90.1  MCH 29.9  --   --  29.2 28.9 29.4 29.0  MCHC 33.6  --   --  33.1 32.9 33.5 32.2  RDW 13.4  --   --  13.3 13.5 13.6 13.5  LYMPHSABS 2.4  --   --   --   --   --   --   MONOABS 1.6*  --   --   --   --   --   --   EOSABS 0.2  --   --   --   --   --   --   BASOSABS 0.0  --   --   --   --   --   --    < > = values in this interval not displayed.    Recent Labs  Lab 12/08/23 1904 12/08/23 1908 12/09/23 0500 12/09/23 1538 12/10/23 0533 12/11/23 0420 12/12/23 0434 12/12/23 1459  NA 139   < > 139  --  137 142 142 144  K 3.8   < > 3.6  --  3.6 4.0 5.1 4.5  CL 104   < > 108  --  107 111 106 104  CO2 18*  --  22  --  22 25 27 27   ANIONGAP 17*  --  9  --  8 6 9 13   GLUCOSE 140*   < > 106*  --  194* 161* 132* 131*  BUN 15   < > 13  --  13 14 19  27*  CREATININE 1.06   < > 1.01  --  0.98 0.94 0.99 1.33*  AST 27  --   --   --   --   --   --   --   ALT 22  --   --   --   --   --   --   --  ALKPHOS 92  --   --   --   --   --   --   --   BILITOT 0.7  --   --   --   --   --   --   --   ALBUMIN 3.8  --   --   --   --   --   --   --   PROCALCITON  --   --   --  0.23  --   --   --   --   AMMONIA   --   --   --  19  --   --   --   --   MG 1.7  --  1.5*  --  2.0 1.8 1.9  --   PHOS  --   --  3.3  --  2.6 2.4* 4.6  --   CALCIUM  9.0  --  8.4*  --  8.7* 8.7* 9.3 9.3   < > = values in this interval not displayed.      Recent Labs  Lab 12/08/23 1904 12/09/23 0500 12/09/23 1538 12/10/23 0533 12/11/23 0420 12/12/23 0434 12/12/23 1459  PROCALCITON  --   --  0.23  --   --   --   --   AMMONIA  --   --  19  --   --   --   --   MG 1.7 1.5*  --  2.0 1.8 1.9  --   CALCIUM  9.0 8.4*  --  8.7* 8.7* 9.3 9.3    --------------------------------------------------------------------------------------------------------------- Lab Results  Component Value Date   CHOL 77 10/05/2022   HDL 29 (L) 10/05/2022   LDLCALC 40 10/05/2022   TRIG 49 12/12/2023   CHOLHDL 2.7 10/05/2022    Lab Results  Component Value Date   HGBA1C 5.4 07/01/2023   No results for input(s): "TSH", "T4TOTAL", "FREET4", "T3FREE", "THYROIDAB" in the last 72 hours. No results for input(s): "VITAMINB12", "FOLATE", "FERRITIN", "TIBC", "IRON", "RETICCTPCT" in the last 72 hours. ------------------------------------------------------------------------------------------------------------------ Cardiac Enzymes No results for input(s): "CKMB", "TROPONINI", "MYOGLOBIN" in the last 168 hours.  Invalid input(s): "CK"  Micro Results Recent Results (from the past 240 hours)  Blood culture (routine x 2)     Status: None   Collection Time: 12/08/23  8:27 PM   Specimen: BLOOD  Result Value Ref Range Status   Specimen Description BLOOD BLOOD RIGHT ARM  Final   Special Requests   Final    BOTTLES DRAWN AEROBIC ONLY Blood Culture results may not be optimal due to an inadequate volume of blood received in culture bottles   Culture   Final    NO GROWTH 6 DAYS Performed at [facility], [address], [place], [place] [address]    Report Status 12/14/2023 FINAL  Final  Blood culture (routine x 2)     Status: None   Collection Time:  12/08/23  9:50 PM   Specimen: BLOOD  Result Value Ref Range Status   Specimen Description BLOOD CENTRAL LINE  Final   Special Requests   Final    BOTTLES DRAWN AEROBIC AND ANAEROBIC Blood Culture adequate volume   Culture   Final    NO GROWTH 6 DAYS Performed at [facility], [address] [place], [place] [address]    Report Status 12/14/2023 FINAL  Final  MRSA Next Gen by PCR, Nasal     Status: Abnormal   Collection Time: 12/08/23 11:50 PM   Specimen: Nasal Mucosa;  Nasal Swab  Result Value Ref Range Status   MRSA by PCR Next Gen DETECTED (A) NOT DETECTED Final    Comment: RESULT CALLED TO, READ BACK BY AND VERIFIED WITH: L TZINTZUN RN 12/09/2023 @ 0246 BY AB (NOTE) The GeneXpert MRSA Assay (FDA approved for NASAL specimens only), is one component of a comprehensive MRSA colonization surveillance program. It is not intended to diagnose MRSA infection nor to guide or monitor treatment for MRSA infections. Test performance is not FDA approved in patients less than 45 years old. Performed at [facility], [address] [place], [place] [address]     Radiology Report DG Chest Port 1 View Result Date: 12/13/2023 CLINICAL DATA:  Shortness of breath EXAM: PORTABLE CHEST 1 VIEW COMPARISON:  12/09/2023 FINDINGS: Stable cardiomediastinal contours. Aortic atherosclerotic calcifications. Interval removal of ET tube and enteric tube. Decreased lung volumes with persistent bilateral lower lobe atelectasis. No signs of pleural effusion or interstitial edema. IMPRESSION: 1. Interval removal of ET tube and enteric tube. 2. Decreased lung volumes with persistent bilateral lower lobe atelectasis. Electronically Signed   By: Kimberley Penman M.D.   On: 12/13/2023 06:43   DG Swallowing Func-Speech Pathology Result Date: 12/12/2023 Table formatting from the original result was not included. Modified Barium Swallow Study Patient Details Name: Jacob Mcgrath. MRN: [id number] Date of Birth:  [birth year] Today's Date: 12/12/2023 HPI/PMH: HPI: Baruch Lewers is a 68 year old male who resides in facility and has a legal guardian, and presented to [facility] emergency department 5/15 after having seizure witnessed by his roommate which lasted 25 minutes prior to EMS arrival.  Pt required ETT 5/15-18 for airway protection.  Pt with past medical history which is significant for dementia, diabetes, stroke with left-sided weakness, and recent diagnosis of seizure in December 2024. He was admitted in January with status epilepticus requiring intubation in January of this year. Course was complicated by aspiration pneumonia. Seen by speech with clinical management of dyspahgia and placed on regular diet with thin liquids. 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  These deficits resulted in intermittent oral holding of bolus trials, prolonged oral phase, delayed swallow initation intermittently to the pyriform sinuses, and oral and vallecula residue.  There a single throat clear observed during study which at time time was suspected to be 2/2 penetration of residuals, but could not be found on repeat viewing suggesting throat clear in absence of penetration/aspiration.  Pt benefited from cues to take multiple swallows to decrease oral and pharyngeal residue, but was able to adequately protect airway throughout today's study.  Pt should be able to advance diet clinically.  Oral holding was noted for longer periods of time with liquid than with solid trials, and pt benefitted from verbal cues to swallow.  With pill simulation, pt prefered to swallow tablet with puree.  Pt was able acheive oral clearance and on esopahgeal sweep there was only trace-mild retention of contrast in distal esophagus.   Recommend chopped/ground (dysphagia 2) diet with thin liquids. DIGEST Swallow Severity Rating*  Safety: 0  Efficiency: 1  Overall Pharyngeal Swallow Severity: 1 1: mild; 2: moderate; 3: severe; 4: profound *The Dynamic Imaging Grade of Swallowing Toxicity is standardized for the head and neck cancer population, however, demonstrates promising clinical applications across populations to standardize the clinical rating of pharyngeal  swallow safety and severity. Factors that may increase risk of adverse event in presence of aspiration Roderick Civatte & Jessy Morocco 2021): Reduced cognitive function Swallow Evaluation Recommendations Recommendations: PO diet PO Diet Recommendation: Dysphagia 2 (Finely chopped) Liquid Administration via: Cup;Straw Medication Administration: Whole meds with puree Supervision: Staff to assist with self-feeding;Intermittent supervision/cueing for swallowing strategies (PRN) Swallowing strategies  : Slow rate; Small bites/sips; Multiple dry swallows after each bite/sip; Check for pocketing or oral holding; verbal cues to swallow Postural changes: Position pt fully upright for meals Oral care recommendations: Oral care BID (2x/day) Treatment Plan Treatment Plan Treatment recommendations: Therapy as outlined in treatment plan below Follow-up recommendations: -- (ST at next level of care at present) Functional status assessment: Patient has had a recent decline in their functional status and demonstrates the ability to make significant improvements in function in a reasonable and predictable amount of time. Treatment frequency: Min 2x/week Treatment duration: 2 weeks Interventions: Aspiration precaution training; Trials of upgraded texture/liquids; Diet toleration management by SLP Recommendations Recommendations for follow up therapy are one component of a multi-disciplinary discharge planning process, led by the attending physician.  Recommendations may be updated based on patient status, additional  functional criteria and insurance authorization. Assessment: Orofacial Exam: Orofacial Exam Oral Cavity: Oral Hygiene: WFL Oral Cavity - Dentition: Edentulous; Dentures, not available Orofacial Anatomy: WFL Oral Motor/Sensory Function: -- (See BSE) Anatomy: No data recorded Boluses Administered: Boluses Administered Boluses Administered: Thin liquids (Level 0); Mildly thick liquids (Level 2, nectar thick); Moderately thick liquids (Level 3, honey thick); Puree; Solid  Oral Impairment Domain: Oral Impairment Domain Lip Closure: Escape progressing to mid-chin Tongue control during bolus hold: Cohesive bolus between tongue to palatal seal (premature spillage at times, suspect inattention) Bolus preparation/mastication: Slow prolonged chewing/mashing with complete recollection Bolus transport/lingual motion: Delayed initiation of tongue motion (oral holding) Oral residue: Residue collection on oral structures Location of oral residue : Tongue; Floor of mouth Initiation of pharyngeal swallow : Pyriform sinuses; Posterior angle of the ramus  Pharyngeal Impairment Domain: Pharyngeal Impairment Domain Soft palate elevation: No bolus between soft palate (SP)/pharyngeal wall (PW) Laryngeal elevation: Complete superior movement of thyroid cartilage with complete approximation of arytenoids to epiglottic petiole Anterior hyoid excursion: Complete anterior movement Epiglottic movement: Complete inversion Laryngeal vestibule closure: Complete, no air/contrast in laryngeal vestibule Pharyngeal stripping wave : Present - complete Pharyngeal contraction (A/P view only): N/A Pharyngoesophageal segment opening: Complete distension and complete duration, no obstruction of flow Tongue base retraction: Trace column of contrast or air between tongue base and PPW Pharyngeal residue: Collection of residue within or on pharyngeal structures Location of pharyngeal residue: Valleculae; Pyriform sinuses  Esophageal Impairment Domain: Esophageal  Impairment Domain Esophageal clearance upright position: Esophageal retention (mild) Pill: Pill Consistency administered: Puree Puree: WFL Penetration/Aspiration Scale Score: Penetration/Aspiration Scale Score 1.  Material does not enter airway: Thin liquids (Level 0); Moderately thick liquids (Level 3, honey thick); Mildly thick liquids (Level 2, nectar thick); Pill; Solid; Puree Compensatory Strategies: Compensatory Strategies Compensatory strategies: Yes Multiple swallows: Effective Effective Multiple Swallows: Puree; Pill   General Information: No data recorded Diet Prior to this Study: NPO   No data recorded  No data recorded  No data recorded  No data recorded Behavior/Cognition: Alert; Cooperative; Pleasant mood No data recorded No data recorded No data recorded Volitional Swallow: Able to elicit Exam Limitations: No limitations Goal Planning: Prognosis for improved oropharyngeal function: Good No data recorded No data recorded Patient/Family Stated Goal: not stated Consulted and agree with results and  recommendations: Patient; Nurse Pain: Pain Assessment Breathing: 0 Negative Vocalization: 0 Facial Expression: 0 Body Language: 0 Consolability: 0 PAINAD Score: 0 Facial Expression: 0 Body Movements: 0 Muscle Tension: 0 Compliance with ventilator (intubated pts.): 0 Vocalization (extubated pts.): N/A CPOT Total: 0 End of Session: Start Time:SLP Start Time (ACUTE ONLY): 0935 Stop Time: SLP Stop Time (ACUTE ONLY): 0954 Time Calculation:SLP Time Calculation (min) (ACUTE ONLY): 19 min Charges: SLP Evaluations $ SLP Speech Visit: 1 Visit SLP Evaluations $BSS Swallow: 1 Procedure $MBS Swallow: 1 Procedure SLP visit diagnosis: SLP Visit Diagnosis: Dysphagia, oropharyngeal phase (R13.12) Past Medical History: Past Medical History: Diagnosis Date  Dementia (HCC)   Diabetes mellitus without complication (HCC)   Hypertension   Stroke [facility])  Past Surgical History: Past Surgical History: Procedure Laterality Date  ABDOMINAL  AORTOGRAM W/LOWER EXTREMITY N/A 10/04/2022  Procedure: ABDOMINAL AORTOGRAM W/LOWER EXTREMITY;  Surgeon: Dannis Dy, MD;  Location: [facility] INVASIVE CV LAB;  Service: Cardiovascular;  Laterality: N/A;  ABDOMINAL AORTOGRAM W/LOWER EXTREMITY N/A 10/08/2022  Procedure: ABDOMINAL AORTOGRAM W/LOWER EXTREMITY;  Surgeon: Carlene Che, MD;  Location: [facility] INVASIVE CV LAB;  Service: Cardiovascular;  Laterality: N/A;  PERIPHERAL VASCULAR INTERVENTION  10/04/2022  Procedure: PERIPHERAL VASCULAR INTERVENTION;  Surgeon: Dannis Dy, MD;  Location: [facility] INVASIVE CV LAB;  Service: Cardiovascular;;  PERIPHERAL VASCULAR INTERVENTION Left 10/08/2022  Procedure: PERIPHERAL VASCULAR INTERVENTION;  Surgeon: Carlene Che, MD;  Location: [facility] INVASIVE CV LAB;  Service: Cardiovascular;  Laterality: Left;  TONSILLECTOMY   [name], MA, CCC-SLP Acute Rehabilitation Services Office: [phone] 12/12/2023, 10:47 AM    Signature  -   Lynnwood Sauer M.D on 12/14/2023 at 7:49 AM   -  To page go to [email or web address]

## 2023-12-14 NOTE — Progress Notes (Addendum)
 IVT consult placed - spoke with primary RN who explained that patient is a difficult stick and phlebotomy has had issues getting lab work. Patient needs depakote levels prior to receiving medications. Explained that IVT doesn't place PIV's for labs, MD requesting a midline. Explained that a midline may not be the most appropriate line given the inconsistencies with blood return. Dr. Zelda Hickman to place PICC order.   EDIT - lab orders being cancelled. No need for further access at this time.  Ariauna Farabee R Altan Kraai, RN

## 2023-12-15 DIAGNOSIS — G40901 Epilepsy, unspecified, not intractable, with status epilepticus: Secondary | ICD-10-CM | POA: Diagnosis not present

## 2023-12-15 LAB — GLUCOSE, CAPILLARY
Glucose-Capillary: 144 mg/dL — ABNORMAL HIGH (ref 70–99)
Glucose-Capillary: 129 mg/dL — ABNORMAL HIGH (ref 70–99)
Glucose-Capillary: 164 mg/dL — ABNORMAL HIGH (ref 70–99)
Glucose-Capillary: 133 mg/dL — ABNORMAL HIGH (ref 70–99)
Glucose-Capillary: 122 mg/dL — ABNORMAL HIGH (ref 70–99)

## 2023-12-15 NOTE — Progress Notes (Signed)
 Speech Language Pathology Treatment: Dysphagia  Patient Details Name: Jacob Mcgrath. MRN: [id number] DOB: 11/14/1955 Today's Date: 12/15/2023 Time: 7829-5621 SLP Time Calculation (min) (ACUTE ONLY): 9 min  Assessment / Plan / Recommendation Clinical Impression  Pt seen for ongoing dysphagia management.  RN reports excellent tolerance of POs.  Pt repositioned in bed to sit upright.  Pt tolerated all consistencies trialed with no clinical s/s of aspiration.  Pt achieved good oral clearance of solid textures.  There was prolonged oral phase with regular texture solid v soft solid.  Discussed with pt who is agreeable to softer diet for ease of intake, although it would be safe for pt to advance to regular solids if desired.  Recommend mechanical soft diet with thin liquids.    HPI HPI: Jacob Mcgrath is a 68 year old male who resides in facility and has a legal guardian, and presented to [facility] emergency department 5/15 after having seizure witnessed by his roommate which lasted 25 minutes prior to EMS arrival.  Pt required ETT 5/15-18 for airway protection.  Pt with past medical history which is significant for dementia, diabetes, stroke with left-sided weakness, and recent diagnosis of seizure in December 2024. He was admitted in January with status epilepticus requiring intubation in January of this year. Course was complicated by aspiration pneumonia. Seen by speech with clinical management of dyspahgia and placed on regular diet with thin liquids.      SLP Plan  Continue with current plan of care      Recommendations for follow up therapy are one component of a multi-disciplinary discharge planning process, led by the attending physician.  Recommendations may be updated based on patient status, additional functional criteria and insurance authorization.    Recommendations  Diet recommendations: Dysphagia 3 (mechanical soft);Thin liquid Liquids provided via: Cup;Straw Medication  Administration: Whole meds with liquid Supervision: Staff to assist with self feeding Compensations: Slow rate;Small sips/bites Postural Changes and/or Swallow Maneuvers: Seated upright 90 degrees                  Oral care BID     Dysphagia, oropharyngeal phase (R13.12)     Continue with current plan of care     Jacob Grim, MA, CCC-SLP Acute Rehabilitation Services Office: [phone] 12/15/2023, 10:00 AM

## 2023-12-15 NOTE — Plan of Care (Signed)
  Problem: Clinical Measurements: Goal: Ability to maintain clinical measurements within normal limits will improve Outcome: Progressing Goal: Will remain free from infection Outcome: Progressing Goal: Diagnostic test results will improve Outcome: Progressing   Problem: Nutrition: Goal: Adequate nutrition will be maintained Outcome: Progressing   Problem: Elimination: Goal: Will not experience complications related to urinary retention Outcome: Progressing   Problem: Safety: Goal: Ability to remain free from injury will improve Outcome: Progressing

## 2023-12-15 NOTE — Progress Notes (Signed)
 PROGRESS NOTE                                                                                                                                                                                                             Patient Demographics:    Jacob Mcgrath, is a 68 y.o. male, DOB - 27-Jan-1956, RUE:[id number]  Outpatient Primary MD for the patient is Wyvonna Heidelberg, MD    LOS - 7  Admit date - 12/08/2023    Chief Complaint  Patient presents with   Seizures       Brief Narrative (HPI from H&P)   68 year old male with past medical history as below, which is significant for dementia, diabetes, stroke with left-sided weakness, and recent diagnosis of seizure in December 2024. He was admitted in January with status epilepticus requiring intubation in January of this year. Course was complicated by aspiration pneumonia. He was discharged on Keppra . He resides in some sort of facility and has a legal guardian. He presented to [facility] emergency department 5/15 after having seizure witnessed by his roommate. He did wake up after the seizure but only briefly and then had another seizure. Facility reports he has been compliant with Keppra . Upon EMS arrival he had been seizing for approximately 25 minutes and was given IM Versed  in the field without improvement. Upon arrival to [facility] he had active seizure. 8 total milligrams of IV Ativan  were given without improvement. He was intubated for airway protection. Generalized seizure improved after intubation with continuous propofol  infusion but twitches were still observed.   The patient was transferred to [facility] for continuous EEG monitoring and neurology evaluation.  He was admitted by the ICU team, seen by neurology, stabilized, extubated and transferred to my care on 12/13/2023 on day 5 of his hospital stay, still quite weak and frail, still has dysphagia with speech and PT  following.   Significant Hospital Events: Including procedures, antibiotic start and stop dates in addition to other pertinent events   5/15 admitted for status epilepticus. Intubated.  5/18 extubated   Subjective:   Patient in bed, appears comfortable, denies any headache, no fever, no chest pain or pressure, no shortness of breath , no abdominal pain. No focal weakness.   Assessment  & Plan :    Status epilepticus Acute metabolic encephalopathy  Hx CVA, residual L sided def  / spasticity   CT head non-acute. Old stroke likely etiology for bthru sz now on combination of Depakene  and Keppra , currently appears to be seizure-free, no headache or new focal deficits.  Seen by neuro and they have signed off.  Continue PT, OT and speech therapy.  Advance activity.  Fully improving encephalopathy, likely mild hospital-acquired delirium, continue DAPT treatment if stable discharge back to SNF on 12/16/2023.  Dementia ? Mood disorder  -on home paxil , baclofen     Acute hypoxic resp failure P: -extubated, continue chest PT, I-S and flutter valve, currently on room air, monitor.  Still has high risk for aspiration.   Hypertension P: - BID metop  - PRN labetalol     Dysphagia P: -SLP following - Currently on oral diet, core track removed   AKI.  Resume home Flomax  for BPH, monitor bladder scans, avoid nephrotoxins, repeat BMP today.    DM2 w hypoglycemia (improved) P: - CBG monitoring and SSI     Lab Results  Component Value Date   HGBA1C 5.4 07/01/2023   CBG (last 3)  Recent Labs    12/14/23 2000 12/15/23 0554 12/15/23 0831  GLUCAP 123* 144* 129*         Condition -  Guarded  Family Communication  : Called daughter on the listed phone number [phone] on 12/14/2023 at 7:45 AM.  Phone switched off.  Code Status :  Full  Consults  :  PCCM, Neuro  PUD Prophylaxis :  PPI   Procedures  :            Disposition Plan  :    Status is: Inpatient   DVT  Prophylaxis  :    enoxaparin  (LOVENOX ) injection 40 mg Start: 12/09/23 1000     Lab Results  Component Value Date   PLT 272 12/12/2023    Diet :  Diet Order             DIET DYS 3 Room service appropriate? Yes; Fluid consistency: Thin  Diet effective now                    Inpatient Medications  Scheduled Meds:  aspirin   81 mg Oral Daily   atorvastatin   80 mg Oral QHS   baclofen   5 mg Oral TID   clopidogrel   75 mg Oral Daily   docusate  100 mg Oral BID   enoxaparin  (LOVENOX ) injection  40 mg Subcutaneous Daily   feeding supplement  237 mL Oral BID BM   levETIRAcetam   1,500 mg Oral BID   metoprolol  tartrate  25 mg Oral BID   multivitamin with minerals  1 tablet Oral Daily   pantoprazole   40 mg Oral Daily   PARoxetine   40 mg Oral Daily   polyethylene glycol  17 g Oral BID   tamsulosin   0.4 mg Oral Daily   valproic  acid  750 mg Oral BID   Continuous Infusions: PRN Meds:.acetaminophen , dextrose , labetalol , LORazepam      Objective:   Vitals:   12/15/23 0400 12/15/23 0550 12/15/23 0831 12/15/23 0940  BP: 127/88  123/87 123/87  Pulse: 91  93 95  Resp: 17 (!) 21 18   Temp: 98.5 F (36.9 C)  98.1 F (36.7 C)   TempSrc: Axillary  Axillary   SpO2:   100%   Weight:  62.3 kg    Height:        Wt Readings from Last 3 Encounters:  12/15/23  62.3 kg  08/04/23 60 kg  07/05/23 59.2 kg     Intake/Output Summary (Last 24 hours) at 12/15/2023 1054 Last data filed at 12/15/2023 0715 Gross per 24 hour  Intake 240 ml  Output 500 ml  Net -260 ml     Physical Exam  Awake but minimally confused, oriented x 1, elderly African-American male, overall appears weak and extremely deconditioned, in bed, No new F.N deficits,   .AT,PERRAL Supple Neck, No JVD,   Symmetrical Chest wall movement, Good air movement bilaterally, CTAB RRR,No Gallops,Rubs or new Murmurs,  +ve B.Sounds, Abd Soft, No tenderness,   No Cyanosis, Clubbing or edema     RN pressure injury  documentation: Pressure Injury 10/04/22 Sacrum Medial Stage 1 -  Intact skin with non-blanchable redness of a localized area usually over a bony prominence. (Active)  10/04/22 2011  Location: Sacrum  Location Orientation: Medial  Staging: Stage 1 -  Intact skin with non-blanchable redness of a localized area usually over a bony prominence.  Wound Description (Comments):   Present on Admission:       Data Review:    Recent Labs  Lab 12/08/23 1904 12/08/23 1908 12/08/23 2348 12/09/23 0500 12/10/23 0533 12/11/23 0420 12/12/23 0434  WBC 19.5*  --   --  15.2* 13.0* 12.7* 18.8*  HGB 13.6   < > 12.9* 12.3* 12.5* 11.6* 12.6*  HCT 40.5   < > 38.0* 37.2* 38.0* 34.6* 39.1  PLT 286  --   --  260 264 235 272  MCV 89.0  --   --  88.4 88.0 87.6 90.1  MCH 29.9  --   --  29.2 28.9 29.4 29.0  MCHC 33.6  --   --  33.1 32.9 33.5 32.2  RDW 13.4  --   --  13.3 13.5 13.6 13.5  LYMPHSABS 2.4  --   --   --   --   --   --   MONOABS 1.6*  --   --   --   --   --   --   EOSABS 0.2  --   --   --   --   --   --   BASOSABS 0.0  --   --   --   --   --   --    < > = values in this interval not displayed.    Recent Labs  Lab 12/08/23 1904 12/08/23 1908 12/09/23 0500 12/09/23 1538 12/10/23 0533 12/11/23 0420 12/12/23 0434 12/12/23 1459  NA 139   < > 139  --  137 142 142 144  K 3.8   < > 3.6  --  3.6 4.0 5.1 4.5  CL 104   < > 108  --  107 111 106 104  CO2 18*  --  22  --  22 25 27 27   ANIONGAP 17*  --  9  --  8 6 9 13   GLUCOSE 140*   < > 106*  --  194* 161* 132* 131*  BUN 15   < > 13  --  13 14 19  27*  CREATININE 1.06   < > 1.01  --  0.98 0.94 0.99 1.33*  AST 27  --   --   --   --   --   --   --   ALT 22  --   --   --   --   --   --   --   ALKPHOS 92  --   --   --   --   --   --   --  BILITOT 0.7  --   --   --   --   --   --   --   ALBUMIN 3.8  --   --   --   --   --   --   --   PROCALCITON  --   --   --  0.23  --   --   --   --   AMMONIA  --   --   --  19  --   --   --   --   MG 1.7  --   1.5*  --  2.0 1.8 1.9  --   PHOS  --   --  3.3  --  2.6 2.4* 4.6  --   CALCIUM  9.0  --  8.4*  --  8.7* 8.7* 9.3 9.3   < > = values in this interval not displayed.      Recent Labs  Lab 12/08/23 1904 12/09/23 0500 12/09/23 1538 12/10/23 0533 12/11/23 0420 12/12/23 0434 12/12/23 1459  PROCALCITON  --   --  0.23  --   --   --   --   AMMONIA  --   --  19  --   --   --   --   MG 1.7 1.5*  --  2.0 1.8 1.9  --   CALCIUM  9.0 8.4*  --  8.7* 8.7* 9.3 9.3    --------------------------------------------------------------------------------------------------------------- Lab Results  Component Value Date   CHOL 77 10/05/2022   HDL 29 (L) 10/05/2022   LDLCALC 40 10/05/2022   TRIG 49 12/12/2023   CHOLHDL 2.7 10/05/2022    Lab Results  Component Value Date   HGBA1C 5.4 07/01/2023   No results for input(s): "TSH", "T4TOTAL", "FREET4", "T3FREE", "THYROIDAB" in the last 72 hours. No results for input(s): "VITAMINB12", "FOLATE", "FERRITIN", "TIBC", "IRON", "RETICCTPCT" in the last 72 hours. ------------------------------------------------------------------------------------------------------------------ Cardiac Enzymes No results for input(s): "CKMB", "TROPONINI", "MYOGLOBIN" in the last 168 hours.  Invalid input(s): "CK"  Micro Results Recent Results (from the past 240 hours)  Blood culture (routine x 2)     Status: None   Collection Time: 12/08/23  8:27 PM   Specimen: BLOOD  Result Value Ref Range Status   Specimen Description BLOOD BLOOD RIGHT ARM  Final   Special Requests   Final    BOTTLES DRAWN AEROBIC ONLY Blood Culture results may not be optimal due to an inadequate volume of blood received in culture bottles   Culture   Final    NO GROWTH 6 DAYS Performed at [facility], [address], [place], [place] [address]    Report Status 12/14/2023 FINAL  Final  Blood culture (routine x 2)     Status: None   Collection Time: 12/08/23  9:50 PM   Specimen: BLOOD  Result Value  Ref Range Status   Specimen Description BLOOD CENTRAL LINE  Final   Special Requests   Final    BOTTLES DRAWN AEROBIC AND ANAEROBIC Blood Culture adequate volume   Culture   Final    NO GROWTH 6 DAYS Performed at [facility], [address] [place], [place] [address]    Report Status 12/14/2023 FINAL  Final  MRSA Next Gen by PCR, Nasal     Status: Abnormal   Collection Time: 12/08/23 11:50 PM   Specimen: Nasal Mucosa; Nasal Swab  Result Value Ref Range Status   MRSA by PCR Next Gen DETECTED (A) NOT DETECTED Final    Comment:  RESULT CALLED TO, READ BACK BY AND VERIFIED WITH: L [email or web address] RN 12/09/2023 @ 0246 BY AB (NOTE) The GeneXpert MRSA Assay (FDA approved for NASAL specimens only), is one component of a comprehensive MRSA colonization surveillance program. It is not intended to diagnose MRSA infection nor to guide or monitor treatment for MRSA infections. Test performance is not FDA approved in patients less than 46 years old. Performed at [facility], [address], [place], [place] [address]     Radiology Report US  EKG SITE RITE Result Date: 12/14/2023 If Site Rite image not attached, placement could not be confirmed due to current cardiac rhythm.    Signature  -   Lynnwood Sauer M.D on 12/15/2023 at 10:54 AM   -  To page go to [email or web address]

## 2023-12-16 DIAGNOSIS — G40901 Epilepsy, unspecified, not intractable, with status epilepticus: Secondary | ICD-10-CM | POA: Diagnosis not present

## 2023-12-16 LAB — GLUCOSE, CAPILLARY
Glucose-Capillary: 131 mg/dL — ABNORMAL HIGH (ref 70–99)
Glucose-Capillary: 163 mg/dL — ABNORMAL HIGH (ref 70–99)
Glucose-Capillary: 186 mg/dL — ABNORMAL HIGH (ref 70–99)

## 2023-12-16 MED ORDER — VALPROIC ACID 250 MG/5ML PO SOLN: 750.0000 mg | Freq: Two times a day (BID) | ORAL | Status: DC

## 2023-12-16 MED ORDER — CARVEDILOL 6.25 MG PO TABS: 6.2500 mg | ORAL_TABLET | Freq: Two times a day (BID) | ORAL | Status: DC

## 2023-12-16 MED ORDER — LEVETIRACETAM 750 MG PO TABS: 1500.0000 mg | ORAL_TABLET | Freq: Two times a day (BID) | ORAL | Status: DC

## 2023-12-16 MED ORDER — CARVEDILOL 6.25 MG PO TABS
6.2500 mg | ORAL_TABLET | Freq: Two times a day (BID) | ORAL | Status: DC
  Administered 2023-12-16: 6.25 mg via ORAL
  Filled 2023-12-16: qty 1

## 2023-12-16 NOTE — Plan of Care (Signed)
  Problem: Clinical Measurements: Goal: Ability to maintain clinical measurements within normal limits will improve Outcome: Adequate for Discharge Goal: Will remain free from infection Outcome: Adequate for Discharge Goal: Diagnostic test results will improve Outcome: Adequate for Discharge Goal: Respiratory complications will improve Outcome: Adequate for Discharge Goal: Cardiovascular complication will be avoided Outcome: Adequate for Discharge   Problem: Nutrition: Goal: Adequate nutrition will be maintained Outcome: Adequate for Discharge   Problem: Elimination: Goal: Will not experience complications related to bowel motility Outcome: Adequate for Discharge Goal: Will not experience complications related to urinary retention Outcome: Adequate for Discharge   Problem: Pain Managment: Goal: General experience of comfort will improve and/or be controlled Outcome: Adequate for Discharge   Problem: Safety: Goal: Ability to remain free from injury will improve Outcome: Adequate for Discharge   Problem: Skin Integrity: Goal: Risk for impaired skin integrity will decrease Outcome: Adequate for Discharge   Problem: Education: Goal: Ability to describe self-care measures that may prevent or decrease complications (Diabetes Survival Skills Education) will improve Outcome: Adequate for Discharge Goal: Individualized Educational Video(s) Outcome: Adequate for Discharge   Problem: Coping: Goal: Ability to adjust to condition or change in health will improve Outcome: Adequate for Discharge   Problem: Fluid Volume: Goal: Ability to maintain a balanced intake and output will improve Outcome: Adequate for Discharge   Problem: Metabolic: Goal: Ability to maintain appropriate glucose levels will improve Outcome: Adequate for Discharge   Problem: Nutritional: Goal: Maintenance of adequate nutrition will improve Outcome: Adequate for Discharge Goal: Progress toward achieving an  optimal weight will improve Outcome: Adequate for Discharge   Problem: Skin Integrity: Goal: Risk for impaired skin integrity will decrease Outcome: Adequate for Discharge   Problem: Tissue Perfusion: Goal: Adequacy of tissue perfusion will improve Outcome: Adequate for Discharge   Problem: Safety: Goal: Non-violent Restraint(s) Outcome: Adequate for Discharge

## 2023-12-16 NOTE — Discharge Summary (Addendum)
 Jacob Mcgrath. ZOX:[id number] DOB: 09-02-1955 DOA: 12/08/2023  PCP: Wyvonna Heidelberg, MD  Admit date: 12/08/2023  Discharge date: 12/16/2023  Admitted From: Home   Disposition:  SNF   Recommendations for Outpatient Follow-up:   Follow up with PCP in 1-2 weeks  PCP Please obtain BMP/CBC, 2 view CXR in 1week,  (see Discharge instructions)   PCP Please follow up on the following pending results: Check BMP in 3 to 4 days.   Home Health: None   Equipment/Devices: None  Consultations: Neuro Discharge Condition: Stable    CODE STATUS: Full    Diet Recommendation: Soft diet with feeding assistance and aspiration precautions    Chief Complaint  Patient presents with   Seizures     Brief history of present illness from the day of admission and additional interim summary    68 year old male with past medical history as below, which is significant for dementia, diabetes, stroke with left-sided weakness, and recent diagnosis of seizure in December 2024. He was admitted in January with status epilepticus requiring intubation in January of this year. Course was complicated by aspiration pneumonia. He was discharged on Keppra . He resides in some sort of facility and has a legal guardian. He presented to [facility] emergency department 5/15 after having seizure witnessed by his roommate. He did wake up after the seizure but only briefly and then had another seizure. Facility reports he has been compliant with Keppra . Upon EMS arrival he had been seizing for approximately 25 minutes and was given IM Versed  in the field without improvement. Upon arrival to [facility] he had active seizure. 8 total milligrams of IV Ativan  were given without improvement. He was intubated for airway protection. Generalized seizure improved  after intubation with continuous propofol  infusion but twitches were still observed.    The patient was transferred to [facility] for continuous EEG monitoring and neurology evaluation.  He was admitted by the ICU team, seen by neurology, stabilized, extubated and transferred to my care on 12/13/2023 on day 5 of his hospital stay, still quite weak and frail, still has dysphagia with speech and PT following.     Significant Hospital Events: Including procedures, antibiotic start and stop dates in addition to other pertinent events   5/15 admitted for status epilepticus. Intubated.  5/18 extubated                                                                 Hospital Course   Status epilepticus Acute metabolic encephalopathy Hx CVA, residual L sided def  / spasticity    CT head non-acute. Old stroke likely etiology for bthru sz now on combination of Depakene  and Keppra , currently appears to be seizure-free, no headache or new focal deficits.  Seen by neuro and they have signed  off.  Continue PT, OT and speech therapy.  Advance activity.  Fully improving encephalopathy, likely mild hospital-acquired delirium, continue present AED treatment, clinically stable will be discharged back to SNF on 12/16/2023.   Dementia ? Mood disorder  -on home paxil , baclofen     Acute hypoxic resp failure P: -extubated, continue chest PT, I-S and flutter valve, currently on room air, and symptom-free.   Hypertension P: - Switched to Coreg for better control   Dysphagia P: -SLP following - On soft diet, no swallowing issues continue same diet with outpatient SLP follow-up at SNF.   AKI.  Mild, good urine output, no signs of uremia, hydrated, repeat BMP in 3-4 days at [facility].   DM2 w hypoglycemia (improved) P: - on diet control, monitor CBGs q. ACH S at SNF.    Discharge diagnosis     Principal Problem:   Status epilepticus [facility]) Active Problems:   Malnutrition of moderate degree    Discharge  instructions    Discharge Instructions     Discharge instructions   Complete by: As directed    Follow with Primary MD Wyvonna Heidelberg, MD in 7 days   Get CBC, CMP, 2 view Chest X ray -  checked next visit with your primary MD or SNF MD   Activity: As tolerated with Full fall precautions use walker/cane & assistance as needed  Disposition SNF  Diet: Soft low carbohydrate diet with feeding assistance and aspiration precautions.  Check CBGs q. ACH S.  Special Instructions: If you have smoked or chewed Tobacco  in the last 2 yrs please stop smoking, stop any regular Alcohol  and or any Recreational drug use.  On your next visit with your primary care physician please Get Medicines reviewed and adjusted.  Please request your Prim.MD to go over all Hospital Tests and Procedure/Radiological results at the follow up, please get all Hospital records sent to your Prim MD by signing hospital release before you go home.  If you experience worsening of your admission symptoms, develop shortness of breath, life threatening emergency, suicidal or homicidal thoughts you must seek medical attention immediately by calling 911 or calling your MD immediately  if symptoms less severe.  You Must read complete instructions/literature along with all the possible adverse reactions/side effects for all the Medicines you take and that have been prescribed to you. Take any new Medicines after you have completely understood and accpet all the possible adverse reactions/side effects.   Do not drive when taking Pain medications.  Do not take more than prescribed Pain, Sleep and Anxiety Medications  Wear Seat belts while driving.   Increase activity slowly   Complete by: As directed        Discharge Medications   Allergies as of 12/16/2023       Reactions   Amoxicillin Other (See Comments)   Unknown reaction per facility   Ampicillin Other (See Comments)   Unknown reaction per facility    Tetracyclines & Related Rash        Medication List     STOP taking these medications    metoprolol  tartrate 25 MG tablet Commonly known as: LOPRESSOR        TAKE these medications    aspirin  EC 81 MG tablet Take 81 mg by mouth daily. Swallow whole.   atorvastatin  80 MG tablet Commonly known as: LIPITOR  Take 80 mg by mouth at bedtime.   Baclofen  5 MG Tabs Take 1 tablet by mouth in the morning, at noon, and  at bedtime.   carvedilol 6.25 MG tablet Commonly known as: COREG Take 1 tablet (6.25 mg total) by mouth 2 (two) times daily with a meal.   clopidogrel  75 MG tablet Commonly known as: PLAVIX  Take 1 tablet (75 mg total) by mouth daily with breakfast. What changed: when to take this   DULoxetine  30 MG capsule Commonly known as: CYMBALTA  Take 30 mg by mouth daily.   guaiFENesin 600 MG 12 hr tablet Commonly known as: MUCINEX Take 600 mg by mouth 2 (two) times daily as needed for cough.   levETIRAcetam  750 MG tablet Commonly known as: KEPPRA  Take 2 tablets (1,500 mg total) by mouth 2 (two) times daily. What changed: how much to take   melatonin 3 MG Tabs tablet Take 2 tablets (6 mg total) by mouth at bedtime.   multivitamin tablet Take 1 tablet by mouth daily.   pantoprazole  40 MG tablet Commonly known as: PROTONIX  Take 1 tablet (40 mg total) by mouth daily.   PARoxetine  40 MG tablet Commonly known as: PAXIL  Take 40 mg by mouth daily.   tamsulosin  0.4 MG Caps capsule Commonly known as: FLOMAX  Take 1 capsule (0.4 mg total) by mouth daily.   valproic  acid 250 MG/5ML solution Commonly known as: DEPAKENE  Take 15 mLs (750 mg total) by mouth 2 (two) times daily.         Follow-up Information     Fanta, Nancee Awe, MD. Schedule an appointment as soon as possible for a visit in 1 week(s).   Specialty: Internal Medicine Contact information: [address] [place] [address] [phone]         [facility].  Schedule an appointment as soon as possible for a visit in 1 week(s).   Contact information: [address]     [address] [place]  [address] [phone]                Major procedures and Radiology Reports - PLEASE review detailed and final reports thoroughly  -       US  EKG SITE RITE Result Date: 12/14/2023 If Site Rite image not attached, placement could not be confirmed due to current cardiac rhythm.  DG Chest Port 1 View Result Date: 12/13/2023 CLINICAL DATA:  Shortness of breath EXAM: PORTABLE CHEST 1 VIEW COMPARISON:  12/09/2023 FINDINGS: Stable cardiomediastinal contours. Aortic atherosclerotic calcifications. Interval removal of ET tube and enteric tube. Decreased lung volumes with persistent bilateral lower lobe atelectasis. No signs of pleural effusion or interstitial edema. IMPRESSION: 1. Interval removal of ET tube and enteric tube. 2. Decreased lung volumes with persistent bilateral lower lobe atelectasis. Electronically Signed   By: Kimberley Penman M.D.   On: 12/13/2023 06:43   DG Swallowing Func-Speech Pathology Result Date: 12/12/2023 Table formatting from the original result was not included. Modified Barium Swallow Study Patient Details Name: Jacob Mcgrath. MRN: [id number] Date of Birth: Jan 11, 1956 Today's Date: 12/12/2023 HPI/PMH: HPI: Chayce Robbins is a 68 year old male who resides in facility and has a legal guardian, and presented to [facility] emergency department 5/15 after having seizure witnessed by his roommate which lasted 25 minutes prior to EMS arrival.  Pt required ETT 5/15-18 for airway protection.  Pt with past medical history which is significant for dementia, diabetes, stroke with left-sided weakness, and recent diagnosis of seizure in December 2024. He was admitted in January with status epilepticus requiring intubation in January of this year. Course was complicated by aspiration pneumonia. Seen by  speech with clinical  management of dyspahgia and placed on regular diet with thin liquids. 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  These deficits resulted in intermittent oral holding of bolus trials, prolonged oral phase, delayed swallow initation intermittently to the pyriform sinuses, and oral and vallecula residue.  There a single throat clear observed during study which at time time was suspected to be 2/2 penetration of residuals, but could not be found on repeat viewing suggesting throat clear in absence of penetration/aspiration.  Pt benefited from cues to take multiple swallows to decrease oral and pharyngeal residue, but was able to adequately protect airway throughout today's study.  Pt should be able to advance diet clinically.  Oral holding was noted for longer periods of time with liquid than with solid trials, and pt benefitted from verbal cues to swallow.  With pill simulation, pt prefered to swallow tablet with puree.  Pt was able acheive oral clearance and on esopahgeal sweep there was only trace-mild retention of contrast in distal esophagus.  Recommend chopped/ground (dysphagia 2) diet with thin liquids. DIGEST Swallow Severity Rating*  Safety: 0  Efficiency: 1  Overall Pharyngeal Swallow Severity: 1 1: mild; 2: moderate; 3: severe; 4: profound *The Dynamic Imaging Grade of Swallowing Toxicity is standardized for the head and neck cancer population, however, demonstrates promising clinical applications across populations to standardize the clinical rating of pharyngeal swallow safety and severity. Factors that may increase risk of adverse event in presence of aspiration Roderick Civatte & Jessy Morocco 2021): Reduced cognitive function Swallow Evaluation Recommendations Recommendations: PO diet PO Diet  Recommendation: Dysphagia 2 (Finely chopped) Liquid Administration via: Cup;Straw Medication Administration: Whole meds with puree Supervision: Staff to assist with self-feeding;Intermittent supervision/cueing for swallowing strategies (PRN) Swallowing strategies  : Slow rate; Small bites/sips; Multiple dry swallows after each bite/sip; Check for pocketing or oral holding; verbal cues to swallow Postural changes: Position pt fully upright for meals Oral care recommendations: Oral care BID (2x/day) Treatment Plan Treatment Plan Treatment recommendations: Therapy as outlined in treatment plan below Follow-up recommendations: -- (ST at next level of care at present) Functional status assessment: Patient has had a recent decline in their functional status and demonstrates the ability to make significant improvements in function in a reasonable and predictable amount of time. Treatment frequency: Min 2x/week Treatment duration: 2 weeks Interventions: Aspiration precaution training; Trials of upgraded texture/liquids; Diet toleration management by SLP Recommendations Recommendations for follow up therapy are one component of a multi-disciplinary discharge planning process, led by the attending physician.  Recommendations may be updated based on patient status, additional functional criteria and insurance authorization. Assessment: Orofacial Exam: Orofacial Exam Oral Cavity: Oral Hygiene: WFL Oral Cavity - Dentition: Edentulous; Dentures, not available Orofacial Anatomy: WFL Oral Motor/Sensory Function: -- (See BSE) Anatomy: No data recorded Boluses Administered: Boluses Administered Boluses Administered: Thin liquids (Level 0); Mildly thick liquids (Level 2, nectar thick); Moderately thick liquids (Level 3, honey thick); Puree; Solid  Oral Impairment Domain: Oral Impairment Domain Lip Closure: Escape progressing to mid-chin Tongue control during bolus hold: Cohesive bolus between tongue to palatal seal (premature spillage  at times, suspect inattention) Bolus preparation/mastication: Slow prolonged chewing/mashing with complete recollection Bolus transport/lingual motion: Delayed initiation of tongue motion (oral holding) Oral residue: Residue collection on oral structures Location of oral residue : Tongue; Floor of mouth Initiation of pharyngeal swallow :  Pyriform sinuses; Posterior angle of the ramus  Pharyngeal Impairment Domain: Pharyngeal Impairment Domain Soft palate elevation: No bolus between soft palate (SP)/pharyngeal wall (PW) Laryngeal elevation: Complete superior movement of thyroid cartilage with complete approximation of arytenoids to epiglottic petiole Anterior hyoid excursion: Complete anterior movement Epiglottic movement: Complete inversion Laryngeal vestibule closure: Complete, no air/contrast in laryngeal vestibule Pharyngeal stripping wave : Present - complete Pharyngeal contraction (A/P view only): N/A Pharyngoesophageal segment opening: Complete distension and complete duration, no obstruction of flow Tongue base retraction: Trace column of contrast or air between tongue base and PPW Pharyngeal residue: Collection of residue within or on pharyngeal structures Location of pharyngeal residue: Valleculae; Pyriform sinuses  Esophageal Impairment Domain: Esophageal Impairment Domain Esophageal clearance upright position: Esophageal retention (mild) Pill: Pill Consistency administered: Puree Puree: WFL Penetration/Aspiration Scale Score: Penetration/Aspiration Scale Score 1.  Material does not enter airway: Thin liquids (Level 0); Moderately thick liquids (Level 3, honey thick); Mildly thick liquids (Level 2, nectar thick); Pill; Solid; Puree Compensatory Strategies: Compensatory Strategies Compensatory strategies: Yes Multiple swallows: Effective Effective Multiple Swallows: Puree; Pill   General Information: No data recorded Diet Prior to this Study: NPO   No data recorded  No data recorded  No data recorded  No  data recorded Behavior/Cognition: Alert; Cooperative; Pleasant mood No data recorded No data recorded No data recorded Volitional Swallow: Able to elicit Exam Limitations: No limitations Goal Planning: Prognosis for improved oropharyngeal function: Good No data recorded No data recorded Patient/Family Stated Goal: not stated Consulted and agree with results and recommendations: Patient; Nurse Pain: Pain Assessment Breathing: 0 Negative Vocalization: 0 Facial Expression: 0 Body Language: 0 Consolability: 0 PAINAD Score: 0 Facial Expression: 0 Body Movements: 0 Muscle Tension: 0 Compliance with ventilator (intubated pts.): 0 Vocalization (extubated pts.): N/A CPOT Total: 0 End of Session: Start Time:SLP Start Time (ACUTE ONLY): 0935 Stop Time: SLP Stop Time (ACUTE ONLY): 0954 Time Calculation:SLP Time Calculation (min) (ACUTE ONLY): 19 min Charges: SLP Evaluations $ SLP Speech Visit: 1 Visit SLP Evaluations $BSS Swallow: 1 Procedure $MBS Swallow: 1 Procedure SLP visit diagnosis: SLP Visit Diagnosis: Dysphagia, oropharyngeal phase (R13.12) Past Medical History: Past Medical History: Diagnosis Date  Dementia (HCC)   Diabetes mellitus without complication (HCC)   Hypertension   Stroke [facility])  Past Surgical History: Past Surgical History: Procedure Laterality Date  ABDOMINAL AORTOGRAM W/LOWER EXTREMITY N/A 10/04/2022  Procedure: ABDOMINAL AORTOGRAM W/LOWER EXTREMITY;  Surgeon: Dannis Dy, MD;  Location: [facility] INVASIVE CV LAB;  Service: Cardiovascular;  Laterality: N/A;  ABDOMINAL AORTOGRAM W/LOWER EXTREMITY N/A 10/08/2022  Procedure: ABDOMINAL AORTOGRAM W/LOWER EXTREMITY;  Surgeon: Carlene Che, MD;  Location: [facility] INVASIVE CV LAB;  Service: Cardiovascular;  Laterality: N/A;  PERIPHERAL VASCULAR INTERVENTION  10/04/2022  Procedure: PERIPHERAL VASCULAR INTERVENTION;  Surgeon: Dannis Dy, MD;  Location: [facility] INVASIVE CV LAB;  Service: Cardiovascular;;  PERIPHERAL VASCULAR INTERVENTION Left 10/08/2022   Procedure: PERIPHERAL VASCULAR INTERVENTION;  Surgeon: Carlene Che, MD;  Location: [facility] INVASIVE CV LAB;  Service: Cardiovascular;  Laterality: Left;  TONSILLECTOMY   [name], MA, CCC-SLP Acute Rehabilitation Services Office: [phone] 12/12/2023, 10:47 AM  DG Chest Port 1 View Result Date: 12/09/2023 CLINICAL DATA:  Middle later dependence. EXAM: PORTABLE CHEST 1 VIEW COMPARISON:  12/08/2023 FINDINGS: Endotracheal tube tip is approximately 11 mm above the base of the carina and directed towards the right mainstem bronchus. NG tube tip is in the stomach. Linear density at the lung bases suggest atelectasis, similar to prior. No edema or  focal airspace consolidation. No substantial pleural effusion. Telemetry leads overlie the chest. IMPRESSION: 1. Endotracheal tube tip is approximately 11 mm above the base of the carina and directed towards the right mainstem bronchus. Recommend retraction by 2-3 cm. 2. Bibasilar atelectasis. Electronically Signed   By: Donnal Fusi M.D.   On: 12/09/2023 09:21   Overnight EEG with video Result Date: 12/09/2023 Arleene Lack, MD     12/10/2023  6:19 AM Patient Name: Jacob Mcgrath. MRN: [id number] Epilepsy Attending: Arleene Lack Referring Physician/Provider: Augustin Leber, MD Duration: 12/09/2023 0123 to 12/10/2023 0123 Patient history:  68 y.o. male with a history of previous stroke and subsequent seizures who presented with refractory status epilepticus.  EEG to evaluate for seizure. Level of alertness:  comatose AEDs during EEG study: LEV, VPA, Propofol  Technical aspects: This EEG study was done with scalp electrodes positioned according to the 10-20 International system of electrode placement. Electrical activity was reviewed with band pass filter of 1-70Hz , sensitivity of 7 uV/mm, display speed of 43mm/sec with a 60Hz  notched filter applied as appropriate. EEG data were recorded continuously and digitally stored.  Video monitoring was available  and reviewed as appropriate. Description: EEG initially showed continuous generalized and lateralized right hemisphere 3 to 6 Hz theta-delta slowing admixed with 13-15hz  generalized beta activity. Hyperventilation and photic stimulation were not performed.  Event button was pressed on 12/09/2023 at 1755 for right arm tremor like movements. Concomitant eeg before, during and after he event didn't show any eeg change to suggest seizure. ABNORMALITY - Continuous slow, generalized and lateralized right hemisphere IMPRESSION: This study is suggestive of cortical dysfunction in right hemisphere likely secondary to underlying stroke, post-ictal state. Additionally there was severe diffuse encephalopathy, nonspecific etiology but likely related to sedation. No seizures or epileptiform discharges were seen throughout the recording. Event button was pressed on 12/09/2023 at 1755 for right arm tremor like movements without concomitant eeg change. This was most likely a NON epileptic event. Priyanka O Yadav   CT Head Wo Contrast Result Date: 12/08/2023 CLINICAL DATA:  Status epilepticus, altered level of consciousness EXAM: CT HEAD WITHOUT CONTRAST TECHNIQUE: Contiguous axial images were obtained from the base of the skull through the vertex without intravenous contrast. RADIATION DOSE REDUCTION: This exam was performed according to the departmental dose-optimization program which includes automated exposure control, adjustment of the mA and/or kV according to patient size and/or use of iterative reconstruction technique. COMPARISON:  07/28/2023 FINDINGS: Brain: No acute infarct or hemorrhage. Stable chronic ischemic changes within the right MCA territory. Lateral ventricles and midline structures are unremarkable. No acute extra-axial fluid collections. No mass effect. Vascular: No hyperdense vessel or unexpected calcification. Skull: Normal. Negative for fracture or focal lesion. Sinuses/Orbits: No acute finding. Other:  None. IMPRESSION: 1. Stable head CT, no acute intracranial process. Electronically Signed   By: Bobbye Burrow M.D.   On: 12/08/2023 20:44   DG Chest Portable 1 View Result Date: 12/08/2023 CLINICAL DATA:  Intubated, status epilepticus EXAM: PORTABLE CHEST 1 VIEW COMPARISON:  07/29/2023 FINDINGS: Single frontal view of the chest demonstrates endotracheal tube overlying tracheal air column, tip 6 cm above carina. Enteric catheter passes below diaphragm tip projecting over gastric fundus. Cardiac silhouette is unremarkable. Stable bibasilar scarring. No airspace disease, effusion, or pneumothorax. IMPRESSION: 1. Support devices as above. 2. No acute intrathoracic process.  Stable bibasilar scarring. Electronically Signed   By: Bobbye Burrow M.D.   On: 12/08/2023 20:22    Micro Results  Recent Results (from the past 240 hours)  Blood culture (routine x 2)     Status: None   Collection Time: 12/08/23  8:27 PM   Specimen: BLOOD  Result Value Ref Range Status   Specimen Description BLOOD BLOOD RIGHT ARM  Final   Special Requests   Final    BOTTLES DRAWN AEROBIC ONLY Blood Culture results may not be optimal due to an inadequate volume of blood received in culture bottles   Culture   Final    NO GROWTH 6 DAYS Performed at [facility], [address], [place], [place] [address]    Report Status 12/14/2023 FINAL  Final  Blood culture (routine x 2)     Status: None   Collection Time: 12/08/23  9:50 PM   Specimen: BLOOD  Result Value Ref Range Status   Specimen Description BLOOD CENTRAL LINE  Final   Special Requests   Final    BOTTLES DRAWN AEROBIC AND ANAEROBIC Blood Culture adequate volume   Culture   Final    NO GROWTH 6 DAYS Performed at [facility], [address] [place], [place] [address]    Report Status 12/14/2023 FINAL  Final  MRSA Next Gen by PCR, Nasal     Status: Abnormal   Collection Time: 12/08/23 11:50 PM   Specimen: Nasal Mucosa; Nasal Swab  Result Value Ref Range  Status   MRSA by PCR Next Gen DETECTED (A) NOT DETECTED Final    Comment: RESULT CALLED TO, READ BACK BY AND VERIFIED WITH: L [id number] RN 12/09/2023 @ 0246 BY AB (NOTE) The GeneXpert MRSA Assay (FDA approved for NASAL specimens only), is one component of a comprehensive MRSA colonization surveillance program. It is not intended to diagnose MRSA infection nor to guide or monitor treatment for MRSA infections. Test performance is not FDA approved in patients less than 59 years old. Performed at [facility], [address], [place], [place] [address]     Today   Subjective    Talan Gildner today has no headache,no chest abdominal pain,no new weakness tingling or numbness, feels much better    Objective   Blood pressure 145/90, pulse 90, temperature 98.3 F (36.8 C), temperature source Oral, resp. rate 17, height 5\' 9"  (1.753 m), weight 62.3 kg, SpO2 95%.   Intake/Output Summary (Last 24 hours) at 12/16/2023 0810 Last data filed at 12/16/2023 0428 Gross per 24 hour  Intake --  Output 750 ml  Net -750 ml    Exam  Awake , pleasantly confused, oriented x 1, No new F.N deficits,    [place].AT,PERRAL Supple Neck,   Symmetrical Chest wall movement, Good air movement bilaterally, CTAB RRR,No Gallops,   +ve B.Sounds, Abd Soft, Non tender,  No Cyanosis, Clubbing or edema    Data Review   Recent Labs  Lab 12/10/23 0533 12/11/23 0420 12/12/23 0434  WBC 13.0* 12.7* 18.8*  HGB 12.5* 11.6* 12.6*  HCT 38.0* 34.6* 39.1  PLT 264 235 272  MCV 88.0 87.6 90.1  MCH 28.9 29.4 29.0  MCHC 32.9 33.5 32.2  RDW 13.5 13.6 13.5    Recent Labs  Lab 12/09/23 1538 12/10/23 0533 12/11/23 0420 12/12/23 0434 12/12/23 1459  NA  --  137 142 142 144  K  --  3.6 4.0 5.1 4.5  CL  --  107 111 106 104  CO2  --  22 25 27 27   ANIONGAP  --  8 6 9 13   GLUCOSE  --  194* 161*  132* 131*  BUN  --  13 14 19  27*  CREATININE  --  0.98 0.94 0.99 1.33*  PROCALCITON 0.23  --   --   --   --    AMMONIA 19  --   --   --   --   MG  --  2.0 1.8 1.9  --   PHOS  --  2.6 2.4* 4.6  --   CALCIUM   --  8.7* 8.7* 9.3 9.3    Total Time in preparing paper work, data evaluation and todays exam - 35 minutes  Signature  -    Lynnwood Sauer M.D on 12/16/2023 at 8:10 AM   -  To page go to [email or web address]

## 2023-12-16 NOTE — TOC Transition Note (Signed)
 Transition of Care [facility]) - Discharge Note   Patient Details  Name: Jacob Mcgrath. MRN: [id number] Date of Birth: Sep 30, 1955  Transition of Care [facility]) CM/SW Contact:  Jannice Mends, LCSW Phone Number: 12/16/2023, 11:08 AM   Clinical Narrative:    Patient will DC to: [facility] Anticipated DC date: 12/16/23 Family notified: Legal Guardian, Bethene Brought Transport by: Lyna Sandhoff   Per MD patient ready for DC to . RN to call report prior to discharge [phone] room 311B). RN, patient, patient's family, and facility notified of DC. Discharge Summary and FL2 sent to facility. DC packet on chart. Ambulance transport requested for patient.   CSW will sign off for now as social work intervention is no longer needed. Please consult us  again if new needs arise.     Final next level of care: Skilled Nursing Facility Barriers to Discharge: Barriers Resolved   Patient Goals and CMS Choice            Discharge Placement   Existing PASRR number confirmed : 12/16/23          Patient chooses bed at:  [facility]) Patient to be transferred to facility by: [facility] Name of family member notified: Legal Guardian, Halie with [employer] [place] Patient and family notified of of transfer: 12/16/23  Discharge Plan and Services Additional resources added to the After Visit Summary for   In-house Referral: Clinical Social Work                                   Social Drivers of Health (SDOH) Interventions SDOH Screenings   Food Insecurity: Patient Unable To Answer (12/09/2023)  Housing: Patient Unable To Answer (12/09/2023)  Transportation Needs: Patient Unable To Answer (12/09/2023)  Utilities: Patient Unable To Answer (12/09/2023)  Social Connections: Patient Unable To Answer (12/09/2023)  Tobacco Use: High Risk (12/08/2023)     Readmission Risk Interventions    08/01/2023    3:31 PM 03/01/2023    2:40 PM  Readmission Risk Prevention Plan  Transportation Screening Complete  Complete  PCP or Specialist Appt within 5-7 Days Complete Not Complete  Home Care Screening Complete Complete  Medication Review (RN CM) Complete Complete

## 2023-12-16 NOTE — NC FL2 (Signed)
 [place]  MEDICAID FL2 LEVEL OF CARE FORM     IDENTIFICATION  Patient Name: Jacob Mcgrath. Birthdate: 05-26-1956 Sex: male Admission Date (Current Location): 12/08/2023  [facility] and [place] Number:  [employer] and Address:  [facility], [address] [place], [place] [address]      Provider Number: [id number]  Attending Physician Name and Address:  Cala Castleman, MD  Relative Name and Phone Number:       Current Level of Care: Hospital Recommended Level of Care: Skilled Nursing Facility Prior Approval Number:    Date Approved/Denied:   PASRR Number: [id number]  Discharge Plan: SNF    Current Diagnoses: Patient Active Problem List   Diagnosis Date Noted   Malnutrition of moderate degree 12/10/2023   Hypomagnesemia 08/02/2023   Community acquired pneumonia of right lower lobe of lung 08/02/2023   Dementia without behavioral disturbance (HCC) 08/02/2023   Acute metabolic encephalopathy 08/02/2023   Essential hypertension 08/02/2023   Status epilepticus (HCC) 07/29/2023   Seizure (HCC) 07/02/2023   Stroke (HCC) 07/01/2023   Leukocytosis 07/01/2023   Tremor 02/27/2023   GERD (gastroesophageal reflux disease) 02/27/2023   Depression 02/27/2023   Protein-calorie malnutrition, severe 10/07/2022   Ischemic leg 10/01/2022   Hypertension 10/01/2022   Controlled type 2 diabetes mellitus without complication, without long-term current use of insulin  (HCC) 10/01/2022    Orientation RESPIRATION BLADDER Height & Weight     Self  Normal Incontinent, External catheter Weight: 137 lb 5.6 oz (62.3 kg) Height:  5\' 9"  (175.3 cm)  BEHAVIORAL SYMPTOMS/MOOD NEUROLOGICAL BOWEL NUTRITION STATUS      Continent Diet (See dc summary)  AMBULATORY STATUS COMMUNICATION OF NEEDS Skin   Extensive Assist Verbally Normal                       Personal Care Assistance Level of Assistance  Bathing, Feeding, Dressing Bathing Assistance:  Maximum assistance Feeding assistance: Maximum assistance Dressing Assistance: Maximum assistance     Functional Limitations Info             SPECIAL CARE FACTORS FREQUENCY                       Contractures Contractures Info: Not present    Additional Factors Info  Code Status, Allergies Code Status Info: Full Allergies Info: Amoxicillin, Ampicillin, Tetracyclines & Related           Current Medications (12/16/2023):  This is the current hospital active medication list Current Facility-Administered Medications  Medication Dose Route Frequency Provider Last Rate Last Admin   acetaminophen  (TYLENOL ) tablet 1,000 mg  1,000 mg Oral Q6H PRN Howerter, Justin B, DO       aspirin  chewable tablet 81 mg  81 mg Oral Daily Utomwen, Adesuwa, RPH   81 mg at 12/16/23 0931   atorvastatin  (LIPITOR ) tablet 80 mg  80 mg Oral QHS Singh, Prashant K, MD   80 mg at 12/15/23 2154   baclofen  (LIORESAL ) tablet 5 mg  5 mg Oral TID Utomwen, Adesuwa, RPH   5 mg at 12/16/23 0932   carvedilol (COREG) tablet 6.25 mg  6.25 mg Oral BID WC Singh, Prashant K, MD   6.25 mg at 12/16/23 0931   clopidogrel  (PLAVIX ) tablet 75 mg  75 mg Oral Daily Utomwen, Adesuwa, RPH   75 mg at 12/16/23 0933   dextrose  50 % solution 12.5 g  12.5 g Intravenous QID PRN Singh, Prashant  K, MD   12.5 g at 12/14/23 0752   docusate (COLACE) 50 MG/5ML liquid 100 mg  100 mg Oral BID Utomwen, Adesuwa, RPH   100 mg at 12/16/23 0931   enoxaparin  (LOVENOX ) injection 40 mg  40 mg Subcutaneous Daily Hoffman, Paul W, NP   40 mg at 12/16/23 0930   feeding supplement (ENSURE ENLIVE / ENSURE PLUS) liquid 237 mL  237 mL Oral BID BM Hunsucker, Archer Kobs, MD   237 mL at 12/16/23 1610   labetalol  (NORMODYNE ) injection 10 mg  10 mg Intravenous Q10 min PRN Lemmie Pyo, NP   10 mg at 12/09/23 1751   levETIRAcetam  (KEPPRA ) tablet 1,500 mg  1,500 mg Oral BID Utomwen, Adesuwa, RPH   1,500 mg at 12/16/23 0930   LORazepam  (ATIVAN ) injection 2 mg  2 mg  Intravenous Q5 Min x 3 PRN Lemmie Pyo, NP       multivitamin with minerals tablet 1 tablet  1 tablet Oral Daily Utomwen, Adesuwa, RPH   1 tablet at 12/16/23 0931   pantoprazole  (PROTONIX ) EC tablet 40 mg  40 mg Oral Daily Singh, Prashant K, MD   40 mg at 12/16/23 0931   PARoxetine  (PAXIL ) tablet 40 mg  40 mg Oral Daily Utomwen, Adesuwa, RPH   40 mg at 12/16/23 0930   polyethylene glycol (MIRALAX  / GLYCOLAX ) packet 17 g  17 g Oral BID Utomwen, Adesuwa, RPH   17 g at 12/16/23 9604   tamsulosin  (FLOMAX ) capsule 0.4 mg  0.4 mg Oral Daily Singh, Prashant K, MD   0.4 mg at 12/16/23 0930   valproic  acid (DEPAKENE ) 250 MG/5ML solution 750 mg  750 mg Oral BID Utomwen, Adesuwa, RPH   750 mg at 12/16/23 5409     Discharge Medications: Please see discharge summary for a list of discharge medications.  Relevant Imaging Results:  Relevant Lab Results:   Additional Information SS#: [id number]  Reinhold Rickey S Tjay Velazquez, LCSW

## 2023-12-16 NOTE — Progress Notes (Signed)
 Report given to Silvia at [place].

## 2023-12-16 NOTE — Progress Notes (Signed)
 AVS completed for discharge packet; unable to print patient has documented legal guardian that will need to be contacted before discharge.

## 2023-12-16 NOTE — Plan of Care (Signed)
  Problem: Clinical Measurements: Goal: Will remain free from infection Outcome: Progressing Goal: Diagnostic test results will improve Outcome: Progressing Goal: Cardiovascular complication will be avoided Outcome: Progressing   Problem: Elimination: Goal: Will not experience complications related to urinary retention Outcome: Progressing   Problem: Skin Integrity: Goal: Risk for impaired skin integrity will decrease Outcome: Progressing

## 2023-12-16 NOTE — Progress Notes (Addendum)
 12/16/2023 10:02 AM Attempted to reach pt. Daughter and guardian via telephone to complete admission hx. No answer at this time. Christalynn Boise, Leila Punt

## 2023-12-16 NOTE — Discharge Instructions (Signed)
 Follow with Primary MD Wyvonna Heidelberg, MD in 7 days   Get CBC, CMP, 2 view Chest X ray -  checked next visit with your primary MD or SNF MD   Activity: As tolerated with Full fall precautions use walker/cane & assistance as needed  Disposition SNF  Diet: Soft low carbohydrate diet with feeding assistance and aspiration precautions.  Check CBGs q. [facility].  Special Instructions: If you have smoked or chewed Tobacco  in the last 2 yrs please stop smoking, stop any regular Alcohol  and or any Recreational drug use.  On your next visit with your primary care physician please Get Medicines reviewed and adjusted.  Please request your Prim.MD to go over all Hospital Tests and Procedure/Radiological results at the follow up, please get all Hospital records sent to your Prim MD by signing hospital release before you go home.  If you experience worsening of your admission symptoms, develop shortness of breath, life threatening emergency, suicidal or homicidal thoughts you must seek medical attention immediately by calling 911 or calling your MD immediately  if symptoms less severe.  You Must read complete instructions/literature along with all the possible adverse reactions/side effects for all the Medicines you take and that have been prescribed to you. Take any new Medicines after you have completely understood and accpet all the possible adverse reactions/side effects.   Do not drive when taking Pain medications.  Do not take more than prescribed Pain, Sleep and Anxiety Medications  Wear Seat belts while driving.

## 2024-01-16 ENCOUNTER — Emergency Department (HOSPITAL_COMMUNITY)

## 2024-01-16 ENCOUNTER — Inpatient Hospital Stay (HOSPITAL_COMMUNITY)
Admission: EM | Admit: 2024-01-16 | Discharge: 2024-01-31 | DRG: 871 | Disposition: A | Discharge status: Hospice | Source: Skilled Nursing Facility | Attending: Internal Medicine | Admitting: Internal Medicine

## 2024-01-16 ENCOUNTER — Other Ambulatory Visit: Payer: Self-pay

## 2024-01-16 ENCOUNTER — Encounter (HOSPITAL_COMMUNITY): Payer: Self-pay

## 2024-01-16 DIAGNOSIS — L89151 Pressure ulcer of sacral region, stage 1: Secondary | ICD-10-CM | POA: Diagnosis present

## 2024-01-16 DIAGNOSIS — Z1152 Encounter for screening for COVID-19: Secondary | ICD-10-CM

## 2024-01-16 DIAGNOSIS — I739 Peripheral vascular disease, unspecified: Secondary | ICD-10-CM | POA: Diagnosis present

## 2024-01-16 DIAGNOSIS — I70222 Atherosclerosis of native arteries of extremities with rest pain, left leg: Secondary | ICD-10-CM | POA: Diagnosis not present

## 2024-01-16 DIAGNOSIS — F17210 Nicotine dependence, cigarettes, uncomplicated: Secondary | ICD-10-CM | POA: Diagnosis present

## 2024-01-16 DIAGNOSIS — I10 Essential (primary) hypertension: Secondary | ICD-10-CM | POA: Diagnosis present

## 2024-01-16 DIAGNOSIS — L97529 Non-pressure chronic ulcer of other part of left foot with unspecified severity: Secondary | ICD-10-CM | POA: Diagnosis present

## 2024-01-16 DIAGNOSIS — Z9889 Other specified postprocedural states: Secondary | ICD-10-CM | POA: Diagnosis not present

## 2024-01-16 DIAGNOSIS — R6510 Systemic inflammatory response syndrome (SIRS) of non-infectious origin without acute organ dysfunction: Secondary | ICD-10-CM | POA: Diagnosis present

## 2024-01-16 DIAGNOSIS — F0393 Unspecified dementia, unspecified severity, with mood disturbance: Secondary | ICD-10-CM | POA: Diagnosis present

## 2024-01-16 DIAGNOSIS — I70201 Unspecified atherosclerosis of native arteries of extremities, right leg: Secondary | ICD-10-CM | POA: Diagnosis not present

## 2024-01-16 DIAGNOSIS — R569 Unspecified convulsions: Secondary | ICD-10-CM | POA: Diagnosis not present

## 2024-01-16 DIAGNOSIS — L03116 Cellulitis of left lower limb: Secondary | ICD-10-CM | POA: Diagnosis present

## 2024-01-16 DIAGNOSIS — L89890 Pressure ulcer of other site, unstageable: Secondary | ICD-10-CM | POA: Diagnosis present

## 2024-01-16 DIAGNOSIS — I69354 Hemiplegia and hemiparesis following cerebral infarction affecting left non-dominant side: Secondary | ICD-10-CM

## 2024-01-16 DIAGNOSIS — G40909 Epilepsy, unspecified, not intractable, without status epilepticus: Secondary | ICD-10-CM | POA: Diagnosis present

## 2024-01-16 DIAGNOSIS — A419 Sepsis, unspecified organism: Principal | ICD-10-CM | POA: Diagnosis present

## 2024-01-16 DIAGNOSIS — I639 Cerebral infarction, unspecified: Secondary | ICD-10-CM | POA: Diagnosis present

## 2024-01-16 DIAGNOSIS — Z515 Encounter for palliative care: Secondary | ICD-10-CM | POA: Diagnosis not present

## 2024-01-16 DIAGNOSIS — Y831 Surgical operation with implant of artificial internal device as the cause of abnormal reaction of the patient, or of later complication, without mention of misadventure at the time of the procedure: Secondary | ICD-10-CM | POA: Diagnosis present

## 2024-01-16 DIAGNOSIS — I70262 Atherosclerosis of native arteries of extremities with gangrene, left leg: Secondary | ICD-10-CM | POA: Diagnosis present

## 2024-01-16 DIAGNOSIS — T82898A Other specified complication of vascular prosthetic devices, implants and grafts, initial encounter: Secondary | ICD-10-CM | POA: Diagnosis present

## 2024-01-16 DIAGNOSIS — E11649 Type 2 diabetes mellitus with hypoglycemia without coma: Secondary | ICD-10-CM | POA: Diagnosis not present

## 2024-01-16 DIAGNOSIS — Z7189 Other specified counseling: Secondary | ICD-10-CM

## 2024-01-16 DIAGNOSIS — Z7982 Long term (current) use of aspirin: Secondary | ICD-10-CM

## 2024-01-16 DIAGNOSIS — F0390 Unspecified dementia without behavioral disturbance: Secondary | ICD-10-CM | POA: Diagnosis not present

## 2024-01-16 DIAGNOSIS — I70245 Atherosclerosis of native arteries of left leg with ulceration of other part of foot: Secondary | ICD-10-CM | POA: Diagnosis not present

## 2024-01-16 DIAGNOSIS — F32A Depression, unspecified: Secondary | ICD-10-CM | POA: Diagnosis present

## 2024-01-16 DIAGNOSIS — E1152 Type 2 diabetes mellitus with diabetic peripheral angiopathy with gangrene: Secondary | ICD-10-CM | POA: Diagnosis present

## 2024-01-16 DIAGNOSIS — G9341 Metabolic encephalopathy: Secondary | ICD-10-CM | POA: Diagnosis present

## 2024-01-16 DIAGNOSIS — Z7902 Long term (current) use of antithrombotics/antiplatelets: Secondary | ICD-10-CM | POA: Diagnosis not present

## 2024-01-16 DIAGNOSIS — I700 Atherosclerosis of aorta: Secondary | ICD-10-CM | POA: Diagnosis not present

## 2024-01-16 DIAGNOSIS — E119 Type 2 diabetes mellitus without complications: Secondary | ICD-10-CM | POA: Diagnosis not present

## 2024-01-16 DIAGNOSIS — L89899 Pressure ulcer of other site, unspecified stage: Secondary | ICD-10-CM | POA: Diagnosis not present

## 2024-01-16 DIAGNOSIS — Z66 Do not resuscitate: Secondary | ICD-10-CM | POA: Diagnosis not present

## 2024-01-16 DIAGNOSIS — Z79899 Other long term (current) drug therapy: Secondary | ICD-10-CM | POA: Diagnosis not present

## 2024-01-16 DIAGNOSIS — L8990 Pressure ulcer of unspecified site, unspecified stage: Secondary | ICD-10-CM | POA: Diagnosis present

## 2024-01-16 DIAGNOSIS — T82856A Stenosis of peripheral vascular stent, initial encounter: Secondary | ICD-10-CM | POA: Diagnosis not present

## 2024-01-16 HISTORY — DX: Peripheral vascular disease, unspecified: I73.9

## 2024-01-16 HISTORY — DX: Epilepsy, unspecified, not intractable, without status epilepticus: G40.909

## 2024-01-16 LAB — COMPREHENSIVE METABOLIC PANEL WITH GFR
ALT: 17 U/L (ref 0–44)
AST: 31 U/L (ref 15–41)
Albumin: 2.6 g/dL — ABNORMAL LOW (ref 3.5–5.0)
Alkaline Phosphatase: 61 U/L (ref 38–126)
Anion gap: 9 (ref 5–15)
BUN: 12 mg/dL (ref 8–23)
CO2: 25 mmol/L (ref 22–32)
Calcium: 8.6 mg/dL — ABNORMAL LOW (ref 8.9–10.3)
Chloride: 105 mmol/L (ref 98–111)
Creatinine, Ser: 0.84 mg/dL (ref 0.61–1.24)
GFR, Estimated: >60 mL/min (ref >60)
Glucose, Bld: 123 mg/dL — ABNORMAL HIGH (ref 70–99)
Potassium: 4.2 mmol/L (ref 3.5–5.1)
Sodium: 139 mmol/L (ref 135–145)
Total Bilirubin: 0.6 mg/dL (ref 0.0–1.2)
Total Protein: 7.4 g/dL (ref 6.5–8.1)

## 2024-01-16 LAB — CBC WITH DIFFERENTIAL/PLATELET
Abs Immature Granulocytes: 0.07 10*3/uL (ref 0.00–0.07)
Basophils Absolute: 0 10*3/uL (ref 0.0–0.1)
Basophils Relative: 0 %
Eosinophils Absolute: 0.5 10*3/uL (ref 0.0–0.5)
Eosinophils Relative: 4 %
HCT: 30 % — ABNORMAL LOW (ref 39.0–52.0)
Hemoglobin: 9.6 g/dL — ABNORMAL LOW (ref 13.0–17.0)
Immature Granulocytes: 1 %
Lymphocytes Relative: 19 %
Lymphs Abs: 2.8 10*3/uL (ref 0.7–4.0)
MCH: 29 pg (ref 26.0–34.0)
MCHC: 32 g/dL (ref 30.0–36.0)
MCV: 90.6 fL (ref 80.0–100.0)
Monocytes Absolute: 2.1 10*3/uL — ABNORMAL HIGH (ref 0.1–1.0)
Monocytes Relative: 14 %
Neutro Abs: 9.5 10*3/uL — ABNORMAL HIGH (ref 1.7–7.7)
Neutrophils Relative %: 62 %
Platelets: 284 10*3/uL (ref 150–400)
RBC: 3.31 MIL/uL — ABNORMAL LOW (ref 4.22–5.81)
RDW: 14.4 % (ref 11.5–15.5)
WBC: 15.1 10*3/uL — ABNORMAL HIGH (ref 4.0–10.5)
nRBC: 0 % (ref 0.0–0.2)

## 2024-01-16 LAB — URINALYSIS, W/ REFLEX TO CULTURE (INFECTION SUSPECTED)
Bilirubin Urine: NEGATIVE
Glucose, UA: NEGATIVE mg/dL
Hgb urine dipstick: NEGATIVE
Ketones, ur: NEGATIVE mg/dL
Nitrite: NEGATIVE
Protein, ur: NEGATIVE mg/dL
Specific Gravity, Urine: 1.014 (ref 1.005–1.030)
pH: 6 (ref 5.0–8.0)

## 2024-01-16 LAB — RESP PANEL BY RT-PCR (RSV, FLU A&B, COVID)  RVPGX2
Influenza A by PCR: NEGATIVE
Influenza B by PCR: NEGATIVE
Resp Syncytial Virus by PCR: NEGATIVE
SARS Coronavirus 2 by RT PCR: NEGATIVE

## 2024-01-16 LAB — MRSA NEXT GEN BY PCR, NASAL: MRSA by PCR Next Gen: DETECTED — AB

## 2024-01-16 LAB — LACTIC ACID, PLASMA: Lactic Acid, Venous: 1.3 mmol/L (ref 0.5–1.9)

## 2024-01-16 LAB — PROTIME-INR
INR: 1.1 (ref 0.8–1.2)
Prothrombin Time: 14.6 s (ref 11.4–15.2)

## 2024-01-16 MED ORDER — INSULIN ASPART 100 UNIT/ML IJ SOLN: 0.0000 [IU] | Freq: Four times a day (QID) | INTRAMUSCULAR | Status: DC

## 2024-01-16 MED ORDER — LACTATED RINGERS IV BOLUS (SEPSIS)
1000.0000 mL | Freq: Once | INTRAVENOUS | Status: AC
  Administered 2024-01-16: 1000 mL via INTRAVENOUS

## 2024-01-16 MED ORDER — IOHEXOL 350 MG/ML SOLN
125.0000 mL | Freq: Once | INTRAVENOUS | Status: AC | PRN
  Administered 2024-01-16: 125 mL via INTRAVENOUS

## 2024-01-16 MED ORDER — VANCOMYCIN HCL 750 MG/150ML IV SOLN
750.0000 mg | Freq: Two times a day (BID) | INTRAVENOUS | Status: DC
  Administered 2024-01-17 – 2024-01-22 (×12): 750 mg via INTRAVENOUS
  Filled 2024-01-16 (×13): qty 150

## 2024-01-16 MED ORDER — ACETAMINOPHEN 650 MG RE SUPP: 650.0000 mg | Freq: Four times a day (QID) | RECTAL | Status: DC | PRN

## 2024-01-16 MED ORDER — CLOPIDOGREL BISULFATE 75 MG PO TABS
75.0000 mg | ORAL_TABLET | Freq: Every day | ORAL | Status: DC
  Administered 2024-01-17 – 2024-01-31 (×15): 75 mg via ORAL
  Filled 2024-01-16 (×15): qty 1

## 2024-01-16 MED ORDER — POLYETHYLENE GLYCOL 3350 17 G PO PACK
17.0000 g | PACK | Freq: Every day | ORAL | Status: DC | PRN
Start: 2024-01-16 — End: 2024-01-31
  Administered 2024-01-27 – 2024-01-30 (×2): 17 g via ORAL
  Filled 2024-01-16 (×2): qty 1

## 2024-01-16 MED ORDER — METRONIDAZOLE 500 MG/100ML IV SOLN
500.0000 mg | Freq: Two times a day (BID) | INTRAVENOUS | Status: DC
  Administered 2024-01-16 – 2024-01-21 (×10): 500 mg via INTRAVENOUS
  Filled 2024-01-16 (×10): qty 100

## 2024-01-16 MED ORDER — ONDANSETRON HCL 4 MG PO TABS: 4.0000 mg | ORAL_TABLET | Freq: Four times a day (QID) | ORAL | Status: DC | PRN

## 2024-01-16 MED ORDER — LEVETIRACETAM 500 MG PO TABS
1500.0000 mg | ORAL_TABLET | Freq: Two times a day (BID) | ORAL | Status: DC
  Administered 2024-01-16 – 2024-01-31 (×30): 1500 mg via ORAL
  Filled 2024-01-16 (×30): qty 3

## 2024-01-16 MED ORDER — SODIUM CHLORIDE 0.9 % IV SOLN
1.0000 g | Freq: Once | INTRAVENOUS | Status: AC
  Administered 2024-01-16: 1 g via INTRAVENOUS
  Filled 2024-01-16: qty 10

## 2024-01-16 MED ORDER — SODIUM CHLORIDE 0.9 % IV SOLN
2.0000 g | Freq: Three times a day (TID) | INTRAVENOUS | Status: DC
  Administered 2024-01-16 – 2024-01-26 (×29): 2 g via INTRAVENOUS
  Filled 2024-01-16 (×30): qty 12.5

## 2024-01-16 MED ORDER — CHLORHEXIDINE GLUCONATE CLOTH 2 % EX PADS
6.0000 | MEDICATED_PAD | Freq: Every day | CUTANEOUS | Status: DC
  Administered 2024-01-16 – 2024-01-28 (×13): 6 via TOPICAL

## 2024-01-16 MED ORDER — VALPROIC ACID 250 MG/5ML PO SOLN
750.0000 mg | Freq: Two times a day (BID) | ORAL | Status: DC
  Administered 2024-01-16 – 2024-01-19 (×7): 750 mg via ORAL
  Administered 2024-01-20: 250 mg via ORAL
  Administered 2024-01-20 – 2024-01-31 (×22): 750 mg via ORAL
  Filled 2024-01-16 (×33): qty 15

## 2024-01-16 MED ORDER — VANCOMYCIN HCL 500 MG/100ML IV SOLN
500.0000 mg | Freq: Once | INTRAVENOUS | Status: AC
  Administered 2024-01-16: 500 mg via INTRAVENOUS
  Filled 2024-01-16: qty 100

## 2024-01-16 MED ORDER — ENOXAPARIN SODIUM 40 MG/0.4ML IJ SOSY
40.0000 mg | PREFILLED_SYRINGE | INTRAMUSCULAR | Status: DC
  Administered 2024-01-16 – 2024-01-30 (×15): 40 mg via SUBCUTANEOUS
  Filled 2024-01-16 (×15): qty 0.4

## 2024-01-16 MED ORDER — LACTATED RINGERS IV SOLN: INTRAVENOUS | Status: DC

## 2024-01-16 MED ORDER — ACETAMINOPHEN 325 MG PO TABS
650.0000 mg | ORAL_TABLET | Freq: Four times a day (QID) | ORAL | Status: DC | PRN
  Administered 2024-01-23 – 2024-01-30 (×4): 650 mg via ORAL
  Filled 2024-01-16 (×5): qty 2

## 2024-01-16 MED ORDER — VANCOMYCIN HCL IN DEXTROSE 1-5 GM/200ML-% IV SOLN
1000.0000 mg | Freq: Once | INTRAVENOUS | Status: AC
  Administered 2024-01-16: 1000 mg via INTRAVENOUS
  Filled 2024-01-16: qty 200

## 2024-01-16 MED ORDER — ONDANSETRON HCL 4 MG/2ML IJ SOLN: 4.0000 mg | Freq: Four times a day (QID) | INTRAMUSCULAR | Status: DC | PRN

## 2024-01-16 NOTE — Assessment & Plan Note (Addendum)
 Presenting with tachycardia heart rate 101-123, leukocytosis of 15.1, tachypnea respiratory 20-22.  Normal lactic acid 1.3.  Nursing home reports fever- 103, he was given Tylenol  prior to arrival.  He has chronic left left lower extremity wounds which may be etiology of infection.  Left foot x-ray-no evidence of osteomyelitis.  UA with trace leukocytes not quite convincing as etiology. Abdomen is benign.  COVID-negative. -2 L bolus given, cont LR 100cc/hr x 15hrs - Continue broad-spectrum antibiotics IV vancomycin  cefepime  and metronidazole  - Follow-up blood and urine cultures

## 2024-01-16 NOTE — Sepsis Progress Note (Signed)
 Code Sepsis protocol being monitored by eLink.

## 2024-01-16 NOTE — ED Notes (Signed)
 Attempted to call report. Will make another attempt.

## 2024-01-16 NOTE — Assessment & Plan Note (Signed)
 Blood pressure soft. -Hold carvedilol  6.25 mg twice daily,

## 2024-01-16 NOTE — Assessment & Plan Note (Signed)
 Not on medication. - HgbA1c - SSi- S

## 2024-01-16 NOTE — Assessment & Plan Note (Signed)
Nursing home resident

## 2024-01-16 NOTE — Assessment & Plan Note (Addendum)
 Was quite somnolent in the ED, now more awake. Baseline dementia.  Unable to determine severity of patient's baseline dementia.  Called nursing home -no response.  Likely secondary to  SIRs.  -IV antibiotics, IV fluids

## 2024-01-16 NOTE — Assessment & Plan Note (Signed)
 Resume oral Keppra , with Percocet as IV medication, pending improvement in mental status.

## 2024-01-16 NOTE — ED Notes (Signed)
Attempted to call report. Will call back.

## 2024-01-16 NOTE — ED Provider Notes (Signed)
 [place] EMERGENCY DEPARTMENT AT [facility] Provider Note   CSN: [id number] Arrival date & time: 01/16/24  1212     Patient presents with: Fever and Tremors   Jacob Mcgrath. is a 68 y.o. male.   Patient is a 68 year old male who presents emergency department from his long-term care facility secondary to fever and tremors which began yesterday.  Patient does have a history of dementia and is unable to provide his own history.  It was noted by the facility the patient had a fever of 103 Fahrenheit this morning.  He was given a dose of Tylenol  and was afebrile on presentation to the emergency department.  He was tachycardic on presentation though.  Patient does state that he is having no active pain at this point.   Fever      Prior to Admission medications   Medication Sig Start Date End Date Taking? Authorizing Provider  aspirin  EC 81 MG tablet Take 81 mg by mouth daily. Swallow whole.    [provider]  atorvastatin  (LIPITOR ) 80 MG tablet Take 80 mg by mouth at bedtime. 08/14/21   [provider]  Baclofen  5 MG TABS Take 1 tablet by mouth in the morning, at noon, and at bedtime. 06/16/23   [provider]  carvedilol  (COREG ) 6.25 MG tablet Take 1 tablet (6.25 mg total) by mouth 2 (two) times daily with a meal. 12/16/23   Dennise Lavada POUR, MD  clopidogrel  (PLAVIX ) 75 MG tablet Take 1 tablet (75 mg total) by mouth daily with breakfast. Patient taking differently: Take 75 mg by mouth daily. 10/11/22   Singh, Prashant K, MD  DULoxetine  (CYMBALTA ) 30 MG capsule Take 30 mg by mouth daily. 07/23/22   [provider]  guaiFENesin (MUCINEX) 600 MG 12 hr tablet Take 600 mg by mouth 2 (two) times daily as needed for cough.    [provider]  levETIRAcetam  (KEPPRA ) 750 MG tablet Take 2 tablets (1,500 mg total) by mouth 2 (two) times daily. 12/16/23   Singh, Prashant K, MD  melatonin 3 MG TABS tablet Take 2 tablets (6 mg total) by mouth  at bedtime. 03/03/23   Pearlean Manus, MD  Multiple Vitamin (MULTIVITAMIN) tablet Take 1 tablet by mouth daily.    [provider]  pantoprazole  (PROTONIX ) 40 MG tablet Take 1 tablet (40 mg total) by mouth daily. 10/11/22   Singh, Prashant K, MD  PARoxetine  (PAXIL ) 40 MG tablet Take 40 mg by mouth daily. 06/26/23   [provider]  tamsulosin  (FLOMAX ) 0.4 MG CAPS capsule Take 1 capsule (0.4 mg total) by mouth daily. 10/11/22   Singh, Prashant K, MD  valproic  acid (DEPAKENE ) 250 MG/5ML solution Take 15 mLs (750 mg total) by mouth 2 (two) times daily. 12/16/23   Singh, Prashant K, MD    Allergies: Amoxicillin, Ampicillin, and Tetracyclines & related    Review of Systems  Constitutional:  Positive for fever.  All other systems reviewed and are negative.   Updated Vital Signs BP 106/79 (BP Location: Left Arm)   Pulse (!) 114   Temp 98.8 F (37.1 C) (Oral)   Resp (!) 22   Ht 5' 9 (1.753 m)   Wt 62.3 kg   SpO2 94%   BMI 20.28 kg/m   Physical Exam Vitals and nursing note reviewed.  Constitutional:      General: He is not in acute distress.    Appearance: Normal appearance. He is not ill-appearing.  HENT:  Head: Normocephalic and atraumatic.     Nose: Nose normal.     Mouth/Throat:     Mouth: Mucous membranes are moist.   Eyes:     Extraocular Movements: Extraocular movements intact.     Conjunctiva/sclera: Conjunctivae normal.     Pupils: Pupils are equal, round, and reactive to light.    Cardiovascular:     Rate and Rhythm: Regular rhythm. Tachycardia present.     Pulses: Normal pulses.     Heart sounds: Normal heart sounds. No murmur heard.    No gallop.  Pulmonary:     Effort: Pulmonary effort is normal. No respiratory distress.     Breath sounds: Normal breath sounds. No stridor. No wheezing, rhonchi or rales.  Abdominal:     General: Abdomen is flat. Bowel sounds are normal. There is no distension.     Palpations: Abdomen is soft.     Tenderness:  There is no abdominal tenderness. There is no guarding.   Musculoskeletal:        General: No swelling or deformity. Normal range of motion.     Cervical back: Normal range of motion and neck supple.     Right lower leg: No edema.     Left lower leg: No edema.   Skin:    General: Skin is warm and dry.     Comments: Pressure ulcers noted to posterior aspect of left foot with no surrounding erythema or discharge   Neurological:     General: No focal deficit present.     Mental Status: He is alert and oriented to person, place, and time. Mental status is at baseline.   Psychiatric:        Mood and Affect: Mood normal.        Behavior: Behavior normal.        Thought Content: Thought content normal.        Judgment: Judgment normal.     (all labs ordered are listed, but only abnormal results are displayed) Labs Reviewed  COMPREHENSIVE METABOLIC PANEL WITH GFR - Abnormal; Notable for the following components:      Result Value   Glucose, Bld 123 (*)    Calcium  8.6 (*)    Albumin 2.6 (*)    All other components within normal limits  CBC WITH DIFFERENTIAL/PLATELET - Abnormal; Notable for the following components:   WBC 15.1 (*)    RBC 3.31 (*)    Hemoglobin 9.6 (*)    HCT 30.0 (*)    Neutro Abs 9.5 (*)    Monocytes Absolute 2.1 (*)    All other components within normal limits  URINALYSIS, W/ REFLEX TO CULTURE (INFECTION SUSPECTED) - Abnormal; Notable for the following components:   Leukocytes,Ua TRACE (*)    Bacteria, UA RARE (*)    All other components within normal limits  RESP PANEL BY RT-PCR (RSV, FLU A&B, COVID)  RVPGX2  CULTURE, BLOOD (ROUTINE X 2)  CULTURE, BLOOD (ROUTINE X 2)  URINE CULTURE  LACTIC ACID, PLASMA  PROTIME-INR    EKG: EKG Interpretation Date/Time:  Monday January 16 2024 12:52:17 EDT Ventricular Rate:  115 PR Interval:  146 QRS Duration:  75 QT Interval:  334 QTC Calculation: 462 R Axis:   78  Text Interpretation: Sinus tachycardia Borderline  repolarization abnormality Artifact in lead(s) I II aVR aVL Confirmed by Yolande Charleston [phone]) on 01/16/2024 2:12:36 PM  Radiology: [facility] Chest Port 1 View Result Date: 01/16/2024 CLINICAL DATA:  Questionable sepsis.  Fever and tremors.  EXAM: PORTABLE CHEST 1 VIEW COMPARISON:  12/13/2023 and CT chest 10/21/2021. FINDINGS: Trachea is midline. Heart size normal. Thoracic aorta is calcified. Mild linear scarring in the left lower lobe. Lungs are otherwise clear. No pleural fluid. IMPRESSION: No acute findings. Electronically Signed   By: Newell Eke M.D.   On: 01/16/2024 14:29   DG Foot 2 Views Left Result Date: 01/16/2024 CLINICAL DATA:  Sepsis, fever and tremors. EXAM: LEFT FOOT - 2 VIEW COMPARISON:  None Available. FINDINGS: Hallux valgus. Mild degenerative changes of the first metatarsophalangeal joint. Osteopenia. Small soft tissue wound along the plantar aspect of the posterior calcaneus. No underlying osseous erosion to suggest osteomyelitis. IMPRESSION: 1. No evidence of osteomyelitis. 2. 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 3. Osteopenia. Electronically Signed   By: Newell Eke M.D.   On: 01/16/2024 14:29     .Critical Care  Performed by: Jacob Lonni BIRCH, PA-C Authorized by: Jacob Lonni BIRCH, PA-C   Critical care provider statement:    Critical care time (minutes):  35   Critical care was necessary to treat or prevent imminent or life-threatening deterioration of the following conditions:  Sepsis   Critical care was time spent personally by me on the following activities:  Development of treatment plan with patient or surrogate, discussions with consultants, evaluation of patient's response to treatment, examination of patient, ordering and review of laboratory studies, ordering and review of radiographic studies, ordering and performing treatments and interventions, pulse oximetry, re-evaluation of patient's condition and review of old charts    I assumed direction of critical care for this patient from another provider in my specialty: no     Care discussed with: admitting provider      Medications Ordered in the ED  lactated ringers  infusion (has no administration in time range)  lactated ringers  bolus 1,000 mL (1,000 mLs Intravenous New Bag/Given 01/16/24 1256)    And  lactated ringers  bolus 1,000 mL (0 mLs Intravenous Stopped 01/16/24 1436)  cefTRIAXone  (ROCEPHIN ) 1 g in sodium chloride  0.9 % 100 mL IVPB (0 g Intravenous Stopped 01/16/24 1436)                                    Medical Decision Making Amount and/or Complexity of Data Reviewed Labs: ordered. Radiology: ordered.  Risk Prescription drug management. Decision regarding hospitalization.   This patient presents to the ED for concern of fever, tremors, this involves an extensive number of treatment options, and is a complaint that carries with it a high risk of complications and morbidity.  The differential diagnosis includes abscess, pneumonia, urinary tract infection, cellulitis, abscess, necrotizing fasciitis, osteomyelitis   Co morbidities that complicate the patient evaluation  Dementia, PAD   Additional history obtained:  Additional history obtained from sending facility External records from outside source obtained and reviewed including medical records   Lab Tests:  I Ordered, and personally interpreted labs.  The pertinent results include: Leukocytosis, anemia, normal electrolytes, normal kidney and liver function, unremarkable urinalysis, normal lactic acid   Imaging Studies ordered:  I ordered imaging studies including chest x-ray, x-ray of left foot, CTA with runoff I independently visualized and interpreted imaging which showed no acute cardiopulmonary process, no acute process noted on the x-ray of the left foot, occlusion of the left SFA graft with reconstitution distally of the native popliteal I agree with the radiologist  interpretation   Cardiac Monitoring: / EKG:  The patient was maintained on a cardiac monitor.  I personally viewed and interpreted the cardiac monitored which showed an underlying rhythm of: Sinus tachycardia, no ST/T wave changes, no ischemic changes, no STEMI   Consultations Obtained:  I requested consultation with the hospitalist,  and discussed lab and imaging findings as well as pertinent plan - they recommend: Admission   Problem List / ED Course / Critical interventions / Medication management  Patient does remain stable at this time.  Will plan for admission to the hospital service given the fact that the patient was septic on initial presentation.  He does have some wounds to the posterior and lateral aspect of the left foot with some mild surrounding erythema.  No obvious indication for necrotizing fasciitis, abscess or infection or osteomyelitis at this point.  He has been covered with vancomycin  and Rocephin .  He does have a history of a MRSA infection.  Urinalysis demonstrates no obvious indication for urinary tract infection.  No other acute surgical process was noted within the abdomen and pelvis.  Chest x-ray demonstrated no obvious indication for pneumonia.  Viral swabs were negative.  Patient was evaluated by attending physician who does agree to plan for admission at this time.  CTA of the left lower extremity demonstrated an occlusion of his SFA stent but does have reconstitution just distally along the native popliteal with intact dorsalis pedis.  Have discussed patient case with Dr. FORBES Carwin with the hospitalist service who has excepted for admission at this time. I ordered medication including Rocephin , vancomycin , IV fluids for sepsis Reevaluation of the patient after these medicines showed that the patient improved I have reviewed the patients home medicines and have made adjustments as needed   Social Determinants of Health:  None   Test / Admission -  Considered:  Admission     Final diagnoses:  None    ED Discharge Orders     None          Jacob Mcgrath 01/16/24 1652    Yolande Lamar BROCKS, MD 01/17/24 [phone]

## 2024-01-16 NOTE — ED Notes (Signed)
 Attempted to call report to ICU and nurse not available to take pt at this time. Will inform ED charge.

## 2024-01-16 NOTE — ED Triage Notes (Signed)
 Pt arrived via [facility] from [facility] with complaints of fever and tremors that started yesterday. Hx dementia. Temp 98.8 on arrival. Facility reports temp was 103 this morning and pt was given tylenol  at 1130.

## 2024-01-16 NOTE — Assessment & Plan Note (Addendum)
 Left lower extremity decubitus ulcers-on lateral side.  Unstageable.  Black eschar present.  Possible cellulitis, but due to hyperpigmentation hard to tell, but no fluctuance or swelling appreciated.  Left foot x-ray-acute of acute abnormality. -Broad-spectrum antibiotics for now -General Surgery consult if debridement would be beneficial - Per MAR-he is on a 14 day course of  IV Ertepenem for diabetic foot ulcer

## 2024-01-16 NOTE — Assessment & Plan Note (Addendum)
 At this time no suggestion of ischemia. Follows with vascular surgeon Dr. Norita visit 04/2023.  - CT angio bifemoral- Occluded left SFA stent extending into the proximal popliteal artery. Collateral reconstitution of a diminutive native left popliteal above the knee, which is patent distally. Occluded left posterior tibial artery to the mid calf. Patent left external iliac artery stent. Short-segment high-grade stenosis mid right external iliac artery. - Resume Plavix 

## 2024-01-16 NOTE — Plan of Care (Signed)

## 2024-01-16 NOTE — ED Notes (Signed)
Second IV established.

## 2024-01-16 NOTE — Assessment & Plan Note (Deleted)
 Resume oral Keppra , with Percocet as IV medication, pending improvement in mental status.

## 2024-01-16 NOTE — H&P (Signed)
 History and Physical    Jacob Mcgrath. FMW:[id number] DOB: 08-28-1955 DOA: 01/16/2024  PCP: [name], MD   Patient coming from: [facility]  I have personally briefly reviewed patient's old medical records in [facility]  Chief Complaint: Fever, Tremors  HPI: Jacob Mcgrath. is a 68 y.o. male with medical history significant for dementia, diabetes mellitus, hypertension, status epilepticus, depression, stroke. Patient was brought to the ED from nursing home reports of fevers, tremors that started yesterday.  Facility reports fever of 103 this morning, he was given Tylenol  prior to arrival. At the time of my evaluation patient is somnolent, unable to provide history.  I called the nursing home to obtain history, and get an idea of the patient's baseline considering his diagnosis of dementia, but no response.  Recently hospitalized 5/15 to 5/23-for status epilepticus, requiring intubation for seizure control, he was discharged on Depakote and Keppra .  ED Course: Temperature 98.8.  Heart rate 101-147.  O2 sats greater than 94% on room air. WBC 15.1.  Lactic acid 1.3. UA with trace leukocytes rare bacteria. Chest x-ray clear. Left foot x-ray-no evidence of osteomyelitis. CT angio + bifemoral-proximal popliteal artery, collateral reconstitution of diminutive native left popliteal above the knee which is patent distally, occluded left posterior tibial artery to the mid calf.  Left external iliac artery stent short segment high-grade stenosis mid right external iliac artery. IV vancomycin  and cefepime  started.  2 L bolus given.  Review of Systems: As per HPI all other systems reviewed and negative.  Past Medical History:  Diagnosis Date   Dementia (HCC)    Diabetes mellitus without complication (HCC)    Hypertension    Stroke [facility])     Past Surgical History:  Procedure Laterality Date   ABDOMINAL AORTOGRAM W/LOWER EXTREMITY N/A 10/04/2022   Procedure:  ABDOMINAL AORTOGRAM W/LOWER EXTREMITY;  Surgeon: Eliza Lonni RAMAN, MD;  Location: [facility] INVASIVE CV LAB;  Service: Cardiovascular;  Laterality: N/A;   ABDOMINAL AORTOGRAM W/LOWER EXTREMITY N/A 10/08/2022   Procedure: ABDOMINAL AORTOGRAM W/LOWER EXTREMITY;  Surgeon: Magda Debby SAILOR, MD;  Location: [facility] INVASIVE CV LAB;  Service: Cardiovascular;  Laterality: N/A;   PERIPHERAL VASCULAR INTERVENTION  10/04/2022   Procedure: PERIPHERAL VASCULAR INTERVENTION;  Surgeon: Eliza Lonni RAMAN, MD;  Location: [facility] INVASIVE CV LAB;  Service: Cardiovascular;;   PERIPHERAL VASCULAR INTERVENTION Left 10/08/2022   Procedure: PERIPHERAL VASCULAR INTERVENTION;  Surgeon: Magda Debby SAILOR, MD;  Location: [facility] INVASIVE CV LAB;  Service: Cardiovascular;  Laterality: Left;   TONSILLECTOMY       reports that he has been smoking cigarettes. He does not have any smokeless tobacco history on file. He reports that he does not currently use alcohol. He reports that he does not currently use drugs.  Allergies  Allergen Reactions   Amoxicillin Other (See Comments)    Unknown reaction per facility   Ampicillin Other (See Comments)    Unknown reaction per facility   Tetracyclines & Related Rash   Unable to obtain family history, due to baseline dementia and altered mental status.  Prior to Admission medications   Medication Sig Start Date End Date Taking? Authorizing Provider  aspirin  EC 81 MG tablet Take 81 mg by mouth daily. Swallow whole.    [provider]  atorvastatin  (LIPITOR ) 80 MG tablet Take 80 mg by mouth at bedtime. 08/14/21   [provider]  Baclofen  5 MG TABS Take 1 tablet by mouth in the morning, at noon, and at bedtime. 06/16/23  [provider]  carvedilol  (COREG ) 6.25 MG tablet Take 1 tablet (6.25 mg total) by mouth 2 (two) times daily with a meal. 12/16/23   Dennise Lavada POUR, MD  clopidogrel  (PLAVIX ) 75 MG tablet Take 1 tablet (75 mg total) by mouth daily with  breakfast. Patient taking differently: Take 75 mg by mouth daily. 10/11/22   Singh, Prashant K, MD  DULoxetine  (CYMBALTA ) 30 MG capsule Take 30 mg by mouth daily. 07/23/22   [provider]  guaiFENesin (MUCINEX) 600 MG 12 hr tablet Take 600 mg by mouth 2 (two) times daily as needed for cough.    [provider]  levETIRAcetam  (KEPPRA ) 750 MG tablet Take 2 tablets (1,500 mg total) by mouth 2 (two) times daily. 12/16/23   Singh, Prashant K, MD  melatonin 3 MG TABS tablet Take 2 tablets (6 mg total) by mouth at bedtime. 03/03/23   Pearlean Manus, MD  Multiple Vitamin (MULTIVITAMIN) tablet Take 1 tablet by mouth daily.    [provider]  pantoprazole  (PROTONIX ) 40 MG tablet Take 1 tablet (40 mg total) by mouth daily. 10/11/22   Singh, Prashant K, MD  PARoxetine  (PAXIL ) 40 MG tablet Take 40 mg by mouth daily. 06/26/23   [provider]  tamsulosin  (FLOMAX ) 0.4 MG CAPS capsule Take 1 capsule (0.4 mg total) by mouth daily. 10/11/22   Singh, Prashant K, MD  valproic  acid (DEPAKENE ) 250 MG/5ML solution Take 15 mLs (750 mg total) by mouth 2 (two) times daily. 12/16/23   Dennise Lavada POUR, MD    Physical Exam: Exam limited due to altered mental status, dementia Vitals:   01/16/24 1445 01/16/24 1515 01/16/24 1530 01/16/24 1633  BP: (!) 86/68 108/89 107/87 115/85  Pulse: (!) 101 (!) 156  (!) 114  Resp:    (!) 22  Temp:    98.1 F (36.7 C)  TempSrc:    Oral  SpO2: 98% (!) 68%  95%  Weight:      Height:        Constitutional: Somnolent, moaning intermittently on exam , chronically ill-appearing Vitals:   01/16/24 1445 01/16/24 1515 01/16/24 1530 01/16/24 1633  BP: (!) 86/68 108/89 107/87 115/85  Pulse: (!) 101 (!) 156  (!) 114  Resp:    (!) 22  Temp:    98.1 F (36.7 C)  TempSrc:    Oral  SpO2: 98% (!) 68%  95%  Weight:      Height:       Eyes: Refusing pupillary exam ENMT: Mucous membranes are dry.  Neck: normal, supple, no masses, no  thyromegaly Respiratory: Normal respiratory effort. No accessory muscle use.  Cardiovascular: Tachycardic, regular rate and rhythm, No extremity edema.  Extremities warm. Abdomen: no tenderness, no masses palpated. No hepatosplenomegaly.  Musculoskeletal: no clubbing / cyanosis.  Appears to have contractures to his left hand, and left knee  Skin: Skin of bilateral lower extremity consistent with sequelae of peripheral artery disease.  Unstageable pressure ulcer to left foot- heel and lateral aspect.  With black eschar.  No drainage.  No open wounds.  No appreciable swelling, no fluctuance.  Due to skin pigmentation unable to tell if surrounding cellulitis.  Questionable tenderness. Neurologic: Limited exam, moaning intermittently.   Moving all extremities, but likely contracture to left knee Psychiatric: Somnolent.  Limited exam.      Labs on Admission: I have personally reviewed following labs and imaging studies  CBC: Recent Labs  Lab 01/16/24 1244  WBC 15.1*  NEUTROABS 9.5*  HGB 9.6*  HCT 30.0*  MCV 90.6  PLT 284   Basic Metabolic Panel: Recent Labs  Lab 01/16/24 1244  NA 139  K 4.2  CL 105  CO2 25  GLUCOSE 123*  BUN 12  CREATININE 0.84  CALCIUM  8.6*   GFR: Estimated Creatinine Clearance: 75.2 mL/min (by C-G formula based on SCr of 0.84 mg/dL). Liver Function Tests: Recent Labs  Lab 01/16/24 1244  AST 31  ALT 17  ALKPHOS 61  BILITOT 0.6  PROT 7.4  ALBUMIN 2.6*   Coagulation Profile: Recent Labs  Lab 01/16/24 1244  INR 1.1   Urine analysis:    Component Value Date/Time   COLORURINE YELLOW 01/16/2024 1342   APPEARANCEUR CLEAR 01/16/2024 1342   LABSPEC 1.014 01/16/2024 1342   PHURINE 6.0 01/16/2024 1342   GLUCOSEU NEGATIVE 01/16/2024 1342   HGBUR NEGATIVE 01/16/2024 1342   BILIRUBINUR NEGATIVE 01/16/2024 1342   KETONESUR NEGATIVE 01/16/2024 1342   PROTEINUR NEGATIVE 01/16/2024 1342   NITRITE NEGATIVE 01/16/2024 1342   LEUKOCYTESUR TRACE (A)  01/16/2024 1342    Radiological Exams on Admission: CT Angio Aortobifemoral W and/or Wo Contrast Result Date: 01/16/2024 CLINICAL DATA:  Left lower extremity ischemia, fever, tremors, dementia EXAM: CT ANGIOGRAPHY OF ABDOMINAL AORTA WITH ILIOFEMORAL RUNOFF TECHNIQUE: Multidetector CT imaging of the abdomen, pelvis and lower extremities was performed using the standard protocol during bolus administration of intravenous contrast. Multiplanar CT image reconstructions and MIPs were obtained to evaluate the vascular anatomy. RADIATION DOSE REDUCTION: This exam was performed according to the departmental dose-optimization program which includes automated exposure control, adjustment of the mA and/or kV according to patient size and/or use of iterative reconstruction technique. CONTRAST:  OMNIPAQUE  IOHEXOL  350 MG/ML SOLN COMPARISON:  None Available. FINDINGS: VASCULAR Aorta: Moderate calcified plaque. No aneurysm, dissection, or stenosis. Celiac: Partially calcified ostial plaque with only mild stenosis, patent distally with classic trifurcation anatomy. SMA: Partially calcified ostial plaque over length of at least 2.1 cm resulting in mild stenosis, patent distally with classic branch anatomy. Renals: Single bilaterally, both with calcified ostial plaque resulting in short segment mild stenosis, patent distally. IMA: Patent without evidence of aneurysm, dissection, vasculitis or significant stenosis. RIGHT Lower Extremity Inflow: Common iliac eccentric calcified atheromatous plaque throughout. There is stenosis at its bifurcation extending into the proximal common and internal iliac arteries of at least mild severity. There is a short-segment high-grade stenosis in the mid external iliac artery, which is diffusely atheromatous. Outflow: Common femoral heavily atheromatous, with mild diffuse narrowing. Deep femoral branches patent, atheromatous. SFA calcified plaque proximally with only mild stenosis, patent  distally. Popliteal artery scattered calcified plaque above the knee, patent distally. Runoff: Calcified plaque proximally but apparently contiguous distal trifurcation runoff. LEFT Lower Extremity Inflow: Common iliac moderately atheromatous, with stenosis at its bifurcation of at least moderate severity over short segment into the proximal external and internal iliac arteries. Patent stent through the more distal external iliac artery. Outflow: Common femoral moderately atheromatous, patent. Patent stent extends across the SFA origin. There is a distal SFA stent extending into the proximal popliteal artery which is occluded throughout almost its entire length. There is collateral reconstitution of a diminutive native popliteal above the knee, which is patent distally. Runoff: Posterior tibial occludes mid calf. Anterior tibial is patent across the foot as dorsalis pedis. Peroneal is diminutive, with scattered calcified plaque. Veins: No obvious venous abnormality within the limitations of this arterial phase study. Review of the MIP images confirms the above findings. NON-VASCULAR Lower  chest: No pleural or pericardial effusion. Coronary and aortic calcifications. Coarse interstitial opacities in the dependent aspect of both lung bases. Hepatobiliary: Small hyperdensity in the dependent aspect of the nondilated gallbladder suggesting small calcified gallstone. No focal liver lesion or biliary ductal dilatation. Pancreas: Unremarkable. No pancreatic ductal dilatation or surrounding inflammatory changes. Spleen: Normal in size without focal abnormality. Adrenals/Urinary Tract: No adrenal mass. Kidneys symmetric in size with normal enhancement. No hydronephrosis. 1.4 cm probable cortical cyst, left upper pole; no follow-up origin. Urinary bladder is moderately distended with mild circumferential wall thickening and a few small anterior diverticula. Stomach/Bowel: Stomach is nondistended, unremarkable. Small bowel  decompressed. Normal appendix. The colon is partially distended, without acute finding Lymphatic: Left inguinal and external iliac lymph nodes up to a 1.2 cm short axis diameter. No mesenteric or retroperitoneal adenopathy. Reproductive: Mild prostate enlargement with central calcifications. Other: No ascites.  No free air. Musculoskeletal: Left hip arthroplasty .  Advanced right hip DJD. IMPRESSION: 1. Occluded left SFA stent extending into the proximal popliteal artery. 2. Collateral reconstitution of a diminutive native left popliteal above the knee, which is patent distally. 3. Occluded left posterior tibial artery to the mid calf. 4. Patent left external iliac artery stent. 5. Short-segment high-grade stenosis mid right external iliac artery. 6. Cholelithiasis. 7. Mild prostate enlargement with mild bladder wall thickening and diverticula. 8.  Aortic Atherosclerosis (ICD10-I70.0). Electronically Signed   By: JONETTA Faes M.D.   On: 01/16/2024 16:18   DG Chest Port 1 View Result Date: 01/16/2024 CLINICAL DATA:  Questionable sepsis.  Fever and tremors. EXAM: PORTABLE CHEST 1 VIEW COMPARISON:  12/13/2023 and CT chest 10/21/2021. FINDINGS: Trachea is midline. Heart size normal. Thoracic aorta is calcified. Mild linear scarring in the left lower lobe. Lungs are otherwise clear. No pleural fluid. IMPRESSION: No acute findings. Electronically Signed   By: Newell Eke M.D.   On: 01/16/2024 14:29   DG Foot 2 Views Left Result Date: 01/16/2024 CLINICAL DATA:  Sepsis, fever and tremors. EXAM: LEFT FOOT - 2 VIEW COMPARISON:  None Available. FINDINGS: Hallux valgus. Mild degenerative changes of the first metatarsophalangeal joint. Osteopenia. Small soft tissue wound along the plantar aspect of the posterior calcaneus. No underlying osseous erosion to suggest osteomyelitis. IMPRESSION: 1. No evidence of osteomyelitis. 2. 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 3. Osteopenia. Electronically  Signed   By: Newell Eke M.D.   On: 01/16/2024 14:29   EKG: Independently reviewed.  Sinus tachycardia rate 115.  QTc 462.  No significant change from prior.  Assessment/Plan Principal Problem:   SIRS (systemic inflammatory response syndrome) (HCC) Active Problems:   Stroke (HCC)   Decubitus ulcers   Depression   Controlled type 2 diabetes mellitus without complication, without long-term current use of insulin  (HCC)   Seizure (HCC)   Dementia without behavioral disturbance (HCC)   Acute metabolic encephalopathy   Essential hypertension   PAD (peripheral artery disease) (HCC)  Assessment and Plan: * SIRS (systemic inflammatory response syndrome) (HCC) Presenting with tachycardia heart rate 101-123, leukocytosis of 15.1, tachypnea respiratory 20-22.  Normal lactic acid 1.3.  Nursing home reports fever- 103, he was given Tylenol  prior to arrival.  He has chronic left left lower extremity wounds which may be etiology of infection.  Left foot x-ray-no evidence of osteomyelitis.  UA with trace leukocytes not quite convincing as etiology. Abdomen is benign.  COVID-negative. -2 L bolus given, cont LR 100cc/hr x 15hrs - Continue broad-spectrum antibiotics IV vancomycin  cefepime  and metronidazole  -  Follow-up blood and urine cultures  Decubitus ulcers Left lower extremity decubitus ulcers-on lateral side.  Unstageable.  Black eschar present.  Possible cellulitis, but due to hyperpigmentation hard to tell, but no fluctuance or swelling appreciated.  Left foot x-ray-acute of acute abnormality. -Broad-spectrum antibiotics for now -General Surgery consult if debridement would be beneficial - Per MAR-he is on a 14 day course of  IV Ertepenem for diabetic foot ulcer  Acute metabolic encephalopathy Was quite somnolent in the ED, now more awake. Baseline dementia.  Unable to determine severity of patient's baseline dementia.  Called nursing home -no response.  Likely secondary to  SIRs.  -IV  antibiotics, IV fluids  PAD (peripheral artery disease) (HCC) At this time no suggestion of ischemia. Follows with vascular surgeon Dr. Norita visit 04/2023.  - CT angio bifemoral- Occluded left SFA stent extending into the proximal popliteal artery. Collateral reconstitution of a diminutive native left popliteal above the knee, which is patent distally. Occluded left posterior tibial artery to the mid calf. Patent left external iliac artery stent. Short-segment high-grade stenosis mid right external iliac artery. - Resume Plavix   Essential hypertension Blood pressure soft. -Hold carvedilol  6.25 mg twice daily,  Dementia without behavioral disturbance (HCC) Nursing home resident.  Controlled type 2 diabetes mellitus without complication, without long-term current use of insulin  (HCC) Not on medication. - HgbA1c - SSi- S  Seizure (HCC) Resume oral Keppra , with Percocet as IV medication, pending improvement in mental status.   DVT prophylaxis:  Lovenox  Code Status:  FULL code- Most Form at bedside Family Communication: None at bedside Disposition Plan: ~ 2 days Consults called: None Admission status: Inpt Stepdown I certify that at the point of admission it is my clinical judgment that the patient will require inpatient hospital care spanning beyond 2 midnights from the point of admission due to high intensity of service, high risk for further deterioration and high frequency of surveillance required.   Author: Tully FORBES Carwin, MD 01/16/2024 9:22 PM  For on call review [email or web address].

## 2024-01-16 NOTE — Consult Note (Signed)
 Pharmacy Antibiotic Note  Jacob Mcgrath. is a 68 y.o. male admitted on 01/16/2024 with sepsis.  Pharmacy has been consulted for cefepime  and vancomycin  dosing.  Vancomycin  1g IV x 1 given 6/23 @ 1518  Plan: Give vancomycin  500 mg IV x 1 to complete 1500 mg loading dose, then start vancomycin  750 mg IV every 12 hours Estimated AUC 500, Cmin 14.5 Wt 62.3 kg, Scr 0.84, Vd 0.72 Vancomycin  levels at steady state or as clinically indicated Start cefepime  2 grams IV every 8 hours Follow renal function and cultures for adjustments  Height: 5' 9 (175.3 cm) Weight: 62.3 kg (137 lb 5.6 oz) IBW/kg (Calculated) : 70.7  Temp (24hrs), Avg:98.5 F (36.9 C), Min:98.1 F (36.7 C), Max:98.8 F (37.1 C)  Recent Labs  Lab 01/16/24 1244  WBC 15.1*  CREATININE 0.84  LATICACIDVEN 1.3    Estimated Creatinine Clearance: 75.2 mL/min (by C-G formula based on SCr of 0.84 mg/dL).    Allergies  Allergen Reactions   Amoxicillin Other (See Comments)    Unknown reaction per facility   Ampicillin Other (See Comments)    Unknown reaction per facility   Tetracyclines & Related Rash    Antimicrobials this admission: Vancomycin  6/23 >>  cefepime  6/23 >>   Microbiology results: 6/23 BCx: pending 6/23 UCx: pending  6/23 MRSA PCR: pending  Thank you for allowing pharmacy to be a part of this patient's care.  Jacob Mcgrath, PharmD 01/16/2024 9:10 PM

## 2024-01-17 ENCOUNTER — Encounter (HOSPITAL_COMMUNITY): Payer: Self-pay | Admitting: Internal Medicine

## 2024-01-17 DIAGNOSIS — E119 Type 2 diabetes mellitus without complications: Secondary | ICD-10-CM | POA: Diagnosis not present

## 2024-01-17 DIAGNOSIS — L97529 Non-pressure chronic ulcer of other part of left foot with unspecified severity: Secondary | ICD-10-CM | POA: Diagnosis present

## 2024-01-17 DIAGNOSIS — R6510 Systemic inflammatory response syndrome (SIRS) of non-infectious origin without acute organ dysfunction: Secondary | ICD-10-CM | POA: Diagnosis not present

## 2024-01-17 DIAGNOSIS — I739 Peripheral vascular disease, unspecified: Secondary | ICD-10-CM | POA: Diagnosis not present

## 2024-01-17 DIAGNOSIS — G9341 Metabolic encephalopathy: Secondary | ICD-10-CM | POA: Diagnosis not present

## 2024-01-17 LAB — GLUCOSE, CAPILLARY
Glucose-Capillary: 57 mg/dL — ABNORMAL LOW (ref 70–99)
Glucose-Capillary: 104 mg/dL — ABNORMAL HIGH (ref 70–99)
Glucose-Capillary: 95 mg/dL (ref 70–99)
Glucose-Capillary: 94 mg/dL (ref 70–99)
Glucose-Capillary: 108 mg/dL — ABNORMAL HIGH (ref 70–99)
Glucose-Capillary: 152 mg/dL — ABNORMAL HIGH (ref 70–99)

## 2024-01-17 LAB — URINE CULTURE: Culture: <10000 — AB

## 2024-01-17 LAB — BASIC METABOLIC PANEL WITH GFR
Anion gap: 9 (ref 5–15)
BUN: 9 mg/dL (ref 8–23)
CO2: 25 mmol/L (ref 22–32)
Calcium: 9 mg/dL (ref 8.9–10.3)
Chloride: 105 mmol/L (ref 98–111)
Creatinine, Ser: 0.8 mg/dL (ref 0.61–1.24)
GFR, Estimated: >60 mL/min (ref >60)
Glucose, Bld: 107 mg/dL — ABNORMAL HIGH (ref 70–99)
Potassium: 4.1 mmol/L (ref 3.5–5.1)
Sodium: 139 mmol/L (ref 135–145)

## 2024-01-17 LAB — CBC
HCT: 37.6 % — ABNORMAL LOW (ref 39.0–52.0)
Hemoglobin: 11.4 g/dL — ABNORMAL LOW (ref 13.0–17.0)
MCH: 28.5 pg (ref 26.0–34.0)
MCHC: 30.3 g/dL (ref 30.0–36.0)
MCV: 94 fL (ref 80.0–100.0)
Platelets: 303 10*3/uL (ref 150–400)
RBC: 4 MIL/uL — ABNORMAL LOW (ref 4.22–5.81)
RDW: 14.4 % (ref 11.5–15.5)
WBC: 17.7 10*3/uL — ABNORMAL HIGH (ref 4.0–10.5)
nRBC: 0 % (ref 0.0–0.2)

## 2024-01-17 LAB — HEMOGLOBIN A1C
Hgb A1c MFr Bld: 5.9 % — ABNORMAL HIGH (ref 4.8–5.6)
Mean Plasma Glucose: 122.63 mg/dL

## 2024-01-17 MED ORDER — CARVEDILOL 6.25 MG PO TABS
6.2500 mg | ORAL_TABLET | Freq: Two times a day (BID) | ORAL | Status: DC
  Administered 2024-01-17 – 2024-01-31 (×28): 6.25 mg via ORAL
  Filled 2024-01-17 (×5): qty 1
  Filled 2024-01-17: qty 2
  Filled 2024-01-17 (×6): qty 1
  Filled 2024-01-17: qty 2
  Filled 2024-01-17 (×15): qty 1

## 2024-01-17 MED ORDER — PANTOPRAZOLE SODIUM 40 MG PO TBEC
40.0000 mg | DELAYED_RELEASE_TABLET | Freq: Every day | ORAL | Status: DC
  Administered 2024-01-17 – 2024-01-31 (×15): 40 mg via ORAL
  Filled 2024-01-17 (×15): qty 1

## 2024-01-17 MED ORDER — ADULT MULTIVITAMIN W/MINERALS CH
1.0000 | ORAL_TABLET | Freq: Every day | ORAL | Status: DC
  Administered 2024-01-17 – 2024-01-31 (×15): 1 via ORAL
  Filled 2024-01-17 (×15): qty 1

## 2024-01-17 MED ORDER — BACLOFEN 10 MG PO TABS
5.0000 mg | ORAL_TABLET | Freq: Three times a day (TID) | ORAL | Status: DC
  Administered 2024-01-17 – 2024-01-31 (×43): 5 mg via ORAL
  Filled 2024-01-17 (×43): qty 1

## 2024-01-17 MED ORDER — ASPIRIN 81 MG PO TBEC
81.0000 mg | DELAYED_RELEASE_TABLET | Freq: Every day | ORAL | Status: DC
  Administered 2024-01-17 – 2024-01-31 (×15): 81 mg via ORAL
  Filled 2024-01-17 (×15): qty 1

## 2024-01-17 MED ORDER — TAMSULOSIN HCL 0.4 MG PO CAPS
0.4000 mg | ORAL_CAPSULE | Freq: Every day | ORAL | Status: DC
  Administered 2024-01-17 – 2024-01-31 (×15): 0.4 mg via ORAL
  Filled 2024-01-17 (×15): qty 1

## 2024-01-17 MED ORDER — INSULIN ASPART 100 UNIT/ML IJ SOLN: 0.0000 [IU] | Freq: Three times a day (TID) | INTRAMUSCULAR | Status: DC

## 2024-01-17 MED ORDER — ATORVASTATIN CALCIUM 80 MG PO TABS
80.0000 mg | ORAL_TABLET | Freq: Every day | ORAL | Status: DC
  Administered 2024-01-17 – 2024-01-30 (×14): 80 mg via ORAL
  Filled 2024-01-17 (×14): qty 1

## 2024-01-17 MED ORDER — DULOXETINE HCL 30 MG PO CPEP
30.0000 mg | ORAL_CAPSULE | Freq: Every day | ORAL | Status: DC
  Administered 2024-01-17 – 2024-01-31 (×15): 30 mg via ORAL
  Filled 2024-01-17 (×15): qty 1

## 2024-01-17 MED ORDER — PROSOURCE PLUS PO LIQD
30.0000 mL | Freq: Two times a day (BID) | ORAL | Status: DC
  Administered 2024-01-17 – 2024-01-31 (×27): 30 mL via ORAL
  Filled 2024-01-17 (×27): qty 30

## 2024-01-17 MED ORDER — MELATONIN 3 MG PO TABS
6.0000 mg | ORAL_TABLET | Freq: Every day | ORAL | Status: DC
  Administered 2024-01-17 – 2024-01-30 (×14): 6 mg via ORAL
  Filled 2024-01-17 (×14): qty 2

## 2024-01-17 MED ORDER — PAROXETINE HCL 20 MG PO TABS
40.0000 mg | ORAL_TABLET | Freq: Every day | ORAL | Status: DC
  Administered 2024-01-17 – 2024-01-31 (×15): 40 mg via ORAL
  Filled 2024-01-17 (×15): qty 2

## 2024-01-17 MED ORDER — NUTRITIONAL SUPPLEMENT PO LIQD: 120.0000 mL | Freq: Two times a day (BID) | ORAL | Status: DC

## 2024-01-17 MED ORDER — LACTATED RINGERS IV SOLN: INTRAVENOUS | Status: AC

## 2024-01-17 NOTE — TOC Initial Note (Signed)
 Transition of Care (TOC) - Initial/Assessment Note    Patient Details  Name: Jacob Mcgrath. MRN: [id number] Date of Birth: [birth year]  Transition of Care [facility]) Jacob Mcgrath:    Jacob Mcgrath Phone Number: 01/17/2024, 10:25 AM  Clinical Narrative:                   Patient is at risk for readmission. Patient was admitted for [facility]. Writer attempted to reach out to [facility]gr[facility] but no response . Patient is a long-term resident at [facility] and plans are to return back . CSW called [facility] and spoke with Jacob Mcgr[facility]. [facility] that patient has been with them for almost a year and the staff assist with dressing, bathing, and transferring into the WC. TOC to follow.   Expected Discharge Plan: Skilled Nursing Facility Barriers to Discharge: Continued Medical Work up   Patient Goals and CMS Choice Patient states their goals for this hospitalization and ongoing recovery are:: return back to SNF CMS Medicare.gov Compare Post Acute Care list provided to:: Other (Comment Required) [facility] facility)        Expected Discharge Plan and Services     Post Acute Care Choice: Skilled Nursing Facility Living arrangements for the past 2 months: Skilled Nursing Facility                                      Prior Living Arrangements/Services Living arrangements for the past 2 months: Skilled Nursing Facility Lives with:: Facility Resident Patient language and need for interpreter reviewed:: Yes Do you feel safe going back to the place where you live?: Yes      Need for Family Participation in Patient Care: No (Comment) (Has a [facility]) Care giver support system in place?: Yes (comment) Current home services: DME Criminal Activity/Legal Involvement Pertinent to Current Situation/Hospitalization: No - Comment as needed  Activities of Daily Living   ADL Screening (condition at time of admission) Independently performs ADLs?: No Does the patient have a NEW difficulty with  bathing/dressing/toileting/self-feeding that is expected to last >3 days?: Yes (Initiates electronic notice to provider for possible OT consult) Does the patient have a NEW difficulty with getting in/out of bed, walking, or climbing stairs that is expected to last >3 days?: Yes (Initiates electronic notice to provider for possible PT consult) Does the patient have a NEW difficulty with communication that is expected to last >3 days?: No Is the patient deaf or have difficulty hearing?: No Does the patient have difficulty seeing, even when wearing glasses/contacts?: No Does the patient have difficulty concentrating, remembering, or making decisions?: Yes  Permission Sought/Granted      Share Information with NAME: Jacob Mcgrath     Permission granted to share info w Relationship: Admission Director at [facility]     Emotional Assessment Appearance:: Appears stated age     Orientation: : Oriented to Self Alcohol / Substance Use: Tobacco Use Psych Involvement: No (comment)  Admission diagnosis:  SIRS (systemic inflammatory response syndrome) (HCC) [R65.10] Cellulitis of left foot [L03.116] Sepsis, due to unspecified organism, unspecified whether acute organ dysfunction present [facility]) [A41.9] Patient Active Problem List   Diagnosis Date Noted   SIRS (systemic inflammatory response syndrome) (HCC) 01/16/2024   Decubitus ulcers 01/16/2024   PAD (peripheral artery disease) (HCC) 01/16/2024   Malnutrition of moderate degree 12/10/2023   Hypomagnesemia 08/02/2023   Community acquired pneumonia of right lower lobe of lung 08/02/2023  Dementia without behavioral disturbance (HCC) 08/02/2023   Acute metabolic encephalopathy 08/02/2023   Essential hypertension 08/02/2023   Status epilepticus (HCC) 07/29/2023   Seizure (HCC) 07/02/2023   Stroke (HCC) 07/01/2023   Leukocytosis 07/01/2023    Class: Acute   Tremor 02/27/2023   GERD (gastroesophageal reflux disease) 02/27/2023   Depression 02/27/2023    Protein-calorie malnutrition, severe 10/07/2022   Ischemic leg 10/01/2022   Hypertension 10/01/2022   Controlled type 2 diabetes mellitus without complication, without long-term current use of insulin  (HCC) 10/01/2022   PCP:  [name], MD Pharmacy:   [facility] - [place], [place] - [address] [place] FOR[place] [address] Phone: [phone] Fax: [phone]     Social Drivers of Health (SDOH) Social History: SDOH Screenings   Food Insecurity: Patient Unable To Answer (01/16/2024)  Housing: Patient Unable To Answer (01/16/2024)  Transportation Needs: Patient Unable To Answer (01/16/2024)  Utilities: Patient Unable To Answer (01/16/2024)  Social Connections: Patient Unable To Answer (01/16/2024)  Tobacco Use: High Risk (01/16/2024)   SDOH Interventions:     Readmission Risk Interventions    01/17/2024   10:20 AM 08/01/2023    3:31 PM 03/01/2023    2:40 PM  Readmission Risk Prevention Plan  Transportation Screening Complete Complete Complete  PCP or Specialist Appt within 5-7 Days  Complete Not Complete  Home Care Screening  Complete Complete  Medication Review (RN CM)  Complete Complete  HRI or Home Care Consult Complete    Social Work Consult for Recovery Care Planning/Counseling Complete    Palliative Care Screening Not Applicable    Medication Review Oceanographer) Complete

## 2024-01-17 NOTE — Consult Note (Signed)
 [facility] Consult  Reason for Consult: Left foot wounds Referring Physician: Dr. Pearlean  Chief Complaint   Fever; Tremors     HPI: Jacob Mcgrath. is a 68 y.o. male who presented to the hospital from his nursing home secondary to fevers, and tremors.  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  Patient is confused secondary to his dementia, and unable to answer questions appropriately on examination.  Through EMR review, he follows with vascular surgery in [facility], and was last seen by Dr. Hadassah in October 2024, at which time previous left lower extremity wounds had healed after stent placement in March 2024.  He is also previously followed with podiatry, who has removed eschars from his left foot.  In the ED, he was noted to be hypotensive and tachycardic.  This improved with fluid resuscitation.  He was noted to have a leukocytosis of 15.1.  He underwent a left foot x-ray which demonstrated no evidence of osteomyelitis.  He subsequently underwent a CT angio aortobifem with and without contrast which demonstrated an occluded left SFA stent extending to the proximal popliteal with reconstitution of a diminutive native left popliteal above the knee, occluded left PT artery to the mid calf and a patent left external iliac stent.  He also had findings of a short segment high-grade stenosis in the right external iliac.  He was admitted to the hospital secondary to his SIRS response and surgery was consulted for evaluation for possible debridement of these wounds.  Past Medical History:  Diagnosis Date   Dementia (HCC)    Diabetes mellitus without complication (HCC)    Hypertension    Stroke [facility])     Past Surgical History:  Procedure Laterality Date   ABDOMINAL AORTOGRAM W/LOWER EXTREMITY N/A 10/04/2022   Procedure: ABDOMINAL AORTOGRAM W/LOWER  EXTREMITY;  Surgeon: Eliza Lonni RAMAN, MD;  Location: [facility] INVASIVE CV LAB;  Service: Cardiovascular;  Laterality: N/A;   ABDOMINAL AORTOGRAM W/LOWER EXTREMITY N/A 10/08/2022   Procedure: ABDOMINAL AORTOGRAM W/LOWER EXTREMITY;  Surgeon: Magda Debby SAILOR, MD;  Location: [facility] INVASIVE CV LAB;  Service: Cardiovascular;  Laterality: N/A;   PERIPHERAL VASCULAR INTERVENTION  10/04/2022   Procedure: PERIPHERAL VASCULAR INTERVENTION;  Surgeon: Eliza Lonni RAMAN, MD;  Location: [facility] INVASIVE CV LAB;  Service: Cardiovascular;;   PERIPHERAL VASCULAR INTERVENTION Left 10/08/2022   Procedure: PERIPHERAL VASCULAR INTERVENTION;  Surgeon: Magda Debby SAILOR, MD;  Location: [facility] INVASIVE CV LAB;  Service: Cardiovascular;  Laterality: Left;   TONSILLECTOMY      History reviewed. No pertinent family history.  Social History   Tobacco Use   Smoking status: Every Day    Current packs/day: 1.00    Types: Cigarettes  Vaping Use   Vaping status: Never Used  Substance Use Topics   Alcohol use: Not Currently   Drug use: Not Currently    Medications: I have reviewed the patient's current medications.  Allergies  Allergen Reactions   Amoxicillin Other (See Comments)    Unknown reaction per facility   Ampicillin Other (See Comments)    Unknown reaction per facility   Tetracyclines & Related Rash     ROS:  Unable to obtain secondary to patient dementia  Blood pressure 131/62, pulse (!) 118, temperature 98.5 F (36.9 C), temperature source Axillary, resp. rate 17, height 5' 9 (1.753 m), weight 65.6 kg, SpO2 100%. Physical Exam Vitals reviewed.  Constitutional:  General: He is not in acute distress.    Appearance: He is not toxic-appearing.  HENT:     Head: Normocephalic and atraumatic.   Eyes:     Extraocular Movements: Extraocular movements intact.     Pupils: Pupils are equal, round, and reactive to light.    Cardiovascular:     Rate and Rhythm: Tachycardia present.     Pulses:           Dorsalis pedis pulses are detected w/ Doppler on the right side and detected w/ Doppler on the left side.       Posterior tibial pulses are 0 on the left side.  Pulmonary:     Effort: Pulmonary effort is normal.  Abdominal:     General: There is no distension.     Palpations: Abdomen is soft.     Tenderness: There is no abdominal tenderness.   Musculoskeletal:     Cervical back: Normal range of motion.     Comments: Contracture of left lower extremity with decreased range of motion  Feet:     Comments: No palpable left foot pulses, very weak DP signal on Doppler.  Left foot with multiple wounds and overlying eschars with mild erythema- left heel wound measuring 7 x 3 cm with overlying eschar, no areas of fluctuance or purulent drainage; left lateral foot wounds measuring 4 x 2 cm, 4 x 2 cm, and 2-1/2 x 2-1/2 cm with overlying eschars, distalmost 4 x 2 cm wound without overlying eschar, no fluctuance or purulent drainage  Neurological:     Mental Status: He is alert. Mental status is at baseline.        Results: Results for orders placed or performed during the hospital encounter of 01/16/24 (from the past 48 hours)  Lactic acid, plasma     Status: None   Collection Time: 01/16/24 12:44 PM  Result Value Ref Range   Lactic Acid, Venous 1.3 0.5 - 1.9 mmol/L    Comment: Performed at [facility] [address], [place], [place] [address]  Comprehensive metabolic panel     Status: Abnormal   Collection Time: 01/16/24 12:44 PM  Result Value Ref Range   Sodium 139 135 - 145 mmol/L   Potassium 4.2 3.5 - 5.1 mmol/L   Chloride 105 98 - 111 mmol/L   CO2 25 22 - 32 mmol/L   Glucose, Bld 123 (H) 70 - 99 mg/dL    Comment: Glucose reference range applies only to samples taken after fasting for at least 8 hours.   BUN 12 8 - 23 mg/dL   Creatinine, Ser 9.15 0.61 - 1.24 mg/dL   Calcium  8.6 (L) 8.9 - 10.3 mg/dL   Total Protein 7.4 6.5 - 8.1 g/dL   Albumin 2.6 (L) 3.5 - 5.0 g/dL   AST 31 15  - 41 U/L   ALT 17 0 - 44 U/L   Alkaline Phosphatase 61 38 - 126 U/L   Total Bilirubin 0.6 0.0 - 1.2 mg/dL   GFR, Estimated >39 >39 mL/min    Comment: (NOTE) Calculated using the CKD-EPI Creatinine Equation (2021)    Anion gap 9 5 - 15    Comment: Performed at [facility], [address] [place], [place] [address]  CBC with Differential     Status: Abnormal   Collection Time: 01/16/24 12:44 PM  Result Value Ref Range   WBC 15.1 (H) 4.0 - 10.5 K/uL   RBC 3.31 (L) 4.22 - 5.81 MIL/uL   Hemoglobin 9.6 (  L) 13.0 - 17.0 g/dL   HCT 69.9 (L) 60.9 - 47.9 %   MCV 90.6 80.0 - 100.0 fL   MCH 29.0 26.0 - 34.0 pg   MCHC 32.0 30.0 - 36.0 g/dL   RDW 85.5 88.4 - 84.4 %   Platelets 284 150 - 400 K/uL   nRBC 0.0 0.0 - 0.2 %   Neutrophils Relative % 62 %   Neutro Abs 9.5 (H) 1.7 - 7.7 K/uL   Lymphocytes Relative 19 %   Lymphs Abs 2.8 0.7 - 4.0 K/uL   Monocytes Relative 14 %   Monocytes Absolute 2.1 (H) 0.1 - 1.0 K/uL   Eosinophils Relative 4 %   Eosinophils Absolute 0.5 0.0 - 0.5 K/uL   Basophils Relative 0 %   Basophils Absolute 0.0 0.0 - 0.1 K/uL   Immature Granulocytes 1 %   Abs Immature Granulocytes 0.07 0.00 - 0.07 K/uL    Comment: Performed at [facility], [address] [place], [place] [address]  Protime-INR     Status: None   Collection Time: 01/16/24 12:44 PM  Result Value Ref Range   Prothrombin Time 14.6 11.4 - 15.2 seconds   INR 1.1 0.8 - 1.2    Comment: (NOTE) INR goal varies based on device and disease states. Performed at [facility], [address], [place], [place] [address]   Blood Culture (routine x 2)     Status: None (Preliminary result)   Collection Time: 01/16/24 12:44 PM   Specimen: BLOOD  Result Value Ref Range   Specimen Description BLOOD BLOOD RIGHT ARM    Special Requests      BOTTLES DRAWN AEROBIC AND ANAEROBIC Blood Culture results may not be optimal due to an inadequate volume of blood received in culture bottles   Culture      NO GROWTH < 24  HOURS Performed at [facility], [address], [place], [place] [address]    Report Status PENDING   Hemoglobin A1c     Status: Abnormal   Collection Time: 01/16/24 12:44 PM  Result Value Ref Range   Hgb A1c MFr Bld 5.9 (H) 4.8 - 5.6 %    Comment: (NOTE) Diagnosis of Diabetes The following HbA1c ranges recommended by the American Diabetes Association (ADA) may be used as an aid in the diagnosis of diabetes mellitus.  Hemoglobin             Suggested A1C NGSP%              Diagnosis  <5.7                   Non Diabetic  5.7-6.4                Pre-Diabetic  >6.4                   Diabetic  <7.0                   Glycemic control for                       adults with diabetes.     Mean Plasma Glucose 122.63 mg/dL    Comment: Performed at [facility], [address], [place], [place] [address]  Blood Culture (routine x 2)     Status: None (Preliminary result)   Collection Time: 01/16/24 12:55 PM   Specimen: BLOOD  Result Value Ref Range   Specimen Description  BLOOD LEFT ASSIST CONTROL    Special Requests      BOTTLES DRAWN AEROBIC AND ANAEROBIC Blood Culture adequate volume   Culture      NO GROWTH < 24 HOURS Performed at [facility], [address], [place], [place] [address]    Report Status PENDING   Resp panel by RT-PCR (RSV, Flu A&B, Covid) Anterior Nasal Swab     Status: None   Collection Time: 01/16/24  1:07 PM   Specimen: Anterior Nasal Swab  Result Value Ref Range   SARS Coronavirus 2 by RT PCR NEGATIVE NEGATIVE    Comment: (NOTE) SARS-CoV-2 target nucleic acids are NOT DETECTED.  The SARS-CoV-2 RNA is generally detectable in upper respiratory specimens during the acute phase of infection. The lowest concentration of SARS-CoV-2 viral copies this assay can detect is 138 copies/mL. A negative result does not preclude SARS-Cov-2 infection and should not be used as the sole basis for treatment or other patient management decisions. A negative result may  occur with  improper specimen collection/handling, submission of specimen other than nasopharyngeal swab, presence of viral mutation(s) within the areas targeted by this assay, and inadequate number of viral copies(<138 copies/mL). A negative result must be combined with clinical observations, patient history, and epidemiological information. The expected result is Negative.  Fact Sheet for Patients:  BloggerCourse.com  Fact Sheet for Healthcare Providers:  SeriousBroker.it  This test is no t yet approved or cleared by the [place]  FDA and  has been authorized for detection and/or diagnosis of SARS-CoV-2 by FDA under an Emergency Use Authorization (EUA). This EUA will remain  in effect (meaning this test can be used) for the duration of the COVID-19 declaration under Section 564(b)(1) of the Act, 21 U.S.C.section 360bbb-3(b)(1), unless the authorization is terminated  or revoked sooner.       Influenza A by PCR NEGATIVE NEGATIVE   Influenza B by PCR NEGATIVE NEGATIVE    Comment: (NOTE) The Xpert Xpress SARS-CoV-2/FLU/RSV plus assay is intended as an aid in the diagnosis of influenza from Nasopharyngeal swab specimens and should not be used as a sole basis for treatment. Nasal washings and aspirates are unacceptable for Xpert Xpress SARS-CoV-2/FLU/RSV testing.  Fact Sheet for Patients: BloggerCourse.com  Fact Sheet for Healthcare Providers: SeriousBroker.it  This test is not yet approved or cleared by the [place]  FDA and has been authorized for detection and/or diagnosis of SARS-CoV-2 by FDA under an Emergency Use Authorization (EUA). This EUA will remain in effect (meaning this test can be used) for the duration of the COVID-19 declaration under Section 564(b)(1) of the Act, 21 U.S.C. section 360bbb-3(b)(1), unless the authorization is terminated or revoked.      Resp Syncytial Virus by PCR NEGATIVE NEGATIVE    Comment: (NOTE) Fact Sheet for Patients: BloggerCourse.com  Fact Sheet for Healthcare Providers: SeriousBroker.it  This test is not yet approved or cleared by the [place]  FDA and has been authorized for detection and/or diagnosis of SARS-CoV-2 by FDA under an Emergency Use Authorization (EUA). This EUA will remain in effect (meaning this test can be used) for the duration of the COVID-19 declaration under Section 564(b)(1) of the Act, 21 U.S.C. section 360bbb-3(b)(1), unless the authorization is terminated or revoked.  Performed at [facility], [address], [place], [place] [address]   Urinalysis, w/ Reflex to Culture (Infection Suspected) -Urine, Catheterized     Status: Abnormal   Collection Time: 01/16/24  1:42 PM  Result Value Ref Range   Specimen  Source URINE, CATHETERIZED    Color, Urine YELLOW YELLOW   APPearance CLEAR CLEAR   Specific Gravity, Urine 1.014 1.005 - 1.030   pH 6.0 5.0 - 8.0   Glucose, UA NEGATIVE NEGATIVE mg/dL   Hgb urine dipstick NEGATIVE NEGATIVE   Bilirubin Urine NEGATIVE NEGATIVE   Ketones, ur NEGATIVE NEGATIVE mg/dL   Protein, ur NEGATIVE NEGATIVE mg/dL   Nitrite NEGATIVE NEGATIVE   Leukocytes,Ua TRACE (A) NEGATIVE   RBC / HPF 0-5 0 - 5 RBC/hpf   WBC, UA 11-20 0 - 5 WBC/hpf    Comment:        Reflex urine culture not performed if WBC <=10, OR if Squamous epithelial cells >5. If Squamous epithelial cells >5 suggest recollection.    Bacteria, UA RARE (A) NONE SEEN   Squamous Epithelial / HPF 0-5 0 - 5 /HPF    Comment: Performed at [facility], [address] [place], [place] [address]  Urine Culture     Status: Abnormal   Collection Time: 01/16/24  1:42 PM   Specimen: Urine, Random  Result Value Ref Range   Specimen Description      URINE, RANDOM Performed at [facility], [address], [place], [place] [address]    Special Requests       NONE Reflexed from [id number] Performed at [facility], [address], [place], [place] [address]    Culture (A)     <10,000 COLONIES/mL INSIGNIFICANT GROWTH Performed at [facility], [address], [place], [place] [address]    Report Status 01/17/2024 FINAL   MRSA Next Gen by PCR, Nasal     Status: Abnormal   Collection Time: 01/16/24  7:40 PM   Specimen: Nasal Mucosa; Nasal Swab  Result Value Ref Range   MRSA by PCR Next Gen DETECTED (A) NOT DETECTED    Comment: RESULT CALLED TO, READ BACK BY AND VERIFIED WITH: VELMA LINGER, RN AT 2136 01/16/24 BY A. SNYDER (NOTE) The GeneXpert MRSA Assay (FDA approved for NASAL specimens only), is one component of a comprehensive MRSA colonization surveillance program. It is not intended to diagnose MRSA infection nor to guide or monitor treatment for MRSA infections. Test performance is not FDA approved in patients less than 69 years old. Performed at [facility], [address] [place], [place] [address]   Basic metabolic panel     Status: Abnormal   Collection Time: 01/17/24  4:16 AM  Result Value Ref Range   Sodium 139 135 - 145 mmol/L   Potassium 4.1 3.5 - 5.1 mmol/L   Chloride 105 98 - 111 mmol/L   CO2 25 22 - 32 mmol/L   Glucose, Bld 107 (H) 70 - 99 mg/dL    Comment: Glucose reference range applies only to samples taken after fasting for at least 8 hours.   BUN 9 8 - 23 mg/dL   Creatinine, Ser 9.19 0.61 - 1.24 mg/dL   Calcium  9.0 8.9 - 10.3 mg/dL   GFR, Estimated >39 >39 mL/min    Comment: (NOTE) Calculated using the CKD-EPI Creatinine Equation (2021)    Anion gap 9 5 - 15    Comment: Performed at [facility], [address], [place], [place] [address]  CBC     Status: Abnormal   Collection Time: 01/17/24  4:16 AM  Result Value Ref Range   WBC 17.7 (H) 4.0 - 10.5 K/uL   RBC 4.00 (L) 4.22 - 5.81 MIL/uL   Hemoglobin 11.4 (L) 13.0 - 17.0 g/dL  HCT 37.6 (L) 39.0 - 52.0 %   MCV 94.0 80.0 - 100.0 fL   MCH 28.5 26.0 -  34.0 pg   MCHC 30.3 30.0 - 36.0 g/dL   RDW 85.5 88.4 - 84.4 %   Platelets 303 150 - 400 K/uL   nRBC 0.0 0.0 - 0.2 %    Comment: Performed at [facility], [address] [place], [place] [address]  Glucose, capillary     Status: Abnormal   Collection Time: 01/17/24  6:05 AM  Result Value Ref Range   Glucose-Capillary 57 (L) 70 - 99 mg/dL    Comment: Glucose reference range applies only to samples taken after fasting for at least 8 hours.  Glucose, capillary     Status: Abnormal   Collection Time: 01/17/24  7:37 AM  Result Value Ref Range   Glucose-Capillary 104 (H) 70 - 99 mg/dL    Comment: Glucose reference range applies only to samples taken after fasting for at least 8 hours.  Glucose, capillary     Status: None   Collection Time: 01/17/24 11:42 AM  Result Value Ref Range   Glucose-Capillary 95 70 - 99 mg/dL    Comment: Glucose reference range applies only to samples taken after fasting for at least 8 hours.    CT Angio Aortobifemoral W and/or Wo Contrast Result Date: 01/16/2024 CLINICAL DATA:  Left lower extremity ischemia, fever, tremors, dementia EXAM: CT ANGIOGRAPHY OF ABDOMINAL AORTA WITH ILIOFEMORAL RUNOFF TECHNIQUE: Multidetector CT imaging of the abdomen, pelvis and lower extremities was performed using the standard protocol during bolus administration of intravenous contrast. Multiplanar CT image reconstructions and MIPs were obtained to evaluate the vascular anatomy. RADIATION DOSE REDUCTION: This exam was performed according to the departmental dose-optimization program which includes automated exposure control, adjustment of the mA and/or kV according to patient size and/or use of iterative reconstruction technique. CONTRAST:  OMNIPAQUE  IOHEXOL  350 MG/ML SOLN COMPARISON:  None Available. FINDINGS: VASCULAR Aorta: Moderate calcified plaque. No aneurysm, dissection, or stenosis. Celiac: Partially calcified ostial plaque with only mild stenosis, patent distally with classic  trifurcation anatomy. SMA: Partially calcified ostial plaque over length of at least 2.1 cm resulting in mild stenosis, patent distally with classic branch anatomy. Renals: Single bilaterally, both with calcified ostial plaque resulting in short segment mild stenosis, patent distally. IMA: Patent without evidence of aneurysm, dissection, vasculitis or significant stenosis. RIGHT Lower Extremity Inflow: Common iliac eccentric calcified atheromatous plaque throughout. There is stenosis at its bifurcation extending into the proximal common and internal iliac arteries of at least mild severity. There is a short-segment high-grade stenosis in the mid external iliac artery, which is diffusely atheromatous. Outflow: Common femoral heavily atheromatous, with mild diffuse narrowing. Deep femoral branches patent, atheromatous. SFA calcified plaque proximally with only mild stenosis, patent distally. Popliteal artery scattered calcified plaque above the knee, patent distally. Runoff: Calcified plaque proximally but apparently contiguous distal trifurcation runoff. LEFT Lower Extremity Inflow: Common iliac moderately atheromatous, with stenosis at its bifurcation of at least moderate severity over short segment into the proximal external and internal iliac arteries. Patent stent through the more distal external iliac artery. Outflow: Common femoral moderately atheromatous, patent. Patent stent extends across the SFA origin. There is a distal SFA stent extending into the proximal popliteal artery which is occluded throughout almost its entire length. There is collateral reconstitution of a diminutive native popliteal above the knee, which is patent distally. Runoff: Posterior tibial occludes mid calf. Anterior tibial is patent across the foot  as dorsalis pedis. Peroneal is diminutive, with scattered calcified plaque. Veins: No obvious venous abnormality within the limitations of this arterial phase study. Review of the MIP  images confirms the above findings. NON-VASCULAR Lower chest: No pleural or pericardial effusion. Coronary and aortic calcifications. Coarse interstitial opacities in the dependent aspect of both lung bases. Hepatobiliary: Small hyperdensity in the dependent aspect of the nondilated gallbladder suggesting small calcified gallstone. No focal liver lesion or biliary ductal dilatation. Pancreas: Unremarkable. No pancreatic ductal dilatation or surrounding inflammatory changes. Spleen: Normal in size without focal abnormality. Adrenals/Urinary Tract: No adrenal mass. Kidneys symmetric in size with normal enhancement. No hydronephrosis. 1.4 cm probable cortical cyst, left upper pole; no follow-up origin. Urinary bladder is moderately distended with mild circumferential wall thickening and a few small anterior diverticula. Stomach/Bowel: Stomach is nondistended, unremarkable. Small bowel decompressed. Normal appendix. The colon is partially distended, without acute finding Lymphatic: Left inguinal and external iliac lymph nodes up to a 1.2 cm short axis diameter. No mesenteric or retroperitoneal adenopathy. Reproductive: Mild prostate enlargement with central calcifications. Other: No ascites.  No free air. Musculoskeletal: Left hip arthroplasty .  Advanced right hip DJD. IMPRESSION: 1. Occluded left SFA stent extending into the proximal popliteal artery. 2. Collateral reconstitution of a diminutive native left popliteal above the knee, which is patent distally. 3. Occluded left posterior tibial artery to the mid calf. 4. Patent left external iliac artery stent. 5. Short-segment high-grade stenosis mid right external iliac artery. 6. Cholelithiasis. 7. Mild prostate enlargement with mild bladder wall thickening and diverticula. 8.  Aortic Atherosclerosis (ICD10-I70.0). Electronically Signed   By: JONETTA Faes M.D.   On: 01/16/2024 16:18   DG Chest Port 1 View Result Date: 01/16/2024 CLINICAL DATA:  Questionable sepsis.   Fever and tremors. EXAM: PORTABLE CHEST 1 VIEW COMPARISON:  12/13/2023 and CT chest 10/21/2021. FINDINGS: Trachea is midline. Heart size normal. Thoracic aorta is calcified. Mild linear scarring in the left lower lobe. Lungs are otherwise clear. No pleural fluid. IMPRESSION: No acute findings. Electronically Signed   By: Newell Eke M.D.   On: 01/16/2024 14:29   DG Foot 2 Views Left Result Date: 01/16/2024 CLINICAL DATA:  Sepsis, fever and tremors. EXAM: LEFT FOOT - 2 VIEW COMPARISON:  None Available. FINDINGS: Hallux valgus. Mild degenerative changes of the first metatarsophalangeal joint. Osteopenia. Small soft tissue wound along the plantar aspect of the posterior calcaneus. No underlying osseous erosion to suggest osteomyelitis. IMPRESSION: 1. No evidence of osteomyelitis. 2. 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 3. Osteopenia. Electronically Signed   By: Newell Eke M.D.   On: 01/16/2024 14:29     Assessment & Plan:  Knut Rondinelli. is a 68 y.o. male who was admitted with SIRS and left foot and lower extremity vascular wounds.  Imaging and blood work evaluated by myself.  -Patient has multiple left foot wounds with overlying eschars and superimposed cellulitis, however no evidence of drainable fluid collections on examination or on imaging -CTA is demonstrating occlusion of his left SFA stent down to the proximal popliteal with reconstitution of a diminutive native left popliteal above the knee, and occlusion of the left posterior tibial artery to the mid calf. -Given his known peripheral vascular disease and redemonstration of vascular disease on CTA, patient needs evaluation by vascular surgery.  I suspect that he will require amputation (BKA vs AKA) if blood flow is not able to be improved to his left foot -Since there is no concern for any drainable  fluid collections on imaging or examination and patient is still currently on Plavix , I am not going to  perform any bedside debridements for this patient, especially because it will not heal with his current vascular flow -Recommend local wound care with cushioning of pressure points -Would continue IV antibiotics, as patient likely has a superimposed cellulitis of the left foot related to his wounds -Okay to continue Plavix  -Care per hospitalist.  Likely plan for transfer down to [facility] for vascular evaluation  Note: Portions of this report may have been transcribed using voice recognition software. Every effort has been made to ensure accuracy; however, inadvertent computerized transcription errors may still be present.   -- Dorothyann Brittle, DO [facility] [address] [place], [place] [address] [phone] (office)

## 2024-01-17 NOTE — Progress Notes (Signed)
 PROGRESS NOTE   Jacob Mcgrath.  FMW:[id number] DOB: 1955/08/26 DOA: 01/16/2024 PCP: [name], MD   Chief Complaint  Patient presents with   Fever   Tremors   Level of care: Stepdown  Brief Admission History:  68 y.o. male with medical history significant for dementia, type 2 diabetes mellitus, hypertension, PAD s/p stenting, epilepsy with history of status epilepticus, depression, history of stroke.  Patient was brought to the ED from [facility] long term care with report of fevers, tremors that started yesterday.  He apparently had been on a 14 day course of ertapenem at SNF for diabetic foot infection/ulcer.  Facility reports fever of 103 this morning, he was given Tylenol  prior to arrival.  They note necrotic appearing areas on feet.    Recently hospitalized 5/15 to 5/23-for status epilepticus, requiring intubation for seizure control, he was discharged on Depakote and Keppra .   ED Course: Temperature 98.8.  Heart rate 101-147.  O2 sats greater than 94% on room air. WBC 15.1.  Lactic acid 1.3. Chest x-ray clear. Left foot x-ray-no evidence of osteomyelitis. CT angio + bifemoral-proximal popliteal artery, collateral reconstitution of diminutive native left popliteal above the knee which is patent distally, occluded left posterior tibial artery to the mid calf.  Left external iliac artery stent short segment high-grade stenosis mid right external iliac artery.  IV vancomycin  and cefepime  started.  2 L bolus given.  He was admitted to [facility] and general surgery consulted and evaluated and recommendation was for vascular surgery consultation.     Assessment and Plan:  Sepsis from cellulitis  Presenting with tachycardia heart rate 101-123, leukocytosis of 15.1, tachypnea respiratory 20-22.  Normal lactic acid 1.3.  Nursing home reports fever- 103, he was given Tylenol  prior to arrival.  He has chronic left left lower extremity wounds which may be etiology of infection.   Left foot x-ray-no evidence of osteomyelitis.  UA with trace leukocytes not quite convincing as etiology. Abdomen is benign.  COVID-negative. -IV fluids ordered - Continue broad-spectrum antibiotics IV vancomycin  cefepime  and metronidazole  - Follow-up blood and urine cultures  PAD (peripheral artery disease) - severe  At this time no suggestion of ischemia. Follows with vascular surgeon Dr. Norita visit 04/2023.  - CT angio bifemoral- Occluded left SFA stent extending into the proximal popliteal artery. Collateral reconstitution of a diminutive native left popliteal above the knee, which is patent distally. Occluded left posterior tibial artery to the mid calf. Patent left external iliac artery stent. Short-segment high-grade stenosis mid right external iliac artery. - Resume Plavix  - discussed with Dr. Evonnie, pt needs  inpatient vascular surgery consultation, high risk for amputation; called out to vascular surgeon on call requested consultation at [facility] and will transfer patient to [facility].    Decubitus ulcers Left lower extremity decubitus ulcers-on lateral side.  Unstageable.  Black eschar present.  Possible cellulitis, but due to hyperpigmentation hard to tell, but no fluctuance or swelling appreciated.  Left foot x-ray-acute of acute abnormality. -Broad-spectrum antibiotics for now -General Surgery consulted - see consult note - Per MAR-he had been on a 14 day course of  IV Ertepenem for diabetic foot ulcer  Essential hypertension Blood pressure soft but improving after hydration  -resume prior to admission carvedilol  6.25 mg twice daily with hold parameters  Acute metabolic encephalopathy now more awake. Baseline dementia.  Unable to determine severity of patient's baseline dementia.    -IV antibiotics, IV fluids  Dementia without behavioral disturbance  Delirium precautions He needs  goals of care  Epilepsy Resumed oral Keppra , with Percocet as IV medication, pending  improvement in mental status.  Controlled type 2 diabetes mellitus without complication, without long-term current use of insulin  (HCC) Hypoglycemia   - HgbA1c - 5.9%  - DC SSI coverage - he doesn't need it - try to avoid hypoglycemia  CBG (last 3)  Recent Labs    01/17/24 0605 01/17/24 0737 01/17/24 1142  GLUCAP 57* 104* 95   DVT prophylaxis: enoxaparin  Code Status: full  Family Communication: could not get in touch with anyone on 6/24 Disposition: longterm care [facility]    Consultants:  General surgery Vascular surgery  Procedures:   Antimicrobials:    Subjective: Pt has advanced dementia and unable to verbalize needs Objective: Vitals:   01/17/24 0700 01/17/24 0739 01/17/24 0900 01/17/24 1000  BP: (!) 183/85  94/62 131/62  Pulse:      Resp: (!) 22  (!) 24 17  Temp:  98.5 F (36.9 C)    TempSrc:  Axillary    SpO2:  100%  100%  Weight:      Height:        Intake/Output Summary (Last 24 hours) at 01/17/2024 1341 Last data filed at 01/17/2024 1156 Gross per 24 hour  Intake 4452.79 ml  Output 200 ml  Net 4252.79 ml   Filed Weights   01/16/24 1223 01/16/24 2000  Weight: 62.3 kg 65.6 kg   Examination:  General exam: frail, emaciated, chronically ill, appears much older than age.   Respiratory system: Clear to auscultation. Respiratory effort normal. Cardiovascular system: normal S1 & S2 heard. No JVD, murmurs, rubs, gallops or clicks. No pedal edema. Gastrointestinal system: Abdomen is nondistended, soft and nontender. No organomegaly or masses felt. Normal bowel sounds heard. Central nervous system: Alert but severely disoriented.  Extremities:  left hemiparesis Skin: necrotic appearing skin on feet, sacrum     Psychiatry: Judgement and insight severely diminished.   Data Reviewed: I have personally reviewed following labs and imaging studies  CBC: Recent Labs  Lab 01/16/24 1244 01/17/24 0416  WBC 15.1* 17.7*  NEUTROABS 9.5*  --   HGB  9.6* 11.4*  HCT 30.0* 37.6*  MCV 90.6 94.0  PLT 284 303    Basic Metabolic Panel: Recent Labs  Lab 01/16/24 1244 01/17/24 0416  NA 139 139  K 4.2 4.1  CL 105 105  CO2 25 25  GLUCOSE 123* 107*  BUN 12 9  CREATININE 0.84 0.80  CALCIUM  8.6* 9.0    CBG: Recent Labs  Lab 01/17/24 0605 01/17/24 0737 01/17/24 1142  GLUCAP 57* 104* 95    Recent Results (from the past 240 hours)  Blood Culture (routine x 2)     Status: None (Preliminary result)   Collection Time: 01/16/24 12:44 PM   Specimen: BLOOD  Result Value Ref Range Status   Specimen Description BLOOD BLOOD RIGHT ARM  Final   Special Requests   Final    BOTTLES DRAWN AEROBIC AND ANAEROBIC Blood Culture results may not be optimal due to an inadequate volume of blood received in culture bottles   Culture   Final    NO GROWTH < 24 HOURS Performed at [facility], [address] [place], [place] [address]    Report Status PENDING  Incomplete  Blood Culture (routine x 2)     Status: None (Preliminary result)   Collection Time: 01/16/24 12:55 PM   Specimen: BLOOD  Result Value Ref Range Status   Specimen Description BLOOD  LEFT ASSIST CONTROL  Final   Special Requests   Final    BOTTLES DRAWN AEROBIC AND ANAEROBIC Blood Culture adequate volume   Culture   Final    NO GROWTH < 24 HOURS Performed at [facility], [address] [place], [place] [address]    Report Status PENDING  Incomplete  Resp panel by RT-PCR (RSV, Flu A&B, Covid) Anterior Nasal Swab     Status: None   Collection Time: 01/16/24  1:07 PM   Specimen: Anterior Nasal Swab  Result Value Ref Range Status   SARS Coronavirus 2 by RT PCR NEGATIVE NEGATIVE Final    Comment: (NOTE) SARS-CoV-2 target nucleic acids are NOT DETECTED.  The SARS-CoV-2 RNA is generally detectable in upper respiratory specimens during the acute phase of infection. The lowest concentration of SARS-CoV-2 viral copies this assay can detect is 138 copies/mL. A negative result does  not preclude SARS-Cov-2 infection and should not be used as the sole basis for treatment or other patient management decisions. A negative result may occur with  improper specimen collection/handling, submission of specimen other than nasopharyngeal swab, presence of viral mutation(s) within the areas targeted by this assay, and inadequate number of viral copies(<138 copies/mL). A negative result must be combined with clinical observations, patient history, and epidemiological information. The expected result is Negative.  Fact Sheet for Patients:  BloggerCourse.com  Fact Sheet for Healthcare Providers:  SeriousBroker.it  This test is no t yet approved or cleared by the [place]  FDA and  has been authorized for detection and/or diagnosis of SARS-CoV-2 by FDA under an Emergency Use Authorization (EUA). This EUA will remain  in effect (meaning this test can be used) for the duration of the COVID-19 declaration under Section 564(b)(1) of the Act, 21 U.S.C.section 360bbb-3(b)(1), unless the authorization is terminated  or revoked sooner.       Influenza A by PCR NEGATIVE NEGATIVE Final   Influenza B by PCR NEGATIVE NEGATIVE Final    Comment: (NOTE) The Xpert Xpress SARS-CoV-2/FLU/RSV plus assay is intended as an aid in the diagnosis of influenza from Nasopharyngeal swab specimens and should not be used as a sole basis for treatment. Nasal washings and aspirates are unacceptable for Xpert Xpress SARS-CoV-2/FLU/RSV testing.  Fact Sheet for Patients: BloggerCourse.com  Fact Sheet for Healthcare Providers: SeriousBroker.it  This test is not yet approved or cleared by the [place]  FDA and has been authorized for detection and/or diagnosis of SARS-CoV-2 by FDA under an Emergency Use Authorization (EUA). This EUA will remain in effect (meaning this test can be used) for the  duration of the COVID-19 declaration under Section 564(b)(1) of the Act, 21 U.S.C. section 360bbb-3(b)(1), unless the authorization is terminated or revoked.     Resp Syncytial Virus by PCR NEGATIVE NEGATIVE Final    Comment: (NOTE) Fact Sheet for Patients: BloggerCourse.com  Fact Sheet for Healthcare Providers: SeriousBroker.it  This test is not yet approved or cleared by the [place]  FDA and has been authorized for detection and/or diagnosis of SARS-CoV-2 by FDA under an Emergency Use Authorization (EUA). This EUA will remain in effect (meaning this test can be used) for the duration of the COVID-19 declaration under Section 564(b)(1) of the Act, 21 U.S.C. section 360bbb-3(b)(1), unless the authorization is terminated or revoked.  Performed at [facility], [address] [place], [place] [address]   Urine Culture     Status: Abnormal   Collection Time: 01/16/24  1:42 PM   Specimen: Urine, Random  Result  Value Ref Range Status   Specimen Description   Final    URINE, RANDOM Performed at [facility], [address] [place], [place] [address]    Special Requests   Final    NONE Reflexed from [id number] Performed at [facility], [address] [place], [place] [address]    Culture (A)  Final    <10,000 COLONIES/mL INSIGNIFICANT GROWTH Performed at [facility], [address], [place], [place] [address]    Report Status 01/17/2024 FINAL  Final  MRSA Next Gen by PCR, Nasal     Status: Abnormal   Collection Time: 01/16/24  7:40 PM   Specimen: Nasal Mucosa; Nasal Swab  Result Value Ref Range Status   MRSA by PCR Next Gen DETECTED (A) NOT DETECTED Final    Comment: RESULT CALLED TO, READ BACK BY AND VERIFIED WITH: VELMA LINGER, RN AT 2136 01/16/24 BY A. SNYDER (NOTE) The GeneXpert MRSA Assay (FDA approved for NASAL specimens only), is one component of a comprehensive MRSA colonization surveillance program. It is  not intended to diagnose MRSA infection nor to guide or monitor treatment for MRSA infections. Test performance is not FDA approved in patients less than 23 years old. Performed at [facility], [address], [place], [place] [address]      Radiology Studies: CT Angio Aortobifemoral W and/or Wo Contrast Result Date: 01/16/2024 CLINICAL DATA:  Left lower extremity ischemia, fever, tremors, dementia EXAM: CT ANGIOGRAPHY OF ABDOMINAL AORTA WITH ILIOFEMORAL RUNOFF TECHNIQUE: Multidetector CT imaging of the abdomen, pelvis and lower extremities was performed using the standard protocol during bolus administration of intravenous contrast. Multiplanar CT image reconstructions and MIPs were obtained to evaluate the vascular anatomy. RADIATION DOSE REDUCTION: This exam was performed according to the departmental dose-optimization program which includes automated exposure control, adjustment of the mA and/or kV according to patient size and/or use of iterative reconstruction technique. CONTRAST:  OMNIPAQUE  IOHEXOL  350 MG/ML SOLN COMPARISON:  None Available. FINDINGS: VASCULAR Aorta: Moderate calcified plaque. No aneurysm, dissection, or stenosis. Celiac: Partially calcified ostial plaque with only mild stenosis, patent distally with classic trifurcation anatomy. SMA: Partially calcified ostial plaque over length of at least 2.1 cm resulting in mild stenosis, patent distally with classic branch anatomy. Renals: Single bilaterally, both with calcified ostial plaque resulting in short segment mild stenosis, patent distally. IMA: Patent without evidence of aneurysm, dissection, vasculitis or significant stenosis. RIGHT Lower Extremity Inflow: Common iliac eccentric calcified atheromatous plaque throughout. There is stenosis at its bifurcation extending into the proximal common and internal iliac arteries of at least mild severity. There is a short-segment high-grade stenosis in the mid external iliac artery, which  is diffusely atheromatous. Outflow: Common femoral heavily atheromatous, with mild diffuse narrowing. Deep femoral branches patent, atheromatous. SFA calcified plaque proximally with only mild stenosis, patent distally. Popliteal artery scattered calcified plaque above the knee, patent distally. Runoff: Calcified plaque proximally but apparently contiguous distal trifurcation runoff. LEFT Lower Extremity Inflow: Common iliac moderately atheromatous, with stenosis at its bifurcation of at least moderate severity over short segment into the proximal external and internal iliac arteries. Patent stent through the more distal external iliac artery. Outflow: Common femoral moderately atheromatous, patent. Patent stent extends across the SFA origin. There is a distal SFA stent extending into the proximal popliteal artery which is occluded throughout almost its entire length. There is collateral reconstitution of a diminutive native popliteal above the knee, which is patent distally. Runoff: Posterior tibial occludes mid calf. Anterior tibial  is patent across the foot as dorsalis pedis. Peroneal is diminutive, with scattered calcified plaque. Veins: No obvious venous abnormality within the limitations of this arterial phase study. Review of the MIP images confirms the above findings. NON-VASCULAR Lower chest: No pleural or pericardial effusion. Coronary and aortic calcifications. Coarse interstitial opacities in the dependent aspect of both lung bases. Hepatobiliary: Small hyperdensity in the dependent aspect of the nondilated gallbladder suggesting small calcified gallstone. No focal liver lesion or biliary ductal dilatation. Pancreas: Unremarkable. No pancreatic ductal dilatation or surrounding inflammatory changes. Spleen: Normal in size without focal abnormality. Adrenals/Urinary Tract: No adrenal mass. Kidneys symmetric in size with normal enhancement. No hydronephrosis. 1.4 cm probable cortical cyst, left upper pole;  no follow-up origin. Urinary bladder is moderately distended with mild circumferential wall thickening and a few small anterior diverticula. Stomach/Bowel: Stomach is nondistended, unremarkable. Small bowel decompressed. Normal appendix. The colon is partially distended, without acute finding Lymphatic: Left inguinal and external iliac lymph nodes up to a 1.2 cm short axis diameter. No mesenteric or retroperitoneal adenopathy. Reproductive: Mild prostate enlargement with central calcifications. Other: No ascites.  No free air. Musculoskeletal: Left hip arthroplasty .  Advanced right hip DJD. IMPRESSION: 1. Occluded left SFA stent extending into the proximal popliteal artery. 2. Collateral reconstitution of a diminutive native left popliteal above the knee, which is patent distally. 3. Occluded left posterior tibial artery to the mid calf. 4. Patent left external iliac artery stent. 5. Short-segment high-grade stenosis mid right external iliac artery. 6. Cholelithiasis. 7. Mild prostate enlargement with mild bladder wall thickening and diverticula. 8.  Aortic Atherosclerosis (ICD10-I70.0). Electronically Signed   By: JONETTA Faes M.D.   On: 01/16/2024 16:18   DG Chest Port 1 View Result Date: 01/16/2024 CLINICAL DATA:  Questionable sepsis.  Fever and tremors. EXAM: PORTABLE CHEST 1 VIEW COMPARISON:  12/13/2023 and CT chest 10/21/2021. FINDINGS: Trachea is midline. Heart size normal. Thoracic aorta is calcified. Mild linear scarring in the left lower lobe. Lungs are otherwise clear. No pleural fluid. IMPRESSION: No acute findings. Electronically Signed   By: Newell Eke M.D.   On: 01/16/2024 14:29   DG Foot 2 Views Left Result Date: 01/16/2024 CLINICAL DATA:  Sepsis, fever and tremors. EXAM: LEFT FOOT - 2 VIEW COMPARISON:  None Available. FINDINGS: Hallux valgus. Mild degenerative changes of the first metatarsophalangeal joint. Osteopenia. Small soft tissue wound along the plantar aspect of the posterior  calcaneus. No underlying osseous erosion to suggest osteomyelitis. IMPRESSION: 1. No evidence of osteomyelitis. 2. 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 3. Osteopenia. Electronically Signed   By: Newell Eke M.D.   On: 01/16/2024 14:29    Scheduled Meds:  (feeding supplement) PROSource Plus  30 mL Oral BID BM   aspirin  EC  81 mg Oral Daily   atorvastatin   80 mg Oral QHS   baclofen   5 mg Oral TID   carvedilol   6.25 mg Oral BID WC   Chlorhexidine  Gluconate Cloth  6 each Topical Daily   clopidogrel   75 mg Oral Q breakfast   DULoxetine   30 mg Oral Daily   enoxaparin  (LOVENOX ) injection  40 mg Subcutaneous Q24H   insulin  aspart  0-9 Units Subcutaneous TID WC   levETIRAcetam   1,500 mg Oral BID   melatonin  6 mg Oral QHS   multivitamin with minerals  1 tablet Oral Daily   pantoprazole   40 mg Oral Daily   PARoxetine   40 mg Oral Daily   tamsulosin   0.4 mg  Oral Daily   valproic  acid  750 mg Oral BID   Continuous Infusions:  ceFEPime  (MAXIPIME ) IV 2 g (01/17/24 1253)   lactated ringers  100 mL/hr at 01/17/24 1156   metronidazole  Stopped (01/17/24 0914)   vancomycin  Stopped (01/17/24 0844)     LOS: 1 day   Time spent: 60 mins  Vanna Shavers Vicci, MD How to contact the [facility] Attending or Consulting provider 7A - 7P or covering provider during after hours 7P -7A, for this patient?  Check the care team in [facility] and look for a) attending/consulting [facility] provider listed and b) the TR[facility] team listed Log into [email or web address] to find provider on call.  Locate the [facility] provider you are looking for under [facility] and page to a number that you can be directly reached. If you still have difficulty reaching the provider, please page the [facility] (Director on Call) for the Hospitalists listed on amion for assistance.  01/17/2024, 1:41 PM

## 2024-01-17 NOTE — Hospital Course (Addendum)
 68 y.o. male with medical history significant for dementia, type 2 diabetes mellitus, hypertension, PAD s/p stenting, epilepsy, depression, stroke presented from LTC with report of fevers, tremors that started 6/23. He apparently had been on a 14 day course of ertapenem at SNF for diabetic foot infection/ulcer. He was noted to have fever prior to admission with necrotic appearance on foot. CT angio showed occluded left SFA stent extending into the proximal popliteal artery, occluded left posterior tibial artery to the mid calf amongst other findings.  Patient was admitted for further evaluation.     Assessment & Plan:   Sepsis from LLE cellulitis - Presented with sepsis criteria (tachycardia, leukocytosis, tachypnea, normal lactic acid, source), fever 103 F at SNF,  chronic  left lower extremity wounds which may be etiology of infection.   -Left foot x-ray-no evidence of osteomyelitis.   -UA with trace leukocytes not quite convincing as etiology -Blood cultures no growth, urine culture insignificant growth. - Adequate course of antibiotics completed for now.  He is nontoxic-appearing and afebrile. -Discontinue antibiotics for now and monitor clinically while awaiting further decisions from [facility] - after discussion with guardianship committee and approval, patient is now being transitioned to DNR/DNI status and pursuing comfort care only with plans to continue patient with hospice upon return to [facility] - pain control and comfort should be main goal in treatment of PAD going forward  PAD (peripheral artery disease) - severe -Surgery and vascular surgery consulted -CT angio bifemoral- Occluded left SFA stent extending into the proximal popliteal artery.  Occluded left posterior tibial artery to the mid calf. Patent left external iliac artery stent. Short-segment high-grade stenosis mid right external iliac artery. - Per vascular surgery, now with nonviable left foot and occluded stents in the  LLE. If aggressive care were pursued then he would require AKA, however this was considered not in patient's best interest and would still result in patient becoming bedbound and not decrease suffering nor increase his QOL.  Case was discussed with guardianship committee and after further review, they are in agreement for transitioning patient to DNR/DNI and comfort care with hospice - Palliative medicine also assisted greatly during hospitalization, appreciate assistance - continue pain control - no further abx courses in pursuit of hospice level comfort care   Decubitus ulcers Left lower extremity decubitus ulcers-on lateral side.  Unstageable.  Black eschar present.  Possible cellulitis, but due to hyperpigmentation hard to tell, but no fluctuance or swelling appreciated.  Left foot x-ray- No acute abnormality. - s/p abx course here too Per MAR-he had been on a 14 day course of  IV Ertepenem for diabetic foot ulcer   Essential hypertension - continue Coreg     Acute metabolic encephalopathy Now more awake. Baseline dementia.  Unable to determine severity of patient's baseline dementia S/p abx course    Dementia without behavioral disturbance Delirium precautions Palliative medicine following   Epilepsy Continue oral Keppra    Controlled type 2 diabetes mellitus - HgbA1c - 5.9%    Pressure Injury 10/04/22 Sacrum Medial Stage 1 -  Intact skin with non-blanchable redness of a localized area usually over a bony prominence. (Active)  10/04/22 2011  Location: Sacrum  Location Orientation: Medial  Staging: Stage 1 -  Intact skin with non-blanchable redness of a localized area usually over a bony prominence.  Wound Description (Comments):   DO NOT USE:  Present on Admission:

## 2024-01-18 DIAGNOSIS — T82856A Stenosis of peripheral vascular stent, initial encounter: Secondary | ICD-10-CM

## 2024-01-18 DIAGNOSIS — F17210 Nicotine dependence, cigarettes, uncomplicated: Secondary | ICD-10-CM

## 2024-01-18 DIAGNOSIS — Z7902 Long term (current) use of antithrombotics/antiplatelets: Secondary | ICD-10-CM

## 2024-01-18 DIAGNOSIS — L97529 Non-pressure chronic ulcer of other part of left foot with unspecified severity: Secondary | ICD-10-CM

## 2024-01-18 DIAGNOSIS — Z79899 Other long term (current) drug therapy: Secondary | ICD-10-CM

## 2024-01-18 DIAGNOSIS — I70245 Atherosclerosis of native arteries of left leg with ulceration of other part of foot: Secondary | ICD-10-CM

## 2024-01-18 DIAGNOSIS — I700 Atherosclerosis of aorta: Secondary | ICD-10-CM

## 2024-01-18 DIAGNOSIS — I70201 Unspecified atherosclerosis of native arteries of extremities, right leg: Secondary | ICD-10-CM

## 2024-01-18 DIAGNOSIS — Z9889 Other specified postprocedural states: Secondary | ICD-10-CM

## 2024-01-18 DIAGNOSIS — R6510 Systemic inflammatory response syndrome (SIRS) of non-infectious origin without acute organ dysfunction: Secondary | ICD-10-CM | POA: Diagnosis not present

## 2024-01-18 DIAGNOSIS — Z7982 Long term (current) use of aspirin: Secondary | ICD-10-CM

## 2024-01-18 LAB — GLUCOSE, CAPILLARY: Glucose-Capillary: 99 mg/dL (ref 70–99)

## 2024-01-18 LAB — CREATININE, SERUM
Creatinine, Ser: 0.88 mg/dL (ref 0.61–1.24)
GFR, Estimated: >60 mL/min (ref >60)

## 2024-01-18 NOTE — Consult Note (Signed)
 Hospital Consult    Reason for Consult: Left lower extremity ulceration Referring Physician: Dr. Leotis MRN #:  [id number]  History of Present Illness: This is a 68 y.o. male history of left iliac as well as femoral-popliteal angioplasty and stenting for limb threatening ischemia last procedure was approximately 15 months ago.  He now presents with ulceration of his left foot is transferred from [facility].  I attempted to asked the patient questions but he appears very confused.  He has difficulty responding to minimal instruction and cannot straighten the left lower extremity.  Per his records he is on aspirin , Plavix  and a statin does not take any blood thinners.  Past Medical History:  Diagnosis Date   Dementia (HCC)    Diabetes mellitus without complication (HCC)    Epilepsy    Hypertension    PAD (peripheral artery disease) (HCC)    [facility]     Past Surgical History:  Procedure Laterality Date   ABDOMINAL AORTOGRAM W/LOWER EXTREMITY N/A 10/04/2022   Procedure: ABDOMINAL AORTOGRAM W/LOWER EXTREMITY;  Surgeon: Eliza Lonni RAMAN, MD;  Location: [facility] INVASIVE CV LAB;  Service: Cardiovascular;  Laterality: N/A;   ABDOMINAL AORTOGRAM W/LOWER EXTREMITY N/A 10/08/2022   Procedure: ABDOMINAL AORTOGRAM W/LOWER EXTREMITY;  Surgeon: Magda Debby SAILOR, MD;  Location: [facility] INVASIVE CV LAB;  Service: Cardiovascular;  Laterality: N/A;   PERIPHERAL VASCULAR INTERVENTION  10/04/2022   Procedure: PERIPHERAL VASCULAR INTERVENTION;  Surgeon: Eliza Lonni RAMAN, MD;  Location: [facility] INVASIVE CV LAB;  Service: Cardiovascular;;   PERIPHERAL VASCULAR INTERVENTION Left 10/08/2022   Procedure: PERIPHERAL VASCULAR INTERVENTION;  Surgeon: Magda Debby SAILOR, MD;  Location: [facility] INVASIVE CV LAB;  Service: Cardiovascular;  Laterality: Left;   TONSILLECTOMY      Allergies  Allergen Reactions   Amoxicillin Other (See Comments)    Unknown reaction per facility   Ampicillin Other (See Comments)     Unknown reaction per facility   Tetracyclines & Related Rash    Prior to Admission medications   Medication Sig Start Date End Date Taking? Authorizing Provider  acetaminophen  (TYLENOL ) 500 MG tablet Take 1,000 mg by mouth in the morning and at bedtime.   Yes [provider]  aspirin  EC 81 MG tablet Take 81 mg by mouth daily. Swallow whole.   Yes [provider]  atorvastatin  (LIPITOR ) 80 MG tablet Take 80 mg by mouth at bedtime. 08/14/21  Yes [provider]  Baclofen  5 MG TABS Take 1 tablet by mouth in the morning, at noon, and at bedtime. 06/16/23  Yes [provider]  carvedilol  (COREG ) 6.25 MG tablet Take 1 tablet (6.25 mg total) by mouth 2 (two) times daily with a meal. 12/16/23  Yes Dennise Lavada POUR, MD  clopidogrel  (PLAVIX ) 75 MG tablet Take 1 tablet (75 mg total) by mouth daily with breakfast. Patient taking differently: Take 75 mg by mouth daily. 10/11/22  Yes Dennise Lavada POUR, MD  DULoxetine  (CYMBALTA ) 30 MG capsule Take 30 mg by mouth daily. Depression 07/23/22  Yes [provider]  ERTAPENEM SODIUM IJ Inject 1 g into the muscle daily. For diabetic foot ulcer for 14 days   Yes [provider]  levETIRAcetam  (KEPPRA ) 750 MG tablet Take 2 tablets (1,500 mg total) by mouth 2 (two) times daily. 12/16/23  Yes Dennise Lavada POUR, MD  LORazepam  (ATIVAN ) 1 MG tablet Take 1 mg by mouth daily. Muscle spasticity   Yes [provider]  melatonin 3 MG TABS tablet Take 2 tablets (6 mg  total) by mouth at bedtime. 03/03/23  Yes Pearlean Manus, MD  Multiple Vitamin (MULTIVITAMIN) tablet Take 1 tablet by mouth daily.   Yes [provider]  Nutritional Supplement LIQD Take 120 mLs by mouth in the morning and at bedtime.   Yes [provider]  pantoprazole  (PROTONIX ) 40 MG tablet Take 1 tablet (40 mg total) by mouth daily. 10/11/22  Yes Singh, Prashant K, MD  PARoxetine  (PAXIL ) 40 MG tablet Take 40 mg by mouth daily. Mood  disorder 06/26/23  Yes [provider]  tamsulosin  (FLOMAX ) 0.4 MG CAPS capsule Take 1 capsule (0.4 mg total) by mouth daily. 10/11/22  Yes Dennise Lavada POUR, MD  traMADol  (ULTRAM ) 50 MG tablet Take 50 mg by mouth every 6 (six) hours as needed for severe pain (pain score 7-10) or moderate pain (pain score 4-6).   Yes [provider]  valproic  acid (DEPAKENE ) 250 MG/5ML solution Take 15 mLs (750 mg total) by mouth 2 (two) times daily. 12/16/23  Yes Dennise Lavada POUR, MD    Social History   Socioeconomic History   Marital status: Single    Spouse name: Not on file   Number of children: 1   Years of education: Not on file   Highest education level: Not on file  Occupational History   Not on file  Tobacco Use   Smoking status: Every Day    Current packs/day: 1.00    Types: Cigarettes   Smokeless tobacco: Not on file  Vaping Use   Vaping status: Never Used  Substance and Sexual Activity   Alcohol use: Not Currently   Drug use: Not Currently   Sexual activity: Not Currently  Other Topics Concern   Not on file  Social History Narrative   Are you right handed or left handed? Right handed    Do you live at home alone? Lives in family care home           Social Drivers of Health   Financial Resource Strain: Not on file  Food Insecurity: Patient Unable To Answer (01/16/2024)   Hunger Vital Sign    Worried About Running Out of Food in the Last Year: Patient unable to answer    Ran Out of Food in the Last Year: Patient unable to answer  Transportation Needs: Patient Unable To Answer (01/16/2024)   PRAPARE - Transportation    Lack of Transportation (Medical): Patient unable to answer    Lack of Transportation (Non-Medical): Patient unable to answer  Physical Activity: Not on file  Stress: Not on file  Social Connections: Patient Unable To Answer (01/16/2024)   Social Connection and Isolation Panel    Frequency of Communication with Friends and Family: Patient unable to  answer    Frequency of Social Gatherings with Friends and Family: Patient unable to answer    Attends Religious Services: Patient unable to answer    Active Member of Clubs or Organizations: Patient unable to answer    Attends Banker Meetings: Patient unable to answer    Marital Status: Patient unable to answer  Intimate Partner Violence: Patient Unable To Answer (01/16/2024)   Humiliation, Afraid, Rape, and Kick questionnaire    Fear of Current or Ex-Partner: Patient unable to answer    Emotionally Abused: Patient unable to answer    Physically Abused: Patient unable to answer    Sexually Abused: Patient unable to answer     History reviewed. No pertinent family history.  ROS: Unable to obtain secondary to  patient confusion  Physical Examination  Vitals:   01/18/24 0424 01/18/24 0817  BP: (!) 130/57 (!) 101/52  Pulse: (!) 104 (!) 109  Resp: 18 19  Temp: 97.7 F (36.5 C) 98.3 F (36.8 C)  SpO2: 97% 98%   Body mass index is 21.36 kg/m.  Patient is confused and he is oriented to person only There is a strongly palpable left common femoral pulse no pulses in the left lower extremity below the The left knee appears to be contracted and he cannot straighten       CBC    Component Value Date/Time   WBC 17.7 (H) 01/17/2024 0416   RBC 4.00 (L) 01/17/2024 0416   HGB 11.4 (L) 01/17/2024 0416   HCT 37.6 (L) 01/17/2024 0416   PLT 303 01/17/2024 0416   MCV 94.0 01/17/2024 0416   MCH 28.5 01/17/2024 0416   MCHC 30.3 01/17/2024 0416   RDW 14.4 01/17/2024 0416   LYMPHSABS 2.8 01/16/2024 1244   MONOABS 2.1 (H) 01/16/2024 1244   EOSABS 0.5 01/16/2024 1244   BASOSABS 0.0 01/16/2024 1244    BMET    Component Value Date/Time   NA 139 01/17/2024 0416   K 4.1 01/17/2024 0416   CL 105 01/17/2024 0416   CO2 25 01/17/2024 0416   GLUCOSE 107 (H) 01/17/2024 0416   BUN 9 01/17/2024 0416   CREATININE 0.80 01/17/2024 0416   CALCIUM  9.0 01/17/2024 0416   GFRNONAA  >60 01/17/2024 0416    COAGS: Lab Results  Component Value Date   INR 1.1 01/16/2024   INR 1.3 (H) 07/02/2023   INR 1.2 07/01/2023     Non-Invasive Vascular Imaging:   IMPRESSION: 1. Occluded left SFA stent extending into the proximal popliteal artery. 2. Collateral reconstitution of a diminutive native left popliteal above the knee, which is patent distally. 3. Occluded left posterior tibial artery to the mid calf. 4. Patent left external iliac artery stent. 5. Short-segment high-grade stenosis mid right external iliac artery. 6. Cholelithiasis. 7. Mild prostate enlargement with mild bladder wall thickening and diverticula. 8.  Aortic Atherosclerosis (ICD10-I70.0).  ASSESSMENT/PLAN: This is a 68 y.o. male with history as above now with non viable left foot and occluded stents in the LLE. I have attempted to contact his legal guardian Sidra Gaskins [phone]) but was not able to contact her this morning.  I will try again later.  Patient is going to be best served with left above-knee amputation.  I will also communicate with his primary vascular surgeon Dr. Magda.  Arlita Buffkin C. Sheree, MD [facility] Office: [phone] Pager: [phone]

## 2024-01-18 NOTE — Plan of Care (Signed)
  Problem: Education: Goal: Knowledge of General Education information will improve Description: Including pain rating scale, medication(s)/side effects and non-pharmacologic comfort measures Outcome: Progressing   Problem: Health Behavior/Discharge Planning: Goal: Ability to manage health-related needs will improve Outcome: Progressing   Problem: Clinical Measurements: Goal: Ability to maintain clinical measurements within normal limits will improve Outcome: Progressing Goal: Will remain free from infection Outcome: Progressing Goal: Diagnostic test results will improve Outcome: Progressing Goal: Respiratory complications will improve Outcome: Progressing Goal: Cardiovascular complication will be avoided Outcome: Progressing   Problem: Activity: Goal: Risk for activity intolerance will decrease Outcome: Progressing   Problem: Nutrition: Goal: Adequate nutrition will be maintained Outcome: Progressing   Problem: Coping: Goal: Level of anxiety will decrease Outcome: Progressing   Problem: Elimination: Goal: Will not experience complications related to bowel motility Outcome: Progressing Goal: Will not experience complications related to urinary retention Outcome: Progressing   Problem: Pain Managment: Goal: General experience of comfort will improve and/or be controlled Outcome: Progressing   Problem: Safety: Goal: Ability to remain free from injury will improve Outcome: Progressing   Problem: Skin Integrity: Goal: Risk for impaired skin integrity will decrease Outcome: Progressing   Problem: Education: Goal: Ability to describe self-care measures that may prevent or decrease complications (Diabetes Survival Skills Education) will improve Outcome: Progressing Goal: Individualized Educational Video(s) Outcome: Progressing   Problem: Coping: Goal: Ability to adjust to condition or change in health will improve Outcome: Progressing   Problem: Fluid  Volume: Goal: Ability to maintain a balanced intake and output will improve Outcome: Progressing   Problem: Health Behavior/Discharge Planning: Goal: Ability to identify and utilize available resources and services will improve Outcome: Progressing Goal: Ability to manage health-related needs will improve Outcome: Progressing   Problem: Metabolic: Goal: Ability to maintain appropriate glucose levels will improve Outcome: Progressing   Problem: Nutritional: Goal: Maintenance of adequate nutrition will improve Outcome: Progressing Goal: Progress toward achieving an optimal weight will improve Outcome: Progressing   Problem: Skin Integrity: Goal: Risk for impaired skin integrity will decrease Outcome: Progressing   Problem: Tissue Perfusion: Goal: Adequacy of tissue perfusion will improve Outcome: Progressing   Problem: Education: Goal: Knowledge of General Education information will improve Description: Including pain rating scale, medication(s)/side effects and non-pharmacologic comfort measures Outcome: Progressing   Problem: Health Behavior/Discharge Planning: Goal: Ability to manage health-related needs will improve Outcome: Progressing   Problem: Clinical Measurements: Goal: Ability to maintain clinical measurements within normal limits will improve Outcome: Progressing Goal: Will remain free from infection Outcome: Progressing Goal: Diagnostic test results will improve Outcome: Progressing Goal: Respiratory complications will improve Outcome: Progressing Goal: Cardiovascular complication will be avoided Outcome: Progressing   Problem: Activity: Goal: Risk for activity intolerance will decrease Outcome: Progressing   Problem: Nutrition: Goal: Adequate nutrition will be maintained Outcome: Progressing   Problem: Coping: Goal: Level of anxiety will decrease Outcome: Progressing   Problem: Elimination: Goal: Will not experience complications related to  bowel motility Outcome: Progressing Goal: Will not experience complications related to urinary retention Outcome: Progressing   Problem: Pain Managment: Goal: General experience of comfort will improve and/or be controlled Outcome: Progressing   Problem: Safety: Goal: Ability to remain free from injury will improve Outcome: Progressing   Problem: Skin Integrity: Goal: Risk for impaired skin integrity will decrease Outcome: Progressing

## 2024-01-18 NOTE — Progress Notes (Signed)
 PROGRESS NOTE    Jacob Mcgrath.  FMW:[id number] DOB: 12-15-1955 DOA: 01/16/2024 PCP: [name], MD   Brief Narrative:  This 68 yrs old male with medical history significant for dementia, type 2 diabetes mellitus, hypertension, PAD s/p stenting, epilepsy with history of status epilepticus, depression, history of stroke.  Patient was brought to the ED from [facility] long term care with report of fevers, tremors that started 6/23.  He apparently had been on a 14 day course of ertapenem at SNF for diabetic foot infection/ulcer.  Facility reports fever of 103 this morning, He was given Tylenol  prior to arrival.  They note necrotic appearing areas on feet.  Workup in the ED reveals Left foot xray  > No evidence of osteomyelitis.  CT angio shows occluded vasculature.  Patient was initiated on IV antibiotics.  Patient was admitted for further evaluation.  General Surgery and vascular surgery is consulted.  Assessment & Plan:   Principal Problem:   SIRS (systemic inflammatory response syndrome) (HCC) Active Problems:   Stroke (HCC)   Acute metabolic encephalopathy   Decubitus ulcers   PAD (peripheral artery disease) (HCC)   Controlled type 2 diabetes mellitus without complication, without long-term current use of insulin  (HCC)   Depression   Dementia without behavioral disturbance (HCC)   Essential hypertension   Seizure (HCC)   Ulcer of left foot (HCC)   Sepsis from LLE cellulitis : Patient presented with sepsis criteria (tachycardia, leukocytosis, tachypnea, normal lactic acid, source) Nursing home reports fever- 103, he was given Tylenol  prior to arrival.   He has chronic left left lower extremity wounds which may be etiology of infection.   Left foot x-ray-no evidence of osteomyelitis.   UA with trace leukocytes not quite convincing as etiology.  Continue IV fluid resuscitation. Continue broad-spectrum antibiotics ( IV vancomycin , cefepime  and  metronidazole ) Follow-up blood and urine cultures   PAD (peripheral artery disease) - severe : At this time No suggestion of ischemia. Follows with vascular surgeon Dr. Magda -last visit 04/2023.  CT angio bifemoral- Occluded left SFA stent extending into the proximal popliteal artery.  Occluded left posterior tibial artery to the mid calf. Patent left external iliac artery stent. Short-segment high-grade stenosis mid right external iliac artery. Resume Plavix  General Surgeon Dr. Evonnie, recommended  inpatient vascular surgery consultation,  Patient was evaluated by Dr. Sheree recommended left above-knee amputation.   Decubitus ulcers: Left lower extremity decubitus ulcers-on lateral side.  Unstageable.  Black eschar present.  Possible cellulitis, but due to hyperpigmentation hard to tell, but no fluctuance or swelling appreciated.  Left foot x-ray-acute of acute abnormality. Continue Broad-spectrum antibiotics for now Per MAR-he had been on a 14 day course of  IV Ertepenem for diabetic foot ulcer   Essential hypertension: Blood pressure soft but improving after hydration. Resume carvedilol  6.25 mg twice daily with hold parameters.   Acute metabolic encephalopathy: Now more awake. Baseline dementia.   Unable to determine severity of patient's baseline dementia.    Continue IV antibiotics, IV fluids.   Dementia without behavioral disturbance ; Delirium precautions He needs goals of care Discussion.   Epilepsy: Resumed oral Keppra , with Percocet as IV medication, pending improvement in mental status.   Controlled type 2 diabetes mellitus: Hypoglycemia    - HgbA1c - 5.9%  - DC SSI coverage - he doesn't need it - try to avoid hypoglycemia    DVT prophylaxis: Lovenox  Code Status: Full code Family Communication: No family at bedside Disposition Plan:  Status is: Inpatient Remains inpatient appropriate because: Inpatient  Admitted for decreased left lower extremity  perfusion,  vascular surgery recommended above-knee amputation.  Consultants:  Surgery Vascular surgery  Procedures: CT Angio  Antimicrobials:  Anti-infectives (From admission, onward)    Start     Dose/Rate Route Frequency Ordered Stop   01/17/24 0800  vancomycin  (VANCOREADY) IVPB 750 mg/150 mL        750 mg 150 mL/hr over 60 Minutes Intravenous Every 12 hours 01/16/24 2112     01/16/24 2200  metroNIDAZOLE  (FLAGYL ) IVPB 500 mg        500 mg 100 mL/hr over 60 Minutes Intravenous Every 12 hours 01/16/24 2104     01/16/24 2200  ceFEPIme  (MAXIPIME ) 2 g in sodium chloride  0.9 % 100 mL IVPB        2 g 200 mL/hr over 30 Minutes Intravenous Every 8 hours 01/16/24 2107     01/16/24 2200  vancomycin  (VANCOREADY) IVPB 500 mg/100 mL        500 mg 100 mL/hr over 60 Minutes Intravenous  Once 01/16/24 2108 01/17/24 0727   01/16/24 1500  vancomycin  (VANCOCIN ) IVPB 1000 mg/200 mL premix        1,000 mg 200 mL/hr over 60 Minutes Intravenous  Once 01/16/24 1449 01/16/24 1618   01/16/24 1245  cefTRIAXone  (ROCEPHIN ) 1 g in sodium chloride  0.9 % 100 mL IVPB        1 g 200 mL/hr over 30 Minutes Intravenous  Once 01/16/24 1239 01/16/24 1436       Subjective: Patient was seen and examined at bedside. Overnight events noted. Patient has baseline dementia,  following simple commands otherwise appears at his baseline. Patient has decreased perfusion in the left lower extremity.  Objective: Vitals:   01/17/24 2200 01/18/24 0016 01/18/24 0424 01/18/24 0817  BP:  127/60 (!) 130/57 (!) 101/52  Pulse:  (!) 102 (!) 104 (!) 109  Resp:  20 18 19   Temp:  98.1 F (36.7 C) 97.7 F (36.5 C) 98.3 F (36.8 C)  TempSrc:      SpO2: 92% 100% 97% 98%  Weight:      Height:        Intake/Output Summary (Last 24 hours) at 01/18/2024 1205 Last data filed at 01/18/2024 1014 Gross per 24 hour  Intake 789.92 ml  Output 1050 ml  Net -260.08 ml   Filed Weights   01/16/24 1223 01/16/24 2000  Weight: 62.3 kg  65.6 kg    Examination:  General exam: Appears calm and comfortable, appears deconditioned, not in any acute distress. Respiratory system: CTA Bilaterally. Respiratory effort normal. RR 16 Cardiovascular system: S1 & S2 heard, RRR. No JVD, murmurs, rubs, gallops or clicks. Gastrointestinal system: Abdomen is non distended, soft and non tender. Normal bowel sounds heard. Central nervous system: Alert and oriented x 1. No focal neurological deficits. Extremities: Left hemiparesis.  Necrotic appearing skin on the feet and sacrum Skin: No rashes, lesions or ulcers Psychiatry: Judgement and insight appear normal. Mood & affect appropriate.     Data Reviewed: I have personally reviewed following labs and imaging studies  CBC: Recent Labs  Lab 01/16/24 1244 01/17/24 0416  WBC 15.1* 17.7*  NEUTROABS 9.5*  --   HGB 9.6* 11.4*  HCT 30.0* 37.6*  MCV 90.6 94.0  PLT 284 303   Basic Metabolic Panel: Recent Labs  Lab 01/16/24 1244 01/17/24 0416  NA 139 139  K 4.2 4.1  CL 105 105  CO2 25 25  GLUCOSE 123* 107*  BUN 12 9  CREATININE 0.84 0.80  CALCIUM  8.6* 9.0   GFR: Estimated Creatinine Clearance: 83.1 mL/min (by C-G formula based on SCr of 0.8 mg/dL). Liver Function Tests: Recent Labs  Lab 01/16/24 1244  AST 31  ALT 17  ALKPHOS 61  BILITOT 0.6  PROT 7.4  ALBUMIN 2.6*   No results for input(s): LIPASE, AMYLASE in the last 168 hours. No results for input(s): AMMONIA in the last 168 hours. Coagulation Profile: Recent Labs  Lab 01/16/24 1244  INR 1.1   Cardiac Enzymes: No results for input(s): CKTOTAL, CKMB, CKMBINDEX, TROPONINI in the last 168 hours. BNP (last 3 results) No results for input(s): PROBNP in the last 8760 hours. HbA1C: Recent Labs    01/16/24 1244  HGBA1C 5.9*   CBG: Recent Labs  Lab 01/17/24 0737 01/17/24 1142 01/17/24 1610 01/17/24 1851 01/17/24 2255  GLUCAP 104* 95 94 108* 152*   Lipid Profile: No results for  input(s): CHOL, HDL, LDLCALC, TRIG, CHOLHDL, LDLDIRECT in the last 72 hours. Thyroid Function Tests: No results for input(s): TSH, T4TOTAL, FREET4, T3FREE, THYROIDAB in the last 72 hours. Anemia Panel: No results for input(s): VITAMINB12, FOLATE, FERRITIN, TIBC, IRON, RETICCTPCT in the last 72 hours. Sepsis Labs: Recent Labs  Lab 01/16/24 1244  LATICACIDVEN 1.3    Recent Results (from the past 240 hours)  Blood Culture (routine x 2)     Status: None (Preliminary result)   Collection Time: 01/16/24 12:44 PM   Specimen: BLOOD  Result Value Ref Range Status   Specimen Description BLOOD BLOOD RIGHT ARM  Final   Special Requests   Final    BOTTLES DRAWN AEROBIC AND ANAEROBIC Blood Culture results may not be optimal due to an inadequate volume of blood received in culture bottles   Culture   Final    NO GROWTH 2 DAYS Performed at [facility], [address] [place], [place] [address]    Report Status PENDING  Incomplete  Blood Culture (routine x 2)     Status: None (Preliminary result)   Collection Time: 01/16/24 12:55 PM   Specimen: BLOOD  Result Value Ref Range Status   Specimen Description BLOOD LEFT ASSIST CONTROL  Final   Special Requests   Final    BOTTLES DRAWN AEROBIC AND ANAEROBIC Blood Culture adequate volume   Culture   Final    NO GROWTH 2 DAYS Performed at [facility], [address] [place], [place] [address]    Report Status PENDING  Incomplete  Resp panel by RT-PCR (RSV, Flu A&B, Covid) Anterior Nasal Swab     Status: None   Collection Time: 01/16/24  1:07 PM   Specimen: Anterior Nasal Swab  Result Value Ref Range Status   SARS Coronavirus 2 by RT PCR NEGATIVE NEGATIVE Final    Comment: (NOTE) SARS-CoV-2 target nucleic acids are NOT DETECTED.  The SARS-CoV-2 RNA is generally detectable in upper respiratory specimens during the acute phase of infection. The lowest concentration of SARS-CoV-2 viral copies this assay can detect  is 138 copies/mL. A negative result does not preclude SARS-Cov-2 infection and should not be used as the sole basis for treatment or other patient management decisions. A negative result may occur with  improper specimen collection/handling, submission of specimen other than nasopharyngeal swab, presence of viral mutation(s) within the areas targeted by this assay, and inadequate number of viral copies(<138 copies/mL). A negative result must be combined with clinical observations, patient history, and epidemiological information. The expected  result is Negative.  Fact Sheet for Patients:  BloggerCourse.com  Fact Sheet for Healthcare Providers:  SeriousBroker.it  This test is no t yet approved or cleared by the [place]  FDA and  has been authorized for detection and/or diagnosis of SARS-CoV-2 by FDA under an Emergency Use Authorization (EUA). This EUA will remain  in effect (meaning this test can be used) for the duration of the COVID-19 declaration under Section 564(b)(1) of the Act, 21 U.S.C.section 360bbb-3(b)(1), unless the authorization is terminated  or revoked sooner.       Influenza A by PCR NEGATIVE NEGATIVE Final   Influenza B by PCR NEGATIVE NEGATIVE Final    Comment: (NOTE) The Xpert Xpress SARS-CoV-2/FLU/RSV plus assay is intended as an aid in the diagnosis of influenza from Nasopharyngeal swab specimens and should not be used as a sole basis for treatment. Nasal washings and aspirates are unacceptable for Xpert Xpress SARS-CoV-2/FLU/RSV testing.  Fact Sheet for Patients: BloggerCourse.com  Fact Sheet for Healthcare Providers: SeriousBroker.it  This test is not yet approved or cleared by the [place]  FDA and has been authorized for detection and/or diagnosis of SARS-CoV-2 by FDA under an Emergency Use Authorization (EUA). This EUA will remain in effect  (meaning this test can be used) for the duration of the COVID-19 declaration under Section 564(b)(1) of the Act, 21 U.S.C. section 360bbb-3(b)(1), unless the authorization is terminated or revoked.     Resp Syncytial Virus by PCR NEGATIVE NEGATIVE Final    Comment: (NOTE) Fact Sheet for Patients: BloggerCourse.com  Fact Sheet for Healthcare Providers: SeriousBroker.it  This test is not yet approved or cleared by the [place]  FDA and has been authorized for detection and/or diagnosis of SARS-CoV-2 by FDA under an Emergency Use Authorization (EUA). This EUA will remain in effect (meaning this test can be used) for the duration of the COVID-19 declaration under Section 564(b)(1) of the Act, 21 U.S.C. section 360bbb-3(b)(1), unless the authorization is terminated or revoked.  Performed at [facility], [address], [place], [place] [address]   Urine Culture     Status: Abnormal   Collection Time: 01/16/24  1:42 PM   Specimen: Urine, Random  Result Value Ref Range Status   Specimen Description   Final    URINE, RANDOM Performed at [facility], [address] [place], [place] [address]    Special Requests   Final    NONE Reflexed from [id number] Performed at [facility], [address], [place], [place] [address]    Culture (A)  Final    <10,000 COLONIES/mL INSIGNIFICANT GROWTH Performed at [facility], [address], [place], [place] [address]    Report Status 01/17/2024 FINAL  Final  MRSA Next Gen by PCR, Nasal     Status: Abnormal   Collection Time: 01/16/24  7:40 PM   Specimen: Nasal Mucosa; Nasal Swab  Result Value Ref Range Status   MRSA by PCR Next Gen DETECTED (A) NOT DETECTED Final    Comment: RESULT CALLED TO, READ BACK BY AND VERIFIED WITH: VELMA LINGER, RN AT 2136 01/16/24 BY A. SNYDER (NOTE) The GeneXpert MRSA Assay (FDA approved for NASAL specimens only), is one component of a comprehensive MRSA  colonization surveillance program. It is not intended to diagnose MRSA infection nor to guide or monitor treatment for MRSA infections. Test performance is not FDA approved in patients less than 5 years old. Performed at [facility], [address] [place], [place] [address]     Radiology  Studies: CT Angio Aortobifemoral W and/or Wo Contrast Result Date: 01/16/2024 CLINICAL DATA:  Left lower extremity ischemia, fever, tremors, dementia EXAM: CT ANGIOGRAPHY OF ABDOMINAL AORTA WITH ILIOFEMORAL RUNOFF TECHNIQUE: Multidetector CT imaging of the abdomen, pelvis and lower extremities was performed using the standard protocol during bolus administration of intravenous contrast. Multiplanar CT image reconstructions and MIPs were obtained to evaluate the vascular anatomy. RADIATION DOSE REDUCTION: This exam was performed according to the departmental dose-optimization program which includes automated exposure control, adjustment of the mA and/or kV according to patient size and/or use of iterative reconstruction technique. CONTRAST:  OMNIPAQUE  IOHEXOL  350 MG/ML SOLN COMPARISON:  None Available. FINDINGS: VASCULAR Aorta: Moderate calcified plaque. No aneurysm, dissection, or stenosis. Celiac: Partially calcified ostial plaque with only mild stenosis, patent distally with classic trifurcation anatomy. SMA: Partially calcified ostial plaque over length of at least 2.1 cm resulting in mild stenosis, patent distally with classic branch anatomy. Renals: Single bilaterally, both with calcified ostial plaque resulting in short segment mild stenosis, patent distally. IMA: Patent without evidence of aneurysm, dissection, vasculitis or significant stenosis. RIGHT Lower Extremity Inflow: Common iliac eccentric calcified atheromatous plaque throughout. There is stenosis at its bifurcation extending into the proximal common and internal iliac arteries of at least mild severity. There is a short-segment high-grade stenosis  in the mid external iliac artery, which is diffusely atheromatous. Outflow: Common femoral heavily atheromatous, with mild diffuse narrowing. Deep femoral branches patent, atheromatous. SFA calcified plaque proximally with only mild stenosis, patent distally. Popliteal artery scattered calcified plaque above the knee, patent distally. Runoff: Calcified plaque proximally but apparently contiguous distal trifurcation runoff. LEFT Lower Extremity Inflow: Common iliac moderately atheromatous, with stenosis at its bifurcation of at least moderate severity over short segment into the proximal external and internal iliac arteries. Patent stent through the more distal external iliac artery. Outflow: Common femoral moderately atheromatous, patent. Patent stent extends across the SFA origin. There is a distal SFA stent extending into the proximal popliteal artery which is occluded throughout almost its entire length. There is collateral reconstitution of a diminutive native popliteal above the knee, which is patent distally. Runoff: Posterior tibial occludes mid calf. Anterior tibial is patent across the foot as dorsalis pedis. Peroneal is diminutive, with scattered calcified plaque. Veins: No obvious venous abnormality within the limitations of this arterial phase study. Review of the MIP images confirms the above findings. NON-VASCULAR Lower chest: No pleural or pericardial effusion. Coronary and aortic calcifications. Coarse interstitial opacities in the dependent aspect of both lung bases. Hepatobiliary: Small hyperdensity in the dependent aspect of the nondilated gallbladder suggesting small calcified gallstone. No focal liver lesion or biliary ductal dilatation. Pancreas: Unremarkable. No pancreatic ductal dilatation or surrounding inflammatory changes. Spleen: Normal in size without focal abnormality. Adrenals/Urinary Tract: No adrenal mass. Kidneys symmetric in size with normal enhancement. No hydronephrosis. 1.4 cm  probable cortical cyst, left upper pole; no follow-up origin. Urinary bladder is moderately distended with mild circumferential wall thickening and a few small anterior diverticula. Stomach/Bowel: Stomach is nondistended, unremarkable. Small bowel decompressed. Normal appendix. The colon is partially distended, without acute finding Lymphatic: Left inguinal and external iliac lymph nodes up to a 1.2 cm short axis diameter. No mesenteric or retroperitoneal adenopathy. Reproductive: Mild prostate enlargement with central calcifications. Other: No ascites.  No free air. Musculoskeletal: Left hip arthroplasty .  Advanced right hip DJD. IMPRESSION: 1. Occluded left SFA stent extending into the proximal popliteal artery. 2. Collateral reconstitution of a diminutive native left popliteal  above the knee, which is patent distally. 3. Occluded left posterior tibial artery to the mid calf. 4. Patent left external iliac artery stent. 5. Short-segment high-grade stenosis mid right external iliac artery. 6. Cholelithiasis. 7. Mild prostate enlargement with mild bladder wall thickening and diverticula. 8.  Aortic Atherosclerosis (ICD10-I70.0). Electronically Signed   By: JONETTA Faes M.D.   On: 01/16/2024 16:18   DG Chest Port 1 View Result Date: 01/16/2024 CLINICAL DATA:  Questionable sepsis.  Fever and tremors. EXAM: PORTABLE CHEST 1 VIEW COMPARISON:  12/13/2023 and CT chest 10/21/2021. FINDINGS: Trachea is midline. Heart size normal. Thoracic aorta is calcified. Mild linear scarring in the left lower lobe. Lungs are otherwise clear. No pleural fluid. IMPRESSION: No acute findings. Electronically Signed   By: Newell Eke M.D.   On: 01/16/2024 14:29   DG Foot 2 Views Left Result Date: 01/16/2024 CLINICAL DATA:  Sepsis, fever and tremors. EXAM: LEFT FOOT - 2 VIEW COMPARISON:  None Available. FINDINGS: Hallux valgus. Mild degenerative changes of the first metatarsophalangeal joint. Osteopenia. Small soft tissue wound along  the plantar aspect of the posterior calcaneus. No underlying osseous erosion to suggest osteomyelitis. IMPRESSION: 1. No evidence of osteomyelitis. 2. 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 3. Osteopenia. Electronically Signed   By: Newell Eke M.D.   On: 01/16/2024 14:29   Scheduled Meds:  (feeding supplement) PROSource Plus  30 mL Oral BID BM   aspirin  EC  81 mg Oral Daily   atorvastatin   80 mg Oral QHS   baclofen   5 mg Oral TID   carvedilol   6.25 mg Oral BID WC   Chlorhexidine  Gluconate Cloth  6 each Topical Daily   clopidogrel   75 mg Oral Q breakfast   DULoxetine   30 mg Oral Daily   enoxaparin  (LOVENOX ) injection  40 mg Subcutaneous Q24H   levETIRAcetam   1,500 mg Oral BID   melatonin  6 mg Oral QHS   multivitamin with minerals  1 tablet Oral Daily   pantoprazole   40 mg Oral Daily   PARoxetine   40 mg Oral Daily   tamsulosin   0.4 mg Oral Daily   valproic  acid  750 mg Oral BID   Continuous Infusions:  ceFEPime  (MAXIPIME ) IV 2 g (01/18/24 0625)   metronidazole  500 mg (01/17/24 2104)   vancomycin  750 mg (01/18/24 0946)     LOS: 2 days    Time spent: 50 mins    Darcel Dawley, MD [facility]   If 7PM-7AM, please contact night-coverage

## 2024-01-18 NOTE — Progress Notes (Addendum)
   01/17/24 1510  Spiritual Encounters  Type of Visit Initial  Care provided to: Patient  Conversation partners present during encounter Nurse  Referral source Chaplain team  Reason for visit Routine spiritual support  OnCall Visit No   Chaplain visited Pt as the referral of the lead chaplain. Pt was awake and willing to speak with me.  Chaplain established a relationship of trust and practiced compassionate presence. Upon exploration of feelings Pt shared his anxiety over losing his foot.  Chaplain acknowledged Pt's fear and attempted to explore Pt's coping skills.  Pt at this point was mildly confused and unclear as to what things he typically does to handle stress.  Pt did confess faith in [place] higher power and asked chaplain to provide the ritual of prayer.  No further spiritual care interventions planned as Pt is expected to be transferred.  Maude Roll, Chaplain

## 2024-01-19 DIAGNOSIS — Z9889 Other specified postprocedural states: Secondary | ICD-10-CM

## 2024-01-19 DIAGNOSIS — T82898A Other specified complication of vascular prosthetic devices, implants and grafts, initial encounter: Secondary | ICD-10-CM

## 2024-01-19 DIAGNOSIS — Z7982 Long term (current) use of aspirin: Secondary | ICD-10-CM

## 2024-01-19 DIAGNOSIS — R6510 Systemic inflammatory response syndrome (SIRS) of non-infectious origin without acute organ dysfunction: Secondary | ICD-10-CM | POA: Diagnosis not present

## 2024-01-19 DIAGNOSIS — Z79899 Other long term (current) drug therapy: Secondary | ICD-10-CM

## 2024-01-19 DIAGNOSIS — I70245 Atherosclerosis of native arteries of left leg with ulceration of other part of foot: Secondary | ICD-10-CM

## 2024-01-19 DIAGNOSIS — Z7902 Long term (current) use of antithrombotics/antiplatelets: Secondary | ICD-10-CM

## 2024-01-19 DIAGNOSIS — L97529 Non-pressure chronic ulcer of other part of left foot with unspecified severity: Secondary | ICD-10-CM

## 2024-01-19 DIAGNOSIS — F17210 Nicotine dependence, cigarettes, uncomplicated: Secondary | ICD-10-CM

## 2024-01-19 LAB — CBC
HCT: 26.7 % — ABNORMAL LOW (ref 39.0–52.0)
Hemoglobin: 8.6 g/dL — ABNORMAL LOW (ref 13.0–17.0)
MCH: 29 pg (ref 26.0–34.0)
MCHC: 32.2 g/dL (ref 30.0–36.0)
MCV: 89.9 fL (ref 80.0–100.0)
Platelets: 302 10*3/uL (ref 150–400)
RBC: 2.97 MIL/uL — ABNORMAL LOW (ref 4.22–5.81)
RDW: 14.4 % (ref 11.5–15.5)
WBC: 14.2 10*3/uL — ABNORMAL HIGH (ref 4.0–10.5)
nRBC: 0 % (ref 0.0–0.2)

## 2024-01-19 LAB — BASIC METABOLIC PANEL WITH GFR
Anion gap: 11 (ref 5–15)
BUN: 13 mg/dL (ref 8–23)
CO2: 19 mmol/L — ABNORMAL LOW (ref 22–32)
Calcium: 8.4 mg/dL — ABNORMAL LOW (ref 8.9–10.3)
Chloride: 110 mmol/L (ref 98–111)
Creatinine, Ser: 0.89 mg/dL (ref 0.61–1.24)
GFR, Estimated: >60 mL/min (ref >60)
Glucose, Bld: 112 mg/dL — ABNORMAL HIGH (ref 70–99)
Potassium: 3.7 mmol/L (ref 3.5–5.1)
Sodium: 140 mmol/L (ref 135–145)

## 2024-01-19 LAB — GLUCOSE, CAPILLARY
Glucose-Capillary: 68 mg/dL — ABNORMAL LOW (ref 70–99)
Glucose-Capillary: 147 mg/dL — ABNORMAL HIGH (ref 70–99)
Glucose-Capillary: 85 mg/dL (ref 70–99)
Glucose-Capillary: 125 mg/dL — ABNORMAL HIGH (ref 70–99)

## 2024-01-19 LAB — PHOSPHORUS: Phosphorus: 3.6 mg/dL (ref 2.5–4.6)

## 2024-01-19 LAB — MAGNESIUM: Magnesium: 1.8 mg/dL (ref 1.7–2.4)

## 2024-01-19 MED ORDER — MUPIROCIN 2 % EX OINT
TOPICAL_OINTMENT | Freq: Two times a day (BID) | CUTANEOUS | Status: DC
  Filled 2024-01-19 (×7): qty 22

## 2024-01-19 NOTE — Progress Notes (Signed)
 PROGRESS NOTE    Jacob Mcgrath.  FMW:[id number] DOB: 1955/08/04 DOA: 01/16/2024 PCP: [name], MD   Brief Narrative:  This 68 yrs old male with medical history significant for dementia, type 2 diabetes mellitus, hypertension, PAD s/p stenting, epilepsy with history of status epilepticus, depression, history of stroke.  Patient was brought to the ED from [facility] long term care with report of fevers, tremors that started 6/23.  He apparently had been on a 14 day course of ertapenem at SNF for diabetic foot infection/ulcer.  Facility reports fever of 103 this morning, He was given Tylenol  prior to arrival.  They note necrotic appearing areas on feet.  Workup in the ED reveals Left foot xray  > No evidence of osteomyelitis.  CT angio shows occluded vasculature.  Patient was initiated on IV antibiotics.  Patient was admitted for further evaluation.  General Surgery and vascular surgery is consulted.  Assessment & Plan:   Principal Problem:   SIRS (systemic inflammatory response syndrome) (HCC) Active Problems:   Stroke (HCC)   Acute metabolic encephalopathy   Decubitus ulcers   PAD (peripheral artery disease) (HCC)   Controlled type 2 diabetes mellitus without complication, without long-term current use of insulin  (HCC)   Depression   Dementia without behavioral disturbance (HCC)   Essential hypertension   Seizure (HCC)   Ulcer of left foot (HCC)   Sepsis from LLE cellulitis : Patient presented with sepsis criteria (tachycardia, leukocytosis, tachypnea, normal lactic acid, source) Nursing home reports fever- 103, he was given Tylenol  prior to arrival.   He has chronic left left lower extremity wounds which may be etiology of infection.   Left foot x-ray-no evidence of osteomyelitis.   UA with trace leukocytes not quite convincing as etiology.  Continue IV fluid resuscitation. Continue broad-spectrum antibiotics ( IV vancomycin , cefepime  and  metronidazole ) Follow-up blood and urine cultures. Sepsis physiology improving.   PAD (peripheral artery disease) - severe : At this time No suggestion of ischemia. Follows with vascular surgeon Dr. Magda -last visit 04/2023.  CT angio bifemoral- Occluded left SFA stent extending into the proximal popliteal artery.  Occluded left posterior tibial artery to the mid calf. Patent left external iliac artery stent. Short-segment high-grade stenosis mid right external iliac artery. Resume Plavix  General Surgeon Dr. Evonnie, recommended  inpatient vascular surgery consultation,  Patient was evaluated by Dr. Sheree recommended left above-knee amputation. Vascular surgery is unable to reach legal guardian about decision.  Palliative care consulted to discuss goals of care.   Decubitus ulcers: Left lower extremity decubitus ulcers-on lateral side.  Unstageable.  Black eschar present.  Possible cellulitis, but due to hyperpigmentation hard to tell, but no fluctuance or swelling appreciated.  Left foot x-ray- No acute abnormality. Continue Broad-spectrum antibiotics for now Per MAR-he had been on a 14 day course of  IV Ertepenem for diabetic foot ulcer.   Essential hypertension: Blood pressure soft but improving after hydration. Resume carvedilol  6.25 mg twice daily with hold parameters.   Acute metabolic encephalopathy: Now more awake. Baseline dementia.   Unable to determine severity of patient's baseline dementia.    Continue IV antibiotics, IV fluids.   Dementia without behavioral disturbance ; Delirium precautions He needs goals of care Discussion.   Epilepsy: Resumed oral Keppra .   Controlled type 2 diabetes mellitus: Hypoglycemia .   - HgbA1c - 5.9%  - DC SSI coverage - he doesn't need it - try to avoid hypoglycemia.   DVT prophylaxis: Lovenox  Code Status: Full  code Family Communication: No family at bedside. Disposition Plan:    Status is: Inpatient Remains inpatient  appropriate because: Inpatient  Admitted for decreased left lower extremity perfusion,  vascular surgery recommended above-knee amputation. Palliative care consulted to discuss goals of care.  Consultants:  Surgery Vascular surgery  Procedures: CT Angio  Antimicrobials:  Anti-infectives (From admission, onward)    Start     Dose/Rate Route Frequency Ordered Stop   01/17/24 0800  vancomycin  (VANCOREADY) IVPB 750 mg/150 mL        750 mg 150 mL/hr over 60 Minutes Intravenous Every 12 hours 01/16/24 2112     01/16/24 2200  metroNIDAZOLE  (FLAGYL ) IVPB 500 mg        500 mg 100 mL/hr over 60 Minutes Intravenous Every 12 hours 01/16/24 2104     01/16/24 2200  ceFEPIme  (MAXIPIME ) 2 g in sodium chloride  0.9 % 100 mL IVPB        2 g 200 mL/hr over 30 Minutes Intravenous Every 8 hours 01/16/24 2107     01/16/24 2200  vancomycin  (VANCOREADY) IVPB 500 mg/100 mL        500 mg 100 mL/hr over 60 Minutes Intravenous  Once 01/16/24 2108 01/17/24 0727   01/16/24 1500  vancomycin  (VANCOCIN ) IVPB 1000 mg/200 mL premix        1,000 mg 200 mL/hr over 60 Minutes Intravenous  Once 01/16/24 1449 01/16/24 1618   01/16/24 1245  cefTRIAXone  (ROCEPHIN ) 1 g in sodium chloride  0.9 % 100 mL IVPB        1 g 200 mL/hr over 30 Minutes Intravenous  Once 01/16/24 1239 01/16/24 1436       Subjective: Patient was seen and examined at bedside. Overnight events noted. Patient has baseline dementia,  following simple commands otherwise appears at his baseline. Patient has decreased perfusion in the left lower extremity.  Objective: Vitals:   01/18/24 2101 01/19/24 0447 01/19/24 1219 01/19/24 1224  BP: (!) 104/91 (!) 145/66 100/66   Pulse: (!) 102 96 87 85  Resp: 20 19 19    Temp: 98.3 F (36.8 C) 97.7 F (36.5 C) 98.4 F (36.9 C)   TempSrc:      SpO2: 99% 94%  100%  Weight:      Height:        Intake/Output Summary (Last 24 hours) at 01/19/2024 1340 Last data filed at 01/19/2024 1338 Gross per 24 hour   Intake 1810.13 ml  Output 2300 ml  Net -489.87 ml   Filed Weights   01/16/24 1223 01/16/24 2000  Weight: 62.3 kg 65.6 kg    Examination:  General exam: Appears comfortable, appears deconditioned, not in any acute distress. Respiratory system: CTA Bilaterally. Respiratory effort normal. RR 16 Cardiovascular system: S1 & S2 heard, RRR. No JVD, murmurs, rubs, gallops or clicks. Gastrointestinal system: Abdomen is non distended, soft and non tender. Normal bowel sounds heard. Central nervous system: Alert and oriented x 1. No focal neurological deficits. Extremities: Left hemiparesis.  Necrotic appearing skin on the feet and sacrum Skin: No rashes, lesions or ulcers Psychiatry: Judgement and insight appear normal. Mood & affect appropriate.     Data Reviewed: I have personally reviewed following labs and imaging studies  CBC: Recent Labs  Lab 01/16/24 1244 01/17/24 0416 01/19/24 0404  WBC 15.1* 17.7* 14.2*  NEUTROABS 9.5*  --   --   HGB 9.6* 11.4* 8.6*  HCT 30.0* 37.6* 26.7*  MCV 90.6 94.0 89.9  PLT 284 303 302   Basic Metabolic  Panel: Recent Labs  Lab 01/16/24 1244 01/17/24 0416 01/18/24 0848 01/19/24 0404  NA 139 139  --  140  K 4.2 4.1  --  3.7  CL 105 105  --  110  CO2 25 25  --  19*  GLUCOSE 123* 107*  --  112*  BUN 12 9  --  13  CREATININE 0.84 0.80 0.88 0.89  CALCIUM  8.6* 9.0  --  8.4*  MG  --   --   --  1.8  PHOS  --   --   --  3.6   GFR: Estimated Creatinine Clearance: 74.7 mL/min (by C-G formula based on SCr of 0.89 mg/dL). Liver Function Tests: Recent Labs  Lab 01/16/24 1244  AST 31  ALT 17  ALKPHOS 61  BILITOT 0.6  PROT 7.4  ALBUMIN 2.6*   No results for input(s): LIPASE, AMYLASE in the last 168 hours. No results for input(s): AMMONIA in the last 168 hours. Coagulation Profile: Recent Labs  Lab 01/16/24 1244  INR 1.1   Cardiac Enzymes: No results for input(s): CKTOTAL, CKMB, CKMBINDEX, TROPONINI in the last 168  hours. BNP (last 3 results) No results for input(s): PROBNP in the last 8760 hours. HbA1C: No results for input(s): HGBA1C in the last 72 hours.  CBG: Recent Labs  Lab 01/17/24 1610 01/17/24 1851 01/17/24 2255 01/18/24 2100 01/19/24 1221  GLUCAP 94 108* 152* 99 147*   Lipid Profile: No results for input(s): CHOL, HDL, LDLCALC, TRIG, CHOLHDL, LDLDIRECT in the last 72 hours. Thyroid Function Tests: No results for input(s): TSH, T4TOTAL, FREET4, T3FREE, THYROIDAB in the last 72 hours. Anemia Panel: No results for input(s): VITAMINB12, FOLATE, FERRITIN, TIBC, IRON, RETICCTPCT in the last 72 hours. Sepsis Labs: Recent Labs  Lab 01/16/24 1244  LATICACIDVEN 1.3    Recent Results (from the past 240 hours)  Blood Culture (routine x 2)     Status: None (Preliminary result)   Collection Time: 01/16/24 12:44 PM   Specimen: BLOOD  Result Value Ref Range Status   Specimen Description BLOOD BLOOD RIGHT ARM  Final   Special Requests   Final    BOTTLES DRAWN AEROBIC AND ANAEROBIC Blood Culture results may not be optimal due to an inadequate volume of blood received in culture bottles   Culture   Final    NO GROWTH 2 DAYS Performed at [facility], [address], [place], [place] [address]    Report Status PENDING  Incomplete  Blood Culture (routine x 2)     Status: None (Preliminary result)   Collection Time: 01/16/24 12:55 PM   Specimen: BLOOD  Result Value Ref Range Status   Specimen Description BLOOD LEFT ASSIST CONTROL  Final   Special Requests   Final    BOTTLES DRAWN AEROBIC AND ANAEROBIC Blood Culture adequate volume   Culture   Final    NO GROWTH 2 DAYS Performed at [facility], [address] [place], [place] [address]    Report Status PENDING  Incomplete  Resp panel by RT-PCR (RSV, Flu A&B, Covid) Anterior Nasal Swab     Status: None   Collection Time: 01/16/24  1:07 PM   Specimen: Anterior Nasal Swab  Result Value Ref Range  Status   SARS Coronavirus 2 by RT PCR NEGATIVE NEGATIVE Final    Comment: (NOTE) SARS-CoV-2 target nucleic acids are NOT DETECTED.  The SARS-CoV-2 RNA is generally detectable in upper respiratory specimens during the acute phase of infection. The lowest concentration of SARS-CoV-2 viral  copies this assay can detect is 138 copies/mL. A negative result does not preclude SARS-Cov-2 infection and should not be used as the sole basis for treatment or other patient management decisions. A negative result may occur with  improper specimen collection/handling, submission of specimen other than nasopharyngeal swab, presence of viral mutation(s) within the areas targeted by this assay, and inadequate number of viral copies(<138 copies/mL). A negative result must be combined with clinical observations, patient history, and epidemiological information. The expected result is Negative.  Fact Sheet for Patients:  BloggerCourse.com  Fact Sheet for Healthcare Providers:  SeriousBroker.it  This test is no t yet approved or cleared by the [place]  FDA and  has been authorized for detection and/or diagnosis of SARS-CoV-2 by FDA under an Emergency Use Authorization (EUA). This EUA will remain  in effect (meaning this test can be used) for the duration of the COVID-19 declaration under Section 564(b)(1) of the Act, 21 U.S.C.section 360bbb-3(b)(1), unless the authorization is terminated  or revoked sooner.       Influenza A by PCR NEGATIVE NEGATIVE Final   Influenza B by PCR NEGATIVE NEGATIVE Final    Comment: (NOTE) The Xpert Xpress SARS-CoV-2/FLU/RSV plus assay is intended as an aid in the diagnosis of influenza from Nasopharyngeal swab specimens and should not be used as a sole basis for treatment. Nasal washings and aspirates are unacceptable for Xpert Xpress SARS-CoV-2/FLU/RSV testing.  Fact Sheet for  Patients: BloggerCourse.com  Fact Sheet for Healthcare Providers: SeriousBroker.it  This test is not yet approved or cleared by the [place]  FDA and has been authorized for detection and/or diagnosis of SARS-CoV-2 by FDA under an Emergency Use Authorization (EUA). This EUA will remain in effect (meaning this test can be used) for the duration of the COVID-19 declaration under Section 564(b)(1) of the Act, 21 U.S.C. section 360bbb-3(b)(1), unless the authorization is terminated or revoked.     Resp Syncytial Virus by PCR NEGATIVE NEGATIVE Final    Comment: (NOTE) Fact Sheet for Patients: BloggerCourse.com  Fact Sheet for Healthcare Providers: SeriousBroker.it  This test is not yet approved or cleared by the [place]  FDA and has been authorized for detection and/or diagnosis of SARS-CoV-2 by FDA under an Emergency Use Authorization (EUA). This EUA will remain in effect (meaning this test can be used) for the duration of the COVID-19 declaration under Section 564(b)(1) of the Act, 21 U.S.C. section 360bbb-3(b)(1), unless the authorization is terminated or revoked.  Performed at [facility], [address] [place], [place] [address]   Urine Culture     Status: Abnormal   Collection Time: 01/16/24  1:42 PM   Specimen: Urine, Random  Result Value Ref Range Status   Specimen Description   Final    URINE, RANDOM Performed at [facility], [address], [place], [place] [address]    Special Requests   Final    NONE Reflexed from [id number] Performed at [facility], [address] [place], [place] [address]    Culture (A)  Final    <10,000 COLONIES/mL INSIGNIFICANT GROWTH Performed at [facility], [address] [place], [place] [address]    Report Status 01/17/2024 FINAL  Final  MRSA Next Gen by PCR, Nasal     Status: Abnormal   Collection Time: 01/16/24  7:40  PM   Specimen: Nasal Mucosa; Nasal Swab  Result Value Ref Range Status   MRSA by PCR Next Gen DETECTED (A) NOT DETECTED Final    Comment: RESULT CALLED  TO, READ BACK BY AND VERIFIED WITH: VELMA LINGER, RN AT 2136 01/16/24 BY A. SNYDER (NOTE) The GeneXpert MRSA Assay (FDA approved for NASAL specimens only), is one component of a comprehensive MRSA colonization surveillance program. It is not intended to diagnose MRSA infection nor to guide or monitor treatment for MRSA infections. Test performance is not FDA approved in patients less than 25 years old. Performed at [facility], [address], [place], [place] [address]     Radiology Studies: No results found.  Scheduled Meds:  (feeding supplement) PROSource Plus  30 mL Oral BID BM   aspirin  EC  81 mg Oral Daily   atorvastatin   80 mg Oral QHS   baclofen   5 mg Oral TID   carvedilol   6.25 mg Oral BID WC   Chlorhexidine  Gluconate Cloth  6 each Topical Daily   clopidogrel   75 mg Oral Q breakfast   DULoxetine   30 mg Oral Daily   enoxaparin  (LOVENOX ) injection  40 mg Subcutaneous Q24H   levETIRAcetam   1,500 mg Oral BID   melatonin  6 mg Oral QHS   multivitamin with minerals  1 tablet Oral Daily   mupirocin  ointment   Nasal BID   pantoprazole   40 mg Oral Daily   PARoxetine   40 mg Oral Daily   tamsulosin   0.4 mg Oral Daily   valproic  acid  750 mg Oral BID   Continuous Infusions:  ceFEPime  (MAXIPIME ) IV Stopped (01/19/24 0606)   metronidazole  500 mg (01/19/24 1307)   vancomycin  750 mg (01/19/24 1052)     LOS: 3 days    Time spent: 35 mins    Darcel Dawley, MD [facility]   If 7PM-7AM, please contact night-coverage

## 2024-01-19 NOTE — Progress Notes (Addendum)
  Progress Note    01/19/2024 7:39 AM Hospital Day 2  Subjective:  sleeping but wakes to voice.  Not oriented and falls back to sleep.  Does say yes to still having pain in the left foot.   afebrile  Vitals:   01/18/24 2101 01/19/24 0447  BP: (!) 104/91 (!) 145/66  Pulse: (!) 102 96  Resp: 20 19  Temp: 98.3 F (36.8 C) 97.7 F (36.5 C)  SpO2: 99% 94%    Physical Exam: General:  no distress Lungs:  non labored Extremities:  ulcerations to left foot with contraction of left knee  CBC    Component Value Date/Time   WBC 14.2 (H) 01/19/2024 0404   RBC 2.97 (L) 01/19/2024 0404   HGB 8.6 (L) 01/19/2024 0404   HCT 26.7 (L) 01/19/2024 0404   PLT 302 01/19/2024 0404   MCV 89.9 01/19/2024 0404   MCH 29.0 01/19/2024 0404   MCHC 32.2 01/19/2024 0404   RDW 14.4 01/19/2024 0404   LYMPHSABS 2.8 01/16/2024 1244   MONOABS 2.1 (H) 01/16/2024 1244   EOSABS 0.5 01/16/2024 1244   BASOSABS 0.0 01/16/2024 1244    BMET    Component Value Date/Time   NA 140 01/19/2024 0404   K 3.7 01/19/2024 0404   CL 110 01/19/2024 0404   CO2 19 (L) 01/19/2024 0404   GLUCOSE 112 (H) 01/19/2024 0404   BUN 13 01/19/2024 0404   CREATININE 0.89 01/19/2024 0404   CALCIUM  8.4 (L) 01/19/2024 0404   GFRNONAA >60 01/19/2024 0404    INR    Component Value Date/Time   INR 1.1 01/16/2024 1244     Intake/Output Summary (Last 24 hours) at 01/19/2024 0739 Last data filed at 01/19/2024 0600 Gross per 24 hour  Intake 2149.67 ml  Output 1250 ml  Net 899.67 ml     Assessment/Plan:  68 y.o. male  with history as above now with non viable left foot and occluded stents in the LLE.    Hospital Day 2  -pt is not oriented this morning.  Will d/w Dr. Magda who will need to speak with his legal guardian as he will need left AKA with no revascularization options.    Lucie Apt, PA-C [facility] [phone] 01/19/2024 7:39 AM   I have independently interviewed and examined  patient and agree with PA assessment and plan above.  I attempted multiple times to contact his legal guardian yesterday and I was able to contact the the person listed in his chart but his actual legal guardian is [name] at [phone].  Patient will need left above-knee amputation and we will need consent from her prior to proceeding.  Yalanda Soderman C. Sheree, MD [facility] [place] Office: [phone] Pager: [phone]   Addendum: I have been unable to reach his legal guardian.  Patient likely needs palliative care evaluation.  We will be available for above-knee amputation when consent can be obtained.  Penne Sheree, MD

## 2024-01-19 NOTE — TOC Progression Note (Signed)
 Transition of Care (TOC) - Progression Note    Patient Details  Name: Jacob Mcgrath. MRN: [id number] Date of Birth: [birth year]  [facility] of [facility]) CM/SW Contact  Somer Trotter A [place], LCSW Phone Number: 01/19/2024, 2:24 PM  Clinical Narrative:     CSW contacted  pt's legal guardian Sidra Gaskins [phone]) to get confirmation of pt being long term care at [facility].   She confirmed pt is from facility, stated pt has been there for about a year and is a long term care pt.   CSW informed her of pt's brief medical course, informed her the medical team wanting to reach out so that they can get permission for recommendation for surgery. She said that she was available to discuss and assist however needed. CSW notified pt's medical team of availability.     TOC will continue to follow.   Expected Discharge Plan: Skilled Nursing Facility Barriers to Discharge: Continued Medical Work up  Expected Discharge Plan and Services     Post Acute Care Choice: Skilled Nursing Facility Living arrangements for the past 2 months: Skilled Nursing Facility                                       Social Determinants of Health (SDOH) Interventions SDOH Screenings   Food Insecurity: Patient Unable To Answer (01/16/2024)  Housing: Patient Unable To Answer (01/16/2024)  Transportation Needs: Patient Unable To Answer (01/16/2024)  Utilities: Patient Unable To Answer (01/16/2024)  Social Connections: Patient Unable To Answer (01/16/2024)  Tobacco Use: High Risk (01/16/2024)    Readmission Risk Interventions    01/17/2024   10:20 AM 08/01/2023    3:31 PM 03/01/2023    2:40 PM  Readmission Risk Prevention Plan  Transportation Screening Complete Complete Complete  PCP or Specialist Appt within 5-7 Days  Complete Not Complete  Home Care Screening  Complete Complete  Medication Review (RN CM)  Complete Complete  HRI or Home Care Consult Complete    Social Work Consult for Recovery  Care Planning/Counseling Complete    Palliative Care Screening Not Applicable    Medication Review Oceanographer) Complete

## 2024-01-20 DIAGNOSIS — Z515 Encounter for palliative care: Secondary | ICD-10-CM

## 2024-01-20 DIAGNOSIS — Z7189 Other specified counseling: Secondary | ICD-10-CM

## 2024-01-20 DIAGNOSIS — R6510 Systemic inflammatory response syndrome (SIRS) of non-infectious origin without acute organ dysfunction: Secondary | ICD-10-CM | POA: Diagnosis not present

## 2024-01-20 LAB — CREATININE, SERUM
Creatinine, Ser: 0.93 mg/dL (ref 0.61–1.24)
GFR, Estimated: >60 mL/min (ref >60)

## 2024-01-20 LAB — CBC
HCT: 25.3 % — ABNORMAL LOW (ref 39.0–52.0)
Hemoglobin: 8 g/dL — ABNORMAL LOW (ref 13.0–17.0)
MCH: 28.9 pg (ref 26.0–34.0)
MCHC: 31.6 g/dL (ref 30.0–36.0)
MCV: 91.3 fL (ref 80.0–100.0)
Platelets: 330 10*3/uL (ref 150–400)
RBC: 2.77 MIL/uL — ABNORMAL LOW (ref 4.22–5.81)
RDW: 14.6 % (ref 11.5–15.5)
WBC: 13.7 10*3/uL — ABNORMAL HIGH (ref 4.0–10.5)
nRBC: 0 % (ref 0.0–0.2)

## 2024-01-20 LAB — GLUCOSE, CAPILLARY
Glucose-Capillary: 135 mg/dL — ABNORMAL HIGH (ref 70–99)
Glucose-Capillary: 122 mg/dL — ABNORMAL HIGH (ref 70–99)
Glucose-Capillary: 100 mg/dL — ABNORMAL HIGH (ref 70–99)

## 2024-01-20 NOTE — Progress Notes (Signed)
 PROGRESS NOTE    Jacob Mcgrath.  FMW:[id number] DOB: [birth year] DOA: 01/16/2024 PCP: [name], MD   Brief Narrative:  This 68 yrs old male with medical history significant for dementia, type 2 diabetes mellitus, hypertension, PAD s/p stenting, epilepsy with history of status epilepticus, depression, history of stroke.  Patient was brought to the ED from [facility] long term care with report of fevers, tremors that started 6/23.  He apparently had been on a 14 day course of ertapenem at SNF for diabetic foot infection/ulcer.  Facility reports fever of 103 this morning, He was given Tylenol  prior to arrival.  They note necrotic appearing areas on feet.  Workup in the ED reveals Left foot xray  > No evidence of osteomyelitis.  CT angio shows occluded vasculature.  Patient was initiated on IV antibiotics.  Patient was admitted for further evaluation.  General Surgery and vascular surgery is consulted.  Assessment & Plan:   Principal Problem:   SIRS (systemic inflammatory response syndrome) (HCC) Active Problems:   Stroke (HCC)   Acute metabolic encephalopathy   Decubitus ulcers   PAD (peripheral artery disease) (HCC)   Controlled type 2 diabetes mellitus without complication, without long-term current use of insulin  (HCC)   Depression   Dementia without behavioral disturbance (HCC)   Essential hypertension   Seizure (HCC)   Ulcer of left foot (HCC)   Sepsis from LLE cellulitis : Patient presented with sepsis criteria (tachycardia, leukocytosis, tachypnea, normal lactic acid, source) Nursing home reports fever- 103, he was given Tylenol  prior to arrival.   He has chronic  left lower extremity wounds which may be etiology of infection.   Left foot x-ray-no evidence of osteomyelitis.   UA with trace leukocytes not quite convincing as etiology.  Continue IV fluid resuscitation. Continue broad-spectrum antibiotics ( IV vancomycin , cefepime  and metronidazole ) Blood  cultures no growth, urine culture insignificant growth. Sepsis physiology improving.   PAD (peripheral artery disease) - severe : At this time No suggestion of ischemia. Follows with vascular surgeon Dr. Magda -last visit 04/2023.  CT angio bifemoral- Occluded left SFA stent extending into the proximal popliteal artery.  Occluded left posterior tibial artery to the mid calf. Patent left external iliac artery stent. Short-segment high-grade stenosis mid right external iliac artery. Resume Plavix  General Surgeon Dr. Evonnie, recommended  inpatient vascular surgery consultation,  Patient was evaluated by Dr. Sheree recommended left above-knee amputation. Vascular surgery is unable to reach legal guardian about decision.  Palliative care consulted to discuss goals of care. Patient needs above-knee amputation given significant PAD.   Decubitus ulcers: Left lower extremity decubitus ulcers-on lateral side.  Unstageable.  Black eschar present.  Possible cellulitis, but due to hyperpigmentation hard to tell, but no fluctuance or swelling appreciated.  Left foot x-ray- No acute abnormality. Continue Broad-spectrum antibiotics for now Per MAR-he had been on a 14 day course of  IV Ertepenem for diabetic foot ulcer.   Essential hypertension: Blood pressure soft but improving after hydration. Resume carvedilol  6.25 mg twice daily with hold parameters.   Acute metabolic encephalopathy: Now more awake. Baseline dementia.   Unable to determine severity of patient's baseline dementia.    Continue IV antibiotics, IV fluids.   Dementia without behavioral disturbance ; Delirium precautions He needs goals of care Discussion.   Epilepsy: Resumed oral Keppra .   Controlled type 2 diabetes mellitus: Hypoglycemia .  - HgbA1c - 5.9%  - DC SSI coverage - he doesn't need it - try to avoid  hypoglycemia.   DVT prophylaxis: Lovenox  Code Status: Full code Family Communication: No family at  bedside. Disposition Plan:    Status is: Inpatient Remains inpatient appropriate because: Inpatient  Admitted for decreased left lower extremity perfusion,  vascular surgery recommended above-knee amputation. Palliative care consulted to discuss goals of care.  Consultants:  Surgery Vascular surgery  Procedures: CT Angio  Antimicrobials:  Anti-infectives (From admission, onward)    Start     Dose/Rate Route Frequency Ordered Stop   01/17/24 0800  vancomycin  (VANCOREADY) IVPB 750 mg/150 mL        750 mg 150 mL/hr over 60 Minutes Intravenous Every 12 hours 01/16/24 2112     01/16/24 2200  metroNIDAZOLE  (FLAGYL ) IVPB 500 mg        500 mg 100 mL/hr over 60 Minutes Intravenous Every 12 hours 01/16/24 2104     01/16/24 2200  ceFEPIme  (MAXIPIME ) 2 g in sodium chloride  0.9 % 100 mL IVPB        2 g 200 mL/hr over 30 Minutes Intravenous Every 8 hours 01/16/24 2107     01/16/24 2200  vancomycin  (VANCOREADY) IVPB 500 mg/100 mL        500 mg 100 mL/hr over 60 Minutes Intravenous  Once 01/16/24 2108 01/17/24 0727   01/16/24 1500  vancomycin  (VANCOCIN ) IVPB 1000 mg/200 mL premix        1,000 mg 200 mL/hr over 60 Minutes Intravenous  Once 01/16/24 1449 01/16/24 1618   01/16/24 1245  cefTRIAXone  (ROCEPHIN ) 1 g in sodium chloride  0.9 % 100 mL IVPB        1 g 200 mL/hr over 30 Minutes Intravenous  Once 01/16/24 1239 01/16/24 1436       Subjective: Patient was seen and examined at bedside. Overnight events noted. Patient has baseline dementia, he follows simple commands otherwise appears at his baseline. Patient has decreased perfusion in the left lower extremity.  Objective: Vitals:   01/20/24 0116 01/20/24 0430 01/20/24 0820 01/20/24 1226  BP: (!) 108/59 106/60 (!) 101/55 128/63  Pulse: 85 89 81 86  Resp: 17 19 19 19   Temp: 97.8 F (36.6 C) 98.2 F (36.8 C) 98.1 F (36.7 C) 98.2 F (36.8 C)  TempSrc:      SpO2: 97% 100% 99% 100%  Weight:      Height:        Intake/Output  Summary (Last 24 hours) at 01/20/2024 1326 Last data filed at 01/20/2024 0600 Gross per 24 hour  Intake 1828.51 ml  Output 1200 ml  Net 628.51 ml   Filed Weights   01/16/24 1223 01/16/24 2000  Weight: 62.3 kg 65.6 kg    Examination:  General exam: Appears comfortable, appears deconditioned, not in any acute distress. Respiratory system: CTA Bilaterally. Respiratory effort normal. RR 14 Cardiovascular system: S1 & S2 heard, RRR. No JVD, murmurs, rubs, gallops or clicks. Gastrointestinal system: Abdomen is non distended, soft and non tender. Normal bowel sounds heard. Central nervous system: Alert and oriented x 1. No focal neurological deficits. Extremities: Left hemiparesis.  Necrotic appearing skin on the feet and sacrum Skin: No rashes, lesions or ulcers Psychiatry: Judgement and insight appear normal. Mood & affect appropriate.     Data Reviewed: I have personally reviewed following labs and imaging studies  CBC: Recent Labs  Lab 01/16/24 1244 01/17/24 0416 01/19/24 0404 01/20/24 0526  WBC 15.1* 17.7* 14.2* 13.7*  NEUTROABS 9.5*  --   --   --   HGB 9.6* 11.4* 8.6* 8.0*  HCT 30.0* 37.6* 26.7* 25.3*  MCV 90.6 94.0 89.9 91.3  PLT 284 303 302 330   Basic Metabolic Panel: Recent Labs  Lab 01/16/24 1244 01/17/24 0416 01/18/24 0848 01/19/24 0404 01/20/24 0526  NA 139 139  --  140  --   K 4.2 4.1  --  3.7  --   CL 105 105  --  110  --   CO2 25 25  --  19*  --   GLUCOSE 123* 107*  --  112*  --   BUN 12 9  --  13  --   CREATININE 0.84 0.80 0.88 0.89 0.93  CALCIUM  8.6* 9.0  --  8.4*  --   MG  --   --   --  1.8  --   PHOS  --   --   --  3.6  --    GFR: Estimated Creatinine Clearance: 71.5 mL/min (by C-G formula based on SCr of 0.93 mg/dL). Liver Function Tests: Recent Labs  Lab 01/16/24 1244  AST 31  ALT 17  ALKPHOS 61  BILITOT 0.6  PROT 7.4  ALBUMIN 2.6*   No results for input(s): LIPASE, AMYLASE in the last 168 hours. No results for input(s):  AMMONIA in the last 168 hours. Coagulation Profile: Recent Labs  Lab 01/16/24 1244  INR 1.1   Cardiac Enzymes: No results for input(s): CKTOTAL, CKMB, CKMBINDEX, TROPONINI in the last 168 hours. BNP (last 3 results) No results for input(s): PROBNP in the last 8760 hours. HbA1C: No results for input(s): HGBA1C in the last 72 hours.  CBG: Recent Labs  Lab 01/19/24 1221 01/19/24 1649 01/19/24 2013 01/20/24 0934 01/20/24 1226  GLUCAP 147* 85 125* 135* 122*   Lipid Profile: No results for input(s): CHOL, HDL, LDLCALC, TRIG, CHOLHDL, LDLDIRECT in the last 72 hours. Thyroid Function Tests: No results for input(s): TSH, T4TOTAL, FREET4, T3FREE, THYROIDAB in the last 72 hours. Anemia Panel: No results for input(s): VITAMINB12, FOLATE, FERRITIN, TIBC, IRON, RETICCTPCT in the last 72 hours. Sepsis Labs: Recent Labs  Lab 01/16/24 1244  LATICACIDVEN 1.3    Recent Results (from the past 240 hours)  Blood Culture (routine x 2)     Status: None (Preliminary result)   Collection Time: 01/16/24 12:44 PM   Specimen: BLOOD  Result Value Ref Range Status   Specimen Description BLOOD BLOOD RIGHT ARM  Final   Special Requests   Final    BOTTLES DRAWN AEROBIC AND ANAEROBIC Blood Culture results may not be optimal due to an inadequate volume of blood received in culture bottles   Culture   Final    NO GROWTH 4 DAYS Performed at [facility], [address] [place], [place] [address]    Report Status PENDING  Incomplete  Blood Culture (routine x 2)     Status: None (Preliminary result)   Collection Time: 01/16/24 12:55 PM   Specimen: BLOOD  Result Value Ref Range Status   Specimen Description BLOOD LEFT ASSIST CONTROL  Final   Special Requests   Final    BOTTLES DRAWN AEROBIC AND ANAEROBIC Blood Culture adequate volume   Culture   Final    NO GROWTH 4 DAYS Performed at [facility], [address] [place], [place] [address]    Report  Status PENDING  Incomplete  Resp panel by RT-PCR (RSV, Flu A&B, Covid) Anterior Nasal Swab     Status: None   Collection Time: 01/16/24  1:07 PM   Specimen: Anterior Nasal Swab  Result Value Ref Range Status   SARS Coronavirus 2 by RT PCR NEGATIVE NEGATIVE Final    Comment: (NOTE) SARS-CoV-2 target nucleic acids are NOT DETECTED.  The SARS-CoV-2 RNA is generally detectable in upper respiratory specimens during the acute phase of infection. The lowest concentration of SARS-CoV-2 viral copies this assay can detect is 138 copies/mL. A negative result does not preclude SARS-Cov-2 infection and should not be used as the sole basis for treatment or other patient management decisions. A negative result may occur with  improper specimen collection/handling, submission of specimen other than nasopharyngeal swab, presence of viral mutation(s) within the areas targeted by this assay, and inadequate number of viral copies(<138 copies/mL). A negative result must be combined with clinical observations, patient history, and epidemiological information. The expected result is Negative.  Fact Sheet for Patients:  BloggerCourse.com  Fact Sheet for Healthcare Providers:  SeriousBroker.it  This test is no t yet approved or cleared by the [place]  FDA and  has been authorized for detection and/or diagnosis of SARS-CoV-2 by FDA under an Emergency Use Authorization (EUA). This EUA will remain  in effect (meaning this test can be used) for the duration of the COVID-19 declaration under Section 564(b)(1) of the Act, 21 U.S.C.section 360bbb-3(b)(1), unless the authorization is terminated  or revoked sooner.       Influenza A by PCR NEGATIVE NEGATIVE Final   Influenza B by PCR NEGATIVE NEGATIVE Final    Comment: (NOTE) The Xpert Xpress SARS-CoV-2/FLU/RSV plus assay is intended as an aid in the diagnosis of influenza from Nasopharyngeal swab  specimens and should not be used as a sole basis for treatment. Nasal washings and aspirates are unacceptable for Xpert Xpress SARS-CoV-2/FLU/RSV testing.  Fact Sheet for Patients: BloggerCourse.com  Fact Sheet for Healthcare Providers: SeriousBroker.it  This test is not yet approved or cleared by the [place]  FDA and has been authorized for detection and/or diagnosis of SARS-CoV-2 by FDA under an Emergency Use Authorization (EUA). This EUA will remain in effect (meaning this test can be used) for the duration of the COVID-19 declaration under Section 564(b)(1) of the Act, 21 U.S.C. section 360bbb-3(b)(1), unless the authorization is terminated or revoked.     Resp Syncytial Virus by PCR NEGATIVE NEGATIVE Final    Comment: (NOTE) Fact Sheet for Patients: BloggerCourse.com  Fact Sheet for Healthcare Providers: SeriousBroker.it  This test is not yet approved or cleared by the [place]  FDA and has been authorized for detection and/or diagnosis of SARS-CoV-2 by FDA under an Emergency Use Authorization (EUA). This EUA will remain in effect (meaning this test can be used) for the duration of the COVID-19 declaration under Section 564(b)(1) of the Act, 21 U.S.C. section 360bbb-3(b)(1), unless the authorization is terminated or revoked.  Performed at [facility], [address], [place], [place] [address]   Urine Culture     Status: Abnormal   Collection Time: 01/16/24  1:42 PM   Specimen: Urine, Random  Result Value Ref Range Status   Specimen Description   Final    URINE, RANDOM Performed at [facility], [address], [place], [place] [address]    Special Requests   Final    NONE Reflexed from [id number] Performed at [facility], [address] [place], [place] [address]    Culture (A)  Final    <10,000 COLONIES/mL INSIGNIFICANT GROWTH Performed at [facility], [address] [place], [place] [address]    Report Status 01/17/2024 FINAL  Final  MRSA Next Gen by PCR, Nasal     Status: Abnormal   Collection Time: 01/16/24  7:40 PM   Specimen: Nasal Mucosa; Nasal Swab  Result Value Ref Range Status   MRSA by PCR Next Gen DETECTED (A) NOT DETECTED Final    Comment: RESULT CALLED TO, READ BACK BY AND VERIFIED WITH: VELMA LINGER, RN AT 2136 01/16/24 BY A. SNYDER (NOTE) The GeneXpert MRSA Assay (FDA approved for NASAL specimens only), is one component of a comprehensive MRSA colonization surveillance program. It is not intended to diagnose MRSA infection nor to guide or monitor treatment for MRSA infections. Test performance is not FDA approved in patients less than 14 years old. Performed at [facility], [address], [place], [place] [address]     Radiology Studies: No results found.  Scheduled Meds:  (feeding supplement) PROSource Plus  30 mL Oral BID BM   aspirin  EC  81 mg Oral Daily   atorvastatin   80 mg Oral QHS   baclofen   5 mg Oral TID   carvedilol   6.25 mg Oral BID WC   Chlorhexidine  Gluconate Cloth  6 each Topical Daily   clopidogrel   75 mg Oral Q breakfast   DULoxetine   30 mg Oral Daily   enoxaparin  (LOVENOX ) injection  40 mg Subcutaneous Q24H   levETIRAcetam   1,500 mg Oral BID   melatonin  6 mg Oral QHS   multivitamin with minerals  1 tablet Oral Daily   mupirocin  ointment   Nasal BID   pantoprazole   40 mg Oral Daily   PARoxetine   40 mg Oral Daily   tamsulosin   0.4 mg Oral Daily   valproic  acid  750 mg Oral BID   Continuous Infusions:  ceFEPime  (MAXIPIME ) IV 2 g (01/20/24 0521)   metronidazole  500 mg (01/20/24 1228)   vancomycin  750 mg (01/20/24 1053)     LOS: 4 days    Time spent: 35 mins    Darcel Dawley, MD [facility]   If 7PM-7AM, please contact night-coverage

## 2024-01-20 NOTE — Care Management Important Message (Signed)
 Important Message  Patient Details  Name: Jacob Mcgrath. MRN: [id number] Date of Birth: [birth year]   Important Message Given:  Yes - Medicare IM     [name] 01/20/2024, 9:01 AM

## 2024-01-20 NOTE — Plan of Care (Signed)
  Problem: Education: Goal: Knowledge of General Education information will improve Description: Including pain rating scale, medication(s)/side effects and non-pharmacologic comfort measures Outcome: Progressing   Problem: Health Behavior/Discharge Planning: Goal: Ability to manage health-related needs will improve Outcome: Progressing   Problem: Clinical Measurements: Goal: Ability to maintain clinical measurements within normal limits will improve Outcome: Progressing Goal: Will remain free from infection Outcome: Progressing Goal: Diagnostic test results will improve Outcome: Progressing Goal: Respiratory complications will improve Outcome: Progressing Goal: Cardiovascular complication will be avoided Outcome: Progressing   Problem: Activity: Goal: Risk for activity intolerance will decrease Outcome: Progressing   Problem: Nutrition: Goal: Adequate nutrition will be maintained Outcome: Progressing   Problem: Coping: Goal: Level of anxiety will decrease Outcome: Progressing   Problem: Elimination: Goal: Will not experience complications related to bowel motility Outcome: Progressing Goal: Will not experience complications related to urinary retention Outcome: Progressing   Problem: Pain Managment: Goal: General experience of comfort will improve and/or be controlled Outcome: Progressing   Problem: Safety: Goal: Ability to remain free from injury will improve Outcome: Progressing   Problem: Skin Integrity: Goal: Risk for impaired skin integrity will decrease Outcome: Progressing   Problem: Education: Goal: Ability to describe self-care measures that may prevent or decrease complications (Diabetes Survival Skills Education) will improve Outcome: Progressing Goal: Individualized Educational Video(s) Outcome: Progressing   Problem: Coping: Goal: Ability to adjust to condition or change in health will improve Outcome: Progressing   Problem: Fluid  Volume: Goal: Ability to maintain a balanced intake and output will improve Outcome: Progressing   Problem: Health Behavior/Discharge Planning: Goal: Ability to identify and utilize available resources and services will improve Outcome: Progressing Goal: Ability to manage health-related needs will improve Outcome: Progressing   Problem: Metabolic: Goal: Ability to maintain appropriate glucose levels will improve Outcome: Progressing   Problem: Nutritional: Goal: Maintenance of adequate nutrition will improve Outcome: Progressing Goal: Progress toward achieving an optimal weight will improve Outcome: Progressing   Problem: Skin Integrity: Goal: Risk for impaired skin integrity will decrease Outcome: Progressing   Problem: Tissue Perfusion: Goal: Adequacy of tissue perfusion will improve Outcome: Progressing   Problem: Education: Goal: Knowledge of General Education information will improve Description: Including pain rating scale, medication(s)/side effects and non-pharmacologic comfort measures Outcome: Progressing   Problem: Health Behavior/Discharge Planning: Goal: Ability to manage health-related needs will improve Outcome: Progressing   Problem: Clinical Measurements: Goal: Ability to maintain clinical measurements within normal limits will improve Outcome: Progressing Goal: Will remain free from infection Outcome: Progressing Goal: Diagnostic test results will improve Outcome: Progressing Goal: Respiratory complications will improve Outcome: Progressing Goal: Cardiovascular complication will be avoided Outcome: Progressing   Problem: Activity: Goal: Risk for activity intolerance will decrease Outcome: Progressing   Problem: Nutrition: Goal: Adequate nutrition will be maintained Outcome: Progressing   Problem: Coping: Goal: Level of anxiety will decrease Outcome: Progressing   Problem: Elimination: Goal: Will not experience complications related to  bowel motility Outcome: Progressing Goal: Will not experience complications related to urinary retention Outcome: Progressing   Problem: Pain Managment: Goal: General experience of comfort will improve and/or be controlled Outcome: Progressing   Problem: Safety: Goal: Ability to remain free from injury will improve Outcome: Progressing   Problem: Skin Integrity: Goal: Risk for impaired skin integrity will decrease Outcome: Progressing

## 2024-01-20 NOTE — Consult Note (Signed)
 Palliative Care Consult Note                                  Date: 01/20/2024   Patient Name: Jacob Mcgrath.  DOB: May 26, 1956  MRN: [id number]  Age / Sex: 68 y.o., male  PCP: [name], MD Referring Physician: Leotis Bogus, MD  Reason for Consultation: Establishing goals of care  HPI/Patient Profile: Palliative Care consult requested for goals of care discussion in this 68 y.o. male  with past medical history of  diabetes, hypertension, CVA, dementia, depression, status epilepticus. Recent hospitalization on 5/15-5/23 for status epilepticus requiring intubation for seizure control. He was admitted on 01/16/2024 via EMS from [facility] with fever and tremors. Tmax morning of arrival was 103. Patient given Tylenol  per facility.  Patient recently on 14 day antibiotic for diabetic foot ulceration. During work-up noted to have necrotic areas on feet. Imaging negative for osteomyelitis. Started on IV antibiotics. 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   Past Medical History:  Diagnosis Date   Dementia (HCC)    Diabetes mellitus without complication (HCC)    Epilepsy    Hypertension    PAD (peripheral artery disease) (HCC)    Stroke (HCC)      Subjective:   This NP Levon Freud reviewed medical records, received report from team, assessed the patient and then met at the patient's bedside with Mr. Turnage to discuss diagnosis, prognosis, GOC, EOL wishes disposition and options.  Patient has history of dementia. Follows simple commands. He is able to answer most questions appropriately however due to dementia and legal guardian in place did not engage in goals of discussions. I was able to connect with his legal guardian, [name] with [employer] by phone.    Concept of Palliative Care was introduced as specialized medical care for people and their families living  with serious illness.  It focuses on providing relief from the symptoms and stress of a serious illness.  The goal is to improve quality of life for both the patient and the family. Values and goals of care important to patient and family were attempted to be elicited.  I created space and opportunity for patient and guardian to explore state of health prior to admission, thoughts, and feelings.   Mrs. Gaskins shares [employer] guardianship has been involved in his care for about three years and has limited contact with his daughter, who lives in [place]. The guardian has spoken to her only two or three times over the past year.  Mr. Rock shares with me that he has two daughters who both live in the [place] area with his ex-wife. Patient states he served for 5 years in the [place]  [employer] sharing his [employer] name and identification number. 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 Enjoys football and basketball.   We discussed His current illness and what it means in the larger context of His on-going co-morbidities. Natural disease trajectory and expectations were discussed.  Mrs. Gaskins verbalized understanding of current illness and co-morbidities. She is realistic in her understanding expressing protocol to discuss with the medical team in conference for a unified decision when it comes to major medical decisions. Guardian expressed this would most likely happen on Monday no later than Tuesday. I also addressed patient's current full code status with consideration of  his current illness and co-morbidities. She verbalized understanding. Ileana expresses plans to present Mr. Wendling case next week with a goal of notifying medical team of final decisions regarding proceed or decline AKA and any change in code status.   At this time her goals remain clear for patient to remain a full code and continue to treat the treatable allowing Mr. Spicher  every opportunity to continue to improve.   I discussed the importance of continued conversation with family and their medical providers regarding overall plan of care and treatment options, ensuring decisions are within the context of the patients values and GOCs.  Questions and concerns were addressed. The family was encouraged to call with questions or concerns.  PMT will continue to support holistically as needed.  Objective:   Primary Diagnoses: Present on Admission:  SIRS (systemic inflammatory response syndrome) (HCC)  Acute metabolic encephalopathy  Dementia without behavioral disturbance (HCC)  Depression  Essential hypertension  Stroke (HCC)  Decubitus ulcers  PAD (peripheral artery disease) (HCC)  Ulcer of left foot (HCC)   Scheduled Meds:  (feeding supplement) PROSource Plus  30 mL Oral BID BM   aspirin  EC  81 mg Oral Daily   atorvastatin   80 mg Oral QHS   baclofen   5 mg Oral TID   carvedilol   6.25 mg Oral BID WC   Chlorhexidine  Gluconate Cloth  6 each Topical Daily   clopidogrel   75 mg Oral Q breakfast   DULoxetine   30 mg Oral Daily   enoxaparin  (LOVENOX ) injection  40 mg Subcutaneous Q24H   levETIRAcetam   1,500 mg Oral BID   melatonin  6 mg Oral QHS   multivitamin with minerals  1 tablet Oral Daily   mupirocin  ointment   Nasal BID   pantoprazole   40 mg Oral Daily   PARoxetine   40 mg Oral Daily   tamsulosin   0.4 mg Oral Daily   valproic  acid  750 mg Oral BID    Continuous Infusions:  ceFEPime  (MAXIPIME ) IV 2 g (01/20/24 0521)   metronidazole  500 mg (01/19/24 2120)   vancomycin  750 mg (01/19/24 1947)    PRN Meds: acetaminophen  **OR** acetaminophen , ondansetron  **OR** ondansetron  (ZOFRAN ) IV, polyethylene glycol  Allergies  Allergen Reactions   Amoxicillin Other (See Comments)    Unknown reaction per facility   Ampicillin Other (See Comments)    Unknown reaction per facility   Tetracyclines & Related Rash    Review of Systems Unless otherwise  noted, a complete review of systems is negative.  Physical Exam General: NAD, frail chronically-ill appearing Cardiovascular: regular rate and rhythm Pulmonary: clear ant fields, diminished bilaterally  Abdomen: soft, nontender, + bowel sounds Extremities: no edema, no joint deformities Skin: no rashes, warm and dry Neurological:   Vital Signs:  BP (!) 101/55 (BP Location: Right Arm)   Pulse 81   Temp 98.1 F (36.7 C)   Resp 19   Ht 5' 9 (1.753 m)   Wt 65.6 kg   SpO2 99%   BMI 21.36 kg/m  Pain Scale: 0-10 POSS *See Group Information*: S-Acceptable,Sleep, easy to arouse Pain Score: 0-No pain  SpO2: SpO2: 99 % O2 Device:SpO2: 99 % O2 Flow Rate: .   IO: Intake/output summary:  Intake/Output Summary (Last 24 hours) at 01/20/2024 0856 Last data filed at 01/20/2024 0600 Gross per 24 hour  Intake 1948.51 ml  Output 1850 ml  Net 98.51 ml    LBM:   Baseline Weight: Weight: 62.3 kg Most recent weight: Weight: 65.6 kg  Palliative Assessment/Data: PPS 20-30%   Advanced Care Planning:   Primary Decision Maker: LEGAL GUARDIAN [name] with [employer]   Code Status/Advance Care Planning: Full code  Assessment & Plan:   SUMMARY OF RECOMMENDATIONS   Full Code-as confirmed by Legal Guardian. Will discuss with team for final decisions on code status. Will require 2 provider recommendation and form completion if voted to change status to DNR.  Continue with current plan of care. Full Scope. Meeting with [facility] leadership team to determine ok to proceed with AKA or not. Guardian states should have a decision no later than Tuesday afternoon.  PMT will continue to support and follow as needed. Please call team line with any urgent unmet palliative needs.  Symptom Management:  Per Attending  Palliative Prophylaxis:  Bowel Regimen, Delirium Protocol, Frequent Pain Assessment, Palliative Wound Care, and Turn Reposition  Additional Recommendations (Limitations, Scope,  Preferences): Full Scope Treatment  Psycho-social/Spiritual:  Desire for further Chaplaincy support: no Additional Recommendations: ongoing goals of care discussions   Prognosis:  Unable to determine  Discharge Planning:  To Be Determined per guardian goal is to return back to LTC facility [facility]   Patient's Legal Guardian, Ileana expressed understanding and was in agreement with this plan.    Time Total:  Visit consisted of counseling and education dealing with the complex and emotionally intense issues of symptom management and palliative care in the setting of serious and potentially life-threatening illness.  Signed by:  Levon Borer, AGPCNP-BC [facility]   Phone: [phone] Pager: [phone] Amion: GEANNIE Freud   Thank you for allowing the Palliative Medicine Team to assist in the care of this patient. Please utilize secure chat with additional questions, if there is no response within 30 minutes please call the above phone number. Palliative Medicine Team providers are available by phone from 7am to 5pm daily and can be reached through the team cell phone.  Should this patient require assistance outside of these hours, please call the patient's attending physician.  *Please note that this is a verbal dictation therefore any spelling or grammatical errors are due to the Dragon Medical One system interpretation.

## 2024-01-20 NOTE — Plan of Care (Signed)
  Problem: Clinical Measurements: Goal: Ability to maintain clinical measurements within normal limits will improve Outcome: Progressing Goal: Will remain free from infection Outcome: Progressing Goal: Diagnostic test results will improve Outcome: Progressing Goal: Respiratory complications will improve Outcome: Progressing Goal: Cardiovascular complication will be avoided Outcome: Progressing   Problem: Activity: Goal: Risk for activity intolerance will decrease Outcome: Progressing   Problem: Nutrition: Goal: Adequate nutrition will be maintained Outcome: Progressing   Problem: Coping: Goal: Level of anxiety will decrease Outcome: Progressing   Problem: Elimination: Goal: Will not experience complications related to bowel motility Outcome: Progressing Goal: Will not experience complications related to urinary retention Outcome: Progressing   Problem: Pain Managment: Goal: General experience of comfort will improve and/or be controlled Outcome: Progressing   Problem: Safety: Goal: Ability to remain free from injury will improve Outcome: Progressing   Problem: Skin Integrity: Goal: Risk for impaired skin integrity will decrease Outcome: Progressing   Problem: Coping: Goal: Ability to adjust to condition or change in health will improve Outcome: Progressing   Problem: Fluid Volume: Goal: Ability to maintain a balanced intake and output will improve Outcome: Progressing   Problem: Metabolic: Goal: Ability to maintain appropriate glucose levels will improve Outcome: Progressing   Problem: Clinical Measurements: Goal: Ability to maintain clinical measurements within normal limits will improve Outcome: Progressing Goal: Will remain free from infection Outcome: Progressing Goal: Diagnostic test results will improve Outcome: Progressing Goal: Respiratory complications will improve Outcome: Progressing Goal: Cardiovascular complication will be avoided Outcome:  Progressing   Problem: Activity: Goal: Risk for activity intolerance will decrease Outcome: Progressing   Problem: Coping: Goal: Level of anxiety will decrease Outcome: Progressing   Problem: Elimination: Goal: Will not experience complications related to bowel motility Outcome: Progressing Goal: Will not experience complications related to urinary retention Outcome: Progressing   Problem: Pain Managment: Goal: General experience of comfort will improve and/or be controlled Outcome: Progressing   Problem: Skin Integrity: Goal: Risk for impaired skin integrity will decrease Outcome: Progressing   Problem: Education: Goal: Knowledge of General Education information will improve Description: Including pain rating scale, medication(s)/side effects and non-pharmacologic comfort measures Outcome: Not Progressing   Problem: Health Behavior/Discharge Planning: Goal: Ability to manage health-related needs will improve Outcome: Not Progressing   Problem: Education: Goal: Ability to describe self-care measures that may prevent or decrease complications (Diabetes Survival Skills Education) will improve Outcome: Not Progressing Goal: Individualized Educational Video(s) Outcome: Not Progressing   Problem: Health Behavior/Discharge Planning: Goal: Ability to identify and utilize available resources and services will improve Outcome: Not Progressing Goal: Ability to manage health-related needs will improve Outcome: Not Progressing   Problem: Nutritional: Goal: Maintenance of adequate nutrition will improve Outcome: Not Progressing Goal: Progress toward achieving an optimal weight will improve Outcome: Not Progressing   Problem: Skin Integrity: Goal: Risk for impaired skin integrity will decrease Outcome: Not Progressing   Problem: Tissue Perfusion: Goal: Adequacy of tissue perfusion will improve Outcome: Not Progressing   Problem: Education: Goal: Knowledge of General  Education information will improve Description: Including pain rating scale, medication(s)/side effects and non-pharmacologic comfort measures Outcome: Not Progressing   Problem: Health Behavior/Discharge Planning: Goal: Ability to manage health-related needs will improve Outcome: Not Progressing   Problem: Nutrition: Goal: Adequate nutrition will be maintained Outcome: Not Progressing   Problem: Safety: Goal: Ability to remain free from injury will improve Outcome: Not Progressing

## 2024-01-20 NOTE — Consult Note (Signed)
 Pharmacy Antibiotic Note  Jacob Mcgrath. is a 68 y.o. male admitted on 01/16/2024 with sepsis.  Pharmacy has been consulted for cefepime  and vancomycin  dosing.  Plan: Continue Vancomycin  750 mg iv Q 12 Continue Cefepime  2 grams iv Q 8 hours Follow up plan  Height: 5' 9 (175.3 cm) Weight: 65.6 kg (144 lb 10 oz) IBW/kg (Calculated) : 70.7  Temp (24hrs), Avg:98.2 F (36.8 C), Min:97.8 F (36.6 C), Max:98.7 F (37.1 C)  Recent Labs  Lab 01/16/24 1244 01/17/24 0416 01/18/24 0848 01/19/24 0404 01/20/24 0526  WBC 15.1* 17.7*  --  14.2* 13.7*  CREATININE 0.84 0.80 0.88 0.89 0.93  LATICACIDVEN 1.3  --   --   --   --     Estimated Creatinine Clearance: 71.5 mL/min (by C-G formula based on SCr of 0.93 mg/dL).    Allergies  Allergen Reactions   Amoxicillin Other (See Comments)    Unknown reaction per facility   Ampicillin Other (See Comments)    Unknown reaction per facility   Tetracyclines & Related Rash     Perri Olam Murray, PharmD 01/20/2024 10:02 AM

## 2024-01-21 DIAGNOSIS — R6510 Systemic inflammatory response syndrome (SIRS) of non-infectious origin without acute organ dysfunction: Secondary | ICD-10-CM | POA: Diagnosis not present

## 2024-01-21 LAB — PHOSPHORUS: Phosphorus: 3.7 mg/dL (ref 2.5–4.6)

## 2024-01-21 LAB — CBC
HCT: 23.2 % — ABNORMAL LOW (ref 39.0–52.0)
Hemoglobin: 7.4 g/dL — ABNORMAL LOW (ref 13.0–17.0)
MCH: 29.4 pg (ref 26.0–34.0)
MCHC: 31.9 g/dL (ref 30.0–36.0)
MCV: 92.1 fL (ref 80.0–100.0)
Platelets: 331 10*3/uL (ref 150–400)
RBC: 2.52 MIL/uL — ABNORMAL LOW (ref 4.22–5.81)
RDW: 14.3 % (ref 11.5–15.5)
WBC: 14.5 10*3/uL — ABNORMAL HIGH (ref 4.0–10.5)
nRBC: 0 % (ref 0.0–0.2)

## 2024-01-21 LAB — GLUCOSE, CAPILLARY
Glucose-Capillary: 33 mg/dL — CL (ref 70–99)
Glucose-Capillary: 134 mg/dL — ABNORMAL HIGH (ref 70–99)
Glucose-Capillary: 114 mg/dL — ABNORMAL HIGH (ref 70–99)
Glucose-Capillary: 90 mg/dL (ref 70–99)

## 2024-01-21 LAB — CULTURE, BLOOD (ROUTINE X 2)
Culture: NO GROWTH
Culture: NO GROWTH
Special Requests: ADEQUATE

## 2024-01-21 LAB — MAGNESIUM: Magnesium: 1.9 mg/dL (ref 1.7–2.4)

## 2024-01-21 MED ORDER — METRONIDAZOLE 500 MG PO TABS
500.0000 mg | ORAL_TABLET | Freq: Two times a day (BID) | ORAL | Status: DC
  Administered 2024-01-21 – 2024-01-26 (×10): 500 mg via ORAL
  Filled 2024-01-21 (×10): qty 1

## 2024-01-21 NOTE — Progress Notes (Signed)
 PROGRESS NOTE    Jacob Mcgrath.  FMW:[id number] DOB: [birth year] DOA: 01/16/2024 PCP: [name], MD   Brief Narrative:  This 68 yrs old male with medical history significant for dementia, type 2 diabetes mellitus, hypertension, PAD s/p stenting, epilepsy with history of status epilepticus, depression, history of stroke.  Patient was brought to the ED from [facility] long term care with report of fevers, tremors that started 6/23.  He apparently had been on a 14 day course of ertapenem at SNF for diabetic foot infection/ulcer.  Facility reports fever of 103 this morning, He was given Tylenol  prior to arrival.  They note necrotic appearing areas on feet.  Workup in the ED reveals Left foot xray  > No evidence of osteomyelitis.  CT angio shows occluded vasculature.  Patient was initiated on IV antibiotics.  Patient was admitted for further evaluation.  General Surgery and vascular surgery is consulted.  Assessment & Plan:   Principal Problem:   SIRS (systemic inflammatory response syndrome) (HCC) Active Problems:   Stroke (HCC)   Acute metabolic encephalopathy   Decubitus ulcers   PAD (peripheral artery disease) (HCC)   Controlled type 2 diabetes mellitus without complication, without long-term current use of insulin  (HCC)   Depression   Dementia without behavioral disturbance (HCC)   Essential hypertension   Seizure (HCC)   Ulcer of left foot (HCC)   Sepsis from LLE cellulitis : Patient presented with sepsis criteria (tachycardia, leukocytosis, tachypnea, normal lactic acid, source) Nursing home reports fever- 103, he was given Tylenol  prior to arrival.   He has chronic  left lower extremity wounds which may be etiology of infection.   Left foot x-ray-no evidence of osteomyelitis.   UA with trace leukocytes not quite convincing as etiology.  Continue IV fluid resuscitation. Continue broad-spectrum antibiotics ( IV vancomycin , cefepime  and metronidazole ) Blood  cultures no growth, urine culture insignificant growth. Sepsis physiology improving.   PAD (peripheral artery disease) - severe : At this time No suggestion of ischemia. Follows with vascular surgeon Dr. Magda -last visit 04/2023.  CT angio bifemoral- Occluded left SFA stent extending into the proximal popliteal artery.  Occluded left posterior tibial artery to the mid calf. Patent left external iliac artery stent. Short-segment high-grade stenosis mid right external iliac artery. Resume Plavix  General Surgeon Dr. Evonnie, recommended  inpatient vascular surgery consultation,  Patient was evaluated by Dr. Sheree recommended left above-knee amputation. Vascular surgery is unable to reach legal guardian about decision.  Palliative care consulted to discuss goals of care. Patient needs above-knee amputation given significant PAD.   Decubitus ulcers: Left lower extremity decubitus ulcers-on lateral side.  Unstageable.  Black eschar present.  Possible cellulitis, but due to hyperpigmentation hard to tell, but no fluctuance or swelling appreciated.  Left foot x-ray- No acute abnormality. Continue Broad-spectrum antibiotics for now Per MAR-he had been on a 14 day course of  IV Ertepenem for diabetic foot ulcer.   Essential hypertension: Blood pressure soft but improving after hydration. Resume carvedilol  6.25 mg twice daily with hold parameters.   Acute metabolic encephalopathy: Now more awake. Baseline dementia.   Unable to determine severity of patient's baseline dementia.    Continue IV antibiotics, IV fluids.   Dementia without behavioral disturbance ; Delirium precautions He needs goals of care Discussion.   Epilepsy: Resumed oral Keppra .   Controlled type 2 diabetes mellitus: Hypoglycemia .  - HgbA1c - 5.9%  - DC SSI coverage - he doesn't need it - try to avoid  hypoglycemia.   DVT prophylaxis: Lovenox  Code Status: Full code Family Communication: No family at  bedside. Disposition Plan:    Status is: Inpatient Remains inpatient appropriate because: Inpatient  Admitted for decreased left lower extremity perfusion,  vascular surgery recommended above-knee amputation. Palliative care consulted to discuss goals of care.  Legal caregiver wants to treat the treatable.  Consultants:  Surgery Vascular surgery  Procedures: CT Angio  Antimicrobials:  Anti-infectives (From admission, onward)    Start     Dose/Rate Route Frequency Ordered Stop   01/17/24 0800  vancomycin  (VANCOREADY) IVPB 750 mg/150 mL        750 mg 150 mL/hr over 60 Minutes Intravenous Every 12 hours 01/16/24 2112     01/16/24 2200  metroNIDAZOLE  (FLAGYL ) IVPB 500 mg        500 mg 100 mL/hr over 60 Minutes Intravenous Every 12 hours 01/16/24 2104     01/16/24 2200  ceFEPIme  (MAXIPIME ) 2 g in sodium chloride  0.9 % 100 mL IVPB        2 g 200 mL/hr over 30 Minutes Intravenous Every 8 hours 01/16/24 2107     01/16/24 2200  vancomycin  (VANCOREADY) IVPB 500 mg/100 mL        500 mg 100 mL/hr over 60 Minutes Intravenous  Once 01/16/24 2108 01/17/24 0727   01/16/24 1500  vancomycin  (VANCOCIN ) IVPB 1000 mg/200 mL premix        1,000 mg 200 mL/hr over 60 Minutes Intravenous  Once 01/16/24 1449 01/16/24 1618   01/16/24 1245  cefTRIAXone  (ROCEPHIN ) 1 g in sodium chloride  0.9 % 100 mL IVPB        1 g 200 mL/hr over 30 Minutes Intravenous  Once 01/16/24 1239 01/16/24 1436       Subjective: Patient was seen and examined at bedside. Overnight events noted. Patient has baseline dementia, he follows simple commands otherwise appears at his baseline. Patient has decreased perfusion in the left lower extremity.  Objective: Vitals:   01/20/24 1654 01/20/24 2206 01/21/24 0327 01/21/24 0941  BP: 130/77 136/60 (!) 154/75 (!) 181/75  Pulse: (!) 52 98 60   Resp: 18 20 20 20   Temp: 98.4 F (36.9 C) 98.2 F (36.8 C) (!) 97.4 F (36.3 C) 97.8 F (36.6 C)  TempSrc:   Axillary   SpO2: 100%  99% 98%   Weight:      Height:        Intake/Output Summary (Last 24 hours) at 01/21/2024 1154 Last data filed at 01/21/2024 1011 Gross per 24 hour  Intake 318 ml  Output 1250 ml  Net -932 ml   Filed Weights   01/16/24 1223 01/16/24 2000  Weight: 62.3 kg 65.6 kg    Examination:  General exam: Appears comfortable, appears deconditioned, not in any acute distress. Respiratory system: CTA Bilaterally. Respiratory effort normal. RR 14 Cardiovascular system: S1 & S2 heard, RRR. No JVD, murmurs, rubs, gallops or clicks. Gastrointestinal system: Abdomen is non distended, soft and non tender. Normal bowel sounds heard. Central nervous system: Alert and oriented x 1. No focal neurological deficits. Extremities: Left hemiparesis.  Necrotic appearing skin on the feet and sacrum. Skin: No rashes, lesions or ulcers Psychiatry: Judgement and insight appear normal. Mood & affect appropriate.     Data Reviewed: I have personally reviewed following labs and imaging studies  CBC: Recent Labs  Lab 01/16/24 1244 01/17/24 0416 01/19/24 0404 01/20/24 0526  WBC 15.1* 17.7* 14.2* 13.7*  NEUTROABS 9.5*  --   --   --  HGB 9.6* 11.4* 8.6* 8.0*  HCT 30.0* 37.6* 26.7* 25.3*  MCV 90.6 94.0 89.9 91.3  PLT 284 303 302 330   Basic Metabolic Panel: Recent Labs  Lab 01/16/24 1244 01/17/24 0416 01/18/24 0848 01/19/24 0404 01/20/24 0526 01/21/24 0503  NA 139 139  --  140  --   --   K 4.2 4.1  --  3.7  --   --   CL 105 105  --  110  --   --   CO2 25 25  --  19*  --   --   GLUCOSE 123* 107*  --  112*  --   --   BUN 12 9  --  13  --   --   CREATININE 0.84 0.80 0.88 0.89 0.93  --   CALCIUM  8.6* 9.0  --  8.4*  --   --   MG  --   --   --  1.8  --  1.9  PHOS  --   --   --  3.6  --  3.7   GFR: Estimated Creatinine Clearance: 71.5 mL/min (by C-G formula based on SCr of 0.93 mg/dL). Liver Function Tests: Recent Labs  Lab 01/16/24 1244  AST 31  ALT 17  ALKPHOS 61  BILITOT 0.6  PROT 7.4   ALBUMIN 2.6*   No results for input(s): LIPASE, AMYLASE in the last 168 hours. No results for input(s): AMMONIA in the last 168 hours. Coagulation Profile: Recent Labs  Lab 01/16/24 1244  INR 1.1   Cardiac Enzymes: No results for input(s): CKTOTAL, CKMB, CKMBINDEX, TROPONINI in the last 168 hours. BNP (last 3 results) No results for input(s): PROBNP in the last 8760 hours. HbA1C: No results for input(s): HGBA1C in the last 72 hours.  CBG: Recent Labs  Lab 01/19/24 1649 01/19/24 2013 01/20/24 0934 01/20/24 1226 01/20/24 2209  GLUCAP 85 125* 135* 122* 100*   Lipid Profile: No results for input(s): CHOL, HDL, LDLCALC, TRIG, CHOLHDL, LDLDIRECT in the last 72 hours. Thyroid Function Tests: No results for input(s): TSH, T4TOTAL, FREET4, T3FREE, THYROIDAB in the last 72 hours. Anemia Panel: No results for input(s): VITAMINB12, FOLATE, FERRITIN, TIBC, IRON, RETICCTPCT in the last 72 hours. Sepsis Labs: Recent Labs  Lab 01/16/24 1244  LATICACIDVEN 1.3    Recent Results (from the past 240 hours)  Blood Culture (routine x 2)     Status: None   Collection Time: 01/16/24 12:44 PM   Specimen: BLOOD  Result Value Ref Range Status   Specimen Description BLOOD BLOOD RIGHT ARM  Final   Special Requests   Final    BOTTLES DRAWN AEROBIC AND ANAEROBIC Blood Culture results may not be optimal due to an inadequate volume of blood received in culture bottles   Culture   Final    NO GROWTH 5 DAYS Performed at [facility], [address] [place], [place] [address]    Report Status 01/21/2024 FINAL  Final  Blood Culture (routine x 2)     Status: None   Collection Time: 01/16/24 12:55 PM   Specimen: BLOOD  Result Value Ref Range Status   Specimen Description BLOOD LEFT ASSIST CONTROL  Final   Special Requests   Final    BOTTLES DRAWN AEROBIC AND ANAEROBIC Blood Culture adequate volume   Culture   Final    NO GROWTH 5  DAYS Performed at [facility], [address] [place], [place] [address]    Report Status 01/21/2024 FINAL  Final  Resp panel by RT-PCR (RSV, Flu A&B, Covid) Anterior Nasal Swab     Status: None   Collection Time: 01/16/24  1:07 PM   Specimen: Anterior Nasal Swab  Result Value Ref Range Status   SARS Coronavirus 2 by RT PCR NEGATIVE NEGATIVE Final    Comment: (NOTE) SARS-CoV-2 target nucleic acids are NOT DETECTED.  The SARS-CoV-2 RNA is generally detectable in upper respiratory specimens during the acute phase of infection. The lowest concentration of SARS-CoV-2 viral copies this assay can detect is 138 copies/mL. A negative result does not preclude SARS-Cov-2 infection and should not be used as the sole basis for treatment or other patient management decisions. A negative result may occur with  improper specimen collection/handling, submission of specimen other than nasopharyngeal swab, presence of viral mutation(s) within the areas targeted by this assay, and inadequate number of viral copies(<138 copies/mL). A negative result must be combined with clinical observations, patient history, and epidemiological information. The expected result is Negative.  Fact Sheet for Patients:  BloggerCourse.com  Fact Sheet for Healthcare Providers:  SeriousBroker.it  This test is no t yet approved or cleared by the [place]  FDA and  has been authorized for detection and/or diagnosis of SARS-CoV-2 by FDA under an Emergency Use Authorization (EUA). This EUA will remain  in effect (meaning this test can be used) for the duration of the COVID-19 declaration under Section 564(b)(1) of the Act, 21 U.S.C.section 360bbb-3(b)(1), unless the authorization is terminated  or revoked sooner.       Influenza A by PCR NEGATIVE NEGATIVE Final   Influenza B by PCR NEGATIVE NEGATIVE Final    Comment: (NOTE) The Xpert Xpress SARS-CoV-2/FLU/RSV plus  assay is intended as an aid in the diagnosis of influenza from Nasopharyngeal swab specimens and should not be used as a sole basis for treatment. Nasal washings and aspirates are unacceptable for Xpert Xpress SARS-CoV-2/FLU/RSV testing.  Fact Sheet for Patients: BloggerCourse.com  Fact Sheet for Healthcare Providers: SeriousBroker.it  This test is not yet approved or cleared by the [place]  FDA and has been authorized for detection and/or diagnosis of SARS-CoV-2 by FDA under an Emergency Use Authorization (EUA). This EUA will remain in effect (meaning this test can be used) for the duration of the COVID-19 declaration under Section 564(b)(1) of the Act, 21 U.S.C. section 360bbb-3(b)(1), unless the authorization is terminated or revoked.     Resp Syncytial Virus by PCR NEGATIVE NEGATIVE Final    Comment: (NOTE) Fact Sheet for Patients: BloggerCourse.com  Fact Sheet for Healthcare Providers: SeriousBroker.it  This test is not yet approved or cleared by the [place]  FDA and has been authorized for detection and/or diagnosis of SARS-CoV-2 by FDA under an Emergency Use Authorization (EUA). This EUA will remain in effect (meaning this test can be used) for the duration of the COVID-19 declaration under Section 564(b)(1) of the Act, 21 U.S.C. section 360bbb-3(b)(1), unless the authorization is terminated or revoked.  Performed at [facility], [address] [place], [place] [address]   Urine Culture     Status: Abnormal   Collection Time: 01/16/24  1:42 PM   Specimen: Urine, Random  Result Value Ref Range Status   Specimen Description   Final    URINE, RANDOM Performed at [facility], [address], [place], [place] [address]    Special Requests   Final    NONE Reflexed from [id number] Performed at [facility], [address], [place], [place] [address]    Culture (A)  Final    <10,000 COLONIES/mL INSIGNIFICANT GROWTH Performed at [facility], [address], [place], [place] [address]    Report Status 01/17/2024 FINAL  Final  MRSA Next Gen by PCR, Nasal     Status: Abnormal   Collection Time: 01/16/24  7:40 PM   Specimen: Nasal Mucosa; Nasal Swab  Result Value Ref Range Status   MRSA by PCR Next Gen DETECTED (A) NOT DETECTED Final    Comment: RESULT CALLED TO, READ BACK BY AND VERIFIED WITH: VELMA LINGER, RN AT 2136 01/16/24 BY A. SNYDER (NOTE) The GeneXpert MRSA Assay (FDA approved for NASAL specimens only), is one component of a comprehensive MRSA colonization surveillance program. It is not intended to diagnose MRSA infection nor to guide or monitor treatment for MRSA infections. Test performance is not FDA approved in patients less than 25 years old. Performed at [facility], [address] [place], [place] [address]     Radiology Studies: No results found.  Scheduled Meds:  (feeding supplement) PROSource Plus  30 mL Oral BID BM   aspirin  EC  81 mg Oral Daily   atorvastatin   80 mg Oral QHS   baclofen   5 mg Oral TID   carvedilol   6.25 mg Oral BID WC   Chlorhexidine  Gluconate Cloth  6 each Topical Daily   clopidogrel   75 mg Oral Q breakfast   DULoxetine   30 mg Oral Daily   enoxaparin  (LOVENOX ) injection  40 mg Subcutaneous Q24H   levETIRAcetam   1,500 mg Oral BID   melatonin  6 mg Oral QHS   multivitamin with minerals  1 tablet Oral Daily   mupirocin  ointment   Nasal BID   pantoprazole   40 mg Oral Daily   PARoxetine   40 mg Oral Daily   tamsulosin   0.4 mg Oral Daily   valproic  acid  750 mg Oral BID   Continuous Infusions:  ceFEPime  (MAXIPIME ) IV 2 g (01/21/24 0548)   metronidazole  500 mg (01/21/24 1011)   vancomycin  750 mg (01/21/24 0825)     LOS: 5 days    Time spent: 35 mins    Darcel Dawley, MD [facility]   If 7PM-7AM, please contact night-coverage

## 2024-01-21 NOTE — Progress Notes (Signed)
 1217: This RN made aware patient's BG was 33. Upon assessment, patient asymptomatic. Oriented to self and place which is baseline for patient. Patient given 240 mL of orange juice and BG will be rechecked at 1230. MD Darcel Dawley notified.

## 2024-01-22 DIAGNOSIS — R6510 Systemic inflammatory response syndrome (SIRS) of non-infectious origin without acute organ dysfunction: Secondary | ICD-10-CM | POA: Diagnosis not present

## 2024-01-22 LAB — VANCOMYCIN, PEAK: Vancomycin Pk: 39 ug/mL (ref 30–40)

## 2024-01-22 LAB — GLUCOSE, CAPILLARY
Glucose-Capillary: 65 mg/dL — ABNORMAL LOW (ref 70–99)
Glucose-Capillary: 94 mg/dL (ref 70–99)
Glucose-Capillary: 87 mg/dL (ref 70–99)

## 2024-01-22 LAB — VANCOMYCIN, TROUGH: Vancomycin Tr: 18 ug/mL (ref 15–20)

## 2024-01-22 MED ORDER — VANCOMYCIN HCL 500 MG/100ML IV SOLN
500.0000 mg | Freq: Two times a day (BID) | INTRAVENOUS | Status: DC
  Administered 2024-01-23 – 2024-01-26 (×7): 500 mg via INTRAVENOUS
  Filled 2024-01-22 (×8): qty 100

## 2024-01-22 NOTE — Consult Note (Signed)
 Pharmacy Antibiotic Note  Jacob Mcgrath. is a 68 y.o. male admitted on 01/16/2024 with sepsis.  Pharmacy has been consulted for cefepime  and vancomycin  dosing.  6/29 VP @1307  = 39 mcg/mL (~1h after dose) 6/29 VT @2130  = 18 mcg/mL Calculated Ke 0.0922, eAUC 657 on 750 mg Q12h  Plan: Adjust Vancomycin  500 mg IV Q 12 Continue Cefepime  2 grams IV Q 8 hours F/u duration of abx  Height: 5' 9 (175.3 cm) Weight: 65.6 kg (144 lb 10 oz) IBW/kg (Calculated) : 70.7  Temp (24hrs), Avg:97.9 F (36.6 C), Min:97.4 F (36.3 C), Max:98.4 F (36.9 C)  Recent Labs  Lab 01/16/24 1244 01/17/24 0416 01/18/24 0848 01/19/24 0404 01/20/24 0526 01/21/24 1044 01/22/24 1307 01/22/24 2130  WBC 15.1* 17.7*  --  14.2* 13.7* 14.5*  --   --   CREATININE 0.84 0.80 0.88 0.89 0.93  --   --   --   LATICACIDVEN 1.3  --   --   --   --   --   --   --   VANCOTROUGH  --   --   --   --   --   --   --  18  VANCOPEAK  --   --   --   --   --   --  39  --     Estimated Creatinine Clearance: 71.5 mL/min (by C-G formula based on SCr of 0.93 mg/dL).    Allergies  Allergen Reactions   Amoxicillin Other (See Comments)    Unknown reaction per facility   Ampicillin Other (See Comments)    Unknown reaction per facility   Tetracyclines & Related Rash    6/23 BCx: negative 6/23 UCx: insign growth 6/23 MRSA: positive   Jacob Mcgrath, PharmD 01/22/2024 10:32 PM

## 2024-01-22 NOTE — Progress Notes (Signed)
 Notified physician of patient's blood pressure and 02 sats. BP 142/109 and 02 sats at 84% on 2L 02/[place].  Ongoing assessment of patient.

## 2024-01-22 NOTE — Progress Notes (Signed)
 PROGRESS NOTE    Jacob Mcgrath.  FMW:[id number] DOB: 04-Dec-1955 DOA: 01/16/2024 PCP: [name], MD   Brief Narrative:  This 68 yrs old male with medical history significant for dementia, type 2 diabetes mellitus, hypertension, PAD s/p stenting, epilepsy with history of status epilepticus, depression, history of stroke.  Patient was brought to the ED from [facility] long term care with report of fevers, tremors that started 6/23.  He apparently had been on a 14 day course of ertapenem at SNF for diabetic foot infection/ulcer.  Facility reports fever of 103 this morning, He was given Tylenol  prior to arrival.  They note necrotic appearing areas on feet.  Workup in the ED reveals Left foot xray  > No evidence of osteomyelitis.  CT angio shows occluded vasculature.  Patient was initiated on IV antibiotics.  Patient was admitted for further evaluation.  General Surgery and vascular surgery is consulted.  Assessment & Plan:   Principal Problem:   SIRS (systemic inflammatory response syndrome) (HCC) Active Problems:   Stroke (HCC)   Acute metabolic encephalopathy   Decubitus ulcers   PAD (peripheral artery disease) (HCC)   Controlled type 2 diabetes mellitus without complication, without long-term current use of insulin  (HCC)   Depression   Dementia without behavioral disturbance (HCC)   Essential hypertension   Seizure (HCC)   Ulcer of left foot (HCC)   Sepsis from LLE cellulitis : Patient presented with sepsis criteria (tachycardia, leukocytosis, tachypnea, normal lactic acid, source) Nursing home reports fever- 103, he was given Tylenol  prior to arrival.   He has chronic  left lower extremity wounds which may be etiology of infection.   Left foot x-ray-no evidence of osteomyelitis.   UA with trace leukocytes not quite convincing as etiology.  Continue IV fluid resuscitation. Continue broad-spectrum antibiotics ( IV vancomycin , cefepime  and metronidazole ) Blood  cultures no growth, urine culture insignificant growth. Sepsis physiology improving.   PAD (peripheral artery disease) - severe : At this time No suggestion of ischemia. Follows with vascular surgeon Dr. Magda -last visit 04/2023.  CT angio bifemoral- Occluded left SFA stent extending into the proximal popliteal artery.  Occluded left posterior tibial artery to the mid calf. Patent left external iliac artery stent. Short-segment high-grade stenosis mid right external iliac artery. Resume Plavix  General Surgeon Dr. Evonnie, recommended  inpatient vascular surgery consultation,  Patient was evaluated by Dr. Sheree recommended left above-knee amputation. Vascular surgery is unable to reach legal guardian about decision.  Palliative care consulted to discuss goals of care. Patient needs above-knee amputation given significant PAD. Significant other wants to keep him full code and treat the treatable.   Decubitus ulcers: Left lower extremity decubitus ulcers-on lateral side.  Unstageable.  Black eschar present.  Possible cellulitis, but due to hyperpigmentation hard to tell, but no fluctuance or swelling appreciated.  Left foot x-ray- No acute abnormality. Continue Broad-spectrum antibiotics for now Per MAR-he had been on a 14 day course of  IV Ertepenem for diabetic foot ulcer.   Essential hypertension: Blood pressure soft but improving after hydration. Resume carvedilol  6.25 mg twice daily with hold parameters.   Acute metabolic encephalopathy: Now more awake. Baseline dementia.   Unable to determine severity of patient's baseline dementia.    Continue IV antibiotics, IV fluids.   Dementia without behavioral disturbance ; Delirium precautions He needs goals of care Discussion.   Epilepsy: Resumed oral Keppra .   Controlled type 2 diabetes mellitus: Hypoglycemia .  - HgbA1c - 5.9%  -  DC SSI coverage - he doesn't need it - try to avoid hypoglycemia.   DVT prophylaxis:  Lovenox  Code Status: Full code Family Communication: No family at bedside. Disposition Plan:    Status is: Inpatient Remains inpatient appropriate because: Inpatient  Admitted for decreased left lower extremity perfusion,  vascular surgery recommended above-knee amputation. Palliative care consulted to discuss goals of care.  Legal caregiver wants to treat the treatable.  Consultants:  Surgery Vascular surgery  Procedures: CT Angio  Antimicrobials:  Anti-infectives (From admission, onward)    Start     Dose/Rate Route Frequency Ordered Stop   01/21/24 2200  metroNIDAZOLE  (FLAGYL ) tablet 500 mg        500 mg Oral Every 12 hours 01/21/24 1447     01/17/24 0800  vancomycin  (VANCOREADY) IVPB 750 mg/150 mL        750 mg 150 mL/hr over 60 Minutes Intravenous Every 12 hours 01/16/24 2112     01/16/24 2200  metroNIDAZOLE  (FLAGYL ) IVPB 500 mg  Status:  Discontinued        500 mg 100 mL/hr over 60 Minutes Intravenous Every 12 hours 01/16/24 2104 01/21/24 1447   01/16/24 2200  ceFEPIme  (MAXIPIME ) 2 g in sodium chloride  0.9 % 100 mL IVPB        2 g 200 mL/hr over 30 Minutes Intravenous Every 8 hours 01/16/24 2107     01/16/24 2200  vancomycin  (VANCOREADY) IVPB 500 mg/100 mL        500 mg 100 mL/hr over 60 Minutes Intravenous  Once 01/16/24 2108 01/17/24 0727   01/16/24 1500  vancomycin  (VANCOCIN ) IVPB 1000 mg/200 mL premix        1,000 mg 200 mL/hr over 60 Minutes Intravenous  Once 01/16/24 1449 01/16/24 1618   01/16/24 1245  cefTRIAXone  (ROCEPHIN ) 1 g in sodium chloride  0.9 % 100 mL IVPB        1 g 200 mL/hr over 30 Minutes Intravenous  Once 01/16/24 1239 01/16/24 1436       Subjective: Patient was seen and examined at bedside. Overnight events noted. Patient has baseline dementia, he follows simple commands otherwise appears at his baseline. Patient has decreased perfusion in the left lower extremity.  Objective: Vitals:   01/21/24 2133 01/22/24 0515 01/22/24 0557 01/22/24  0811  BP: 100/84 (!) 142/109 (!) 158/73 135/66  Pulse: 72 80 99 88  Resp: 20 18  19   Temp: (!) 97.4 F (36.3 C) 97.7 F (36.5 C)  (!) 97.4 F (36.3 C)  TempSrc: Oral Oral    SpO2: (!) 82% (!) 84% 94% 100%  Weight:      Height:        Intake/Output Summary (Last 24 hours) at 01/22/2024 1214 Last data filed at 01/22/2024 9375 Gross per 24 hour  Intake 354 ml  Output 500 ml  Net -146 ml   Filed Weights   01/16/24 1223 01/16/24 2000  Weight: 62.3 kg 65.6 kg    Examination:  General exam: Appears comfortable, appears deconditioned, not in any acute distress. Respiratory system: CTA Bilaterally. Respiratory effort normal. RR 15 Cardiovascular system: S1 & S2 heard, RRR. No JVD, murmurs, rubs, gallops or clicks. Gastrointestinal system: Abdomen is non distended, soft and non tender. Normal bowel sounds heard. Central nervous system: Alert and oriented x 1. No focal neurological deficits. Extremities: Left hemiparesis.  Necrotic appearing skin on the feet and sacrum. Skin: No rashes, lesions or ulcers Psychiatry: Judgement and insight appear normal. Mood & affect appropriate.  Data Reviewed: I have personally reviewed following labs and imaging studies  CBC: Recent Labs  Lab 01/16/24 1244 01/17/24 0416 01/19/24 0404 01/20/24 0526 01/21/24 1044  WBC 15.1* 17.7* 14.2* 13.7* 14.5*  NEUTROABS 9.5*  --   --   --   --   HGB 9.6* 11.4* 8.6* 8.0* 7.4*  HCT 30.0* 37.6* 26.7* 25.3* 23.2*  MCV 90.6 94.0 89.9 91.3 92.1  PLT 284 303 302 330 331   Basic Metabolic Panel: Recent Labs  Lab 01/16/24 1244 01/17/24 0416 01/18/24 0848 01/19/24 0404 01/20/24 0526 01/21/24 0503  NA 139 139  --  140  --   --   K 4.2 4.1  --  3.7  --   --   CL 105 105  --  110  --   --   CO2 25 25  --  19*  --   --   GLUCOSE 123* 107*  --  112*  --   --   BUN 12 9  --  13  --   --   CREATININE 0.84 0.80 0.88 0.89 0.93  --   CALCIUM  8.6* 9.0  --  8.4*  --   --   MG  --   --   --  1.8  --  1.9   PHOS  --   --   --  3.6  --  3.7   GFR: Estimated Creatinine Clearance: 71.5 mL/min (by C-G formula based on SCr of 0.93 mg/dL). Liver Function Tests: Recent Labs  Lab 01/16/24 1244  AST 31  ALT 17  ALKPHOS 61  BILITOT 0.6  PROT 7.4  ALBUMIN 2.6*   No results for input(s): LIPASE, AMYLASE in the last 168 hours. No results for input(s): AMMONIA in the last 168 hours. Coagulation Profile: Recent Labs  Lab 01/16/24 1244  INR 1.1   Cardiac Enzymes: No results for input(s): CKTOTAL, CKMB, CKMBINDEX, TROPONINI in the last 168 hours. BNP (last 3 results) No results for input(s): PROBNP in the last 8760 hours. HbA1C: No results for input(s): HGBA1C in the last 72 hours.  CBG: Recent Labs  Lab 01/21/24 1207 01/21/24 1251 01/21/24 1656 01/21/24 2114 01/22/24 0808  GLUCAP 33* 134* 114* 90 65*   Lipid Profile: No results for input(s): CHOL, HDL, LDLCALC, TRIG, CHOLHDL, LDLDIRECT in the last 72 hours. Thyroid Function Tests: No results for input(s): TSH, T4TOTAL, FREET4, T3FREE, THYROIDAB in the last 72 hours. Anemia Panel: No results for input(s): VITAMINB12, FOLATE, FERRITIN, TIBC, IRON, RETICCTPCT in the last 72 hours. Sepsis Labs: Recent Labs  Lab 01/16/24 1244  LATICACIDVEN 1.3    Recent Results (from the past 240 hours)  Blood Culture (routine x 2)     Status: None   Collection Time: 01/16/24 12:44 PM   Specimen: BLOOD  Result Value Ref Range Status   Specimen Description BLOOD BLOOD RIGHT ARM  Final   Special Requests   Final    BOTTLES DRAWN AEROBIC AND ANAEROBIC Blood Culture results may not be optimal due to an inadequate volume of blood received in culture bottles   Culture   Final    NO GROWTH 5 DAYS Performed at [facility], [address] [place], [place] [address]    Report Status 01/21/2024 FINAL  Final  Blood Culture (routine x 2)     Status: None   Collection Time: 01/16/24 12:55 PM    Specimen: BLOOD  Result Value Ref Range Status   Specimen Description BLOOD LEFT ASSIST CONTROL  Final   Special Requests   Final    BOTTLES DRAWN AEROBIC AND ANAEROBIC Blood Culture adequate volume   Culture   Final    NO GROWTH 5 DAYS Performed at [facility], [address] [place], [place] [address]    Report Status 01/21/2024 FINAL  Final  Resp panel by RT-PCR (RSV, Flu A&B, Covid) Anterior Nasal Swab     Status: None   Collection Time: 01/16/24  1:07 PM   Specimen: Anterior Nasal Swab  Result Value Ref Range Status   SARS Coronavirus 2 by RT PCR NEGATIVE NEGATIVE Final    Comment: (NOTE) SARS-CoV-2 target nucleic acids are NOT DETECTED.  The SARS-CoV-2 RNA is generally detectable in upper respiratory specimens during the acute phase of infection. The lowest concentration of SARS-CoV-2 viral copies this assay can detect is 138 copies/mL. A negative result does not preclude SARS-Cov-2 infection and should not be used as the sole basis for treatment or other patient management decisions. A negative result may occur with  improper specimen collection/handling, submission of specimen other than nasopharyngeal swab, presence of viral mutation(s) within the areas targeted by this assay, and inadequate number of viral copies(<138 copies/mL). A negative result must be combined with clinical observations, patient history, and epidemiological information. The expected result is Negative.  Fact Sheet for Patients:  BloggerCourse.com  Fact Sheet for Healthcare Providers:  SeriousBroker.it  This test is no t yet approved or cleared by the [place]  FDA and  has been authorized for detection and/or diagnosis of SARS-CoV-2 by FDA under an Emergency Use Authorization (EUA). This EUA will remain  in effect (meaning this test can be used) for the duration of the COVID-19 declaration under Section 564(b)(1) of the Act, 21 U.S.C.section  360bbb-3(b)(1), unless the authorization is terminated  or revoked sooner.       Influenza A by PCR NEGATIVE NEGATIVE Final   Influenza B by PCR NEGATIVE NEGATIVE Final    Comment: (NOTE) The Xpert Xpress SARS-CoV-2/FLU/RSV plus assay is intended as an aid in the diagnosis of influenza from Nasopharyngeal swab specimens and should not be used as a sole basis for treatment. Nasal washings and aspirates are unacceptable for Xpert Xpress SARS-CoV-2/FLU/RSV testing.  Fact Sheet for Patients: BloggerCourse.com  Fact Sheet for Healthcare Providers: SeriousBroker.it  This test is not yet approved or cleared by the [place]  FDA and has been authorized for detection and/or diagnosis of SARS-CoV-2 by FDA under an Emergency Use Authorization (EUA). This EUA will remain in effect (meaning this test can be used) for the duration of the COVID-19 declaration under Section 564(b)(1) of the Act, 21 U.S.C. section 360bbb-3(b)(1), unless the authorization is terminated or revoked.     Resp Syncytial Virus by PCR NEGATIVE NEGATIVE Final    Comment: (NOTE) Fact Sheet for Patients: BloggerCourse.com  Fact Sheet for Healthcare Providers: SeriousBroker.it  This test is not yet approved or cleared by the [place]  FDA and has been authorized for detection and/or diagnosis of SARS-CoV-2 by FDA under an Emergency Use Authorization (EUA). This EUA will remain in effect (meaning this test can be used) for the duration of the COVID-19 declaration under Section 564(b)(1) of the Act, 21 U.S.C. section 360bbb-3(b)(1), unless the authorization is terminated or revoked.  Performed at [facility], [address], [place], [place] [address]   Urine Culture     Status: Abnormal   Collection Time: 01/16/24  1:42 PM   Specimen: Urine, Random  Result Value Ref Range Status  Specimen Description    Final    URINE, RANDOM Performed at [facility], [address], [place], [place] [address]    Special Requests   Final    NONE Reflexed from [id number] Performed at [facility], [address] [place], [place] [address]    Culture (A)  Final    <10,000 COLONIES/mL INSIGNIFICANT GROWTH Performed at [facility], [address], [place], [place] [address]    Report Status 01/17/2024 FINAL  Final  MRSA Next Gen by PCR, Nasal     Status: Abnormal   Collection Time: 01/16/24  7:40 PM   Specimen: Nasal Mucosa; Nasal Swab  Result Value Ref Range Status   MRSA by PCR Next Gen DETECTED (A) NOT DETECTED Final    Comment: RESULT CALLED TO, READ BACK BY AND VERIFIED WITH: VELMA LINGER, RN AT 2136 01/16/24 BY A. SNYDER (NOTE) The GeneXpert MRSA Assay (FDA approved for NASAL specimens only), is one component of a comprehensive MRSA colonization surveillance program. It is not intended to diagnose MRSA infection nor to guide or monitor treatment for MRSA infections. Test performance is not FDA approved in patients less than 47 years old. Performed at [facility], [address], [place], [place] [address]     Radiology Studies: No results found.  Scheduled Meds:  (feeding supplement) PROSource Plus  30 mL Oral BID BM   aspirin  EC  81 mg Oral Daily   atorvastatin   80 mg Oral QHS   baclofen   5 mg Oral TID   carvedilol   6.25 mg Oral BID WC   Chlorhexidine  Gluconate Cloth  6 each Topical Daily   clopidogrel   75 mg Oral Q breakfast   DULoxetine   30 mg Oral Daily   enoxaparin  (LOVENOX ) injection  40 mg Subcutaneous Q24H   levETIRAcetam   1,500 mg Oral BID   melatonin  6 mg Oral QHS   metroNIDAZOLE   500 mg Oral Q12H   multivitamin with minerals  1 tablet Oral Daily   mupirocin  ointment   Nasal BID   pantoprazole   40 mg Oral Daily   PARoxetine   40 mg Oral Daily   tamsulosin   0.4 mg Oral Daily   valproic  acid  750 mg Oral BID   Continuous Infusions:  ceFEPime  (MAXIPIME ) IV 2 g  (01/21/24 2159)   vancomycin  750 mg (01/22/24 1059)     LOS: 6 days    Time spent: 35 mins    Darcel Dawley, MD [facility]   If 7PM-7AM, please contact night-coverage

## 2024-01-23 DIAGNOSIS — L03116 Cellulitis of left lower limb: Secondary | ICD-10-CM | POA: Diagnosis not present

## 2024-01-23 DIAGNOSIS — R6510 Systemic inflammatory response syndrome (SIRS) of non-infectious origin without acute organ dysfunction: Secondary | ICD-10-CM | POA: Diagnosis not present

## 2024-01-23 DIAGNOSIS — A419 Sepsis, unspecified organism: Secondary | ICD-10-CM | POA: Diagnosis not present

## 2024-01-23 DIAGNOSIS — I739 Peripheral vascular disease, unspecified: Secondary | ICD-10-CM | POA: Diagnosis not present

## 2024-01-23 LAB — CBC
HCT: 25.6 % — ABNORMAL LOW (ref 39.0–52.0)
Hemoglobin: 8.1 g/dL — ABNORMAL LOW (ref 13.0–17.0)
MCH: 28.7 pg (ref 26.0–34.0)
MCHC: 31.6 g/dL (ref 30.0–36.0)
MCV: 90.8 fL (ref 80.0–100.0)
Platelets: 503 10*3/uL — ABNORMAL HIGH (ref 150–400)
RBC: 2.82 MIL/uL — ABNORMAL LOW (ref 4.22–5.81)
RDW: 14.5 % (ref 11.5–15.5)
WBC: 14.4 10*3/uL — ABNORMAL HIGH (ref 4.0–10.5)
nRBC: 0 % (ref 0.0–0.2)

## 2024-01-23 LAB — BASIC METABOLIC PANEL WITH GFR
Anion gap: 10 (ref 5–15)
BUN: 16 mg/dL (ref 8–23)
CO2: 21 mmol/L — ABNORMAL LOW (ref 22–32)
Calcium: 8.7 mg/dL — ABNORMAL LOW (ref 8.9–10.3)
Chloride: 107 mmol/L (ref 98–111)
Creatinine, Ser: 0.97 mg/dL (ref 0.61–1.24)
GFR, Estimated: >60 mL/min (ref >60)
Glucose, Bld: 103 mg/dL — ABNORMAL HIGH (ref 70–99)
Potassium: 3.8 mmol/L (ref 3.5–5.1)
Sodium: 138 mmol/L (ref 135–145)

## 2024-01-23 LAB — PHOSPHORUS: Phosphorus: 3.1 mg/dL (ref 2.5–4.6)

## 2024-01-23 LAB — GLUCOSE, CAPILLARY
Glucose-Capillary: 101 mg/dL — ABNORMAL HIGH (ref 70–99)
Glucose-Capillary: 98 mg/dL (ref 70–99)
Glucose-Capillary: 122 mg/dL — ABNORMAL HIGH (ref 70–99)
Glucose-Capillary: 157 mg/dL — ABNORMAL HIGH (ref 70–99)

## 2024-01-23 LAB — MAGNESIUM: Magnesium: 1.8 mg/dL (ref 1.7–2.4)

## 2024-01-23 NOTE — Progress Notes (Signed)
 Daily Progress Note   Patient Name: Jacob Mcgrath.       Date: 01/23/2024 DOB: 02-Feb-1956  Age: 68 y.o. MRN#: [id number] Attending Physician: Davia Nydia POUR, MD Primary Care Physician: [name], MD Admit Date: 01/16/2024  Reason for Consultation/Follow-up: Establishing goals of care  Subjective: Medical records reviewed including progress notes, labs and imaging. Patient assessed at the bedside. He Is sleeping, easily aroused and pleasant. Requesting Sprite and reporting 8/10 pain in bilateral feet. Agreeable to PRN Tylenol . Discussed with RN. No visitors present.  Attempted to call patient's Legal Guardian [name] to follow up on GOC discussions, provide palliative support and assist with complex medical decision-making but was unable to reach. Left voicemail with PMT contact information.  Per previous conversations with PMT, LG planned to present case for discussion today/tomorrow and have a final decision on AKA and code status by Tuesday afternoon at the latest.  I then received a return phone call from patient's legal guardian who shared that they were just about to hold the meeting to discuss his case and will have a decision by later this afternoon or tomorrow.  She clarified the request for 2 physicians to complete DNR recommendation form - these 2 forms would be required in advance of decision for DNR being made.  She also requested a signed document from a physician explaining the need for amputation.  I provided explanation verbally including severe PAD and occlusion of stent on the left lower extremity.  Discussed how this relates to poor wound healing due to poor blood flow.  Coordinated with the legal guardian to receive email copy of DNR document and forwarded to patient's primary attending and 2 vascular  physicians, via email.  Questions and concerns addressed. PMT will continue to support holistically.   Length of Stay: 7   Physical Exam Vitals and nursing note reviewed.  Constitutional:      General: He is not in acute distress.    Appearance: He is ill-appearing.     Interventions: Nasal cannula in place.   Cardiovascular:     Rate and Rhythm: Normal rate.  Pulmonary:     Effort: Pulmonary effort is normal.   Neurological:     Mental Status: He is alert.   Psychiatric:        Behavior: Behavior is cooperative.             Vital Signs: BP (!) 144/54 (BP Location: Right Arm)   Pulse 91   Temp 98.6 F (37 C) (Oral)   Resp 18   Ht 5' 9 (1.753 m)   Wt 65.6 kg   SpO2 100%   BMI 21.36 kg/m  SpO2: SpO2: 100 % O2 Device: O2 Device: Room Air O2 Flow Rate: O2 Flow Rate (L/min): 2 L/min      Palliative Assessment/Data: 20 to 30%   Palliative Care Assessment & Plan   Patient Profile: Palliative Care consult requested for goals of care discussion in this 68 y.o. male  with past medical history of  diabetes, hypertension, CVA, dementia, depression, status epilepticus. Recent hospitalization on 5/15-5/23 for status epilepticus requiring intubation for seizure control. He was admitted on 01/16/2024 via EMS from [facility]  SNF with fever and tremors. Tmax morning of arrival was 103. Patient given Tylenol  per facility.  Patient recently on 14 day antibiotic for diabetic foot ulceration. During work-up noted to have necrotic areas on feet. Imaging negative for osteomyelitis. Started on IV antibiotics. 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   Assessment: Sepsis secondary to LLE cellulitis Severe PAD with occluded SFA stent on left LLE decubitis ulcers Dementia Acute metabolic encephalopathy  Recommendations/Plan: Continue full code for now; legal guardian will need 2 providers' recommendation and form completion prior to  vote to change CODE STATUS to DNR Continue with current care plan/full scope treatment. [facility]  leadership will be meeting this afternoon to determine whether to proceed with AKA or not.  A typed and sign document explained the need for this surgery has been requested as well PMT will continue to support and follow as needed   Prognosis:  Unable to determine  Discharge Planning: LTC [facility] plan was discussed with RN, MD, patient, legal guardian    Mickle SHAUNNA Fell, PA-C  Palliative Medicine Team Team phone # [phone]  Thank you for allowing the Palliative Medicine Team to assist in the care of this patient. Please utilize secure chat with additional questions, if there is no response within 30 minutes please call the above phone number.  Palliative Medicine Team providers are available by phone from 7am to 7pm daily and can be reached through the team cell phone.  Should this patient require assistance outside of these hours, please call the patient's attending physician.

## 2024-01-23 NOTE — Progress Notes (Signed)
 [facility]                                                                              Jacob Mcgrath, is a 68 y.o. male, DOB - 06-Nov-1955, FMW:[id number] Admit date - 01/16/2024    Outpatient Primary MD for the patient is Fanta, Tesfaye Demissie, MD  LOS - 7  days  Chief Complaint  Patient presents with   Fever   Tremors       Brief summary   Patient is a 68 year old male with dementia, DM type II, HTN, PAD status post stenting, epilepsy, depression, stroke presented from [facility] report of fevers, tremors that started 6/23. He apparently had been on a 14 day course of ertapenem at SNF for diabetic foot infection/ulcer. Facility reports fever of 103 this morning, He was given Tylenol  prior to arrival. They note necrotic appearing areas on feet. Workup in the ED reveals Left foot xray > No evidence of osteomyelitis. CT angio shows occluded vasculature. Patient was initiated on IV antibiotics. Patient was admitted for further evaluation.     Assessment & Plan    Sepsis from LLE cellulitis : - Presented with sepsis criteria (tachycardia, leukocytosis, tachypnea, normal lactic acid, source), fever 103 F at SNF,  chronic  left lower extremity wounds which may be etiology of infection.   -Left foot x-ray-no evidence of osteomyelitis.   -UA with trace leukocytes not quite convincing as etiology.  -Continue broad-spectrum IV antibiotics antibiotics, IV vancomycin , cefepime , Flagyl  -Blood cultures no growth, urine culture insignificant growth. -Sepsis physiology improving.   PAD (peripheral artery disease) - severe : -Surgery and vascular surgery consulted at this time -CT angio bifemoral- Occluded left SFA stent extending into the proximal popliteal artery.  Occluded left posterior tibial artery to the mid calf. Patent left external iliac artery stent. Short-segment high-grade stenosis mid right external iliac artery. - Per vascular surgery, Dr. Sheree, now with  nonviable left foot and occluded stents in the LLE, recommended AKA however unable to contact his legal guardia - Palliative medicine consulted for GOC    Decubitus ulcers: Left lower extremity decubitus ulcers-on lateral side.  Unstageable.  Black eschar present.  Possible cellulitis, but due to hyperpigmentation hard to tell, but no fluctuance or swelling appreciated.  Left foot x-ray- No acute abnormality. Continue Broad-spectrum antibiotics for now Per MAR-he had been on a 14 day course of  IV Ertepenem for diabetic foot ulcer.   Essential hypertension: BP stable, continue Coreg     Acute metabolic encephalopathy: Now more awake. Baseline dementia.   Unable to determine severity of patient's baseline dementia.    Continue IV antibiotics   Dementia without behavioral disturbance ; Delirium precautions Palliative medicine following   Epilepsy: Continue oral Keppra .   Controlled type 2 diabetes mellitus: Hypoglycemia .  - HgbA1c - 5.9%   CBG (last 3)  Recent Labs    01/22/24 2023 01/23/24 0831 01/23/24 1210  GLUCAP 87 101* 98    Estimated body mass index is 21.36 kg/m as calculated from the following:   Height as of this encounter: 5' 9 (1.753 m).   Weight as of this encounter: 65.6  kg.  Code Status:  DVT Prophylaxis:  enoxaparin  (LOVENOX ) injection 40 mg Start: 01/16/24 2130   Level of Care: Level of care: Telemetry Medical Family Communication: Updated patient Disposition Plan:      Remains inpatient appropriate:      Procedures:    Consultants:     Antimicrobials:   Anti-infectives (From admission, onward)    Start     Dose/Rate Route Frequency Ordered Stop   01/23/24 1000  vancomycin  (VANCOREADY) IVPB 500 mg/100 mL        500 mg 100 mL/hr over 60 Minutes Intravenous Every 12 hours 01/22/24 2234     01/21/24 2200  metroNIDAZOLE  (FLAGYL ) tablet 500 mg        500 mg Oral Every 12 hours 01/21/24 1447     01/17/24 0800  vancomycin  (VANCOREADY) IVPB  750 mg/150 mL  Status:  Discontinued        750 mg 150 mL/hr over 60 Minutes Intravenous Every 12 hours 01/16/24 2112 01/22/24 2234   01/16/24 2200  metroNIDAZOLE  (FLAGYL ) IVPB 500 mg  Status:  Discontinued        500 mg 100 mL/hr over 60 Minutes Intravenous Every 12 hours 01/16/24 2104 01/21/24 1447   01/16/24 2200  ceFEPIme  (MAXIPIME ) 2 g in sodium chloride  0.9 % 100 mL IVPB        2 g 200 mL/hr over 30 Minutes Intravenous Every 8 hours 01/16/24 2107     01/16/24 2200  vancomycin  (VANCOREADY) IVPB 500 mg/100 mL        500 mg 100 mL/hr over 60 Minutes Intravenous  Once 01/16/24 2108 01/17/24 0727   01/16/24 1500  vancomycin  (VANCOCIN ) IVPB 1000 mg/200 mL premix        1,000 mg 200 mL/hr over 60 Minutes Intravenous  Once 01/16/24 1449 01/16/24 1618   01/16/24 1245  cefTRIAXone  (ROCEPHIN ) 1 g in sodium chloride  0.9 % 100 mL IVPB        1 g 200 mL/hr over 30 Minutes Intravenous  Once 01/16/24 1239 01/16/24 1436          Medications  (feeding supplement) PROSource Plus  30 mL Oral BID BM   aspirin  EC  81 mg Oral Daily   atorvastatin   80 mg Oral QHS   baclofen   5 mg Oral TID   carvedilol   6.25 mg Oral BID WC   Chlorhexidine  Gluconate Cloth  6 each Topical Daily   clopidogrel   75 mg Oral Q breakfast   DULoxetine   30 mg Oral Daily   enoxaparin  (LOVENOX ) injection  40 mg Subcutaneous Q24H   levETIRAcetam   1,500 mg Oral BID   melatonin  6 mg Oral QHS   metroNIDAZOLE   500 mg Oral Q12H   multivitamin with minerals  1 tablet Oral Daily   mupirocin  ointment   Nasal BID   pantoprazole   40 mg Oral Daily   PARoxetine   40 mg Oral Daily   tamsulosin   0.4 mg Oral Daily   valproic  acid  750 mg Oral BID      Subjective:   Jacob Mcgrath was seen and examined today.  Sitting upright, eating breakfast, denies any specific complaints. No acute events overnight.  Has baseline dementia but follows simple commands.  Objective:   Vitals:   01/22/24 0811 01/22/24 1624 01/22/24 2021  01/23/24 0503  BP: 135/66 114/79 138/65 (!) 144/54  Pulse: 88 95 90 91  Resp: 19 19 18 18   Temp: (!) 97.4 F (36.3 C) 98.1 F (36.7 C) 98.4  F (36.9 C) 98.6 F (37 C)  TempSrc:  Oral Oral Oral  SpO2: 100% (!) 85% 100% 100%  Weight:      Height:        Intake/Output Summary (Last 24 hours) at 01/23/2024 1300 Last data filed at 01/23/2024 1258 Gross per 24 hour  Intake 790 ml  Output 500 ml  Net 290 ml     Wt Readings from Last 3 Encounters:  01/16/24 65.6 kg  12/15/23 62.3 kg  08/04/23 60 kg     Exam General: Alert, awake, NAD, ill-appearing, NAD Cardiovascular: S1 S2 auscultated,  RRR Respiratory: Clear to auscultation bilaterally, no wheezing, rales or rhonchi Gastrointestinal: Soft, nontender, nondistended, + bowel sounds Ext: no pedal edema bilaterally Neuro: left hemiparesis, necrotic appearing skin on the feet  Psych: Normal affect     Data Reviewed:  I have personally reviewed following labs    CBC Lab Results  Component Value Date   WBC 14.4 (H) 01/23/2024   RBC 2.82 (L) 01/23/2024   HGB 8.1 (L) 01/23/2024   HCT 25.6 (L) 01/23/2024   MCV 90.8 01/23/2024   MCH 28.7 01/23/2024   PLT 503 (H) 01/23/2024   MCHC 31.6 01/23/2024   RDW 14.5 01/23/2024   LYMPHSABS 2.8 01/16/2024   MONOABS 2.1 (H) 01/16/2024   EOSABS 0.5 01/16/2024   BASOSABS 0.0 01/16/2024     Last metabolic panel Lab Results  Component Value Date   NA 138 01/23/2024   K 3.8 01/23/2024   CL 107 01/23/2024   CO2 21 (L) 01/23/2024   BUN 16 01/23/2024   CREATININE 0.97 01/23/2024   GLUCOSE 103 (H) 01/23/2024   GFRNONAA >60 01/23/2024   CALCIUM  8.7 (L) 01/23/2024   PHOS 3.1 01/23/2024   PROT 7.4 01/16/2024   ALBUMIN 2.6 (L) 01/16/2024   BILITOT 0.6 01/16/2024   ALKPHOS 61 01/16/2024   AST 31 01/16/2024   ALT 17 01/16/2024   ANIONGAP 10 01/23/2024    CBG (last 3)  Recent Labs    01/22/24 2023 01/23/24 0831 01/23/24 1210  GLUCAP 87 101* 98      Coagulation  Profile: No results for input(s): INR, PROTIME in the last 168 hours.   Radiology Studies: I have personally reviewed the imaging studies  No results found.     Nydia Distance M.D. [facility] 01/23/2024, 1:00 PM  Available via Epic secure chat 7am-7pm After 7 pm, please refer to night coverage provider listed on amion.

## 2024-01-24 DIAGNOSIS — Z7189 Other specified counseling: Secondary | ICD-10-CM | POA: Diagnosis not present

## 2024-01-24 DIAGNOSIS — F0390 Unspecified dementia without behavioral disturbance: Secondary | ICD-10-CM | POA: Diagnosis not present

## 2024-01-24 DIAGNOSIS — R6510 Systemic inflammatory response syndrome (SIRS) of non-infectious origin without acute organ dysfunction: Secondary | ICD-10-CM | POA: Diagnosis not present

## 2024-01-24 LAB — GLUCOSE, CAPILLARY
Glucose-Capillary: 88 mg/dL (ref 70–99)
Glucose-Capillary: 110 mg/dL — ABNORMAL HIGH (ref 70–99)
Glucose-Capillary: 83 mg/dL (ref 70–99)
Glucose-Capillary: 102 mg/dL — ABNORMAL HIGH (ref 70–99)

## 2024-01-24 MED ORDER — MUPIROCIN 2 % EX OINT
TOPICAL_OINTMENT | Freq: Two times a day (BID) | CUTANEOUS | Status: DC
  Filled 2024-01-24 (×3): qty 22

## 2024-01-24 NOTE — Progress Notes (Signed)
 Daily Progress Note   Patient Name: Jacob Mcgrath.       Date: 01/24/2024 DOB: 1956-01-31  Age: 68 y.o. MRN#: [id number] Attending Physician: Will Almarie MATSU, MD Primary Care Physician: [name], MD Admit Date: 01/16/2024  Reason for Consultation/Follow-up: Establishing goals of care  Patient Profile/HPI:   Palliative Care consult requested for goals of care discussion in this 68 y.o. male  with past medical history of  diabetes, hypertension, CVA, dementia, depression, status epilepticus. Recent hospitalization on 5/15-5/23 for status epilepticus requiring intubation for seizure control. He was admitted on 01/16/2024 via EMS from [facility] with fever and tremors. Tmax morning of arrival was 103. Patient given Tylenol  per facility.  Patient recently on 14 day antibiotic for diabetic foot ulceration. During work-up noted to have necrotic areas on feet. Imaging negative for osteomyelitis. Started on IV antibiotics. 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   Subjective: Chart reviewed including labs, progress notes, imaging from this and previous encounters.  No labs ordered today. On evaluation patient arouses but does not answer any of my questions or follow any commands. Received call from legal guardian she is awaiting forms that she sent yesterday.   I printed out these forms and placed them on patient's chart for attending physician and vascular surgery to complete.  Guardian requests a statement on the forms stating the surgery that is needed, why it is needed, and what the postop care will be. Discussed with attending provider and vascular surgery.  Review of Systems  Unable to perform ROS: Mental status change      Physical Exam Vitals and nursing note reviewed.  Constitutional:      Appearance: He is ill-appearing.   Cardiovascular:     Rate and Rhythm: Normal rate.  Pulmonary:     Effort: Pulmonary effort is normal.   Neurological:     Mental Status: He is disoriented.             Vital Signs: BP (!) 143/64 (BP Location: Right Arm)   Pulse 89   Temp 97.9 F (36.6 C) (Oral)   Resp 18   Ht 5' 9 (1.753 m)   Wt 65.6 kg   SpO2 93%   BMI 21.36 kg/m  SpO2: SpO2: 93 % O2 Device: O2  Device: Room Air O2 Flow Rate: O2 Flow Rate (L/min): 2 L/min  Intake/output summary:  Intake/Output Summary (Last 24 hours) at 01/24/2024 1318 Last data filed at 01/23/2024 1601 Gross per 24 hour  Intake 320.11 ml  Output --  Net 320.11 ml   LBM: Last BM Date : 01/19/24 Baseline Weight: Weight: 62.3 kg Most recent weight: Weight: 65.6 kg       Palliative Assessment/Data: PPS: 30%      Patient Active Problem List   Diagnosis Date Noted   Ulcer of left foot (HCC) 01/17/2024   SIRS (systemic inflammatory response syndrome) (HCC) 01/16/2024   Decubitus ulcers 01/16/2024   PAD (peripheral artery disease) (HCC) 01/16/2024   Malnutrition of moderate degree 12/10/2023   Hypomagnesemia 08/02/2023   Community acquired pneumonia of right lower lobe of lung 08/02/2023   Dementia without behavioral disturbance (HCC) 08/02/2023   Acute metabolic encephalopathy 08/02/2023   Essential hypertension 08/02/2023   Status epilepticus (HCC) 07/29/2023   Seizure (HCC) 07/02/2023   Stroke (HCC) 07/01/2023   Leukocytosis 07/01/2023   Tremor 02/27/2023   GERD (gastroesophageal reflux disease) 02/27/2023   Depression 02/27/2023   Protein-calorie malnutrition, severe 10/07/2022   Ischemic leg 10/01/2022   Hypertension 10/01/2022   Controlled type 2 diabetes mellitus without complication, without long-term current use of insulin  (HCC) 10/01/2022    Palliative Care Assessment & Plan     Assessment/Recommendations/Plan  Continue current interventions Forms received from legal guardian-need to be completed by 2 separate physicians and to include description of surgery and postop care-I have placed these forms on patient's paper chart so that they can be completed by attending team and surgery-legal guardian cannot proceed with discussion of treatment decisions without having the forms completed   Code Status:   Code Status: Full Code   Prognosis:  Unable to determine  Discharge Planning: To Be Determined  Care plan was discussed with legal guardian- Jacob Mcgrath, attending team, and vascular surgery  Thank you for allowing the Palliative Medicine Team to assist in the care of this patient.  Total time:  60 minutes Prolonged billing:  Time includes:   Preparing to see the patient (e.g., review of tests) Obtaining and/or reviewing separately obtained history Performing a medically necessary appropriate examination and/or evaluation Counseling and educating the patient/family/caregiver Ordering medications, tests, or procedures Referring and communicating with other health care professionals (when not reported separately) Documenting clinical information in the electronic or other health record Independently interpreting results (not reported separately) and communicating results to the patient/family/caregiver Care coordination (not reported separately) Clinical documentation  Cassondra Stain, AGNP-C Palliative Medicine   Please contact Palliative Medicine Team phone at [phone] for questions and concerns.

## 2024-01-24 NOTE — Progress Notes (Signed)
 PROGRESS NOTE    Jacob Mcgrath.  FMW:[id number] DOB: 10/24/1955 DOA: 01/16/2024 PCP: [name], MD   Brief Narrative:  68 year old male with dementia, DM type II, HTN, PAD status post stenting, epilepsy, depression, stroke presented from LTCwith report of fevers, tremors that started 6/23. He apparently had been on a 14 day course of ertapenem at SNF for diabetic foot infection/ulcer. Facility reports fever of 103 this morning, He was given Tylenol  prior to arrival. They note necrotic appearing areas on feet. Workup in the ED reveals Left foot xray > No evidence of osteomyelitis. CT angio shows occluded vasculature. Patient was initiated on IV antibiotics. Patient was admitted for further evaluation.    Assessment & Plan:   Principal Problem:   SIRS (systemic inflammatory response syndrome) (HCC) Active Problems:   Stroke (HCC)   Acute metabolic encephalopathy   Decubitus ulcers   PAD (peripheral artery disease) (HCC)   Controlled type 2 diabetes mellitus without complication, without long-term current use of insulin  (HCC)   Depression   Dementia without behavioral disturbance (HCC)   Essential hypertension   Seizure (HCC)   Ulcer of left foot (HCC)    Sepsis from LLE cellulitis : - Presented with sepsis criteria (tachycardia, leukocytosis, tachypnea, normal lactic acid, source), fever 103 F at SNF,  chronic  left lower extremity wounds which may be etiology of infection.   -Left foot x-ray-no evidence of osteomyelitis.   -UA with trace leukocytes not quite convincing as etiology.  -Continue broad-spectrum IV antibiotics antibiotics, IV vancomycin , cefepime , Flagyl  -Blood cultures no growth, urine culture insignificant growth. -Sepsis physiology improving.   PAD (peripheral artery disease) - severe : -Surgery and vascular surgery consulted at this time -CT angio bifemoral- Occluded left SFA stent extending into the proximal popliteal artery.  Occluded left  posterior tibial artery to the mid calf. Patent left external iliac artery stent. Short-segment high-grade stenosis mid right external iliac artery. - Per vascular surgery, Dr. Sheree, now with nonviable left foot and occluded stents in the LLE, recommended AKA however unable to contact his legal guardian - Palliative medicine consulted for GOC notes reviewed     Decubitus ulcers: Left lower extremity decubitus ulcers-on lateral side.  Unstageable.  Black eschar present.  Possible cellulitis, but due to hyperpigmentation hard to tell, but no fluctuance or swelling appreciated.  Left foot x-ray- No acute abnormality. Continue Broad-spectrum antibiotics for now Per MAR-he had been on a 14 day course of  IV Ertepenem for diabetic foot ulcer.   Essential hypertension: BP stable, continue Coreg     Acute metabolic encephalopathy: Now more awake. Baseline dementia.   Unable to determine severity of patient's baseline dementia.    Continue IV antibiotics   Dementia without behavioral disturbance ; Delirium precautions Palliative medicine following   Epilepsy: Continue oral Keppra .   Controlled type 2 diabetes mellitus: Hypoglycemia .  - HgbA1c - 5.9%   CBG (last 3)   Pressure Injury 10/04/22 Sacrum Medial Stage 1 -  Intact skin with non-blanchable redness of a localized area usually over a bony prominence. (Active)  10/04/22 2011  Location: Sacrum  Location Orientation: Medial  Staging: Stage 1 -  Intact skin with non-blanchable redness of a localized area usually over a bony prominence.  Wound Description (Comments):   DO NOT USE:  Present on Admission:     Estimated body mass index is 21.36 kg/m as calculated from the following:   Height as of this encounter: 5' 9 (1.753 m).   Weight  as of this encounter: 65.6 kg.  DVT prophylaxis: lovenox  Code Status: full Family Communication:none Disposition Plan:  Status is: Inpatient Remains inpatient appropriate because: awaiting  decision on L AKA   Consultants:  PALLIATIVE  vascular  Procedures:none Antimicrobials:  Anti-infectives (From admission, onward)    Start     Dose/Rate Route Frequency Ordered Stop   01/23/24 1000  vancomycin  (VANCOREADY) IVPB 500 mg/100 mL        500 mg 100 mL/hr over 60 Minutes Intravenous Every 12 hours 01/22/24 2234     01/21/24 2200  metroNIDAZOLE  (FLAGYL ) tablet 500 mg        500 mg Oral Every 12 hours 01/21/24 1447     01/17/24 0800  vancomycin  (VANCOREADY) IVPB 750 mg/150 mL  Status:  Discontinued        750 mg 150 mL/hr over 60 Minutes Intravenous Every 12 hours 01/16/24 2112 01/22/24 2234   01/16/24 2200  metroNIDAZOLE  (FLAGYL ) IVPB 500 mg  Status:  Discontinued        500 mg 100 mL/hr over 60 Minutes Intravenous Every 12 hours 01/16/24 2104 01/21/24 1447   01/16/24 2200  ceFEPIme  (MAXIPIME ) 2 g in sodium chloride  0.9 % 100 mL IVPB        2 g 200 mL/hr over 30 Minutes Intravenous Every 8 hours 01/16/24 2107     01/16/24 2200  vancomycin  (VANCOREADY) IVPB 500 mg/100 mL        500 mg 100 mL/hr over 60 Minutes Intravenous  Once 01/16/24 2108 01/17/24 0727   01/16/24 1500  vancomycin  (VANCOCIN ) IVPB 1000 mg/200 mL premix        1,000 mg 200 mL/hr over 60 Minutes Intravenous  Once 01/16/24 1449 01/16/24 1618   01/16/24 1245  cefTRIAXone  (ROCEPHIN ) 1 g in sodium chloride  0.9 % 100 mL IVPB        1 g 200 mL/hr over 30 Minutes Intravenous  Once 01/16/24 1239 01/16/24 1436       Subjective: No events overnight Pain in the left foot  Objective: Vitals:   01/23/24 1620 01/23/24 2004 01/24/24 0436 01/24/24 0808  BP: (!) 105/50 (!) 106/56 (!) 146/64 (!) 143/64  Pulse: 96 97 95 89  Resp: 18 20 19 18   Temp:  98.8 F (37.1 C) 98.2 F (36.8 C) 97.9 F (36.6 C)  TempSrc:   Oral Oral  SpO2: 96% 98% 90% 93%  Weight:      Height:        Intake/Output Summary (Last 24 hours) at 01/24/2024 1000 Last data filed at 01/23/2024 1601 Gross per 24 hour  Intake 438.11 ml   Output --  Net 438.11 ml   Filed Weights   01/16/24 1223 01/16/24 2000  Weight: 62.3 kg 65.6 kg    Examination:  General exam: Appears in distress when trying to move left foot Respiratory system: Clear to auscultation. Respiratory effort normal. Cardiovascular system: reg  Gastrointestinal system: Abdomen is nondistended, soft and nontender. No organomegaly or masses felt. Normal bowel sounds heard. Central nervous system: awake oriented to hospital Extremities:no edema     Data Reviewed: I have personally reviewed following labs and imaging studies  CBC: Recent Labs  Lab 01/19/24 0404 01/20/24 0526 01/21/24 1044 01/23/24 0429  WBC 14.2* 13.7* 14.5* 14.4*  HGB 8.6* 8.0* 7.4* 8.1*  HCT 26.7* 25.3* 23.2* 25.6*  MCV 89.9 91.3 92.1 90.8  PLT 302 330 331 503*   Basic Metabolic Panel: Recent Labs  Lab 01/18/24 0848 01/19/24 0404 01/20/24 0526  01/21/24 0503 01/23/24 0429  NA  --  140  --   --  138  K  --  3.7  --   --  3.8  CL  --  110  --   --  107  CO2  --  19*  --   --  21*  GLUCOSE  --  112*  --   --  103*  BUN  --  13  --   --  16  CREATININE 0.88 0.89 0.93  --  0.97  CALCIUM   --  8.4*  --   --  8.7*  MG  --  1.8  --  1.9 1.8  PHOS  --  3.6  --  3.7 3.1   GFR: Estimated Creatinine Clearance: 68.6 mL/min (by C-G formula based on SCr of 0.97 mg/dL). Liver Function Tests: No results for input(s): AST, ALT, ALKPHOS, BILITOT, PROT, ALBUMIN in the last 168 hours. No results for input(s): LIPASE, AMYLASE in the last 168 hours. No results for input(s): AMMONIA in the last 168 hours. Coagulation Profile: No results for input(s): INR, PROTIME in the last 168 hours. Cardiac Enzymes: No results for input(s): CKTOTAL, CKMB, CKMBINDEX, TROPONINI in the last 168 hours. BNP (last 3 results) No results for input(s): PROBNP in the last 8760 hours. HbA1C: No results for input(s): HGBA1C in the last 72 hours. CBG: Recent Labs  Lab  01/23/24 0831 01/23/24 1210 01/23/24 1618 01/23/24 2005 01/24/24 0808  GLUCAP 101* 98 122* 157* 88   Lipid Profile: No results for input(s): CHOL, HDL, LDLCALC, TRIG, CHOLHDL, LDLDIRECT in the last 72 hours. Thyroid Function Tests: No results for input(s): TSH, T4TOTAL, FREET4, T3FREE, THYROIDAB in the last 72 hours. Anemia Panel: No results for input(s): VITAMINB12, FOLATE, FERRITIN, TIBC, IRON, RETICCTPCT in the last 72 hours. Sepsis Labs: No results for input(s): PROCALCITON, LATICACIDVEN in the last 168 hours.  Recent Results (from the past 240 hours)  Blood Culture (routine x 2)     Status: None   Collection Time: 01/16/24 12:44 PM   Specimen: BLOOD  Result Value Ref Range Status   Specimen Description BLOOD BLOOD RIGHT ARM  Final   Special Requests   Final    BOTTLES DRAWN AEROBIC AND ANAEROBIC Blood Culture results may not be optimal due to an inadequate volume of blood received in culture bottles   Culture   Final    NO GROWTH 5 DAYS Performed at [facility], [address], [place], [place] [address]    Report Status 01/21/2024 FINAL  Final  Blood Culture (routine x 2)     Status: None   Collection Time: 01/16/24 12:55 PM   Specimen: BLOOD  Result Value Ref Range Status   Specimen Description BLOOD LEFT ASSIST CONTROL  Final   Special Requests   Final    BOTTLES DRAWN AEROBIC AND ANAEROBIC Blood Culture adequate volume   Culture   Final    NO GROWTH 5 DAYS Performed at [facility], [address] [place], [place] [address]    Report Status 01/21/2024 FINAL  Final  Resp panel by RT-PCR (RSV, Flu A&B, Covid) Anterior Nasal Swab     Status: None   Collection Time: 01/16/24  1:07 PM   Specimen: Anterior Nasal Swab  Result Value Ref Range Status   SARS Coronavirus 2 by RT PCR NEGATIVE NEGATIVE Final    Comment: (NOTE) SARS-CoV-2 target nucleic acids are NOT DETECTED.  The SARS-CoV-2 RNA is generally detectable in upper  respiratory  specimens during the acute phase of infection. The lowest concentration of SARS-CoV-2 viral copies this assay can detect is 138 copies/mL. A negative result does not preclude SARS-Cov-2 infection and should not be used as the sole basis for treatment or other patient management decisions. A negative result may occur with  improper specimen collection/handling, submission of specimen other than nasopharyngeal swab, presence of viral mutation(s) within the areas targeted by this assay, and inadequate number of viral copies(<138 copies/mL). A negative result must be combined with clinical observations, patient history, and epidemiological information. The expected result is Negative.  Fact Sheet for Patients:  BloggerCourse.com  Fact Sheet for Healthcare Providers:  SeriousBroker.it  This test is no t yet approved or cleared by the [place]  FDA and  has been authorized for detection and/or diagnosis of SARS-CoV-2 by FDA under an Emergency Use Authorization (EUA). This EUA will remain  in effect (meaning this test can be used) for the duration of the COVID-19 declaration under Section 564(b)(1) of the Act, 21 U.S.C.section 360bbb-3(b)(1), unless the authorization is terminated  or revoked sooner.       Influenza A by PCR NEGATIVE NEGATIVE Final   Influenza B by PCR NEGATIVE NEGATIVE Final    Comment: (NOTE) The Xpert Xpress SARS-CoV-2/FLU/RSV plus assay is intended as an aid in the diagnosis of influenza from Nasopharyngeal swab specimens and should not be used as a sole basis for treatment. Nasal washings and aspirates are unacceptable for Xpert Xpress SARS-CoV-2/FLU/RSV testing.  Fact Sheet for Patients: BloggerCourse.com  Fact Sheet for Healthcare Providers: SeriousBroker.it  This test is not yet approved or cleared by the [place]  FDA and has been  authorized for detection and/or diagnosis of SARS-CoV-2 by FDA under an Emergency Use Authorization (EUA). This EUA will remain in effect (meaning this test can be used) for the duration of the COVID-19 declaration under Section 564(b)(1) of the Act, 21 U.S.C. section 360bbb-3(b)(1), unless the authorization is terminated or revoked.     Resp Syncytial Virus by PCR NEGATIVE NEGATIVE Final    Comment: (NOTE) Fact Sheet for Patients: BloggerCourse.com  Fact Sheet for Healthcare Providers: SeriousBroker.it  This test is not yet approved or cleared by the [place]  FDA and has been authorized for detection and/or diagnosis of SARS-CoV-2 by FDA under an Emergency Use Authorization (EUA). This EUA will remain in effect (meaning this test can be used) for the duration of the COVID-19 declaration under Section 564(b)(1) of the Act, 21 U.S.C. section 360bbb-3(b)(1), unless the authorization is terminated or revoked.  Performed at [facility], [address] [place], [place] [address]   Urine Culture     Status: Abnormal   Collection Time: 01/16/24  1:42 PM   Specimen: Urine, Random  Result Value Ref Range Status   Specimen Description   Final    URINE, RANDOM Performed at [facility], [address] [place], [place] [address]    Special Requests   Final    NONE Reflexed from [id number] Performed at [facility], [address] [place], [place] [address]    Culture (A)  Final    <10,000 COLONIES/mL INSIGNIFICANT GROWTH Performed at [facility], [address] [place], [place] [address]    Report Status 01/17/2024 FINAL  Final  MRSA Next Gen by PCR, Nasal     Status: Abnormal   Collection Time: 01/16/24  7:40 PM   Specimen: Nasal Mucosa; Nasal Swab  Result Value Ref Range Status   MRSA by PCR Next  Gen DETECTED (A) NOT DETECTED Final    Comment: RESULT CALLED TO, READ BACK BY AND VERIFIED WITH: VELMA LINGER, RN AT 2136  01/16/24 BY A. SNYDER (NOTE) The GeneXpert MRSA Assay (FDA approved for NASAL specimens only), is one component of a comprehensive MRSA colonization surveillance program. It is not intended to diagnose MRSA infection nor to guide or monitor treatment for MRSA infections. Test performance is not FDA approved in patients less than 83 years old. Performed at [facility], [address] [place], [place] [address]          Radiology Studies: No results found.   Scheduled Meds:  (feeding supplement) PROSource Plus  30 mL Oral BID BM   aspirin  EC  81 mg Oral Daily   atorvastatin   80 mg Oral QHS   baclofen   5 mg Oral TID   carvedilol   6.25 mg Oral BID WC   Chlorhexidine  Gluconate Cloth  6 each Topical Daily   clopidogrel   75 mg Oral Q breakfast   DULoxetine   30 mg Oral Daily   enoxaparin  (LOVENOX ) injection  40 mg Subcutaneous Q24H   levETIRAcetam   1,500 mg Oral BID   melatonin  6 mg Oral QHS   metroNIDAZOLE   500 mg Oral Q12H   multivitamin with minerals  1 tablet Oral Daily   mupirocin  ointment   Nasal BID   pantoprazole   40 mg Oral Daily   PARoxetine   40 mg Oral Daily   tamsulosin   0.4 mg Oral Daily   valproic  acid  750 mg Oral BID   Continuous Infusions:  ceFEPime  (MAXIPIME ) IV 2 g (01/24/24 0600)   vancomycin  500 mg (01/24/24 0951)     LOS: 8 days    Time spent: 38 min Almarie KANDICE Hoots, MD 01/24/2024, 10:00 AM

## 2024-01-24 NOTE — Plan of Care (Signed)

## 2024-01-25 DIAGNOSIS — G9341 Metabolic encephalopathy: Secondary | ICD-10-CM | POA: Diagnosis not present

## 2024-01-25 DIAGNOSIS — R6510 Systemic inflammatory response syndrome (SIRS) of non-infectious origin without acute organ dysfunction: Secondary | ICD-10-CM | POA: Diagnosis not present

## 2024-01-25 DIAGNOSIS — F0390 Unspecified dementia without behavioral disturbance: Secondary | ICD-10-CM | POA: Diagnosis not present

## 2024-01-25 DIAGNOSIS — I739 Peripheral vascular disease, unspecified: Secondary | ICD-10-CM | POA: Diagnosis not present

## 2024-01-25 DIAGNOSIS — Z7189 Other specified counseling: Secondary | ICD-10-CM | POA: Diagnosis not present

## 2024-01-25 LAB — CREATININE, SERUM
Creatinine, Ser: 0.85 mg/dL (ref 0.61–1.24)
GFR, Estimated: >60 mL/min (ref >60)

## 2024-01-25 LAB — GLUCOSE, CAPILLARY
Glucose-Capillary: 91 mg/dL (ref 70–99)
Glucose-Capillary: 132 mg/dL — ABNORMAL HIGH (ref 70–99)
Glucose-Capillary: 78 mg/dL (ref 70–99)
Glucose-Capillary: 156 mg/dL — ABNORMAL HIGH (ref 70–99)

## 2024-01-25 NOTE — Progress Notes (Signed)
 Patient ID: Samer Dutton., male   DOB: 01-05-1956, 68 y.o.   MRN: [id number]  DNR / care recommendation form submitted to patient's caregivers. We remain available to perform above knee amputation. Will await caregiver's decision. Will not actively follow over the weekend. Please call for questions.   Debby SAILOR. Magda, MD [facility] Phone Number: [phone] 01/25/2024 11:15 AM

## 2024-01-25 NOTE — Progress Notes (Signed)
 Daily Progress Note   Patient Name: Jacob Mcgrath.       Date: 01/25/2024 DOB: 11-Jan-1956  Age: 68 y.o. MRN#: [id number] Attending Physician: Patsy Lenis, MD Primary Care Physician: [name], MD Admit Date: 01/16/2024  Reason for Consultation/Follow-up: Establishing goals of care  Patient Profile/HPI:   Palliative Care consult requested for goals of care discussion in this 68 y.o. male  with past medical history of  diabetes, hypertension, CVA, dementia, depression, status epilepticus. Recent hospitalization on 5/15-5/23 for status epilepticus requiring intubation for seizure control. He was admitted on 01/16/2024 via EMS from [facility] with fever and tremors. Tmax morning of arrival was 103. Patient given Tylenol  per facility.  Patient recently on 14 day antibiotic for diabetic foot ulceration. During work-up noted to have necrotic areas on feet. Imaging negative for osteomyelitis. Started on IV antibiotics. 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   Subjective: Chart reviewed including labs, progress notes, imaging from this and previous encounters.  On evaluation patient arouses but does not answer any of my questions. He does follow some commands.     Review of Systems  Unable to perform ROS: Mental status change     Physical Exam Vitals and nursing note reviewed.  Constitutional:      Appearance: He is ill-appearing.  Cardiovascular:     Rate and Rhythm: Normal rate.  Pulmonary:     Effort: Pulmonary effort is normal.  Neurological:     Mental Status: He is disoriented.             Vital Signs: BP 133/83 (BP Location: Right Arm)   Pulse (!) 57   Temp 97.6 F (36.4 C)   Resp 17   Ht 5' 9 (1.753  m)   Wt 65.6 kg   SpO2 98%   BMI 21.36 kg/m  SpO2: SpO2: 98 % O2 Device: O2 Device: Room Air O2 Flow Rate: O2 Flow Rate (L/min): 2 L/min  Intake/output summary:  Intake/Output Summary (Last 24 hours) at 01/25/2024 1521 Last data filed at 01/25/2024 1115 Gross per 24 hour  Intake --  Output 400 ml  Net -400 ml   LBM: Last BM Date : 01/19/24 Baseline Weight: Weight: 62.3 kg Most recent weight: Weight: 65.6 kg  Palliative Assessment/Data: PPS: 30%      Patient Active Problem List   Diagnosis Date Noted   Ulcer of left foot (HCC) 01/17/2024   Decubitus ulcers 01/16/2024   PAD (peripheral artery disease) (HCC) 01/16/2024   Malnutrition of moderate degree 12/10/2023   Hypomagnesemia 08/02/2023   Community acquired pneumonia of right lower lobe of lung 08/02/2023   Dementia without behavioral disturbance (HCC) 08/02/2023   Essential hypertension 08/02/2023   Status epilepticus (HCC) 07/29/2023   Seizure disorder (HCC) 07/02/2023   Stroke (HCC) 07/01/2023   Leukocytosis 07/01/2023   Tremor 02/27/2023   GERD (gastroesophageal reflux disease) 02/27/2023   Depression 02/27/2023   Protein-calorie malnutrition, severe 10/07/2022   Ischemic leg 10/01/2022   Hypertension 10/01/2022   Controlled type 2 diabetes mellitus without complication, without long-term current use of insulin  (HCC) 10/01/2022    Palliative Care Assessment & Plan    Assessment/Recommendations/Plan  Continue current interventions Completed forms received and emailed to [name]- patient's guardian at [facility]    Code Status:   Code Status: Full Code   Prognosis:  Unable to determine  Discharge Planning: To Be Determined  Care plan was discussed with legal guardian- [name], attending team, and vascular surgery  Thank you for allowing the Palliative Medicine Team to assist in the care of this patient.  Total time:  45 minutes Prolonged billing:  Time includes:   Preparing to see  the patient (e.g., review of tests) Obtaining and/or reviewing separately obtained history Performing a medically necessary appropriate examination and/or evaluation Counseling and educating the patient/family/caregiver Ordering medications, tests, or procedures Referring and communicating with other health care professionals (when not reported separately) Documenting clinical information in the electronic or other health record Independently interpreting results (not reported separately) and communicating results to the patient/family/caregiver Care coordination (not reported separately) Clinical documentation  Cassondra Stain, AGNP-C Palliative Medicine   Please contact Palliative Medicine Team phone at [phone] for questions and concerns.

## 2024-01-25 NOTE — Progress Notes (Signed)
 Progress Note    Jacob Mcgrath.   FMW:[id number]  DOB: March 19, 1956  DOA: 01/16/2024     9 PCP: [name], MD  Initial CC: Foot wound  Hospital Course: 68 y.o. male with medical history significant for dementia, type 2 diabetes mellitus, hypertension, PAD s/p stenting, epilepsy, depression, stroke presented from LTC with report of fevers, tremors that started 6/23. He apparently had been on a 14 day course of ertapenem at SNF for diabetic foot infection/ulcer. He was noted to have fever prior to admission with necrotic appearance on foot. CT angio showed occluded left SFA stent extending into the proximal popliteal artery, occluded left posterior tibial artery to the mid calf amongst other findings.  Patient was admitted for further evaluation.     Assessment & Plan:   Sepsis from LLE cellulitis - Presented with sepsis criteria (tachycardia, leukocytosis, tachypnea, normal lactic acid, source), fever 103 F at SNF,  chronic  left lower extremity wounds which may be etiology of infection.   -Left foot x-ray-no evidence of osteomyelitis.   -UA with trace leukocytes not quite convincing as etiology.  -Continue broad-spectrum IV antibiotics antibiotics, IV vancomycin , cefepime , Flagyl  -Blood cultures no growth, urine culture insignificant growth.   PAD (peripheral artery disease) - severe -Surgery and vascular surgery consulted -CT angio bifemoral- Occluded left SFA stent extending into the proximal popliteal artery.  Occluded left posterior tibial artery to the mid calf. Patent left external iliac artery stent. Short-segment high-grade stenosis mid right external iliac artery. - Per vascular surgery, now with nonviable left foot and occluded stents in the LLE, recommended AKA - Palliative medicine consulted and helping assist for GOC/amputation    Decubitus ulcers Left lower extremity decubitus ulcers-on lateral side.  Unstageable.  Black eschar present.  Possible  cellulitis, but due to hyperpigmentation hard to tell, but no fluctuance or swelling appreciated.  Left foot x-ray- No acute abnormality. Continue Broad-spectrum antibiotics for now Per MAR-he had been on a 14 day course of  IV Ertepenem for diabetic foot ulcer   Essential hypertension - continue Coreg     Acute metabolic encephalopathy Now more awake. Baseline dementia.  Unable to determine severity of patient's baseline dementia Continue IV antibiotics   Dementia without behavioral disturbance Delirium precautions Palliative medicine following   Epilepsy Continue oral Keppra    Controlled type 2 diabetes mellitus - HgbA1c - 5.9%    Pressure Injury 10/04/22 Sacrum Medial Stage 1 -  Intact skin with non-blanchable redness of a localized area usually over a bony prominence. (Active)  10/04/22 2011  Location: Sacrum  Location Orientation: Medial  Staging: Stage 1 -  Intact skin with non-blanchable redness of a localized area usually over a bony prominence.  Wound Description (Comments):   DO NOT USE:  Present on Admission:     Interval History:  No events overnight.  Cooperative but does not engage much in conversation.  Able to follow commands but not talking.   Old records reviewed in assessment of this patient  Antimicrobials: Vancomycin  01/16/2024 >> current Flagyl  01/16/2024 >> current Cefepime  01/16/2024 >> current  DVT prophylaxis:  enoxaparin  (LOVENOX ) injection 40 mg Start: 01/16/24 2130   Code Status:   Code Status: Full Code  Mobility Assessment (Last 72 Hours)     Mobility Assessment     Row Name 01/25/24 0900 01/24/24 2000 01/24/24 0929 01/23/24 2251 01/23/24 0810   Does patient have an order for bedrest or is patient medically unstable No - Continue assessment No - Continue assessment  No - Continue assessment No - Continue assessment No - Continue assessment   What is the highest level of mobility based on the progressive mobility assessment? Level 2  (Chairfast) - Balance while sitting on edge of bed and cannot stand Level 2 (Chairfast) - Balance while sitting on edge of bed and cannot stand Level 2 (Chairfast) - Balance while sitting on edge of bed and cannot stand Level 2 (Chairfast) - Balance while sitting on edge of bed and cannot stand Level 2 (Chairfast) - Balance while sitting on edge of bed and cannot stand   Is the above level different from baseline mobility prior to current illness? Yes - Recommend PT order -- -- -- --    Row Name 01/22/24 2230           Does patient have an order for bedrest or is patient medically unstable No - Continue assessment       What is the highest level of mobility based on the progressive mobility assessment? Level 2 (Chairfast) - Balance while sitting on edge of bed and cannot stand       Is the above level different from baseline mobility prior to current illness? No - Consider discontinuing PT/OT          Barriers to discharge: TBD Disposition Plan:  TBD HH orders placed: n/a Status is: Inpt  Objective: Blood pressure 133/83, pulse (!) 57, temperature 97.6 F (36.4 C), resp. rate 17, height 5' 9 (1.753 m), weight 65.6 kg, SpO2 98%.  Examination:  Physical Exam Constitutional:      General: He is not in acute distress.    Comments: Chronically ill-appearing adult man appearing older than stated age lying in bed in no distress  HENT:     Head: Normocephalic and atraumatic.     Mouth/Throat:     Mouth: Mucous membranes are moist.  Eyes:     Extraocular Movements: Extraocular movements intact.  Cardiovascular:     Rate and Rhythm: Normal rate and regular rhythm.  Pulmonary:     Effort: Pulmonary effort is normal. No respiratory distress.     Breath sounds: Normal breath sounds. No wheezing.  Abdominal:     General: Bowel sounds are normal. There is no distension.     Palpations: Abdomen is soft.     Tenderness: There is no abdominal tenderness.  Musculoskeletal:        General:  Normal range of motion.     Cervical back: Normal range of motion and neck supple.  Skin:    General: Skin is warm and dry.  Neurological:     Comments: Slowed mentation and underlying dementia appreciated      Consultants:  Palliative care Vascular surgery  Procedures:    Data Reviewed: Results for orders placed or performed during the hospital encounter of 01/16/24 (from the past 24 hours)  Glucose, capillary     Status: None   Collection Time: 01/24/24  3:43 PM  Result Value Ref Range   Glucose-Capillary 83 70 - 99 mg/dL  Glucose, capillary     Status: Abnormal   Collection Time: 01/24/24  8:14 PM  Result Value Ref Range   Glucose-Capillary 102 (H) 70 - 99 mg/dL  Creatinine, serum     Status: None   Collection Time: 01/25/24  4:24 AM  Result Value Ref Range   Creatinine, Ser 0.85 0.61 - 1.24 mg/dL   GFR, Estimated >39 >39 mL/min  Glucose, capillary     Status: None  Collection Time: 01/25/24  8:10 AM  Result Value Ref Range   Glucose-Capillary 91 70 - 99 mg/dL  Glucose, capillary     Status: Abnormal   Collection Time: 01/25/24 11:53 AM  Result Value Ref Range   Glucose-Capillary 132 (H) 70 - 99 mg/dL    I have reviewed pertinent nursing notes, vitals, labs, and images as necessary. I have ordered labwork to follow up on as indicated.  I have reviewed the last notes from staff over past 24 hours. I have discussed patient's care plan and test results with nursing staff, CM/SW, and other staff as appropriate.  Time spent: Greater than 50% of the 55 minute visit was spent in counseling/coordination of care for the patient as laid out in the A&P.   LOS: 9 days   Alm Apo, MD [facility] 01/25/2024, 1:11 PM

## 2024-01-25 NOTE — Plan of Care (Signed)
  Problem: Education: Goal: Knowledge of General Education information will improve Description: Including pain rating scale, medication(s)/side effects and non-pharmacologic comfort measures Outcome: Progressing   Problem: Health Behavior/Discharge Planning: Goal: Ability to manage health-related needs will improve Outcome: Progressing   Problem: Clinical Measurements: Goal: Ability to maintain clinical measurements within normal limits will improve Outcome: Progressing Goal: Will remain free from infection Outcome: Progressing Goal: Diagnostic test results will improve Outcome: Progressing Goal: Respiratory complications will improve Outcome: Progressing Goal: Cardiovascular complication will be avoided Outcome: Progressing   Problem: Activity: Goal: Risk for activity intolerance will decrease Outcome: Progressing   Problem: Nutrition: Goal: Adequate nutrition will be maintained Outcome: Progressing   Problem: Coping: Goal: Level of anxiety will decrease Outcome: Progressing   Problem: Elimination: Goal: Will not experience complications related to bowel motility Outcome: Progressing Goal: Will not experience complications related to urinary retention Outcome: Progressing   Problem: Pain Managment: Goal: General experience of comfort will improve and/or be controlled Outcome: Progressing   Problem: Safety: Goal: Ability to remain free from injury will improve Outcome: Progressing   Problem: Skin Integrity: Goal: Risk for impaired skin integrity will decrease Outcome: Progressing   Problem: Education: Goal: Ability to describe self-care measures that may prevent or decrease complications (Diabetes Survival Skills Education) will improve Outcome: Progressing Goal: Individualized Educational Video(s) Outcome: Progressing   Problem: Coping: Goal: Ability to adjust to condition or change in health will improve Outcome: Progressing   Problem: Fluid  Volume: Goal: Ability to maintain a balanced intake and output will improve Outcome: Progressing   Problem: Health Behavior/Discharge Planning: Goal: Ability to identify and utilize available resources and services will improve Outcome: Progressing Goal: Ability to manage health-related needs will improve Outcome: Progressing   Problem: Metabolic: Goal: Ability to maintain appropriate glucose levels will improve Outcome: Progressing   Problem: Nutritional: Goal: Maintenance of adequate nutrition will improve Outcome: Progressing Goal: Progress toward achieving an optimal weight will improve Outcome: Progressing   Problem: Skin Integrity: Goal: Risk for impaired skin integrity will decrease Outcome: Progressing   Problem: Tissue Perfusion: Goal: Adequacy of tissue perfusion will improve Outcome: Progressing   Problem: Education: Goal: Knowledge of General Education information will improve Description: Including pain rating scale, medication(s)/side effects and non-pharmacologic comfort measures Outcome: Progressing   Problem: Health Behavior/Discharge Planning: Goal: Ability to manage health-related needs will improve Outcome: Progressing   Problem: Clinical Measurements: Goal: Ability to maintain clinical measurements within normal limits will improve Outcome: Progressing Goal: Will remain free from infection Outcome: Progressing Goal: Diagnostic test results will improve Outcome: Progressing Goal: Respiratory complications will improve Outcome: Progressing Goal: Cardiovascular complication will be avoided Outcome: Progressing   Problem: Activity: Goal: Risk for activity intolerance will decrease Outcome: Progressing   Problem: Nutrition: Goal: Adequate nutrition will be maintained Outcome: Progressing   Problem: Coping: Goal: Level of anxiety will decrease Outcome: Progressing   Problem: Elimination: Goal: Will not experience complications related to  bowel motility Outcome: Progressing Goal: Will not experience complications related to urinary retention Outcome: Progressing   Problem: Pain Managment: Goal: General experience of comfort will improve and/or be controlled Outcome: Progressing   Problem: Safety: Goal: Ability to remain free from injury will improve Outcome: Progressing   Problem: Skin Integrity: Goal: Risk for impaired skin integrity will decrease Outcome: Progressing

## 2024-01-26 DIAGNOSIS — Z7189 Other specified counseling: Secondary | ICD-10-CM | POA: Diagnosis not present

## 2024-01-26 DIAGNOSIS — L97529 Non-pressure chronic ulcer of other part of left foot with unspecified severity: Secondary | ICD-10-CM | POA: Diagnosis not present

## 2024-01-26 DIAGNOSIS — F0390 Unspecified dementia without behavioral disturbance: Secondary | ICD-10-CM | POA: Diagnosis not present

## 2024-01-26 DIAGNOSIS — I739 Peripheral vascular disease, unspecified: Secondary | ICD-10-CM | POA: Diagnosis not present

## 2024-01-26 DIAGNOSIS — A419 Sepsis, unspecified organism: Secondary | ICD-10-CM | POA: Diagnosis not present

## 2024-01-26 DIAGNOSIS — Z515 Encounter for palliative care: Secondary | ICD-10-CM | POA: Diagnosis not present

## 2024-01-26 LAB — CBC WITH DIFFERENTIAL/PLATELET
Abs Immature Granulocytes: 0.18 10*3/uL — ABNORMAL HIGH (ref 0.00–0.07)
Basophils Absolute: 0.1 10*3/uL (ref 0.0–0.1)
Basophils Relative: 0 %
Eosinophils Absolute: 0.2 10*3/uL (ref 0.0–0.5)
Eosinophils Relative: 2 %
HCT: 27.4 % — ABNORMAL LOW (ref 39.0–52.0)
Hemoglobin: 8.7 g/dL — ABNORMAL LOW (ref 13.0–17.0)
Immature Granulocytes: 1 %
Lymphocytes Relative: 26 %
Lymphs Abs: 4 10*3/uL (ref 0.7–4.0)
MCH: 29.1 pg (ref 26.0–34.0)
MCHC: 31.8 g/dL (ref 30.0–36.0)
MCV: 91.6 fL (ref 80.0–100.0)
Monocytes Absolute: 2.2 10*3/uL — ABNORMAL HIGH (ref 0.1–1.0)
Monocytes Relative: 14 %
Neutro Abs: 9 10*3/uL — ABNORMAL HIGH (ref 1.7–7.7)
Neutrophils Relative %: 57 %
Platelets: 671 10*3/uL — ABNORMAL HIGH (ref 150–400)
RBC: 2.99 MIL/uL — ABNORMAL LOW (ref 4.22–5.81)
RDW: 15 % (ref 11.5–15.5)
Smear Review: NORMAL
WBC: 15.8 10*3/uL — ABNORMAL HIGH (ref 4.0–10.5)
nRBC: 0 % (ref 0.0–0.2)

## 2024-01-26 LAB — BASIC METABOLIC PANEL WITH GFR
Anion gap: 9 (ref 5–15)
BUN: 18 mg/dL (ref 8–23)
CO2: 21 mmol/L — ABNORMAL LOW (ref 22–32)
Calcium: 9.1 mg/dL (ref 8.9–10.3)
Chloride: 109 mmol/L (ref 98–111)
Creatinine, Ser: 0.93 mg/dL (ref 0.61–1.24)
GFR, Estimated: >60 mL/min (ref >60)
Glucose, Bld: 127 mg/dL — ABNORMAL HIGH (ref 70–99)
Potassium: 3.9 mmol/L (ref 3.5–5.1)
Sodium: 139 mmol/L (ref 135–145)

## 2024-01-26 LAB — GLUCOSE, CAPILLARY
Glucose-Capillary: 83 mg/dL (ref 70–99)
Glucose-Capillary: 140 mg/dL — ABNORMAL HIGH (ref 70–99)
Glucose-Capillary: 99 mg/dL (ref 70–99)
Glucose-Capillary: 114 mg/dL — ABNORMAL HIGH (ref 70–99)

## 2024-01-26 LAB — MAGNESIUM: Magnesium: 1.8 mg/dL (ref 1.7–2.4)

## 2024-01-26 NOTE — Plan of Care (Signed)
  Problem: Education: Goal: Knowledge of General Education information will improve Description: Including pain rating scale, medication(s)/side effects and non-pharmacologic comfort measures Outcome: Progressing   Problem: Health Behavior/Discharge Planning: Goal: Ability to manage health-related needs will improve Outcome: Progressing   Problem: Clinical Measurements: Goal: Ability to maintain clinical measurements within normal limits will improve Outcome: Progressing Goal: Will remain free from infection Outcome: Progressing Goal: Diagnostic test results will improve Outcome: Progressing Goal: Respiratory complications will improve Outcome: Progressing Goal: Cardiovascular complication will be avoided Outcome: Progressing   Problem: Activity: Goal: Risk for activity intolerance will decrease Outcome: Progressing   Problem: Nutrition: Goal: Adequate nutrition will be maintained Outcome: Progressing   Problem: Coping: Goal: Level of anxiety will decrease Outcome: Progressing   Problem: Elimination: Goal: Will not experience complications related to bowel motility Outcome: Progressing Goal: Will not experience complications related to urinary retention Outcome: Progressing   Problem: Pain Managment: Goal: General experience of comfort will improve and/or be controlled Outcome: Progressing   Problem: Safety: Goal: Ability to remain free from injury will improve Outcome: Progressing   Problem: Skin Integrity: Goal: Risk for impaired skin integrity will decrease Outcome: Progressing   Problem: Education: Goal: Ability to describe self-care measures that may prevent or decrease complications (Diabetes Survival Skills Education) will improve Outcome: Progressing Goal: Individualized Educational Video(s) Outcome: Progressing   Problem: Coping: Goal: Ability to adjust to condition or change in health will improve Outcome: Progressing   Problem: Fluid  Volume: Goal: Ability to maintain a balanced intake and output will improve Outcome: Progressing   Problem: Health Behavior/Discharge Planning: Goal: Ability to identify and utilize available resources and services will improve Outcome: Progressing Goal: Ability to manage health-related needs will improve Outcome: Progressing   Problem: Metabolic: Goal: Ability to maintain appropriate glucose levels will improve Outcome: Progressing   Problem: Nutritional: Goal: Maintenance of adequate nutrition will improve Outcome: Progressing Goal: Progress toward achieving an optimal weight will improve Outcome: Progressing   Problem: Skin Integrity: Goal: Risk for impaired skin integrity will decrease Outcome: Progressing   Problem: Tissue Perfusion: Goal: Adequacy of tissue perfusion will improve Outcome: Progressing   Problem: Education: Goal: Knowledge of General Education information will improve Description: Including pain rating scale, medication(s)/side effects and non-pharmacologic comfort measures Outcome: Progressing   Problem: Health Behavior/Discharge Planning: Goal: Ability to manage health-related needs will improve Outcome: Progressing   Problem: Clinical Measurements: Goal: Ability to maintain clinical measurements within normal limits will improve Outcome: Progressing Goal: Will remain free from infection Outcome: Progressing Goal: Diagnostic test results will improve Outcome: Progressing Goal: Respiratory complications will improve Outcome: Progressing Goal: Cardiovascular complication will be avoided Outcome: Progressing   Problem: Activity: Goal: Risk for activity intolerance will decrease Outcome: Progressing   Problem: Nutrition: Goal: Adequate nutrition will be maintained Outcome: Progressing   Problem: Coping: Goal: Level of anxiety will decrease Outcome: Progressing   Problem: Elimination: Goal: Will not experience complications related to  bowel motility Outcome: Progressing Goal: Will not experience complications related to urinary retention Outcome: Progressing   Problem: Pain Managment: Goal: General experience of comfort will improve and/or be controlled Outcome: Progressing   Problem: Safety: Goal: Ability to remain free from injury will improve Outcome: Progressing   Problem: Skin Integrity: Goal: Risk for impaired skin integrity will decrease Outcome: Progressing

## 2024-01-26 NOTE — Progress Notes (Signed)
 Daily Progress Note   Patient Name: Jacob Mcgrath.       Date: 01/23/2024 DOB: 08-06-1955  Age: 68 y.o. MRN#: [id number] Attending Physician: Davia Nydia POUR, MD Primary Care Physician: [name], MD Admit Date: 01/16/2024  Reason for Consultation/Follow-up: Establishing goals of care  Subjective: Medical records reviewed including progress notes, labs and imaging. Patient assessed at the bedside.  Pleasantly confused and in no pain or distress.  Student RN at the bedside.  Called patient's legal guardian [name] to follow-up on status of provided documentations and board committee meeting.  She has reviewed documents with her supervisor and they are ready to proceed with today's meeting.  She anticipates calling later today with a decision.  I provided additional clarification on different paths, either comfort focused care or aggressive life-prolonging care, as well as likelihood that vascular surgery would get back on board to follow after the weekend.  She is appreciative of the update.  Questions and concerns addressed. PMT will continue to support holistically.   Length of Stay: 7   Physical Exam Vitals and nursing note reviewed.  Constitutional:      General: He is not in acute distress.    Appearance: He is ill-appearing.     Interventions: Nasal cannula in place.  HENT:     Head: Normocephalic.  Cardiovascular:     Rate and Rhythm: Normal rate.  Pulmonary:     Effort: Pulmonary effort is normal.  Neurological:     Mental Status: He is alert.  Psychiatric:        Behavior: Behavior is cooperative.             Vital Signs: BP (!) 144/54 (BP Location: Right Arm)   Pulse 91   Temp 98.6 F (37 C) (Oral)   Resp 18   Ht 5' 9 (1.753 m)   Wt 65.6 kg   SpO2 100%   BMI 21.36 kg/m  SpO2: SpO2: 100  % O2 Device: O2 Device: Room Air O2 Flow Rate: O2 Flow Rate (L/min): 2 L/min      Palliative Assessment/Data: 20 to 30%   Palliative Care Assessment & Plan   Patient Profile: Palliative Care consult requested for goals of care discussion in this 68 y.o. male  with past medical history of  diabetes, hypertension, CVA, dementia, depression, status epilepticus. Recent hospitalization on 5/15-5/23 for status epilepticus requiring intubation for seizure control. He was admitted on 01/16/2024 via EMS from [facility] with fever and tremors. Tmax morning of arrival was 103. Patient given Tylenol  per facility.  Patient recently on 14 day antibiotic for diabetic foot ulceration. During work-up noted to have necrotic areas on feet. Imaging negative for osteomyelitis. Started on IV antibiotics. 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   Assessment: Sepsis secondary to LLE cellulitis Severe PAD with occluded SFA stent on left LLE decubitis ulcers Dementia Acute metabolic encephalopathy  Recommendations/Plan: Continue full code for now Continue current interventions Patient's legal guardian confirms that forms received were satisfactory and met her supervisors requests.  Tentative board meeting today with a decision PMT to continue to follow and support   Prognosis:  Unable to  determine  Discharge Planning: LTC [facility]    Frazier Balfour SHAUNNA Fell, [place]  Palliative Medicine Team Team phone # [phone]  Thank you for allowing the Palliative Medicine Team to assist in the care of this patient. Please utilize secure chat with additional questions, if there is no response within 30 minutes please call the above phone number.  Palliative Medicine Team providers are available by phone from 7am to 7pm daily and can be reached through the team cell phone.  Should this patient require assistance outside of these hours, please call the  patient's attending physician.

## 2024-01-26 NOTE — Progress Notes (Signed)
 Progress Note    Jacob Mcgrath.   FMW:[id number]  DOB: 04-04-1956  DOA: 01/16/2024     10 PCP: [name], MD  Initial CC: Foot wound  Hospital Course: 68 y.o. male with medical history significant for dementia, type 2 diabetes mellitus, hypertension, PAD s/p stenting, epilepsy, depression, stroke presented from LTC with report of fevers, tremors that started 6/23. He apparently had been on a 14 day course of ertapenem at SNF for diabetic foot infection/ulcer. He was noted to have fever prior to admission with necrotic appearance on foot. CT angio showed occluded left SFA stent extending into the proximal popliteal artery, occluded left posterior tibial artery to the mid calf amongst other findings.  Patient was admitted for further evaluation.     Assessment & Plan:   Sepsis from LLE cellulitis - Presented with sepsis criteria (tachycardia, leukocytosis, tachypnea, normal lactic acid, source), fever 103 F at SNF,  chronic  left lower extremity wounds which may be etiology of infection.   -Left foot x-ray-no evidence of osteomyelitis.   -UA with trace leukocytes not quite convincing as etiology -Blood cultures no growth, urine culture insignificant growth. - Adequate course of antibiotics completed for now.  He is nontoxic-appearing and afebrile. -Discontinue antibiotics for now and monitor clinically while awaiting further decisions from [facility]   PAD (peripheral artery disease) - severe -Surgery and vascular surgery consulted -CT angio bifemoral- Occluded left SFA stent extending into the proximal popliteal artery.  Occluded left posterior tibial artery to the mid calf. Patent left external iliac artery stent. Short-segment high-grade stenosis mid right external iliac artery. - Per vascular surgery, now with nonviable left foot and occluded stents in the LLE, recommended AKA - Palliative medicine consulted and helping assist for GOC/amputation    Decubitus  ulcers Left lower extremity decubitus ulcers-on lateral side.  Unstageable.  Black eschar present.  Possible cellulitis, but due to hyperpigmentation hard to tell, but no fluctuance or swelling appreciated.  Left foot x-ray- No acute abnormality. - s/p abx course here too Per MAR-he had been on a 14 day course of  IV Ertepenem for diabetic foot ulcer   Essential hypertension - continue Coreg     Acute metabolic encephalopathy Now more awake. Baseline dementia.  Unable to determine severity of patient's baseline dementia S/p abx course    Dementia without behavioral disturbance Delirium precautions Palliative medicine following   Epilepsy Continue oral Keppra    Controlled type 2 diabetes mellitus - HgbA1c - 5.9%    Pressure Injury 10/04/22 Sacrum Medial Stage 1 -  Intact skin with non-blanchable redness of a localized area usually over a bony prominence. (Active)  10/04/22 2011  Location: Sacrum  Location Orientation: Medial  Staging: Stage 1 -  Intact skin with non-blanchable redness of a localized area usually over a bony prominence.  Wound Description (Comments):   DO NOT USE:  Present on Admission:     Interval History:  No events overnight.  More awake and talking today.  Still has significant left lower extremity pain on touch otherwise doing okay.   Old records reviewed in assessment of this patient  Antimicrobials: Vancomycin  01/16/2024 >> 7/3 Flagyl  01/16/2024 >> 7/3 Cefepime  01/16/2024 >> 7/3  DVT prophylaxis:  enoxaparin  (LOVENOX ) injection 40 mg Start: 01/16/24 2130   Code Status:   Code Status: Full Code  Mobility Assessment (Last 72 Hours)     Mobility Assessment     Row Name 01/26/24 1041 01/26/24 1040 01/25/24 2100 01/25/24 0900 01/24/24 2000  Does patient have an order for bedrest or is patient medically unstable No - Continue assessment No - Continue assessment No - Continue assessment No - Continue assessment No - Continue assessment   What is the  highest level of mobility based on the progressive mobility assessment? Level 2 (Chairfast) - Balance while sitting on edge of bed and cannot stand -- Level 2 (Chairfast) - Balance while sitting on edge of bed and cannot stand Level 2 (Chairfast) - Balance while sitting on edge of bed and cannot stand Level 2 (Chairfast) - Balance while sitting on edge of bed and cannot stand   Is the above level different from baseline mobility prior to current illness? Yes - Recommend PT order -- Yes - Recommend PT order Yes - Recommend PT order --    Row Name 01/24/24 9070 01/23/24 2251         Does patient have an order for bedrest or is patient medically unstable No - Continue assessment No - Continue assessment      What is the highest level of mobility based on the progressive mobility assessment? Level 2 (Chairfast) - Balance while sitting on edge of bed and cannot stand Level 2 (Chairfast) - Balance while sitting on edge of bed and cannot stand         Barriers to discharge: TBD Disposition Plan:  TBD HH orders placed: n/a Status is: Inpt  Objective: Blood pressure 116/67, pulse 94, temperature 99.1 F (37.3 C), temperature source Axillary, resp. rate (!) 21, height 5' 9 (1.753 m), weight 65.6 kg, SpO2 (!) 75%.  Examination:  Physical Exam Constitutional:      General: He is not in acute distress.    Comments: Chronically ill-appearing adult man appearing older than stated age lying in bed in no distress  HENT:     Head: Normocephalic and atraumatic.     Mouth/Throat:     Mouth: Mucous membranes are moist.  Eyes:     Extraocular Movements: Extraocular movements intact.  Cardiovascular:     Rate and Rhythm: Normal rate and regular rhythm.  Pulmonary:     Effort: Pulmonary effort is normal. No respiratory distress.     Breath sounds: Normal breath sounds. No wheezing.  Abdominal:     General: Bowel sounds are normal. There is no distension.     Palpations: Abdomen is soft.      Tenderness: There is no abdominal tenderness.  Musculoskeletal:     Cervical back: Normal range of motion and neck supple.     Comments: Dressings covering left lower extremity wounds and are tender to palpation  Skin:    General: Skin is warm and dry.  Neurological:     Comments: Slowed mentation and underlying dementia appreciated      Consultants:  Palliative care Vascular surgery  Procedures:    Data Reviewed: Results for orders placed or performed during the hospital encounter of 01/16/24 (from the past 24 hours)  Glucose, capillary     Status: Abnormal   Collection Time: 01/25/24  8:59 PM  Result Value Ref Range   Glucose-Capillary 156 (H) 70 - 99 mg/dL  Basic metabolic panel with GFR     Status: Abnormal   Collection Time: 01/26/24  3:24 AM  Result Value Ref Range   Sodium 139 135 - 145 mmol/L   Potassium 3.9 3.5 - 5.1 mmol/L   Chloride 109 98 - 111 mmol/L   CO2 21 (L) 22 - 32 mmol/L   Glucose, Bld  127 (H) 70 - 99 mg/dL   BUN 18 8 - 23 mg/dL   Creatinine, Ser 9.06 0.61 - 1.24 mg/dL   Calcium  9.1 8.9 - 10.3 mg/dL   GFR, Estimated >39 >39 mL/min   Anion gap 9 5 - 15  CBC with Differential/Platelet     Status: Abnormal   Collection Time: 01/26/24  3:24 AM  Result Value Ref Range   WBC 15.8 (H) 4.0 - 10.5 K/uL   RBC 2.99 (L) 4.22 - 5.81 MIL/uL   Hemoglobin 8.7 (L) 13.0 - 17.0 g/dL   HCT 72.5 (L) 60.9 - 47.9 %   MCV 91.6 80.0 - 100.0 fL   MCH 29.1 26.0 - 34.0 pg   MCHC 31.8 30.0 - 36.0 g/dL   RDW 84.9 88.4 - 84.4 %   Platelets 671 (H) 150 - 400 K/uL   nRBC 0.0 0.0 - 0.2 %   Neutrophils Relative % 57 %   Neutro Abs 9.0 (H) 1.7 - 7.7 K/uL   Lymphocytes Relative 26 %   Lymphs Abs 4.0 0.7 - 4.0 K/uL   Monocytes Relative 14 %   Monocytes Absolute 2.2 (H) 0.1 - 1.0 K/uL   Eosinophils Relative 2 %   Eosinophils Absolute 0.2 0.0 - 0.5 K/uL   Basophils Relative 0 %   Basophils Absolute 0.1 0.0 - 0.1 K/uL   WBC Morphology MORPHOLOGY UNREMARKABLE    RBC Morphology  See Note    Smear Review Normal platelet morphology    Immature Granulocytes 1 %   Abs Immature Granulocytes 0.18 (H) 0.00 - 0.07 K/uL   Burr Cells PRESENT   Magnesium      Status: None   Collection Time: 01/26/24  3:24 AM  Result Value Ref Range   Magnesium  1.8 1.7 - 2.4 mg/dL  Glucose, capillary     Status: None   Collection Time: 01/26/24  9:13 AM  Result Value Ref Range   Glucose-Capillary 83 70 - 99 mg/dL  Glucose, capillary     Status: Abnormal   Collection Time: 01/26/24 11:56 AM  Result Value Ref Range   Glucose-Capillary 140 (H) 70 - 99 mg/dL    I have reviewed pertinent nursing notes, vitals, labs, and images as necessary. I have ordered labwork to follow up on as indicated.  I have reviewed the last notes from staff over past 24 hours. I have discussed patient's care plan and test results with nursing staff, CM/SW, and other staff as appropriate.  Time spent: Greater than 50% of the 55 minute visit was spent in counseling/coordination of care for the patient as laid out in the A&P.   LOS: 10 days   Alm Apo, MD [facility] 01/26/2024, 4:17 PM

## 2024-01-27 DIAGNOSIS — F0390 Unspecified dementia without behavioral disturbance: Secondary | ICD-10-CM | POA: Diagnosis not present

## 2024-01-27 DIAGNOSIS — L97529 Non-pressure chronic ulcer of other part of left foot with unspecified severity: Secondary | ICD-10-CM | POA: Diagnosis not present

## 2024-01-27 DIAGNOSIS — A419 Sepsis, unspecified organism: Secondary | ICD-10-CM | POA: Diagnosis not present

## 2024-01-27 LAB — CBC WITH DIFFERENTIAL/PLATELET
Abs Immature Granulocytes: 0.15 K/uL — ABNORMAL HIGH (ref 0.00–0.07)
Basophils Absolute: 0.1 K/uL (ref 0.0–0.1)
Basophils Relative: 0 %
Eosinophils Absolute: 0.2 K/uL (ref 0.0–0.5)
Eosinophils Relative: 1 %
HCT: 27.8 % — ABNORMAL LOW (ref 39.0–52.0)
Hemoglobin: 9 g/dL — ABNORMAL LOW (ref 13.0–17.0)
Immature Granulocytes: 1 %
Lymphocytes Relative: 24 %
Lymphs Abs: 3.8 K/uL (ref 0.7–4.0)
MCH: 29.5 pg (ref 26.0–34.0)
MCHC: 32.4 g/dL (ref 30.0–36.0)
MCV: 91.1 fL (ref 80.0–100.0)
Monocytes Absolute: 2.5 K/uL — ABNORMAL HIGH (ref 0.1–1.0)
Monocytes Relative: 16 %
Neutro Abs: 9.3 K/uL — ABNORMAL HIGH (ref 1.7–7.7)
Neutrophils Relative %: 58 %
Platelets: 695 K/uL — ABNORMAL HIGH (ref 150–400)
RBC: 3.05 MIL/uL — ABNORMAL LOW (ref 4.22–5.81)
RDW: 15.1 % (ref 11.5–15.5)
WBC: 16 K/uL — ABNORMAL HIGH (ref 4.0–10.5)
nRBC: 0 % (ref 0.0–0.2)

## 2024-01-27 LAB — BASIC METABOLIC PANEL WITH GFR
Anion gap: 10 (ref 5–15)
BUN: 19 mg/dL (ref 8–23)
CO2: 21 mmol/L — ABNORMAL LOW (ref 22–32)
Calcium: 9.3 mg/dL (ref 8.9–10.3)
Chloride: 110 mmol/L (ref 98–111)
Creatinine, Ser: 0.94 mg/dL (ref 0.61–1.24)
GFR, Estimated: >60 mL/min (ref >60)
Glucose, Bld: 118 mg/dL — ABNORMAL HIGH (ref 70–99)
Potassium: 4.3 mmol/L (ref 3.5–5.1)
Sodium: 141 mmol/L (ref 135–145)

## 2024-01-27 LAB — GLUCOSE, CAPILLARY
Glucose-Capillary: 139 mg/dL — ABNORMAL HIGH (ref 70–99)
Glucose-Capillary: 114 mg/dL — ABNORMAL HIGH (ref 70–99)
Glucose-Capillary: 123 mg/dL — ABNORMAL HIGH (ref 70–99)
Glucose-Capillary: 132 mg/dL — ABNORMAL HIGH (ref 70–99)

## 2024-01-27 LAB — MAGNESIUM: Magnesium: 1.8 mg/dL (ref 1.7–2.4)

## 2024-01-27 NOTE — Progress Notes (Signed)
 Progress Note    Jacob Mcgrath.   FMW:[id number]  DOB: 08-19-1955  DOA: 01/16/2024     11 PCP: [name], MD  Initial CC: Foot wound  Hospital Course: 68 y.o. male with medical history significant for dementia, type 2 diabetes mellitus, hypertension, PAD s/p stenting, epilepsy, depression, stroke presented from LTC with report of fevers, tremors that started 6/23. He apparently had been on a 14 day course of ertapenem at SNF for diabetic foot infection/ulcer. He was noted to have fever prior to admission with necrotic appearance on foot. CT angio showed occluded left SFA stent extending into the proximal popliteal artery, occluded left posterior tibial artery to the mid calf amongst other findings.  Patient was admitted for further evaluation.     Assessment & Plan:   Sepsis from LLE cellulitis - Presented with sepsis criteria (tachycardia, leukocytosis, tachypnea, normal lactic acid, source), fever 103 F at SNF,  chronic  left lower extremity wounds which may be etiology of infection.   -Left foot x-ray-no evidence of osteomyelitis.   -UA with trace leukocytes not quite convincing as etiology -Blood cultures no growth, urine culture insignificant growth. - Adequate course of antibiotics completed for now.  He is nontoxic-appearing and afebrile. -Discontinue antibiotics for now and monitor clinically while awaiting further decisions from [facility]   PAD (peripheral artery disease) - severe -Surgery and vascular surgery consulted -CT angio bifemoral- Occluded left SFA stent extending into the proximal popliteal artery.  Occluded left posterior tibial artery to the mid calf. Patent left external iliac artery stent. Short-segment high-grade stenosis mid right external iliac artery. - Per vascular surgery, now with nonviable left foot and occluded stents in the LLE, recommended AKA - Palliative medicine consulted and helping assist for GOC/amputation    Decubitus  ulcers Left lower extremity decubitus ulcers-on lateral side.  Unstageable.  Black eschar present.  Possible cellulitis, but due to hyperpigmentation hard to tell, but no fluctuance or swelling appreciated.  Left foot x-ray- No acute abnormality. - s/p abx course here too Per MAR-he had been on a 14 day course of  IV Ertepenem for diabetic foot ulcer   Essential hypertension - continue Coreg     Acute metabolic encephalopathy Now more awake. Baseline dementia.  Unable to determine severity of patient's baseline dementia S/p abx course    Dementia without behavioral disturbance Delirium precautions Palliative medicine following   Epilepsy Continue oral Keppra    Controlled type 2 diabetes mellitus - HgbA1c - 5.9%    Pressure Injury 10/04/22 Sacrum Medial Stage 1 -  Intact skin with non-blanchable redness of a localized area usually over a bony prominence. (Active)  10/04/22 2011  Location: Sacrum  Location Orientation: Medial  Staging: Stage 1 -  Intact skin with non-blanchable redness of a localized area usually over a bony prominence.  Wound Description (Comments):   DO NOT USE:  Present on Admission:     Interval History:  No events overnight.  More awake and talking today.  Underlying cognitive impairment appreciated and remains baseline.   Old records reviewed in assessment of this patient  Antimicrobials: Vancomycin  01/16/2024 >> 7/3 Flagyl  01/16/2024 >> 7/3 Cefepime  01/16/2024 >> 7/3  DVT prophylaxis:  enoxaparin  (LOVENOX ) injection 40 mg Start: 01/16/24 2130   Code Status:   Code Status: Full Code  Mobility Assessment (Last 72 Hours)     Mobility Assessment     Row Name 01/27/24 0835 01/26/24 2000 01/26/24 1041 01/26/24 1040 01/25/24 2100   Does patient have an  order for bedrest or is patient medically unstable No - Continue assessment No - Continue assessment No - Continue assessment No - Continue assessment No - Continue assessment   What is the highest  level of mobility based on the progressive mobility assessment? Level 2 (Chairfast) - Balance while sitting on edge of bed and cannot stand Level 2 (Chairfast) - Balance while sitting on edge of bed and cannot stand Level 2 (Chairfast) - Balance while sitting on edge of bed and cannot stand -- Level 2 (Chairfast) - Balance while sitting on edge of bed and cannot stand   Is the above level different from baseline mobility prior to current illness? -- Yes - Recommend PT order Yes - Recommend PT order -- Yes - Recommend PT order    Row Name 01/25/24 0900 01/24/24 2000         Does patient have an order for bedrest or is patient medically unstable No - Continue assessment No - Continue assessment      What is the highest level of mobility based on the progressive mobility assessment? Level 2 (Chairfast) - Balance while sitting on edge of bed and cannot stand Level 2 (Chairfast) - Balance while sitting on edge of bed and cannot stand      Is the above level different from baseline mobility prior to current illness? Yes - Recommend PT order --         Barriers to discharge: TBD Disposition Plan:  TBD HH orders placed: n/a Status is: Inpt  Objective: Blood pressure (!) 125/49, pulse (!) 101, temperature 98.1 F (36.7 C), temperature source Oral, resp. rate 18, height 5' 9 (1.753 m), weight 65.6 kg, SpO2 90%.  Examination:  Physical Exam Constitutional:      General: He is not in acute distress.    Comments: Chronically ill-appearing adult man appearing older than stated age lying in bed in no distress  HENT:     Head: Normocephalic and atraumatic.     Mouth/Throat:     Mouth: Mucous membranes are moist.  Eyes:     Extraocular Movements: Extraocular movements intact.  Cardiovascular:     Rate and Rhythm: Normal rate and regular rhythm.  Pulmonary:     Effort: Pulmonary effort is normal. No respiratory distress.     Breath sounds: Normal breath sounds. No wheezing.  Abdominal:      General: Bowel sounds are normal. There is no distension.     Palpations: Abdomen is soft.     Tenderness: There is no abdominal tenderness.  Musculoskeletal:     Cervical back: Normal range of motion and neck supple.     Comments: Dressings covering left lower extremity wounds and are tender to palpation  Skin:    General: Skin is warm and dry.  Neurological:     Comments: Slowed mentation and underlying dementia appreciated      Consultants:  Palliative care Vascular surgery  Procedures:    Data Reviewed: Results for orders placed or performed during the hospital encounter of 01/16/24 (from the past 24 hours)  Glucose, capillary     Status: None   Collection Time: 01/26/24  4:21 PM  Result Value Ref Range   Glucose-Capillary 99 70 - 99 mg/dL  Glucose, capillary     Status: Abnormal   Collection Time: 01/26/24  8:18 PM  Result Value Ref Range   Glucose-Capillary 114 (H) 70 - 99 mg/dL  Basic metabolic panel with GFR     Status: Abnormal  Collection Time: 01/27/24  3:16 AM  Result Value Ref Range   Sodium 141 135 - 145 mmol/L   Potassium 4.3 3.5 - 5.1 mmol/L   Chloride 110 98 - 111 mmol/L   CO2 21 (L) 22 - 32 mmol/L   Glucose, Bld 118 (H) 70 - 99 mg/dL   BUN 19 8 - 23 mg/dL   Creatinine, Ser 9.05 0.61 - 1.24 mg/dL   Calcium  9.3 8.9 - 10.3 mg/dL   GFR, Estimated >39 >39 mL/min   Anion gap 10 5 - 15  CBC with Differential/Platelet     Status: Abnormal   Collection Time: 01/27/24  3:16 AM  Result Value Ref Range   WBC 16.0 (H) 4.0 - 10.5 K/uL   RBC 3.05 (L) 4.22 - 5.81 MIL/uL   Hemoglobin 9.0 (L) 13.0 - 17.0 g/dL   HCT 72.1 (L) 60.9 - 47.9 %   MCV 91.1 80.0 - 100.0 fL   MCH 29.5 26.0 - 34.0 pg   MCHC 32.4 30.0 - 36.0 g/dL   RDW 84.8 88.4 - 84.4 %   Platelets 695 (H) 150 - 400 K/uL   nRBC 0.0 0.0 - 0.2 %   Neutrophils Relative % 58 %   Neutro Abs 9.3 (H) 1.7 - 7.7 K/uL   Lymphocytes Relative 24 %   Lymphs Abs 3.8 0.7 - 4.0 K/uL   Monocytes Relative 16 %    Monocytes Absolute 2.5 (H) 0.1 - 1.0 K/uL   Eosinophils Relative 1 %   Eosinophils Absolute 0.2 0.0 - 0.5 K/uL   Basophils Relative 0 %   Basophils Absolute 0.1 0.0 - 0.1 K/uL   Immature Granulocytes 1 %   Abs Immature Granulocytes 0.15 (H) 0.00 - 0.07 K/uL  Magnesium      Status: None   Collection Time: 01/27/24  3:16 AM  Result Value Ref Range   Magnesium  1.8 1.7 - 2.4 mg/dL  Glucose, capillary     Status: Abnormal   Collection Time: 01/27/24  9:13 AM  Result Value Ref Range   Glucose-Capillary 139 (H) 70 - 99 mg/dL   Comment 1 Notify RN    Comment 2 Document in Chart   Glucose, capillary     Status: Abnormal   Collection Time: 01/27/24 12:25 PM  Result Value Ref Range   Glucose-Capillary 114 (H) 70 - 99 mg/dL   Comment 1 Notify RN    Comment 2 Document in Chart     I have reviewed pertinent nursing notes, vitals, labs, and images as necessary. I have ordered labwork to follow up on as indicated.  I have reviewed the last notes from staff over past 24 hours. I have discussed patient's care plan and test results with nursing staff, CM/SW, and other staff as appropriate.    LOS: 11 days   Alm Apo, MD [facility] 01/27/2024, 2:14 PM

## 2024-01-27 NOTE — Plan of Care (Signed)
 The patient remains on MC-2W as of time of writing. The patient is awake and alert, oriented to person only; intermittently follows commands. Contractures to left side of his body. Meds are crushed in applesauce for administration. Total care with ADLs.   Problem: Education: Goal: Knowledge of General Education information will improve Description: Including pain rating scale, medication(s)/side effects and non-pharmacologic comfort measures Outcome: Not Progressing   Problem: Health Behavior/Discharge Planning: Goal: Ability to manage health-related needs will improve Outcome: Not Progressing   Problem: Clinical Measurements: Goal: Ability to maintain clinical measurements within normal limits will improve Outcome: Not Progressing Goal: Will remain free from infection Outcome: Not Progressing Goal: Diagnostic test results will improve Outcome: Not Progressing Goal: Respiratory complications will improve Outcome: Not Progressing Goal: Cardiovascular complication will be avoided Outcome: Not Progressing   Problem: Activity: Goal: Risk for activity intolerance will decrease Outcome: Not Progressing   Problem: Nutrition: Goal: Adequate nutrition will be maintained Outcome: Not Progressing   Problem: Coping: Goal: Level of anxiety will decrease Outcome: Not Progressing   Problem: Elimination: Goal: Will not experience complications related to bowel motility Outcome: Not Progressing Goal: Will not experience complications related to urinary retention Outcome: Not Progressing   Problem: Pain Managment: Goal: General experience of comfort will improve and/or be controlled Outcome: Not Progressing   Problem: Safety: Goal: Ability to remain free from injury will improve Outcome: Not Progressing   Problem: Skin Integrity: Goal: Risk for impaired skin integrity will decrease Outcome: Not Progressing   Problem: Education: Goal: Ability to describe self-care measures that  may prevent or decrease complications (Diabetes Survival Skills Education) will improve Outcome: Not Progressing Goal: Individualized Educational Video(s) Outcome: Not Progressing   Problem: Coping: Goal: Ability to adjust to condition or change in health will improve Outcome: Not Progressing   Problem: Fluid Volume: Goal: Ability to maintain a balanced intake and output will improve Outcome: Not Progressing   Problem: Health Behavior/Discharge Planning: Goal: Ability to identify and utilize available resources and services will improve Outcome: Not Progressing Goal: Ability to manage health-related needs will improve Outcome: Not Progressing   Problem: Metabolic: Goal: Ability to maintain appropriate glucose levels will improve Outcome: Not Progressing   Problem: Nutritional: Goal: Maintenance of adequate nutrition will improve Outcome: Not Progressing Goal: Progress toward achieving an optimal weight will improve Outcome: Not Progressing   Problem: Skin Integrity: Goal: Risk for impaired skin integrity will decrease Outcome: Not Progressing   Problem: Tissue Perfusion: Goal: Adequacy of tissue perfusion will improve Outcome: Not Progressing   Problem: Education: Goal: Knowledge of General Education information will improve Description: Including pain rating scale, medication(s)/side effects and non-pharmacologic comfort measures Outcome: Not Progressing   Problem: Health Behavior/Discharge Planning: Goal: Ability to manage health-related needs will improve Outcome: Not Progressing   Problem: Clinical Measurements: Goal: Ability to maintain clinical measurements within normal limits will improve Outcome: Not Progressing Goal: Will remain free from infection Outcome: Not Progressing Goal: Diagnostic test results will improve Outcome: Not Progressing Goal: Respiratory complications will improve Outcome: Not Progressing Goal: Cardiovascular complication will be  avoided Outcome: Not Progressing   Problem: Activity: Goal: Risk for activity intolerance will decrease Outcome: Not Progressing   Problem: Nutrition: Goal: Adequate nutrition will be maintained Outcome: Not Progressing   Problem: Coping: Goal: Level of anxiety will decrease Outcome: Not Progressing   Problem: Elimination: Goal: Will not experience complications related to bowel motility Outcome: Not Progressing Goal: Will not experience complications related to urinary retention Outcome: Not Progressing  Problem: Pain Managment: Goal: General experience of comfort will improve and/or be controlled Outcome: Not Progressing   Problem: Safety: Goal: Ability to remain free from injury will improve Outcome: Not Progressing   Problem: Skin Integrity: Goal: Risk for impaired skin integrity will decrease Outcome: Not Progressing

## 2024-01-28 DIAGNOSIS — F0390 Unspecified dementia without behavioral disturbance: Secondary | ICD-10-CM | POA: Diagnosis not present

## 2024-01-28 DIAGNOSIS — L89899 Pressure ulcer of other site, unspecified stage: Secondary | ICD-10-CM | POA: Diagnosis not present

## 2024-01-28 DIAGNOSIS — I739 Peripheral vascular disease, unspecified: Secondary | ICD-10-CM | POA: Diagnosis not present

## 2024-01-28 DIAGNOSIS — A419 Sepsis, unspecified organism: Secondary | ICD-10-CM | POA: Diagnosis not present

## 2024-01-28 LAB — CBC WITH DIFFERENTIAL/PLATELET
Abs Immature Granulocytes: 0.21 K/uL — ABNORMAL HIGH (ref 0.00–0.07)
Basophils Absolute: 0 K/uL (ref 0.0–0.1)
Basophils Relative: 0 %
Eosinophils Absolute: 0.1 K/uL (ref 0.0–0.5)
Eosinophils Relative: 1 %
HCT: 24.8 % — ABNORMAL LOW (ref 39.0–52.0)
Hemoglobin: 8.3 g/dL — ABNORMAL LOW (ref 13.0–17.0)
Immature Granulocytes: 1 %
Lymphocytes Relative: 18 %
Lymphs Abs: 3.4 K/uL (ref 0.7–4.0)
MCH: 30.2 pg (ref 26.0–34.0)
MCHC: 33.5 g/dL (ref 30.0–36.0)
MCV: 90.2 fL (ref 80.0–100.0)
Monocytes Absolute: 2.7 K/uL — ABNORMAL HIGH (ref 0.1–1.0)
Monocytes Relative: 15 %
Neutro Abs: 12.3 K/uL — ABNORMAL HIGH (ref 1.7–7.7)
Neutrophils Relative %: 65 %
Platelets: 651 K/uL — ABNORMAL HIGH (ref 150–400)
RBC: 2.75 MIL/uL — ABNORMAL LOW (ref 4.22–5.81)
RDW: 15.1 % (ref 11.5–15.5)
WBC: 18.8 K/uL — ABNORMAL HIGH (ref 4.0–10.5)
nRBC: 0 % (ref 0.0–0.2)

## 2024-01-28 LAB — GLUCOSE, CAPILLARY
Glucose-Capillary: 134 mg/dL — ABNORMAL HIGH (ref 70–99)
Glucose-Capillary: 145 mg/dL — ABNORMAL HIGH (ref 70–99)
Glucose-Capillary: 125 mg/dL — ABNORMAL HIGH (ref 70–99)
Glucose-Capillary: 136 mg/dL — ABNORMAL HIGH (ref 70–99)

## 2024-01-28 LAB — BASIC METABOLIC PANEL WITH GFR
Anion gap: 11 (ref 5–15)
BUN: 21 mg/dL (ref 8–23)
CO2: 21 mmol/L — ABNORMAL LOW (ref 22–32)
Calcium: 9 mg/dL (ref 8.9–10.3)
Chloride: 105 mmol/L (ref 98–111)
Creatinine, Ser: 0.89 mg/dL (ref 0.61–1.24)
GFR, Estimated: >60 mL/min (ref >60)
Glucose, Bld: 129 mg/dL — ABNORMAL HIGH (ref 70–99)
Potassium: 3.9 mmol/L (ref 3.5–5.1)
Sodium: 137 mmol/L (ref 135–145)

## 2024-01-28 LAB — MAGNESIUM: Magnesium: 1.8 mg/dL (ref 1.7–2.4)

## 2024-01-28 NOTE — Progress Notes (Signed)
 Progress Note    Jacob Mcgrath.   FMW:[id number]  DOB: 1956/05/30  DOA: 01/16/2024     12 PCP: [name], MD  Initial CC: Foot wound  Hospital Course: 68 y.o. male with medical history significant for dementia, type 2 diabetes mellitus, hypertension, PAD s/p stenting, epilepsy, depression, stroke presented from LTC with report of fevers, tremors that started 6/23. He apparently had been on a 14 day course of ertapenem at SNF for diabetic foot infection/ulcer. He was noted to have fever prior to admission with necrotic appearance on foot. CT angio showed occluded left SFA stent extending into the proximal popliteal artery, occluded left posterior tibial artery to the mid calf amongst other findings.  Patient was admitted for further evaluation.     Assessment & Plan:   Sepsis from LLE cellulitis - Presented with sepsis criteria (tachycardia, leukocytosis, tachypnea, normal lactic acid, source), fever 103 F at SNF,  chronic  left lower extremity wounds which may be etiology of infection.   -Left foot x-ray-no evidence of osteomyelitis.   -UA with trace leukocytes not quite convincing as etiology -Blood cultures no growth, urine culture insignificant growth. - Adequate course of antibiotics completed for now.  He is nontoxic-appearing and afebrile. -Discontinue antibiotics for now and monitor clinically while awaiting further decisions from [facility] - WBC is trending back up after stopping abx; difficult situation given prolonged abx likely futile. Ideally, transition to hospice/comfort care would be more dignified and focus on quality of life; still awaiting decision from [facility]   PAD (peripheral artery disease) - severe -Surgery and vascular surgery consulted -CT angio bifemoral- Occluded left SFA stent extending into the proximal popliteal artery.  Occluded left posterior tibial artery to the mid calf. Patent left external iliac artery stent. Short-segment  high-grade stenosis mid right external iliac artery. - Per vascular surgery, now with nonviable left foot and occluded stents in the LLE, recommended AKA - Palliative medicine consulted and helping assist for GOC/amputation    Decubitus ulcers Left lower extremity decubitus ulcers-on lateral side.  Unstageable.  Black eschar present.  Possible cellulitis, but due to hyperpigmentation hard to tell, but no fluctuance or swelling appreciated.  Left foot x-ray- No acute abnormality. - s/p abx course here too Per MAR-he had been on a 14 day course of  IV Ertepenem for diabetic foot ulcer   Essential hypertension - continue Coreg     Acute metabolic encephalopathy Now more awake. Baseline dementia.  Unable to determine severity of patient's baseline dementia S/p abx course    Dementia without behavioral disturbance Delirium precautions Palliative medicine following   Epilepsy Continue oral Keppra    Controlled type 2 diabetes mellitus - HgbA1c - 5.9%    Pressure Injury 10/04/22 Sacrum Medial Stage 1 -  Intact skin with non-blanchable redness of a localized area usually over a bony prominence. (Active)  10/04/22 2011  Location: Sacrum  Location Orientation: Medial  Staging: Stage 1 -  Intact skin with non-blanchable redness of a localized area usually over a bony prominence.  Wound Description (Comments):   DO NOT USE:  Present on Admission:     Interval History:  No events overnight.  More awake and talking today.  Underlying cognitive impairment appreciated and remains baseline. Still awaiting updates regarding [facility]/guardian decisions for management.    Old records reviewed in assessment of this patient  Antimicrobials: Vancomycin  01/16/2024 >> 7/3 Flagyl  01/16/2024 >> 7/3 Cefepime  01/16/2024 >> 7/3  DVT prophylaxis:  enoxaparin  (LOVENOX ) injection 40 mg Start: 01/16/24  2130   Code Status:   Code Status: Full Code  Mobility Assessment (Last 72 Hours)     Mobility  Assessment     Row Name 01/28/24 1318 01/27/24 2000 01/27/24 0835 01/26/24 2000 01/26/24 1041   Does patient have an order for bedrest or is patient medically unstable No - Continue assessment No - Continue assessment No - Continue assessment No - Continue assessment No - Continue assessment   What is the highest level of mobility based on the progressive mobility assessment? Level 2 (Chairfast) - Balance while sitting on edge of bed and cannot stand Level 2 (Chairfast) - Balance while sitting on edge of bed and cannot stand Level 2 (Chairfast) - Balance while sitting on edge of bed and cannot stand Level 2 (Chairfast) - Balance while sitting on edge of bed and cannot stand Level 2 (Chairfast) - Balance while sitting on edge of bed and cannot stand   Is the above level different from baseline mobility prior to current illness? -- Yes - Recommend PT order -- Yes - Recommend PT order Yes - Recommend PT order    Row Name 01/26/24 1040 01/25/24 2100         Does patient have an order for bedrest or is patient medically unstable No - Continue assessment No - Continue assessment      What is the highest level of mobility based on the progressive mobility assessment? -- Level 2 (Chairfast) - Balance while sitting on edge of bed and cannot stand      Is the above level different from baseline mobility prior to current illness? -- Yes - Recommend PT order         Barriers to discharge: TBD Disposition Plan:  TBD HH orders placed: n/a Status is: Inpt  Objective: Blood pressure 130/70, pulse (!) 112, temperature 98.5 F (36.9 C), resp. rate 16, height 5' 9 (1.753 m), weight 65.6 kg, SpO2 100%.  Examination:  Physical Exam Constitutional:      General: He is not in acute distress.    Comments: Chronically ill-appearing adult man appearing older than stated age lying in bed in no distress  HENT:     Head: Normocephalic and atraumatic.     Mouth/Throat:     Mouth: Mucous membranes are moist.   Eyes:     Extraocular Movements: Extraocular movements intact.  Cardiovascular:     Rate and Rhythm: Normal rate and regular rhythm.  Pulmonary:     Effort: Pulmonary effort is normal. No respiratory distress.     Breath sounds: Normal breath sounds. No wheezing.  Abdominal:     General: Bowel sounds are normal. There is no distension.     Palpations: Abdomen is soft.     Tenderness: There is no abdominal tenderness.  Musculoskeletal:     Cervical back: Normal range of motion and neck supple.     Comments: Dressings covering left lower extremity wounds and are tender to palpation  Skin:    General: Skin is warm and dry.  Neurological:     Comments: Slowed mentation and underlying dementia appreciated      Consultants:  Palliative care Vascular surgery  Procedures:    Data Reviewed: Results for orders placed or performed during the hospital encounter of 01/16/24 (from the past 24 hours)  Glucose, capillary     Status: Abnormal   Collection Time: 01/27/24  4:16 PM  Result Value Ref Range   Glucose-Capillary 123 (H) 70 - 99 mg/dL  Glucose,  capillary     Status: Abnormal   Collection Time: 01/27/24  8:14 PM  Result Value Ref Range   Glucose-Capillary 132 (H) 70 - 99 mg/dL  Basic metabolic panel with GFR     Status: Abnormal   Collection Time: 01/28/24  5:34 AM  Result Value Ref Range   Sodium 137 135 - 145 mmol/L   Potassium 3.9 3.5 - 5.1 mmol/L   Chloride 105 98 - 111 mmol/L   CO2 21 (L) 22 - 32 mmol/L   Glucose, Bld 129 (H) 70 - 99 mg/dL   BUN 21 8 - 23 mg/dL   Creatinine, Ser 9.10 0.61 - 1.24 mg/dL   Calcium  9.0 8.9 - 10.3 mg/dL   GFR, Estimated >39 >39 mL/min   Anion gap 11 5 - 15  CBC with Differential/Platelet     Status: Abnormal   Collection Time: 01/28/24  5:34 AM  Result Value Ref Range   WBC 18.8 (H) 4.0 - 10.5 K/uL   RBC 2.75 (L) 4.22 - 5.81 MIL/uL   Hemoglobin 8.3 (L) 13.0 - 17.0 g/dL   HCT 75.1 (L) 60.9 - 47.9 %   MCV 90.2 80.0 - 100.0 fL   MCH  30.2 26.0 - 34.0 pg   MCHC 33.5 30.0 - 36.0 g/dL   RDW 84.8 88.4 - 84.4 %   Platelets 651 (H) 150 - 400 K/uL   nRBC 0.0 0.0 - 0.2 %   Neutrophils Relative % 65 %   Neutro Abs 12.3 (H) 1.7 - 7.7 K/uL   Lymphocytes Relative 18 %   Lymphs Abs 3.4 0.7 - 4.0 K/uL   Monocytes Relative 15 %   Monocytes Absolute 2.7 (H) 0.1 - 1.0 K/uL   Eosinophils Relative 1 %   Eosinophils Absolute 0.1 0.0 - 0.5 K/uL   Basophils Relative 0 %   Basophils Absolute 0.0 0.0 - 0.1 K/uL   Immature Granulocytes 1 %   Abs Immature Granulocytes 0.21 (H) 0.00 - 0.07 K/uL  Magnesium      Status: None   Collection Time: 01/28/24  5:34 AM  Result Value Ref Range   Magnesium  1.8 1.7 - 2.4 mg/dL  Glucose, capillary     Status: Abnormal   Collection Time: 01/28/24  7:59 AM  Result Value Ref Range   Glucose-Capillary 134 (H) 70 - 99 mg/dL  Glucose, capillary     Status: Abnormal   Collection Time: 01/28/24 11:21 AM  Result Value Ref Range   Glucose-Capillary 145 (H) 70 - 99 mg/dL    I have reviewed pertinent nursing notes, vitals, labs, and images as necessary. I have ordered labwork to follow up on as indicated.  I have reviewed the last notes from staff over past 24 hours. I have discussed patient's care plan and test results with nursing staff, CM/SW, and other staff as appropriate.    LOS: 12 days   Alm Apo, MD [facility] 01/28/2024, 3:02 PM

## 2024-01-28 NOTE — Plan of Care (Signed)
  Problem: Education: Goal: Knowledge of General Education information will improve Description: Including pain rating scale, medication(s)/side effects and non-pharmacologic comfort measures Outcome: Progressing   Problem: Health Behavior/Discharge Planning: Goal: Ability to manage health-related needs will improve Outcome: Progressing   Problem: Clinical Measurements: Goal: Ability to maintain clinical measurements within normal limits will improve Outcome: Progressing Goal: Will remain free from infection Outcome: Progressing Goal: Diagnostic test results will improve Outcome: Progressing Goal: Respiratory complications will improve Outcome: Progressing Goal: Cardiovascular complication will be avoided Outcome: Progressing   Problem: Activity: Goal: Risk for activity intolerance will decrease Outcome: Progressing   Problem: Nutrition: Goal: Adequate nutrition will be maintained Outcome: Progressing   Problem: Coping: Goal: Level of anxiety will decrease Outcome: Progressing   Problem: Elimination: Goal: Will not experience complications related to bowel motility Outcome: Progressing Goal: Will not experience complications related to urinary retention Outcome: Progressing   Problem: Pain Managment: Goal: General experience of comfort will improve and/or be controlled Outcome: Progressing   Problem: Safety: Goal: Ability to remain free from injury will improve Outcome: Progressing   Problem: Skin Integrity: Goal: Risk for impaired skin integrity will decrease Outcome: Progressing   Problem: Education: Goal: Ability to describe self-care measures that may prevent or decrease complications (Diabetes Survival Skills Education) will improve Outcome: Progressing Goal: Individualized Educational Video(s) Outcome: Progressing   Problem: Coping: Goal: Ability to adjust to condition or change in health will improve Outcome: Progressing   Problem: Fluid  Volume: Goal: Ability to maintain a balanced intake and output will improve Outcome: Progressing   Problem: Health Behavior/Discharge Planning: Goal: Ability to identify and utilize available resources and services will improve Outcome: Progressing Goal: Ability to manage health-related needs will improve Outcome: Progressing   Problem: Metabolic: Goal: Ability to maintain appropriate glucose levels will improve Outcome: Progressing   Problem: Nutritional: Goal: Maintenance of adequate nutrition will improve Outcome: Progressing Goal: Progress toward achieving an optimal weight will improve Outcome: Progressing   Problem: Skin Integrity: Goal: Risk for impaired skin integrity will decrease Outcome: Progressing   Problem: Tissue Perfusion: Goal: Adequacy of tissue perfusion will improve Outcome: Progressing   Problem: Education: Goal: Knowledge of General Education information will improve Description: Including pain rating scale, medication(s)/side effects and non-pharmacologic comfort measures Outcome: Progressing   Problem: Health Behavior/Discharge Planning: Goal: Ability to manage health-related needs will improve Outcome: Progressing   Problem: Clinical Measurements: Goal: Ability to maintain clinical measurements within normal limits will improve Outcome: Progressing Goal: Will remain free from infection Outcome: Progressing Goal: Diagnostic test results will improve Outcome: Progressing Goal: Respiratory complications will improve Outcome: Progressing Goal: Cardiovascular complication will be avoided Outcome: Progressing   Problem: Activity: Goal: Risk for activity intolerance will decrease Outcome: Progressing   Problem: Nutrition: Goal: Adequate nutrition will be maintained Outcome: Progressing   Problem: Coping: Goal: Level of anxiety will decrease Outcome: Progressing   Problem: Elimination: Goal: Will not experience complications related to  bowel motility Outcome: Progressing Goal: Will not experience complications related to urinary retention Outcome: Progressing   Problem: Pain Managment: Goal: General experience of comfort will improve and/or be controlled Outcome: Progressing   Problem: Safety: Goal: Ability to remain free from injury will improve Outcome: Progressing   Problem: Skin Integrity: Goal: Risk for impaired skin integrity will decrease Outcome: Progressing

## 2024-01-28 NOTE — Plan of Care (Signed)
   Medical records reviewed.  Attempted to call patient's legal guardian for follow-up on board's decision after their meeting On Thursday 7/3 but was unable to reach.  Voicemail was left with PMT contact information.  PMT will continue to follow peripherally.  Thank you for your referral and allowing PMT to assist in [name]'s care.   Arin Peral, PA-C Palliative Medicine Team  Team Phone # [phone]   NO CHARGE

## 2024-01-29 DIAGNOSIS — A419 Sepsis, unspecified organism: Secondary | ICD-10-CM | POA: Diagnosis not present

## 2024-01-29 DIAGNOSIS — L89899 Pressure ulcer of other site, unspecified stage: Secondary | ICD-10-CM | POA: Diagnosis not present

## 2024-01-29 DIAGNOSIS — F0390 Unspecified dementia without behavioral disturbance: Secondary | ICD-10-CM | POA: Diagnosis not present

## 2024-01-29 DIAGNOSIS — I739 Peripheral vascular disease, unspecified: Secondary | ICD-10-CM | POA: Diagnosis not present

## 2024-01-29 LAB — GLUCOSE, CAPILLARY
Glucose-Capillary: 60 mg/dL — ABNORMAL LOW (ref 70–99)
Glucose-Capillary: 61 mg/dL — ABNORMAL LOW (ref 70–99)
Glucose-Capillary: 151 mg/dL — ABNORMAL HIGH (ref 70–99)
Glucose-Capillary: 75 mg/dL (ref 70–99)
Glucose-Capillary: 139 mg/dL — ABNORMAL HIGH (ref 70–99)

## 2024-01-29 LAB — CBC WITH DIFFERENTIAL/PLATELET
Abs Immature Granulocytes: 0.17 K/uL — ABNORMAL HIGH (ref 0.00–0.07)
Basophils Absolute: 0 K/uL (ref 0.0–0.1)
Basophils Relative: 0 %
Eosinophils Absolute: 0.1 K/uL (ref 0.0–0.5)
Eosinophils Relative: 1 %
HCT: 27.5 % — ABNORMAL LOW (ref 39.0–52.0)
Hemoglobin: 8.9 g/dL — ABNORMAL LOW (ref 13.0–17.0)
Immature Granulocytes: 1 %
Lymphocytes Relative: 21 %
Lymphs Abs: 3.6 K/uL (ref 0.7–4.0)
MCH: 29.4 pg (ref 26.0–34.0)
MCHC: 32.4 g/dL (ref 30.0–36.0)
MCV: 90.8 fL (ref 80.0–100.0)
Monocytes Absolute: 3 K/uL — ABNORMAL HIGH (ref 0.1–1.0)
Monocytes Relative: 17 %
Neutro Abs: 10.7 K/uL — ABNORMAL HIGH (ref 1.7–7.7)
Neutrophils Relative %: 60 %
Platelets: 692 K/uL — ABNORMAL HIGH (ref 150–400)
RBC: 3.03 MIL/uL — ABNORMAL LOW (ref 4.22–5.81)
RDW: 15.3 % (ref 11.5–15.5)
WBC: 17.7 K/uL — ABNORMAL HIGH (ref 4.0–10.5)
nRBC: 0 % (ref 0.0–0.2)

## 2024-01-29 LAB — BASIC METABOLIC PANEL WITH GFR
Anion gap: 9 (ref 5–15)
BUN: 17 mg/dL (ref 8–23)
CO2: 25 mmol/L (ref 22–32)
Calcium: 8.8 mg/dL — ABNORMAL LOW (ref 8.9–10.3)
Chloride: 107 mmol/L (ref 98–111)
Creatinine, Ser: 0.92 mg/dL (ref 0.61–1.24)
GFR, Estimated: >60 mL/min (ref >60)
Glucose, Bld: 142 mg/dL — ABNORMAL HIGH (ref 70–99)
Potassium: 3.9 mmol/L (ref 3.5–5.1)
Sodium: 141 mmol/L (ref 135–145)

## 2024-01-29 LAB — MAGNESIUM: Magnesium: 2 mg/dL (ref 1.7–2.4)

## 2024-01-29 MED ORDER — OXYCODONE HCL 5 MG PO TABS
5.0000 mg | ORAL_TABLET | ORAL | Status: DC | PRN
  Administered 2024-01-29 – 2024-01-31 (×3): 5 mg via ORAL
  Filled 2024-01-29 (×3): qty 1

## 2024-01-29 MED ORDER — DEXTROSE 50 % IV SOLN
12.5000 g | INTRAVENOUS | Status: AC
  Administered 2024-01-29: 12.5 g via INTRAVENOUS
  Filled 2024-01-29: qty 50

## 2024-01-29 NOTE — Progress Notes (Signed)
 Progress Note    Jacob Mcgrath.   FMW:[id number]  DOB: 03-20-1956  DOA: 01/16/2024     13 PCP: [name], MD  Initial CC: Foot wound  Hospital Course: 68 y.o. male with medical history significant for dementia, type 2 diabetes mellitus, hypertension, PAD s/p stenting, epilepsy, depression, stroke presented from LTC with report of fevers, tremors that started 6/23. He apparently had been on a 14 day course of ertapenem at SNF for diabetic foot infection/ulcer. He was noted to have fever prior to admission with necrotic appearance on foot. CT angio showed occluded left SFA stent extending into the proximal popliteal artery, occluded left posterior tibial artery to the mid calf amongst other findings.  Patient was admitted for further evaluation.     Assessment & Plan:   Sepsis from LLE cellulitis - Presented with sepsis criteria (tachycardia, leukocytosis, tachypnea, normal lactic acid, source), fever 103 F at SNF,  chronic  left lower extremity wounds which may be etiology of infection.   -Left foot x-ray-no evidence of osteomyelitis.   -UA with trace leukocytes not quite convincing as etiology -Blood cultures no growth, urine culture insignificant growth. - Adequate course of antibiotics completed for now.  He is nontoxic-appearing and afebrile. -Discontinue antibiotics for now and monitor clinically while awaiting further decisions from [facility] - WBC is trending after stopping abx; difficult situation given prolonged abx likely futile. Ideally, transition to hospice/comfort care would be more dignified and focus on quality of life; still awaiting decision from [facility]   PAD (peripheral artery disease) - severe -Surgery and vascular surgery consulted -CT angio bifemoral- Occluded left SFA stent extending into the proximal popliteal artery.  Occluded left posterior tibial artery to the mid calf. Patent left external iliac artery stent. Short-segment high-grade  stenosis mid right external iliac artery. - Per vascular surgery, now with nonviable left foot and occluded stents in the LLE, recommended AKA - Palliative medicine consulted and helping assist for GOC/amputation    Decubitus ulcers Left lower extremity decubitus ulcers-on lateral side.  Unstageable.  Black eschar present.  Possible cellulitis, but due to hyperpigmentation hard to tell, but no fluctuance or swelling appreciated.  Left foot x-ray- No acute abnormality. - s/p abx course here too Per MAR-he had been on a 14 day course of  IV Ertepenem for diabetic foot ulcer   Essential hypertension - continue Coreg     Acute metabolic encephalopathy Now more awake. Baseline dementia.  Unable to determine severity of patient's baseline dementia S/p abx course    Dementia without behavioral disturbance Delirium precautions Palliative medicine following   Epilepsy Continue oral Keppra    Controlled type 2 diabetes mellitus - HgbA1c - 5.9%    Pressure Injury 10/04/22 Sacrum Medial Stage 1 -  Intact skin with non-blanchable redness of a localized area usually over a bony prominence. (Active)  10/04/22 2011  Location: Sacrum  Location Orientation: Medial  Staging: Stage 1 -  Intact skin with non-blanchable redness of a localized area usually over a bony prominence.  Wound Description (Comments):   DO NOT USE:  Present on Admission:     Interval History:  No events overnight.  More awake and talking today.  Underlying cognitive impairment appreciated and remains baseline. Still awaiting updates regarding [facility]/guardian decisions for management.  Pain is about the same as always in his left lower extremity.  Remains contracted.   Old records reviewed in assessment of this patient  Antimicrobials: Vancomycin  01/16/2024 >> 7/3 Flagyl  01/16/2024 >> 7/3 Cefepime  01/16/2024 >>  7/3  DVT prophylaxis:  enoxaparin  (LOVENOX ) injection 40 mg Start: 01/16/24 2130   Code Status:   Code  Status: Full Code  Mobility Assessment (Last 72 Hours)     Mobility Assessment     Row Name 01/28/24 1930 01/28/24 1318 01/27/24 2000 01/27/24 0835 01/26/24 2000   Does patient have an order for bedrest or is patient medically unstable No - Continue assessment No - Continue assessment No - Continue assessment No - Continue assessment No - Continue assessment   What is the highest level of mobility based on the progressive mobility assessment? Level 2 (Chairfast) - Balance while sitting on edge of bed and cannot stand Level 2 (Chairfast) - Balance while sitting on edge of bed and cannot stand Level 2 (Chairfast) - Balance while sitting on edge of bed and cannot stand Level 2 (Chairfast) - Balance while sitting on edge of bed and cannot stand Level 2 (Chairfast) - Balance while sitting on edge of bed and cannot stand   Is the above level different from baseline mobility prior to current illness? -- -- Yes - Recommend PT order -- Yes - Recommend PT order      Barriers to discharge: TBD Disposition Plan:  TBD HH orders placed: n/a Status is: Inpt  Objective: Blood pressure 113/64, pulse 95, temperature 97.8 F (36.6 C), resp. rate 15, height 5' 9 (1.753 m), weight 65.6 kg, SpO2 99%.  Examination:  Physical Exam Constitutional:      General: He is not in acute distress.    Comments: Chronically ill-appearing adult man appearing older than stated age lying in bed in no distress  HENT:     Head: Normocephalic and atraumatic.     Mouth/Throat:     Mouth: Mucous membranes are moist.  Eyes:     Extraocular Movements: Extraocular movements intact.  Cardiovascular:     Rate and Rhythm: Normal rate and regular rhythm.  Pulmonary:     Effort: Pulmonary effort is normal. No respiratory distress.     Breath sounds: Normal breath sounds. No wheezing.  Abdominal:     General: Bowel sounds are normal. There is no distension.     Palpations: Abdomen is soft.     Tenderness: There is no abdominal  tenderness.  Musculoskeletal:     Cervical back: Normal range of motion and neck supple.     Comments: Dressings covering left lower extremity wounds and are tender to palpation  Skin:    General: Skin is warm and dry.  Neurological:     Comments: Slowed mentation and underlying dementia appreciated      Consultants:  Palliative care Vascular surgery  Procedures:    Data Reviewed: Results for orders placed or performed during the hospital encounter of 01/16/24 (from the past 24 hours)  Glucose, capillary     Status: Abnormal   Collection Time: 01/28/24  4:01 PM  Result Value Ref Range   Glucose-Capillary 125 (H) 70 - 99 mg/dL   Comment 1 Document in Chart   Glucose, capillary     Status: Abnormal   Collection Time: 01/28/24  8:27 PM  Result Value Ref Range   Glucose-Capillary 136 (H) 70 - 99 mg/dL  Basic metabolic panel with GFR     Status: Abnormal   Collection Time: 01/29/24  2:29 AM  Result Value Ref Range   Sodium 141 135 - 145 mmol/L   Potassium 3.9 3.5 - 5.1 mmol/L   Chloride 107 98 - 111 mmol/L   CO2  25 22 - 32 mmol/L   Glucose, Bld 142 (H) 70 - 99 mg/dL   BUN 17 8 - 23 mg/dL   Creatinine, Ser 9.07 0.61 - 1.24 mg/dL   Calcium  8.8 (L) 8.9 - 10.3 mg/dL   GFR, Estimated >39 >39 mL/min   Anion gap 9 5 - 15  CBC with Differential/Platelet     Status: Abnormal   Collection Time: 01/29/24  2:29 AM  Result Value Ref Range   WBC 17.7 (H) 4.0 - 10.5 K/uL   RBC 3.03 (L) 4.22 - 5.81 MIL/uL   Hemoglobin 8.9 (L) 13.0 - 17.0 g/dL   HCT 72.4 (L) 60.9 - 47.9 %   MCV 90.8 80.0 - 100.0 fL   MCH 29.4 26.0 - 34.0 pg   MCHC 32.4 30.0 - 36.0 g/dL   RDW 84.6 88.4 - 84.4 %   Platelets 692 (H) 150 - 400 K/uL   nRBC 0.0 0.0 - 0.2 %   Neutrophils Relative % 60 %   Neutro Abs 10.7 (H) 1.7 - 7.7 K/uL   Lymphocytes Relative 21 %   Lymphs Abs 3.6 0.7 - 4.0 K/uL   Monocytes Relative 17 %   Monocytes Absolute 3.0 (H) 0.1 - 1.0 K/uL   Eosinophils Relative 1 %   Eosinophils Absolute  0.1 0.0 - 0.5 K/uL   Basophils Relative 0 %   Basophils Absolute 0.0 0.0 - 0.1 K/uL   Immature Granulocytes 1 %   Abs Immature Granulocytes 0.17 (H) 0.00 - 0.07 K/uL  Magnesium      Status: None   Collection Time: 01/29/24  2:29 AM  Result Value Ref Range   Magnesium  2.0 1.7 - 2.4 mg/dL  Glucose, capillary     Status: Abnormal   Collection Time: 01/29/24  8:39 AM  Result Value Ref Range   Glucose-Capillary 60 (L) 70 - 99 mg/dL  Glucose, capillary     Status: Abnormal   Collection Time: 01/29/24 10:09 AM  Result Value Ref Range   Glucose-Capillary 61 (L) 70 - 99 mg/dL    I have reviewed pertinent nursing notes, vitals, labs, and images as necessary. I have ordered labwork to follow up on as indicated.  I have reviewed the last notes from staff over past 24 hours. I have discussed patient's care plan and test results with nursing staff, CM/SW, and other staff as appropriate.    LOS: 13 days   Alm Apo, MD [facility] 01/29/2024, 11:27 AM

## 2024-01-29 NOTE — Plan of Care (Signed)
   Problem: Nutrition: Goal: Adequate nutrition will be maintained Outcome: Progressing   Problem: Pain Managment: Goal: General experience of comfort will improve and/or be controlled Outcome: Progressing   Problem: Safety: Goal: Ability to remain free from injury will improve Outcome: Progressing

## 2024-01-29 NOTE — Plan of Care (Signed)
  Problem: Education: Goal: Knowledge of General Education information will improve Description: Including pain rating scale, medication(s)/side effects and non-pharmacologic comfort measures Outcome: Progressing   Problem: Health Behavior/Discharge Planning: Goal: Ability to manage health-related needs will improve Outcome: Progressing   Problem: Clinical Measurements: Goal: Ability to maintain clinical measurements within normal limits will improve Outcome: Progressing Goal: Will remain free from infection Outcome: Progressing Goal: Diagnostic test results will improve Outcome: Progressing Goal: Respiratory complications will improve Outcome: Progressing Goal: Cardiovascular complication will be avoided Outcome: Progressing   Problem: Activity: Goal: Risk for activity intolerance will decrease Outcome: Progressing   Problem: Nutrition: Goal: Adequate nutrition will be maintained Outcome: Progressing   Problem: Coping: Goal: Level of anxiety will decrease Outcome: Progressing   Problem: Elimination: Goal: Will not experience complications related to bowel motility Outcome: Progressing Goal: Will not experience complications related to urinary retention Outcome: Progressing   Problem: Pain Managment: Goal: General experience of comfort will improve and/or be controlled Outcome: Progressing   Problem: Safety: Goal: Ability to remain free from injury will improve Outcome: Progressing   Problem: Skin Integrity: Goal: Risk for impaired skin integrity will decrease Outcome: Progressing   Problem: Education: Goal: Ability to describe self-care measures that may prevent or decrease complications (Diabetes Survival Skills Education) will improve Outcome: Progressing Goal: Individualized Educational Video(s) Outcome: Progressing   Problem: Coping: Goal: Ability to adjust to condition or change in health will improve Outcome: Progressing   Problem: Fluid  Volume: Goal: Ability to maintain a balanced intake and output will improve Outcome: Progressing   Problem: Health Behavior/Discharge Planning: Goal: Ability to identify and utilize available resources and services will improve Outcome: Progressing Goal: Ability to manage health-related needs will improve Outcome: Progressing   Problem: Metabolic: Goal: Ability to maintain appropriate glucose levels will improve Outcome: Progressing   Problem: Nutritional: Goal: Maintenance of adequate nutrition will improve Outcome: Progressing Goal: Progress toward achieving an optimal weight will improve Outcome: Progressing   Problem: Skin Integrity: Goal: Risk for impaired skin integrity will decrease Outcome: Progressing   Problem: Tissue Perfusion: Goal: Adequacy of tissue perfusion will improve Outcome: Progressing   Problem: Education: Goal: Knowledge of General Education information will improve Description: Including pain rating scale, medication(s)/side effects and non-pharmacologic comfort measures Outcome: Progressing   Problem: Health Behavior/Discharge Planning: Goal: Ability to manage health-related needs will improve Outcome: Progressing   Problem: Clinical Measurements: Goal: Ability to maintain clinical measurements within normal limits will improve Outcome: Progressing Goal: Will remain free from infection Outcome: Progressing Goal: Diagnostic test results will improve Outcome: Progressing Goal: Respiratory complications will improve Outcome: Progressing Goal: Cardiovascular complication will be avoided Outcome: Progressing   Problem: Activity: Goal: Risk for activity intolerance will decrease Outcome: Progressing   Problem: Nutrition: Goal: Adequate nutrition will be maintained Outcome: Progressing   Problem: Coping: Goal: Level of anxiety will decrease Outcome: Progressing   Problem: Elimination: Goal: Will not experience complications related to  bowel motility Outcome: Progressing Goal: Will not experience complications related to urinary retention Outcome: Progressing   Problem: Pain Managment: Goal: General experience of comfort will improve and/or be controlled Outcome: Progressing   Problem: Safety: Goal: Ability to remain free from injury will improve Outcome: Progressing   Problem: Skin Integrity: Goal: Risk for impaired skin integrity will decrease Outcome: Progressing

## 2024-01-30 DIAGNOSIS — L97529 Non-pressure chronic ulcer of other part of left foot with unspecified severity: Secondary | ICD-10-CM | POA: Diagnosis not present

## 2024-01-30 DIAGNOSIS — F0390 Unspecified dementia without behavioral disturbance: Secondary | ICD-10-CM | POA: Diagnosis not present

## 2024-01-30 DIAGNOSIS — Z7189 Other specified counseling: Secondary | ICD-10-CM | POA: Diagnosis not present

## 2024-01-30 DIAGNOSIS — A419 Sepsis, unspecified organism: Secondary | ICD-10-CM | POA: Diagnosis not present

## 2024-01-30 DIAGNOSIS — I739 Peripheral vascular disease, unspecified: Secondary | ICD-10-CM | POA: Diagnosis not present

## 2024-01-30 LAB — CBC WITH DIFFERENTIAL/PLATELET
Abs Immature Granulocytes: 0.18 K/uL — ABNORMAL HIGH (ref 0.00–0.07)
Basophils Absolute: 0 K/uL (ref 0.0–0.1)
Basophils Relative: 0 %
Eosinophils Absolute: 0.1 K/uL (ref 0.0–0.5)
Eosinophils Relative: 1 %
HCT: 29 % — ABNORMAL LOW (ref 39.0–52.0)
Hemoglobin: 8.9 g/dL — ABNORMAL LOW (ref 13.0–17.0)
Immature Granulocytes: 1 %
Lymphocytes Relative: 16 %
Lymphs Abs: 2.8 K/uL (ref 0.7–4.0)
MCH: 28 pg (ref 26.0–34.0)
MCHC: 30.7 g/dL (ref 30.0–36.0)
MCV: 91.2 fL (ref 80.0–100.0)
Monocytes Absolute: 2.5 K/uL — ABNORMAL HIGH (ref 0.1–1.0)
Monocytes Relative: 14 %
Neutro Abs: 11.9 K/uL — ABNORMAL HIGH (ref 1.7–7.7)
Neutrophils Relative %: 68 %
Platelets: 706 K/uL — ABNORMAL HIGH (ref 150–400)
RBC: 3.18 MIL/uL — ABNORMAL LOW (ref 4.22–5.81)
RDW: 15 % (ref 11.5–15.5)
WBC: 17.5 K/uL — ABNORMAL HIGH (ref 4.0–10.5)
nRBC: 0 % (ref 0.0–0.2)

## 2024-01-30 LAB — GLUCOSE, CAPILLARY
Glucose-Capillary: 119 mg/dL — ABNORMAL HIGH (ref 70–99)
Glucose-Capillary: 110 mg/dL — ABNORMAL HIGH (ref 70–99)
Glucose-Capillary: 151 mg/dL — ABNORMAL HIGH (ref 70–99)
Glucose-Capillary: 120 mg/dL — ABNORMAL HIGH (ref 70–99)
Glucose-Capillary: 159 mg/dL — ABNORMAL HIGH (ref 70–99)

## 2024-01-30 LAB — BASIC METABOLIC PANEL WITH GFR
Anion gap: 10 (ref 5–15)
BUN: 17 mg/dL (ref 8–23)
CO2: 27 mmol/L (ref 22–32)
Calcium: 9.2 mg/dL (ref 8.9–10.3)
Chloride: 103 mmol/L (ref 98–111)
Creatinine, Ser: 0.8 mg/dL (ref 0.61–1.24)
GFR, Estimated: >60 mL/min (ref >60)
Glucose, Bld: 125 mg/dL — ABNORMAL HIGH (ref 70–99)
Potassium: 4.1 mmol/L (ref 3.5–5.1)
Sodium: 140 mmol/L (ref 135–145)

## 2024-01-30 LAB — MAGNESIUM: Magnesium: 1.8 mg/dL (ref 1.7–2.4)

## 2024-01-30 NOTE — Progress Notes (Signed)
 Progress Note    Jacob Mcgrath.   FMW:[id number]  DOB: 1955/12/05  DOA: 01/16/2024     14 PCP: [name], MD  Initial CC: Foot wound  Hospital Course: 68 y.o. male with medical history significant for dementia, type 2 diabetes mellitus, hypertension, PAD s/p stenting, epilepsy, depression, stroke presented from LTC with report of fevers, tremors that started 6/23. He apparently had been on a 14 day course of ertapenem at SNF for diabetic foot infection/ulcer. He was noted to have fever prior to admission with necrotic appearance on foot. CT angio showed occluded left SFA stent extending into the proximal popliteal artery, occluded left posterior tibial artery to the mid calf amongst other findings.  Patient was admitted for further evaluation.     Assessment & Plan:   Sepsis from LLE cellulitis - Presented with sepsis criteria (tachycardia, leukocytosis, tachypnea, normal lactic acid, source), fever 103 F at SNF,  chronic  left lower extremity wounds which may be etiology of infection.   -Left foot x-ray-no evidence of osteomyelitis.   -UA with trace leukocytes not quite convincing as etiology -Blood cultures no growth, urine culture insignificant growth. - Adequate course of antibiotics completed for now.  He is nontoxic-appearing and afebrile. -Discontinue antibiotics for now and monitor clinically while awaiting further decisions from [facility] - WBC is trending after stopping abx; difficult situation given prolonged abx likely futile. Ideally, transition to hospice/comfort care would be more dignified and focus on quality of life; still awaiting decision from [facility]   PAD (peripheral artery disease) - severe -Surgery and vascular surgery consulted -CT angio bifemoral- Occluded left SFA stent extending into the proximal popliteal artery.  Occluded left posterior tibial artery to the mid calf. Patent left external iliac artery stent. Short-segment high-grade  stenosis mid right external iliac artery. - Per vascular surgery, now with nonviable left foot and occluded stents in the LLE, recommended AKA - Palliative medicine consulted and helping assist for GOC/amputation    Decubitus ulcers Left lower extremity decubitus ulcers-on lateral side.  Unstageable.  Black eschar present.  Possible cellulitis, but due to hyperpigmentation hard to tell, but no fluctuance or swelling appreciated.  Left foot x-ray- No acute abnormality. - s/p abx course here too Per MAR-he had been on a 14 day course of  IV Ertepenem for diabetic foot ulcer   Essential hypertension - continue Coreg     Acute metabolic encephalopathy Now more awake. Baseline dementia.  Unable to determine severity of patient's baseline dementia S/p abx course    Dementia without behavioral disturbance Delirium precautions Palliative medicine following   Epilepsy Continue oral Keppra    Controlled type 2 diabetes mellitus - HgbA1c - 5.9%    Pressure Injury 10/04/22 Sacrum Medial Stage 1 -  Intact skin with non-blanchable redness of a localized area usually over a bony prominence. (Active)  10/04/22 2011  Location: Sacrum  Location Orientation: Medial  Staging: Stage 1 -  Intact skin with non-blanchable redness of a localized area usually over a bony prominence.  Wound Description (Comments):   DO NOT USE:  Present on Admission:     Interval History:  No events overnight.  Underlying cognitive impairment appreciated and remains baseline. Still awaiting updates regarding [facility]/guardian decisions for management.  Pain is about the same as always in his left lower extremity.  Remains contracted. Left leg dressing changed by nurse this morning.   Old records reviewed in assessment of this patient  Antimicrobials: Vancomycin  01/16/2024 >> 7/3 Flagyl  01/16/2024 >> 7/3  Cefepime  01/16/2024 >> 7/3  DVT prophylaxis:  enoxaparin  (LOVENOX ) injection 40 mg Start: 01/16/24 2130   Code  Status:   Code Status: Full Code  Mobility Assessment (Last 72 Hours)     Mobility Assessment     Row Name 01/30/24 0800 01/29/24 2130 01/29/24 0930 01/28/24 1930 01/28/24 1318   Does patient have an order for bedrest or is patient medically unstable No - Continue assessment No - Continue assessment No - Continue assessment No - Continue assessment No - Continue assessment   What is the highest level of mobility based on the progressive mobility assessment? Level 1 (Bedfast) - Unable to balance while sitting on edge of bed Level 1 (Bedfast) - Unable to balance while sitting on edge of bed Level 1 (Bedfast) - Unable to balance while sitting on edge of bed Level 2 (Chairfast) - Balance while sitting on edge of bed and cannot stand Level 2 (Chairfast) - Balance while sitting on edge of bed and cannot stand   Is the above level different from baseline mobility prior to current illness? -- Yes - Recommend PT order -- -- --    Row Name 01/27/24 2000           Does patient have an order for bedrest or is patient medically unstable No - Continue assessment       What is the highest level of mobility based on the progressive mobility assessment? Level 2 (Chairfast) - Balance while sitting on edge of bed and cannot stand       Is the above level different from baseline mobility prior to current illness? Yes - Recommend PT order          Barriers to discharge: TBD Disposition Plan:  TBD HH orders placed: n/a Status is: Inpt  Objective: Blood pressure 132/68, pulse (!) 109, temperature 98.1 F (36.7 C), resp. rate 19, height 5' 9 (1.753 m), weight 65.6 kg, SpO2 93%.  Examination:  Physical Exam Constitutional:      General: He is not in acute distress.    Comments: Chronically ill-appearing adult man appearing older than stated age lying in bed in no distress  HENT:     Head: Normocephalic and atraumatic.     Mouth/Throat:     Mouth: Mucous membranes are moist.  Eyes:     Extraocular  Movements: Extraocular movements intact.  Cardiovascular:     Rate and Rhythm: Normal rate and regular rhythm.  Pulmonary:     Effort: Pulmonary effort is normal. No respiratory distress.     Breath sounds: Normal breath sounds. No wheezing.  Abdominal:     General: Bowel sounds are normal. There is no distension.     Palpations: Abdomen is soft.     Tenderness: There is no abdominal tenderness.  Musculoskeletal:     Cervical back: Normal range of motion and neck supple.     Comments: Dressings covering left lower extremity wounds and are tender to palpation  Skin:    General: Skin is warm and dry.  Neurological:     Comments: Slowed mentation and underlying dementia appreciated   Pic taken 01/30/24:   Consultants:  Palliative care Vascular surgery  Procedures:    Data Reviewed: Results for orders placed or performed during the hospital encounter of 01/16/24 (from the past 24 hours)  Glucose, capillary     Status: None   Collection Time: 01/29/24  4:48 PM  Result Value Ref Range   Glucose-Capillary 75 70 - 99 mg/dL  Glucose, capillary     Status: Abnormal   Collection Time: 01/29/24  9:02 PM  Result Value Ref Range   Glucose-Capillary 139 (H) 70 - 99 mg/dL  Basic metabolic panel with GFR     Status: Abnormal   Collection Time: 01/30/24  7:26 AM  Result Value Ref Range   Sodium 140 135 - 145 mmol/L   Potassium 4.1 3.5 - 5.1 mmol/L   Chloride 103 98 - 111 mmol/L   CO2 27 22 - 32 mmol/L   Glucose, Bld 125 (H) 70 - 99 mg/dL   BUN 17 8 - 23 mg/dL   Creatinine, Ser 9.19 0.61 - 1.24 mg/dL   Calcium  9.2 8.9 - 10.3 mg/dL   GFR, Estimated >39 >39 mL/min   Anion gap 10 5 - 15  CBC with Differential/Platelet     Status: Abnormal   Collection Time: 01/30/24  7:26 AM  Result Value Ref Range   WBC 17.5 (H) 4.0 - 10.5 K/uL   RBC 3.18 (L) 4.22 - 5.81 MIL/uL   Hemoglobin 8.9 (L) 13.0 - 17.0 g/dL   HCT 70.9 (L) 60.9 - 47.9 %   MCV 91.2 80.0 - 100.0 fL   MCH 28.0 26.0 - 34.0 pg    MCHC 30.7 30.0 - 36.0 g/dL   RDW 84.9 88.4 - 84.4 %   Platelets 706 (H) 150 - 400 K/uL   nRBC 0.0 0.0 - 0.2 %   Neutrophils Relative % 68 %   Neutro Abs 11.9 (H) 1.7 - 7.7 K/uL   Lymphocytes Relative 16 %   Lymphs Abs 2.8 0.7 - 4.0 K/uL   Monocytes Relative 14 %   Monocytes Absolute 2.5 (H) 0.1 - 1.0 K/uL   Eosinophils Relative 1 %   Eosinophils Absolute 0.1 0.0 - 0.5 K/uL   Basophils Relative 0 %   Basophils Absolute 0.0 0.0 - 0.1 K/uL   Immature Granulocytes 1 %   Abs Immature Granulocytes 0.18 (H) 0.00 - 0.07 K/uL  Magnesium      Status: None   Collection Time: 01/30/24  7:26 AM  Result Value Ref Range   Magnesium  1.8 1.7 - 2.4 mg/dL  Glucose, capillary     Status: Abnormal   Collection Time: 01/30/24  7:38 AM  Result Value Ref Range   Glucose-Capillary 119 (H) 70 - 99 mg/dL  Glucose, capillary     Status: Abnormal   Collection Time: 01/30/24  8:37 AM  Result Value Ref Range   Glucose-Capillary 110 (H) 70 - 99 mg/dL  Glucose, capillary     Status: Abnormal   Collection Time: 01/30/24 11:24 AM  Result Value Ref Range   Glucose-Capillary 151 (H) 70 - 99 mg/dL    I have reviewed pertinent nursing notes, vitals, labs, and images as necessary. I have ordered labwork to follow up on as indicated.  I have reviewed the last notes from staff over past 24 hours. I have discussed patient's care plan and test results with nursing staff, CM/SW, and other staff as appropriate.    LOS: 14 days   Alm Apo, MD [facility] 01/30/2024, 1:03 PM

## 2024-01-30 NOTE — TOC Progression Note (Signed)
 Transition of Care (TOC) - Progression Note    Patient Details  Name: Edith Lord. MRN: [id number] Date of Birth: [birth year]  Transition of Care [facility]) CM/SW Contact  Inocente GORMAN Kindle, LCSW Phone Number: 01/30/2024, 2:13 PM  Clinical Narrative:    TOC continuing to follow for needs.   Expected Discharge Plan: TBD Barriers to Discharge: Continued Medical Work up  Expected Discharge Plan and Services     Post Acute Care Choice: Skilled Nursing Facility Living arrangements for the past 2 months: Skilled Nursing Facility                                       Social Determinants of Health (SDOH) Interventions SDOH Screenings   Food Insecurity: Patient Unable To Answer (01/16/2024)  Housing: Patient Unable To Answer (01/16/2024)  Transportation Needs: Patient Unable To Answer (01/16/2024)  Utilities: Patient Unable To Answer (01/16/2024)  Social Connections: Patient Unable To Answer (01/16/2024)  Tobacco Use: High Risk (01/16/2024)    Readmission Risk Interventions    01/17/2024   10:20 AM 08/01/2023    3:31 PM 03/01/2023    2:40 PM  Readmission Risk Prevention Plan  Transportation Screening Complete Complete Complete  PCP or Specialist Appt within 5-7 Days  Complete Not Complete  Home Care Screening  Complete Complete  Medication Review (RN CM)  Complete Complete  HRI or Home Care Consult Complete    Social Work Consult for Recovery Care Planning/Counseling Complete    Palliative Care Screening Not Applicable    Medication Review Oceanographer) Complete

## 2024-01-30 NOTE — Plan of Care (Signed)

## 2024-01-30 NOTE — Progress Notes (Addendum)
 Daily Progress Note   Patient Name: Jacob Mcgrath.       Date: 01/30/2024 DOB: 1955/12/13  Age: 68 y.o. MRN#: [id number] Attending Physician: Patsy Lenis, MD Primary Care Physician: [name], MD Admit Date: 01/16/2024  Reason for Consultation/Follow-up: Establishing goals of care  Patient Profile/HPI:   Palliative Care consult requested for goals of care discussion in this 68 y.o. male  with past medical history of  diabetes, hypertension, CVA, dementia, depression, status epilepticus. Recent hospitalization on 5/15-5/23 for status epilepticus requiring intubation for seizure control. He was admitted on 01/16/2024 via EMS from [facility] with fever and tremors. Tmax morning of arrival was 103. Patient given Tylenol  per facility.  Patient recently on 14 day antibiotic for diabetic foot ulceration. During work-up noted to have necrotic areas on feet. Imaging negative for osteomyelitis. Started on IV antibiotics. 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   Subjective: Chart reviewed including labs, progress notes, imaging from this and previous encounters.  Noted WBC trending up. Agree that prolonged antibiotics is not definitive treatment and will not improve quality of life or mortality risk.   On evaluation patient is awake and more interactive than my previous visits. He denies pain.  I called guardianGLENWOOD Fleeting and left message requesting return call re: decision making.      Review of Systems  Unable to perform ROS: Mental status change     Physical Exam Vitals and nursing note reviewed.  Constitutional:      Appearance: He is ill-appearing.  Cardiovascular:     Rate and Rhythm: Normal rate.  Pulmonary:      Effort: Pulmonary effort is normal.  Neurological:     Mental Status: He is disoriented.             Vital Signs: BP 132/68 (BP Location: Right Arm)   Pulse (!) 109   Temp 98.1 F (36.7 C)   Resp 19   Ht 5' 9 (1.753 m)   Wt 65.6 kg   SpO2 93%   BMI 21.36 kg/m  SpO2: SpO2: 93 % O2 Device: O2 Device: Room Air O2 Flow Rate: O2 Flow Rate (L/min): 2 L/min  Intake/output summary:  Intake/Output Summary (Last 24 hours) at 01/30/2024 1307 Last data filed at 01/30/2024 0936 Gross per 24  hour  Intake 236 ml  Output 825 ml  Net -589 ml   LBM: Last BM Date : 01/24/24 Baseline Weight: Weight: 62.3 kg Most recent weight: Weight: 65.6 kg       Palliative Assessment/Data: PPS: 30%      Patient Active Problem List   Diagnosis Date Noted   Ulcer of left foot (HCC) 01/17/2024   Decubitus ulcers 01/16/2024   PAD (peripheral artery disease) (HCC) 01/16/2024   Malnutrition of moderate degree 12/10/2023   Hypomagnesemia 08/02/2023   Community acquired pneumonia of right lower lobe of lung 08/02/2023   Dementia without behavioral disturbance (HCC) 08/02/2023   Essential hypertension 08/02/2023   Status epilepticus (HCC) 07/29/2023   Seizure disorder (HCC) 07/02/2023   Stroke (HCC) 07/01/2023   Leukocytosis 07/01/2023   Tremor 02/27/2023   GERD (gastroesophageal reflux disease) 02/27/2023   Depression 02/27/2023   Protein-calorie malnutrition, severe 10/07/2022   Ischemic leg 10/01/2022   Hypertension 10/01/2022   Controlled type 2 diabetes mellitus without complication, without long-term current use of insulin  (HCC) 10/01/2022    Palliative Care Assessment & Plan    Assessment/Recommendations/Plan  Continue current interventions Completed forms received and emailed to [name]- patient's guardian at [facility]- continue to await her return call with final decisions   Addendum- received call from guardian- [name]- committee has decided to proceed with surgery, denied  change of Code Status- continue full scope, full code   Code Status:   Code Status: Full Code   Prognosis:  Unable to determine  Discharge Planning: To Be Determined  Care plan was discussed with legal guardian- [name], attending team, and vascular surgery  Thank you for allowing the Palliative Medicine Team to assist in the care of this patient.   Prolonged billing:  Time includes:   Preparing to see the patient (e.g., review of tests) Obtaining and/or reviewing separately obtained history Performing a medically necessary appropriate examination and/or evaluation Counseling and educating the patient/family/caregiver Ordering medications, tests, or procedures Referring and communicating with other health care professionals (when not reported separately) Documenting clinical information in the electronic or other health record Independently interpreting results (not reported separately) and communicating results to the patient/family/caregiver Care coordination (not reported separately) Clinical documentation  Cassondra Stain, AGNP-C Palliative Medicine   Please contact Palliative Medicine Team phone at [phone] for questions and concerns.

## 2024-01-30 NOTE — Plan of Care (Signed)
 Decision from guardianship committee noted. My medical opinion is not in agreement and I have ethical concerns regarding the decision also.  I have called Kayla and will re-submit more documentation for re-submission to the committee to review before we proceed with any surgical interventions.   Alm Apo, MD [facility] 01/30/2024, 3:45 PM

## 2024-01-30 NOTE — Plan of Care (Signed)
  Problem: Education: Goal: Knowledge of General Education information will improve Description: Including pain rating scale, medication(s)/side effects and non-pharmacologic comfort measures 01/30/2024 1949 by Ebbie Docia CROME, RN Outcome: Progressing 01/30/2024 1949 by Ebbie Docia CROME, RN Outcome: Progressing   Problem: Activity: Goal: Risk for activity intolerance will decrease 01/30/2024 1949 by Ebbie Docia CROME, RN Outcome: Progressing 01/30/2024 1949 by Ebbie Docia CROME, RN Outcome: Progressing   Problem: Nutrition: Goal: Adequate nutrition will be maintained 01/30/2024 1949 by Ebbie Docia CROME, RN Outcome: Progressing 01/30/2024 1949 by Ebbie Docia CROME, RN Outcome: Progressing   Problem: Coping: Goal: Level of anxiety will decrease 01/30/2024 1949 by Ebbie Docia CROME, RN Outcome: Progressing 01/30/2024 1949 by Ebbie Docia CROME, RN Outcome: Progressing   Problem: Safety: Goal: Ability to remain free from injury will improve 01/30/2024 1949 by Ebbie Docia CROME, RN Outcome: Progressing 01/30/2024 1949 by Ebbie Docia CROME, RN Outcome: Progressing

## 2024-01-31 ENCOUNTER — Encounter (HOSPITAL_COMMUNITY): Payer: Self-pay | Admitting: Internal Medicine

## 2024-01-31 DIAGNOSIS — A419 Sepsis, unspecified organism: Secondary | ICD-10-CM | POA: Diagnosis not present

## 2024-01-31 DIAGNOSIS — Z7189 Other specified counseling: Secondary | ICD-10-CM | POA: Diagnosis not present

## 2024-01-31 DIAGNOSIS — I739 Peripheral vascular disease, unspecified: Secondary | ICD-10-CM | POA: Diagnosis not present

## 2024-01-31 DIAGNOSIS — I70222 Atherosclerosis of native arteries of extremities with rest pain, left leg: Secondary | ICD-10-CM

## 2024-01-31 DIAGNOSIS — L89899 Pressure ulcer of other site, unspecified stage: Secondary | ICD-10-CM | POA: Diagnosis not present

## 2024-01-31 DIAGNOSIS — F0390 Unspecified dementia without behavioral disturbance: Secondary | ICD-10-CM | POA: Diagnosis not present

## 2024-01-31 LAB — GLUCOSE, CAPILLARY
Glucose-Capillary: 102 mg/dL — ABNORMAL HIGH (ref 70–99)
Glucose-Capillary: 160 mg/dL — ABNORMAL HIGH (ref 70–99)
Glucose-Capillary: 202 mg/dL — ABNORMAL HIGH (ref 70–99)

## 2024-01-31 MED ORDER — LORAZEPAM 1 MG PO TABS: 1.0000 mg | ORAL_TABLET | Freq: Three times a day (TID) | ORAL | 0 refills | Status: DC | PRN

## 2024-01-31 MED ORDER — OXYCODONE HCL 10 MG PO TABS: 10.0000 mg | ORAL_TABLET | ORAL | 0 refills | Status: DC | PRN

## 2024-01-31 MED ORDER — POLYETHYLENE GLYCOL 3350 17 G PO PACK: 17.0000 g | PACK | Freq: Every day | ORAL | Status: DC | PRN

## 2024-01-31 NOTE — NC FL2 (Signed)
 [place]  MEDICAID FL2 LEVEL OF CARE FORM     IDENTIFICATION  Patient Name: Jacob Mcgrath. Birthdate: 1956-01-19 Sex: male Admission Date (Current Location): 01/16/2024  [place] and [place] Number:  [employer] and Address:  [facility] [place], [address], [place], [place] [address]      Provider Number: [id number]  Attending Physician Name and Address:  Patsy Lenis, MD  Relative Name and Phone Number:       Current Level of Care: Hospital Recommended Level of Care: Skilled Nursing Facility Prior Approval Number:    Date Approved/Denied:   PASRR Number: [id number]  Discharge Plan: SNF    Current Diagnoses: Patient Active Problem List   Diagnosis Date Noted   Ulcer of left foot (HCC) 01/17/2024   Decubitus ulcers 01/16/2024   PAD (peripheral artery disease) (HCC) 01/16/2024   Malnutrition of moderate degree 12/10/2023   Hypomagnesemia 08/02/2023   Community acquired pneumonia of right lower lobe of lung 08/02/2023   Dementia without behavioral disturbance (HCC) 08/02/2023   Essential hypertension 08/02/2023   Status epilepticus (HCC) 07/29/2023   Seizure disorder (HCC) 07/02/2023   Stroke (HCC) 07/01/2023   Leukocytosis 07/01/2023   Tremor 02/27/2023   GERD (gastroesophageal reflux disease) 02/27/2023   Depression 02/27/2023   Protein-calorie malnutrition, severe 10/07/2022   Ischemic leg 10/01/2022   Hypertension 10/01/2022   Controlled type 2 diabetes mellitus without complication, without long-term current use of insulin  (HCC) 10/01/2022    Orientation RESPIRATION BLADDER Height & Weight      (Disoriented  x4)  Normal Incontinent, External catheter Weight: 144 lb 10 oz (65.6 kg) Height:  5' 9 (175.3 cm)  BEHAVIORAL SYMPTOMS/MOOD NEUROLOGICAL BOWEL NUTRITION STATUS    Convulsions/Seizures Continent Diet (See dc summary)  AMBULATORY STATUS COMMUNICATION OF NEEDS Skin   Total Care Verbally Other (Comment) (diabetic  ulcer on heel and ankle, foot and tibial)                       Personal Care Assistance Level of Assistance  Bathing, Feeding, Dressing Bathing Assistance: Maximum assistance Feeding assistance: Maximum assistance Dressing Assistance: Maximum assistance     Functional Limitations Info  Sight Sight Info: Impaired        SPECIAL CARE FACTORS FREQUENCY                       Contractures Contractures Info: Not present    Additional Factors Info  Code Status, Allergies Code Status Info: DNR Allergies Info: Amoxicillin, Ampicillin, Tetracyclines & Related           Current Medications (01/31/2024):  This is the current hospital active medication list Current Facility-Administered Medications  Medication Dose Route Frequency Provider Last Rate Last Admin   (feeding supplement) PROSource Plus liquid 30 mL  30 mL Oral BID BM Johnson, Clanford L, MD   30 mL at 01/31/24 1015   acetaminophen  (TYLENOL ) tablet 650 mg  650 mg Oral Q6H PRN Emokpae, Ejiroghene E, MD   650 mg at 01/30/24 2205   Or   acetaminophen  (TYLENOL ) suppository 650 mg  650 mg Rectal Q6H PRN Emokpae, Ejiroghene E, MD       aspirin  EC tablet 81 mg  81 mg Oral Daily Johnson, Clanford L, MD   81 mg at 01/31/24 1016   atorvastatin  (LIPITOR ) tablet 80 mg  80 mg Oral QHS Johnson, Clanford L, MD   80 mg at 01/30/24 2206   baclofen  (  LIORESAL ) tablet 5 mg  5 mg Oral TID Vicci, Clanford L, MD   5 mg at 01/31/24 1016   carvedilol  (COREG ) tablet 6.25 mg  6.25 mg Oral BID WC Johnson, Clanford L, MD   6.25 mg at 01/31/24 1016   Chlorhexidine  Gluconate Cloth 2 % PADS 6 each  6 each Topical Daily Emokpae, Ejiroghene E, MD   6 each at 01/28/24 0836   clopidogrel  (PLAVIX ) tablet 75 mg  75 mg Oral Q breakfast Emokpae, Ejiroghene E, MD   75 mg at 01/31/24 1016   DULoxetine  (CYMBALTA ) DR capsule 30 mg  30 mg Oral Daily Johnson, Clanford L, MD   30 mg at 01/31/24 1016   enoxaparin  (LOVENOX ) injection 40 mg  40 mg Subcutaneous  Q24H Emokpae, Ejiroghene E, MD   40 mg at 01/30/24 2206   levETIRAcetam  (KEPPRA ) tablet 1,500 mg  1,500 mg Oral BID Emokpae, Ejiroghene E, MD   1,500 mg at 01/31/24 1016   melatonin tablet 6 mg  6 mg Oral QHS Johnson, Clanford L, MD   6 mg at 01/30/24 2205   multivitamin with minerals tablet 1 tablet  1 tablet Oral Daily Vicci, Clanford L, MD   1 tablet at 01/31/24 1016   mupirocin  ointment (BACTROBAN ) 2 %   Nasal BID Chen, Lydia D, [facility]   Given at 01/31/24 1015   ondansetron  (ZOFRAN ) tablet 4 mg  4 mg Oral Q6H PRN Emokpae, Ejiroghene E, MD       Or   ondansetron  (ZOFRAN ) injection 4 mg  4 mg Intravenous Q6H PRN Emokpae, Ejiroghene E, MD       oxyCODONE  (Oxy IR/ROXICODONE ) immediate release tablet 5 mg  5 mg Oral Q4H PRN Patsy Lenis, MD   5 mg at 01/30/24 1029   pantoprazole  (PROTONIX ) EC tablet 40 mg  40 mg Oral Daily Johnson, Clanford L, MD   40 mg at 01/31/24 1016   PARoxetine  (PAXIL ) tablet 40 mg  40 mg Oral Daily Johnson, Clanford L, MD   40 mg at 01/31/24 1016   polyethylene glycol (MIRALAX  / GLYCOLAX ) packet 17 g  17 g Oral Daily PRN Emokpae, Ejiroghene E, MD   17 g at 01/30/24 1030   tamsulosin  (FLOMAX ) capsule 0.4 mg  0.4 mg Oral Daily Johnson, Clanford L, MD   0.4 mg at 01/31/24 1016   valproic  acid (DEPAKENE ) 250 MG/5ML solution 750 mg  750 mg Oral BID Emokpae, Ejiroghene E, MD   750 mg at 01/31/24 1016     Discharge Medications: Please see discharge summary for a list of discharge medications.  Relevant Imaging Results:  Relevant Lab Results:   Additional Information SS#: [id number]. Hospice to follow at SNF.  Glennie Bose S Laconya Clere, LCSW

## 2024-01-31 NOTE — Discharge Summary (Signed)
 Physician Discharge Summary   Jacob Mcgrath. FMW:[id number] DOB: 12-24-1955 DOA: 01/16/2024  PCP: [name], MD  Admit date: 01/16/2024 Discharge date: 01/31/2024  Admitted From: [facility] Disposition:  [facility] Discharging physician: Alm Apo, MD Barriers to discharge: none  Recommendations at discharge: Continue Hospice upon return. Plan is comfort care/end of life care   Discharge Condition: stable CODE STATUS: DNR - Comfort care Diet recommendation:  Diet Orders (From admission, onward)     Start     Ordered   01/30/24 1033  DIET DYS 3 Room service appropriate? Yes; Fluid consistency: Thin  Diet effective now       Comments: Automatic trays  Question Answer Comment  Room service appropriate? Yes   Fluid consistency: Thin      01/30/24 1032            Hospital Course: 68 y.o. male with medical history significant for dementia, type 2 diabetes mellitus, hypertension, PAD s/p stenting, epilepsy, depression, stroke presented from LTC with report of fevers, tremors that started 6/23. He apparently had been on a 14 day course of ertapenem at SNF for diabetic foot infection/ulcer. He was noted to have fever prior to admission with necrotic appearance on foot. CT angio showed occluded left SFA stent extending into the proximal popliteal artery, occluded left posterior tibial artery to the mid calf amongst other findings.  Patient was admitted for further evaluation.     Assessment & Plan:   Sepsis from LLE cellulitis - Presented with sepsis criteria (tachycardia, leukocytosis, tachypnea, normal lactic acid, source), fever 103 F at SNF,  chronic  left lower extremity wounds which may be etiology of infection.   -Left foot x-ray-no evidence of osteomyelitis.   -UA with trace leukocytes not quite convincing as etiology -Blood cultures no growth, urine culture insignificant growth. - Adequate course of antibiotics completed for now.  He is  nontoxic-appearing and afebrile. -Discontinue antibiotics for now and monitor clinically while awaiting further decisions from [facility] - after discussion with guardianship committee and approval, patient is now being transitioned to DNR/DNI status and pursuing comfort care only with plans to continue patient with hospice upon return to [facility] - pain control and comfort should be main goal in treatment of PAD going forward  PAD (peripheral artery disease) - severe -Surgery and vascular surgery consulted -CT angio bifemoral- Occluded left SFA stent extending into the proximal popliteal artery.  Occluded left posterior tibial artery to the mid calf. Patent left external iliac artery stent. Short-segment high-grade stenosis mid right external iliac artery. - Per vascular surgery, now with nonviable left foot and occluded stents in the LLE. If aggressive care were pursued then he would require AKA, however this was considered not in patient's best interest and would still result in patient becoming bedbound and not decrease suffering nor increase his QOL.  Case was discussed with guardianship committee and after further review, they are in agreement for transitioning patient to DNR/DNI and comfort care with hospice - Palliative medicine also assisted greatly during hospitalization, appreciate assistance - continue pain control - no further abx courses in pursuit of hospice level comfort care   Decubitus ulcers Left lower extremity decubitus ulcers-on lateral side.  Unstageable.  Black eschar present.  Possible cellulitis, but due to hyperpigmentation hard to tell, but no fluctuance or swelling appreciated.  Left foot x-ray- No acute abnormality. - s/p abx course here too Per MAR-he had been on a 14 day course of  IV Ertepenem for  diabetic foot ulcer   Essential hypertension - continue Coreg     Acute metabolic encephalopathy Now more awake. Baseline dementia.  Unable to determine severity of  patient's baseline dementia S/p abx course    Dementia without behavioral disturbance Delirium precautions Palliative medicine following   Epilepsy Continue oral Keppra    Controlled type 2 diabetes mellitus - HgbA1c - 5.9%    Pressure Injury 10/04/22 Sacrum Medial Stage 1 -  Intact skin with non-blanchable redness of a localized area usually over a bony prominence. (Active)  10/04/22 2011  Location: Sacrum  Location Orientation: Medial  Staging: Stage 1 -  Intact skin with non-blanchable redness of a localized area usually over a bony prominence.  Wound Description (Comments):   DO NOT USE:  Present on Admission:     Principal Diagnosis: Sepsis [facility])  Discharge Diagnoses: Active Hospital Problems   Diagnosis Date Noted   Ulcer of left foot (HCC) 01/17/2024    Priority: 1.   PAD (peripheral artery disease) (HCC) 01/16/2024    Priority: 2.   Goals of care, counseling/discussion 01/31/2024    Priority: 3.   Decubitus ulcers 01/16/2024    Priority: 3.   Dementia without behavioral disturbance (HCC) 08/02/2023    Priority: 3.   Stroke [facility]) 07/01/2023    Priority: 3.   Essential hypertension 08/02/2023   Seizure disorder (HCC) 07/02/2023   Depression 02/27/2023   Controlled type 2 diabetes mellitus without complication, without long-term current use of insulin  (HCC) 10/01/2022    Resolved Hospital Problems   Diagnosis Date Noted Date Resolved   Sepsis [facility]) 01/16/2024 01/25/2024    Priority: 1.   Acute metabolic encephalopathy 08/02/2023 01/25/2024    Priority: 2.     Discharge Instructions     Discharge wound care:   Complete by: As directed    Comfort care   Increase activity slowly   Complete by: As directed       Allergies as of 01/31/2024       Reactions   Amoxicillin Other (See Comments)   Unknown reaction per facility   Ampicillin Other (See Comments)   Unknown reaction per facility   Tetracyclines & Related Rash        Medication List      STOP taking these medications    atorvastatin  80 MG tablet Commonly known as: LIPITOR    ERTAPENEM SODIUM IJ   multivitamin tablet   traMADol  50 MG tablet Commonly known as: ULTRAM        TAKE these medications    acetaminophen  500 MG tablet Commonly known as: TYLENOL  Take 1,000 mg by mouth in the morning and at bedtime.   aspirin  EC 81 MG tablet Take 81 mg by mouth daily. Swallow whole.   Baclofen  5 MG Tabs Take 1 tablet by mouth in the morning, at noon, and at bedtime.   carvedilol  6.25 MG tablet Commonly known as: COREG  Take 1 tablet (6.25 mg total) by mouth 2 (two) times daily with a meal.   clopidogrel  75 MG tablet Commonly known as: PLAVIX  Take 1 tablet (75 mg total) by mouth daily with breakfast. What changed: when to take this   DULoxetine  30 MG capsule Commonly known as: CYMBALTA  Take 30 mg by mouth daily. Depression   levETIRAcetam  750 MG tablet Commonly known as: KEPPRA  Take 2 tablets (1,500 mg total) by mouth 2 (two) times daily.   LORazepam  1 MG tablet Commonly known as: ATIVAN  Take 1 tablet (1 mg total) by mouth 3 (three) times daily as  needed for anxiety (muscle spasms or for comfort). Muscle spasticity What changed:  when to take this reasons to take this   melatonin 3 MG Tabs tablet Take 2 tablets (6 mg total) by mouth at bedtime.   Nutritional Supplement Liqd Take 120 mLs by mouth in the morning and at bedtime.   Oxycodone  HCl 10 MG Tabs Take 1 tablet (10 mg total) by mouth every 4 (four) hours as needed for moderate pain (pain score 4-6), severe pain (pain score 7-10) or breakthrough pain.   pantoprazole  40 MG tablet Commonly known as: PROTONIX  Take 1 tablet (40 mg total) by mouth daily.   PARoxetine  40 MG tablet Commonly known as: PAXIL  Take 40 mg by mouth daily. Mood disorder   polyethylene glycol 17 g packet Commonly known as: MIRALAX  / GLYCOLAX  Take 17 g by mouth daily as needed for mild constipation.   tamsulosin  0.4 MG  Caps capsule Commonly known as: FLOMAX  Take 1 capsule (0.4 mg total) by mouth daily.   valproic  acid 250 MG/5ML solution Commonly known as: DEPAKENE  Take 15 mLs (750 mg total) by mouth 2 (two) times daily.               Discharge Care Instructions  (From admission, onward)           Start     Ordered   01/31/24 0000  Discharge wound care:       Comments: Comfort care   01/31/24 1229            Allergies  Allergen Reactions   Amoxicillin Other (See Comments)    Unknown reaction per facility   Ampicillin Other (See Comments)    Unknown reaction per facility   Tetracyclines & Related Rash    Consultations: Palliative care Vascular surgery  Procedures:   Discharge Exam: BP 128/70 (BP Location: Right Arm)   Pulse 96   Temp 97.9 F (36.6 C)   Resp 18   Ht 5' 9 (1.753 m)   Wt 65.6 kg   SpO2 100%   BMI 21.36 kg/m  Physical Exam Constitutional:      General: He is not in acute distress.    Comments: Chronically ill-appearing adult man appearing older than stated age lying in bed in no distress  HENT:     Head: Normocephalic and atraumatic.     Mouth/Throat:     Mouth: Mucous membranes are moist.  Eyes:     Extraocular Movements: Extraocular movements intact.  Cardiovascular:     Rate and Rhythm: Normal rate and regular rhythm.  Pulmonary:     Effort: Pulmonary effort is normal. No respiratory distress.     Breath sounds: Normal breath sounds. No wheezing.  Abdominal:     General: Bowel sounds are normal. There is no distension.     Palpations: Abdomen is soft.     Tenderness: There is no abdominal tenderness.  Musculoskeletal:     Cervical back: Normal range of motion and neck supple.     Comments: Dressings covering left lower extremity wounds and are tender to palpation  Skin:    General: Skin is warm and dry.  Neurological:     Comments: Slowed mentation and underlying dementia appreciated      The results of significant diagnostics  from this hospitalization (including imaging, microbiology, ancillary and laboratory) are listed below for reference.   Microbiology: No results found for this or any previous visit (from the past 240 hours).   Labs: BNP (last 3  results) No results for input(s): BNP in the last 8760 hours. Basic Metabolic Panel: Recent Labs  Lab 01/26/24 0324 01/27/24 0316 01/28/24 0534 01/29/24 0229 01/30/24 0726  NA 139 141 137 141 140  K 3.9 4.3 3.9 3.9 4.1  CL 109 110 105 107 103  CO2 21* 21* 21* 25 27  GLUCOSE 127* 118* 129* 142* 125*  BUN 18 19 21 17 17   CREATININE 0.93 0.94 0.89 0.92 0.80  CALCIUM  9.1 9.3 9.0 8.8* 9.2  MG 1.8 1.8 1.8 2.0 1.8   Liver Function Tests: No results for input(s): AST, ALT, ALKPHOS, BILITOT, PROT, ALBUMIN in the last 168 hours. No results for input(s): LIPASE, AMYLASE in the last 168 hours. No results for input(s): AMMONIA in the last 168 hours. CBC: Recent Labs  Lab 01/26/24 0324 01/27/24 0316 01/28/24 0534 01/29/24 0229 01/30/24 0726  WBC 15.8* 16.0* 18.8* 17.7* 17.5*  NEUTROABS 9.0* 9.3* 12.3* 10.7* 11.9*  HGB 8.7* 9.0* 8.3* 8.9* 8.9*  HCT 27.4* 27.8* 24.8* 27.5* 29.0*  MCV 91.6 91.1 90.2 90.8 91.2  PLT 671* 695* 651* 692* 706*   Cardiac Enzymes: No results for input(s): CKTOTAL, CKMB, CKMBINDEX, TROPONINI in the last 168 hours. BNP: Invalid input(s): POCBNP CBG: Recent Labs  Lab 01/30/24 1606 01/30/24 2138 01/31/24 0045 01/31/24 0919 01/31/24 1127  GLUCAP 120* 159* 202* 102* 160*   D-Dimer No results for input(s): DDIMER in the last 72 hours. Hgb A1c No results for input(s): HGBA1C in the last 72 hours. Lipid Profile No results for input(s): CHOL, HDL, LDLCALC, TRIG, CHOLHDL, LDLDIRECT in the last 72 hours. Thyroid function studies No results for input(s): TSH, T4TOTAL, T3FREE, THYROIDAB in the last 72 hours.  Invalid input(s): FREET3 Anemia work up No results for input(s):  VITAMINB12, FOLATE, FERRITIN, TIBC, IRON, RETICCTPCT in the last 72 hours. Urinalysis    Component Value Date/Time   COLORURINE YELLOW 01/16/2024 1342   APPEARANCEUR CLEAR 01/16/2024 1342   LABSPEC 1.014 01/16/2024 1342   PHURINE 6.0 01/16/2024 1342   GLUCOSEU NEGATIVE 01/16/2024 1342   HGBUR NEGATIVE 01/16/2024 1342   BILIRUBINUR NEGATIVE 01/16/2024 1342   KETONESUR NEGATIVE 01/16/2024 1342   PROTEINUR NEGATIVE 01/16/2024 1342   NITRITE NEGATIVE 01/16/2024 1342   LEUKOCYTESUR TRACE (A) 01/16/2024 1342   Sepsis Labs Recent Labs  Lab 01/27/24 0316 01/28/24 0534 01/29/24 0229 01/30/24 0726  WBC 16.0* 18.8* 17.7* 17.5*   Microbiology No results found for this or any previous visit (from the past 240 hours).  Procedures/Studies: CT Angio Aortobifemoral W and/or Wo Contrast Result Date: 01/16/2024 CLINICAL DATA:  Left lower extremity ischemia, fever, tremors, dementia EXAM: CT ANGIOGRAPHY OF ABDOMINAL AORTA WITH ILIOFEMORAL RUNOFF TECHNIQUE: Multidetector CT imaging of the abdomen, pelvis and lower extremities was performed using the standard protocol during bolus administration of intravenous contrast. Multiplanar CT image reconstructions and MIPs were obtained to evaluate the vascular anatomy. RADIATION DOSE REDUCTION: This exam was performed according to the departmental dose-optimization program which includes automated exposure control, adjustment of the mA and/or kV according to patient size and/or use of iterative reconstruction technique. CONTRAST:  OMNIPAQUE  IOHEXOL  350 MG/ML SOLN COMPARISON:  None Available. FINDINGS: VASCULAR Aorta: Moderate calcified plaque. No aneurysm, dissection, or stenosis. Celiac: Partially calcified ostial plaque with only mild stenosis, patent distally with classic trifurcation anatomy. SMA: Partially calcified ostial plaque over length of at least 2.1 cm resulting in mild stenosis, patent distally with classic branch anatomy. Renals:  Single bilaterally, both with calcified ostial plaque resulting in short segment  mild stenosis, patent distally. IMA: Patent without evidence of aneurysm, dissection, vasculitis or significant stenosis. RIGHT Lower Extremity Inflow: Common iliac eccentric calcified atheromatous plaque throughout. There is stenosis at its bifurcation extending into the proximal common and internal iliac arteries of at least mild severity. There is a short-segment high-grade stenosis in the mid external iliac artery, which is diffusely atheromatous. Outflow: Common femoral heavily atheromatous, with mild diffuse narrowing. Deep femoral branches patent, atheromatous. SFA calcified plaque proximally with only mild stenosis, patent distally. Popliteal artery scattered calcified plaque above the knee, patent distally. Runoff: Calcified plaque proximally but apparently contiguous distal trifurcation runoff. LEFT Lower Extremity Inflow: Common iliac moderately atheromatous, with stenosis at its bifurcation of at least moderate severity over short segment into the proximal external and internal iliac arteries. Patent stent through the more distal external iliac artery. Outflow: Common femoral moderately atheromatous, patent. Patent stent extends across the SFA origin. There is a distal SFA stent extending into the proximal popliteal artery which is occluded throughout almost its entire length. There is collateral reconstitution of a diminutive native popliteal above the knee, which is patent distally. Runoff: Posterior tibial occludes mid calf. Anterior tibial is patent across the foot as dorsalis pedis. Peroneal is diminutive, with scattered calcified plaque. Veins: No obvious venous abnormality within the limitations of this arterial phase study. Review of the MIP images confirms the above findings. NON-VASCULAR Lower chest: No pleural or pericardial effusion. Coronary and aortic calcifications. Coarse interstitial opacities in the  dependent aspect of both lung bases. Hepatobiliary: Small hyperdensity in the dependent aspect of the nondilated gallbladder suggesting small calcified gallstone. No focal liver lesion or biliary ductal dilatation. Pancreas: Unremarkable. No pancreatic ductal dilatation or surrounding inflammatory changes. Spleen: Normal in size without focal abnormality. Adrenals/Urinary Tract: No adrenal mass. Kidneys symmetric in size with normal enhancement. No hydronephrosis. 1.4 cm probable cortical cyst, left upper pole; no follow-up origin. Urinary bladder is moderately distended with mild circumferential wall thickening and a few small anterior diverticula. Stomach/Bowel: Stomach is nondistended, unremarkable. Small bowel decompressed. Normal appendix. The colon is partially distended, without acute finding Lymphatic: Left inguinal and external iliac lymph nodes up to a 1.2 cm short axis diameter. No mesenteric or retroperitoneal adenopathy. Reproductive: Mild prostate enlargement with central calcifications. Other: No ascites.  No free air. Musculoskeletal: Left hip arthroplasty .  Advanced right hip DJD. IMPRESSION: 1. Occluded left SFA stent extending into the proximal popliteal artery. 2. Collateral reconstitution of a diminutive native left popliteal above the knee, which is patent distally. 3. Occluded left posterior tibial artery to the mid calf. 4. Patent left external iliac artery stent. 5. Short-segment high-grade stenosis mid right external iliac artery. 6. Cholelithiasis. 7. Mild prostate enlargement with mild bladder wall thickening and diverticula. 8.  Aortic Atherosclerosis (ICD10-I70.0). Electronically Signed   By: JONETTA Faes M.D.   On: 01/16/2024 16:18   DG Chest Port 1 View Result Date: 01/16/2024 CLINICAL DATA:  Questionable sepsis.  Fever and tremors. EXAM: PORTABLE CHEST 1 VIEW COMPARISON:  12/13/2023 and CT chest 10/21/2021. FINDINGS: Trachea is midline. Heart size normal. Thoracic aorta is  calcified. Mild linear scarring in the left lower lobe. Lungs are otherwise clear. No pleural fluid. IMPRESSION: No acute findings. Electronically Signed   By: Newell Eke M.D.   On: 01/16/2024 14:29   DG Foot 2 Views Left Result Date: 01/16/2024 CLINICAL DATA:  Sepsis, fever and tremors. EXAM: LEFT FOOT - 2 VIEW COMPARISON:  None Available. FINDINGS: Hallux valgus. Mild degenerative  changes of the first metatarsophalangeal joint. Osteopenia. Small soft tissue wound along the plantar aspect of the posterior calcaneus. No underlying osseous erosion to suggest osteomyelitis. IMPRESSION: 1. No evidence of osteomyelitis. 2. 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 3. Osteopenia. Electronically Signed   By: Newell Eke M.D.   On: 01/16/2024 14:29     Time coordinating discharge: Over 30 minutes    Alm Apo, MD  [facility] 01/31/2024, 12:29 PM

## 2024-01-31 NOTE — TOC Transition Note (Addendum)
 Transition of Care [facility]) - Discharge Note   Patient Details  Name: Jacob Mcgrath. MRN: [id number] Date of Birth: 06-Jan-1956  Transition of Care [facility]) CM/SW Contact:  Jeoffrey LITTIE Moose, LCSW Phone Number: 01/31/2024, 2:20 PM   Clinical Narrative:    Patient [facility]: [facility] Anticipated DC date: 01/31/2024 Family notified: Yes Transport by: [facility]   Per MD patient ready for DC to [facility] . RN to call report prior to discharge [phone] . RN, patient, patient's family, and facility notified of DC. Discharge Summary and FL2 sent to facility. DC packet on chart, including signed scripts & DNR. Ambulance transport requested for patient.   CSW will sign off for now as social work intervention is no longer needed. Please consult us  again if new needs arise.     Final next level of care: Skilled Nursing Facility Barriers to Discharge: Barriers Resolved   Patient Goals and CMS Choice Patient states their goals for this hospitalization and ongoing recovery are:: return back to SNF CMS Medicare.gov Compare Post Acute Care list provided to:: Legal Guardian Choice offered to / list presented to : [facility] POA / Guardian      Discharge Placement   Existing PASRR number confirmed : 01/31/24          Patient [facility] bed at:  [facility]) Patient to be transferred to facility by: [facility] Name of family member notified: Guardian Patient and family notified of of transfer: 01/31/24  Discharge Plan and Services Additional resources added to the After Visit Summary for   In-house Referral: Clinical Social Work, Hospice / Palliative Care   Post Acute Care Choice: Skilled Nursing Facility, Hospice                               Social Drivers of Health (SDOH) Interventions SDOH Screenings   Food Insecurity: Patient Unable To Answer (01/16/2024)  Housing: Patient Unable To Answer (01/16/2024)  Transportation Needs: Patient Unable To Answer (01/16/2024)  Utilities:  Patient Unable To Answer (01/16/2024)  Social Connections: Patient Unable To Answer (01/16/2024)  Tobacco Use: High Risk (01/31/2024)     Readmission Risk Interventions    01/17/2024   10:20 AM 08/01/2023    3:31 PM 03/01/2023    2:40 PM  Readmission Risk Prevention Plan  Transportation Screening Complete Complete Complete  PCP or Specialist Appt within 5-7 Days  Complete Not Complete  Home Care Screening  Complete Complete  Medication Review (RN CM)  Complete Complete  HRI or Home Care Consult Complete    Social Work Consult for Recovery Care Planning/Counseling Complete    Palliative Care Screening Not Applicable    Medication Review Oceanographer) Complete

## 2024-01-31 NOTE — TOC Progression Note (Addendum)
 Transition of Care (TOC) - Progression Note    Patient Details  Name: Jacob Mcgrath. MRN: [id number] Date of Birth: 01-08-1956  Transition of Care [facility]) CM/SW Contact  Inocente GORMAN Kindle, [place] Phone Number: 01/31/2024, 12:12 PM  Clinical Narrative:    [facility] can accept patient back today with Hospice services. Liaison requested hospice orders be on discharge paperwork and facility will arrange the hospice agency once patient arrives. CSW updated patient's Legal Guardian, Jacob Mcgrath, with the [facility] and faxed DC Summary as requested to [phone].    Expected Discharge Plan: Skilled Nursing Facility Barriers to Discharge: Barriers Resolved  Expected Discharge Plan and Services In-house Referral: Clinical Social Work, Hospice / Palliative Care   Post Acute Care Choice: Skilled Nursing Facility, Hospice Living arrangements for the past 2 months: Skilled Nursing Facility                                       Social Determinants of Health (SDOH) Interventions SDOH Screenings   Food Insecurity: Patient Unable To Answer (01/16/2024)  Housing: Patient Unable To Answer (01/16/2024)  Transportation Needs: Patient Unable To Answer (01/16/2024)  Utilities: Patient Unable To Answer (01/16/2024)  Social Connections: Patient Unable To Answer (01/16/2024)  Tobacco Use: High Risk (01/31/2024)    Readmission Risk Interventions    01/17/2024   10:20 AM 08/01/2023    3:31 PM 03/01/2023    2:40 PM  Readmission Risk Prevention Plan  Transportation Screening Complete Complete Complete  PCP or Specialist Appt within 5-7 Days  Complete Not Complete  Home Care Screening  Complete Complete  Medication Review (RN CM)  Complete Complete  HRI or Home Care Consult Complete    Social Work Consult for Recovery Care Planning/Counseling Complete    Palliative Care Screening Not Applicable    Medication Review Oceanographer) Complete

## 2024-01-31 NOTE — Progress Notes (Addendum)
 1342- Called to give report, was forwarded to the nurse. Nurse did not pick up call and voicemail was full. Will try to call back again at a later time    1418- Called back and gave report to the nurse receiving this pt. All questions were answered. Pt's belongings packed and discharge paperwork completed

## 2024-01-31 NOTE — Progress Notes (Signed)
 Daily Progress Note   Patient Name: Jacob Mcgrath.       Date: 01/31/2024 DOB: [birth year]  Age: 68 y.o. MRN#: [id number] Attending Physician: Patsy Lenis, MD Primary Care Physician: [name], MD Admit Date: 01/16/2024  Reason for Consultation/Follow-up: Establishing goals of care  Patient Profile/HPI:   Palliative Care consult requested for goals of care discussion in this 68 y.o. male  with past medical history of  diabetes, hypertension, CVA, dementia, depression, status epilepticus. Recent hospitalization on 5/15-5/23 for status epilepticus requiring intubation for seizure control. He was admitted on 01/16/2024 via EMS from [facility] with fever and tremors. Tmax morning of arrival was 103. Patient given Tylenol  per facility.  Patient recently on 14 day antibiotic for diabetic foot ulceration. During work-up noted to have necrotic areas on feet. Imaging negative for osteomyelitis. Started on IV antibiotics. 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   Subjective: Chart reviewed including labs, progress notes, imaging from this and previous encounters.  On evaluation Jacob Mcgrath is resting comfortably.  Received call from [name]- patient's legal guardian. She and committee and consented for DNR/DNI status and to transition patient to full comfort measures only.  I recommended that he receive hospice care at discharge and she is in agreement with that. Would like patient to return to [facility] with hospice support.  I discussed with Dr. Patsy and social work.   Review of Systems  Unable to perform ROS: Mental status change     Physical Exam Vitals and nursing note reviewed.  Constitutional:      Appearance: He  is ill-appearing.  Cardiovascular:     Rate and Rhythm: Normal rate.  Pulmonary:     Effort: Pulmonary effort is normal.  Neurological:     Mental Status: He is disoriented.             Vital Signs: BP 128/70 (BP Location: Right Arm)   Pulse 96   Temp 97.9 F (36.6 C)   Resp 18   Ht 5' 9 (1.753 m)   Wt 65.6 kg   SpO2 100%   BMI 21.36 kg/m  SpO2: SpO2: 100 % O2 Device: O2 Device: Room Air O2 Flow Rate: O2 Flow Rate (L/min): 2 L/min  Intake/output summary:  Intake/Output Summary (Last 24 hours)  at 01/31/2024 1236 Last data filed at 01/30/2024 2142 Gross per 24 hour  Intake --  Output 500 ml  Net -500 ml   LBM: Last BM Date : 01/24/24 Baseline Weight: Weight: 62.3 kg Most recent weight: Weight: 65.6 kg       Palliative Assessment/Data: PPS: 30%      Patient Active Problem List   Diagnosis Date Noted   Goals of care, counseling/discussion 01/31/2024   Ulcer of left foot (HCC) 01/17/2024   Decubitus ulcers 01/16/2024   PAD (peripheral artery disease) (HCC) 01/16/2024   Malnutrition of moderate degree 12/10/2023   Hypomagnesemia 08/02/2023   Community acquired pneumonia of right lower lobe of lung 08/02/2023   Dementia without behavioral disturbance (HCC) 08/02/2023   Essential hypertension 08/02/2023   Status epilepticus (HCC) 07/29/2023   Seizure disorder (HCC) 07/02/2023   Stroke (HCC) 07/01/2023   Leukocytosis 07/01/2023   Tremor 02/27/2023   GERD (gastroesophageal reflux disease) 02/27/2023   Depression 02/27/2023   Protein-calorie malnutrition, severe 10/07/2022   Ischemic leg 10/01/2022   Hypertension 10/01/2022   Controlled type 2 diabetes mellitus without complication, without long-term current use of insulin  (HCC) 10/01/2022    Palliative Care Assessment & Plan    Assessment/Recommendations/Plan  Sepsis in the setting of PVD with critical limb ischemia- plan for transition to full comfort measures only Code status changed to DNR/DNI-  comfort Stop interventions that are not contributing to comfort and are not changing patient's trajectory Continue current pain medication as appears to be effective TOC order placed for hospice referral  Code Status:   Code Status: Do not attempt resuscitation (DNR) - Comfort care   Prognosis:  Unable to determine  Discharge Planning: To Be Determined  Care plan was discussed with legal guardian- [name], attending team, and vascular surgery  Thank you for allowing the Palliative Medicine Team to assist in the care of this patient.   Prolonged billing:  Time includes:   Preparing to see the patient (e.g., review of tests) Obtaining and/or reviewing separately obtained history Performing a medically necessary appropriate examination and/or evaluation Counseling and educating the patient/family/caregiver Ordering medications, tests, or procedures Referring and communicating with other health care professionals (when not reported separately) Documenting clinical information in the electronic or other health record Independently interpreting results (not reported separately) and communicating results to the patient/family/caregiver Care coordination (not reported separately) Clinical documentation  Cassondra Stain, AGNP-C Palliative Medicine   Please contact Palliative Medicine Team phone at [phone] for questions and concerns.

## 2024-02-24 DEATH — deceased

## 2024-03-06 ENCOUNTER — Ambulatory Visit: Payer: Medicare Other | Admitting: Neurology

## 2024-03-06 ENCOUNTER — Telehealth: Payer: Self-pay | Admitting: Neurology

## 2024-03-06 NOTE — Telephone Encounter (Signed)
 Pt was not at appt today due to the pt passing away February 10, 2024

## 2024-03-06 NOTE — Telephone Encounter (Signed)
 Dana, fyi
